# Patient Record
Sex: Female | Born: 1945
Health system: Southern US, Community
[De-identification: ages and names within clinical notes are randomized; demographics above are authoritative.]

## PROBLEM LIST (undated history)

## (undated) DIAGNOSIS — J189 Pneumonia, unspecified organism: Secondary | ICD-10-CM

## (undated) DIAGNOSIS — G709 Myoneural disorder, unspecified: Secondary | ICD-10-CM

## (undated) DIAGNOSIS — M1990 Unspecified osteoarthritis, unspecified site: Secondary | ICD-10-CM

## (undated) DIAGNOSIS — J984 Other disorders of lung: Secondary | ICD-10-CM

## (undated) DIAGNOSIS — R519 Headache, unspecified: Secondary | ICD-10-CM

## (undated) DIAGNOSIS — F419 Anxiety disorder, unspecified: Secondary | ICD-10-CM

## (undated) DIAGNOSIS — Z8489 Family history of other specified conditions: Secondary | ICD-10-CM

## (undated) DIAGNOSIS — F32A Depression, unspecified: Secondary | ICD-10-CM

## (undated) DIAGNOSIS — Z973 Presence of spectacles and contact lenses: Secondary | ICD-10-CM

## (undated) DIAGNOSIS — T8859XA Other complications of anesthesia, initial encounter: Secondary | ICD-10-CM

## (undated) DIAGNOSIS — R51 Headache: Secondary | ICD-10-CM

## (undated) DIAGNOSIS — T7840XA Allergy, unspecified, initial encounter: Secondary | ICD-10-CM

## (undated) DIAGNOSIS — J449 Chronic obstructive pulmonary disease, unspecified: Secondary | ICD-10-CM

## (undated) DIAGNOSIS — S22009A Unspecified fracture of unspecified thoracic vertebra, initial encounter for closed fracture: Secondary | ICD-10-CM

## (undated) DIAGNOSIS — T4145XA Adverse effect of unspecified anesthetic, initial encounter: Secondary | ICD-10-CM

## (undated) DIAGNOSIS — F329 Major depressive disorder, single episode, unspecified: Secondary | ICD-10-CM

## (undated) DIAGNOSIS — K08109 Complete loss of teeth, unspecified cause, unspecified class: Secondary | ICD-10-CM

## (undated) DIAGNOSIS — Z972 Presence of dental prosthetic device (complete) (partial): Secondary | ICD-10-CM

## (undated) DIAGNOSIS — C801 Malignant (primary) neoplasm, unspecified: Secondary | ICD-10-CM

## (undated) HISTORY — DX: Allergy, unspecified, initial encounter: T78.40XA

## (undated) HISTORY — PX: OTHER SURGICAL HISTORY: SHX169

## (undated) HISTORY — DX: Depression, unspecified: F32.A

## (undated) HISTORY — PX: ELBOW SURGERY: SHX618

## (undated) HISTORY — PX: TUBAL LIGATION: SHX77

## (undated) HISTORY — DX: Unspecified osteoarthritis, unspecified site: M19.90

## (undated) HISTORY — DX: Anxiety disorder, unspecified: F41.9

## (undated) HISTORY — DX: Myoneural disorder, unspecified: G70.9

## (undated) HISTORY — PX: MULTIPLE TOOTH EXTRACTIONS: SHX2053

## (undated) HISTORY — DX: Major depressive disorder, single episode, unspecified: F32.9

---

## 1997-08-20 ENCOUNTER — Emergency Department (HOSPITAL_COMMUNITY): Admission: EM | Admit: 1997-08-20 | Discharge: 1997-08-20 | Payer: Self-pay

## 1997-09-21 ENCOUNTER — Encounter: Admission: RE | Admit: 1997-09-21 | Discharge: 1997-12-20 | Payer: Self-pay | Admitting: Specialist

## 1997-10-20 ENCOUNTER — Ambulatory Visit (HOSPITAL_COMMUNITY): Admission: RE | Admit: 1997-10-20 | Discharge: 1997-10-20 | Payer: Self-pay

## 1998-02-15 ENCOUNTER — Ambulatory Visit (HOSPITAL_COMMUNITY): Admission: RE | Admit: 1998-02-15 | Discharge: 1998-02-15 | Payer: Self-pay | Admitting: Specialist

## 1999-06-19 ENCOUNTER — Emergency Department (HOSPITAL_COMMUNITY): Admission: EM | Admit: 1999-06-19 | Discharge: 1999-06-19 | Payer: Self-pay | Admitting: Emergency Medicine

## 2001-10-18 ENCOUNTER — Emergency Department (HOSPITAL_COMMUNITY): Admission: EM | Admit: 2001-10-18 | Discharge: 2001-10-18 | Payer: Self-pay | Admitting: Emergency Medicine

## 2011-07-10 ENCOUNTER — Other Ambulatory Visit: Payer: Self-pay | Admitting: Specialist

## 2011-07-10 ENCOUNTER — Ambulatory Visit
Admission: RE | Admit: 2011-07-10 | Discharge: 2011-07-10 | Disposition: A | Discharge status: Home / Self Care | Payer: Self-pay | Source: Ambulatory Visit | Attending: Specialist | Admitting: Specialist

## 2011-07-10 DIAGNOSIS — W19XXXA Unspecified fall, initial encounter: Secondary | ICD-10-CM

## 2012-04-23 ENCOUNTER — Encounter (HOSPITAL_COMMUNITY): Payer: Self-pay | Admitting: *Deleted

## 2012-04-23 ENCOUNTER — Ambulatory Visit (INDEPENDENT_AMBULATORY_CARE_PROVIDER_SITE_OTHER): Payer: Medicare Other | Admitting: Family Medicine

## 2012-04-23 ENCOUNTER — Ambulatory Visit: Payer: Medicare Other

## 2012-04-23 ENCOUNTER — Emergency Department (HOSPITAL_COMMUNITY)
Admission: EM | Admit: 2012-04-23 | Discharge: 2012-04-23 | Disposition: A | Discharge status: Home / Self Care | Payer: Medicare Other | Attending: Emergency Medicine | Admitting: Emergency Medicine

## 2012-04-23 VITALS — BP 116/71 | HR 125 | Temp 98.6°F | Resp 30 | Ht 61.0 in | Wt 84.0 lb

## 2012-04-23 DIAGNOSIS — J189 Pneumonia, unspecified organism: Secondary | ICD-10-CM | POA: Insufficient documentation

## 2012-04-23 DIAGNOSIS — R6889 Other general symptoms and signs: Secondary | ICD-10-CM | POA: Insufficient documentation

## 2012-04-23 DIAGNOSIS — R05 Cough: Secondary | ICD-10-CM

## 2012-04-23 DIAGNOSIS — J3489 Other specified disorders of nose and nasal sinuses: Secondary | ICD-10-CM | POA: Insufficient documentation

## 2012-04-23 DIAGNOSIS — E876 Hypokalemia: Secondary | ICD-10-CM | POA: Insufficient documentation

## 2012-04-23 DIAGNOSIS — Z8669 Personal history of other diseases of the nervous system and sense organs: Secondary | ICD-10-CM | POA: Insufficient documentation

## 2012-04-23 DIAGNOSIS — Z8659 Personal history of other mental and behavioral disorders: Secondary | ICD-10-CM | POA: Insufficient documentation

## 2012-04-23 DIAGNOSIS — F17200 Nicotine dependence, unspecified, uncomplicated: Secondary | ICD-10-CM | POA: Insufficient documentation

## 2012-04-23 DIAGNOSIS — Z8739 Personal history of other diseases of the musculoskeletal system and connective tissue: Secondary | ICD-10-CM | POA: Insufficient documentation

## 2012-04-23 LAB — POCT I-STAT, CHEM 8
Calcium, Ion: 1.1 mmol/L — ABNORMAL LOW (ref 1.13–1.30)
HCT: 41 % (ref 36.0–46.0)
Hemoglobin: 13.9 g/dL (ref 12.0–15.0)
Sodium: 138 mEq/L (ref 135–145)
TCO2: 30 mmol/L (ref 0–100)

## 2012-04-23 LAB — POCT CBC
Granulocyte percent: 80.4 %G — AB (ref 37–80)
Hemoglobin: 12.7 g/dL (ref 12.2–16.2)
MCH, POC: 28.3 pg (ref 27–31.2)
MCV: 91.9 fL (ref 80–97)
MID (cbc): 0.7 (ref 0–0.9)
MPV: 9.8 fL (ref 0–99.8)
POC MID %: 6.1 %M (ref 0–12)
Platelet Count, POC: 348 10*3/uL (ref 142–424)
RBC: 4.48 M/uL (ref 4.04–5.48)
WBC: 11.7 10*3/uL — AB (ref 4.6–10.2)

## 2012-04-23 MED ORDER — ALBUTEROL SULFATE (2.5 MG/3ML) 0.083% IN NEBU
2.5000 mg | INHALATION_SOLUTION | Freq: Once | RESPIRATORY_TRACT | Status: AC
  Administered 2012-04-23: 2.5 mg via RESPIRATORY_TRACT

## 2012-04-23 MED ORDER — POTASSIUM CHLORIDE ER 10 MEQ PO TBCR: 10.0000 meq | EXTENDED_RELEASE_TABLET | Freq: Two times a day (BID) | ORAL | Status: DC

## 2012-04-23 MED ORDER — AZITHROMYCIN 200 MG/5ML PO SUSR: 200.0000 mg | Freq: Every day | ORAL | Status: DC

## 2012-04-23 MED ORDER — POTASSIUM CHLORIDE CRYS ER 20 MEQ PO TBCR
30.0000 meq | EXTENDED_RELEASE_TABLET | Freq: Once | ORAL | Status: AC
  Administered 2012-04-23: 30 meq via ORAL
  Filled 2012-04-23: qty 2

## 2012-04-23 MED ORDER — AZITHROMYCIN 250 MG PO TABS
500.0000 mg | ORAL_TABLET | Freq: Once | ORAL | Status: AC
  Administered 2012-04-23: 500 mg via ORAL
  Filled 2012-04-23: qty 2

## 2012-04-23 MED ORDER — IPRATROPIUM BROMIDE 0.02 % IN SOLN
0.5000 mg | Freq: Once | RESPIRATORY_TRACT | Status: AC
  Administered 2012-04-23: 0.5 mg via RESPIRATORY_TRACT

## 2012-04-23 NOTE — Addendum Note (Signed)
Addended by: Clydia Llano on: 04/23/2012 08:23 PM   Modules accepted: Orders

## 2012-04-23 NOTE — ED Provider Notes (Signed)
History     CSN: [id number]  Arrival date & time 04/23/12  2029   First MD Initiated Contact with Patient 04/23/12 2135      Chief Complaint  Patient presents with  . Cough  . Pneumonia     HPI Pt states she was sent here by Urgent care d/t possible pneumonia, pt states since last Thursday has had cough, congestion, runny nose and eyes. Pt states coughing up foamy/sticky mucus.no fever chills nausea or vomiting.         Past Medical History  Diagnosis Date  . Allergy   . Arthritis   . Depression   . Neuromuscular disorder   . Anxiety     Past Surgical History  Procedure Laterality Date  . Tubal ligation      Family History  Problem Relation Age of Onset  . Diabetes Mother     History  Substance Use Topics  . Smoking status: Current Every Day Smoker -- 1.00 packs/day for 50 years    Types: Cigarettes  . Smokeless tobacco: Never Used  . Alcohol Use: No    OB History   Grav Para Term Preterm Abortions TAB SAB Ect Mult Living                  Review of Systems  All other systems reviewed and are negative.    Allergies  Penicillins  Home Medications   Current Outpatient Rx  Name  Route  Sig  Dispense  Refill  . carbamide peroxide (EAR DROPS EARWAX AID) 6.5 % otic solution   Both Ears   Place 5 drops into both ears as needed (Cerumen removal).         . diphenhydrAMINE (BENADRYL) 25 MG tablet   Oral   Take 25 mg by mouth every 6 (six) hours as needed for allergies.         Marland Kitchen tetrahydrozoline-zinc (VISINE-AC) 0.05-0.25 % ophthalmic solution   Both Eyes   Place 2 drops into both eyes 3 (three) times daily as needed.         Marland Kitchen azithromycin (ZITHROMAX) 200 MG/5ML suspension   Oral   Take 5 mLs (200 mg total) by mouth daily.   22.5 mL   0   . potassium chloride (K-DUR) 10 MEQ tablet   Oral   Take 1 tablet (10 mEq total) by mouth 2 (two) times daily.   30 tablet   0     BP 112/54  Pulse 113  Temp(Src) 98.1 F (36.7 C) (Oral)   Resp 20  Ht 5\' 1"  (1.549 m)  Wt 84 lb (38.102 kg)  BMI 15.88 kg/m2  SpO2 94%  Physical Exam  Nursing note and vitals reviewed. Constitutional: She is oriented to person, place, and time. She appears well-developed and well-nourished. No distress.  HENT:  Head: Normocephalic and atraumatic.  Eyes: Pupils are equal, round, and reactive to light.  Neck: Normal range of motion.  Cardiovascular: Normal rate and intact distal pulses.   Pulmonary/Chest: Effort normal. No respiratory distress. She has no wheezes (Scattered rhonchi).  Abdominal: Normal appearance. She exhibits no distension.  Musculoskeletal: Normal range of motion.  Neurological: She is alert and oriented to person, place, and time. No cranial nerve deficit.  Skin: Skin is warm and dry. No rash noted.  Psychiatric: She has a normal mood and affect. Her behavior is normal.    ED Course  Procedures (including critical care time)  Date: 04/23/2012  Rate: 103  Rhythm: normal  sinus rhythm  QRS Axis: normal  Intervals: normal  ST/T Wave abnormalities: normal  Conduction Disutrbances: none  Narrative Interpretation: No significant prolonged QT  Meds ordered this encounter  Medications  . potassium chloride (K-DUR) 10 MEQ tablet    Sig: Take 1 tablet (10 mEq total) by mouth 2 (two) times daily.    Dispense:  30 tablet    Refill:  0  . azithromycin (ZITHROMAX) tablet 500 mg    Sig:       Labs Reviewed  POCT I-STAT, CHEM 8 - Abnormal; Notable for the following:    Potassium 2.7 (*)    Glucose, Bld 112 (*)    Calcium, Ion 1.10 (*)    All other components within normal limits   Dg Chest 2 View  04/23/2012  *RADIOLOGY REPORT*  Clinical Data: Fever, cough, and 20 pounds unexplained weight loss.  CHEST - 2 VIEW  Comparison: None.  Findings: There is a vague area of faint density at the right lung base laterally which may represent a small area of infiltrate.  Heart size and vascularity are normal.  The lungs are  hyperinflated and there is diffuse accentuation of the interstitial markings consistent with chronic interstitial and obstructive lung disease. No acute osseous abnormality.  No visible mediastinal or hilar adenopathy.    IMPRESSION:  1.  Small area of possible infiltrate at the right lung base laterally. 2.  Extensive chronic interstitial and obstructive lung disease.  Clinically significant discrepancy from primary report, if provided: None   Original Report Authenticated By: Francene Boyers, M.D.      1. CAP (community acquired pneumonia)   2. Hypokalemia       MDM  CURB 65 score = 1 Discussed with the patient the need for followup and repeat chest x-ray and repeat potassium       Nelia Shi, MD 04/23/12 2311

## 2012-04-23 NOTE — Progress Notes (Signed)
Sherry [name] is a 67 y.o. female who presents to [facility] today for 5 days of cough chills fever. Additionally patient is a 10 pound unintentional weight loss over the past several months. She notes additionally tachypnea. She does not have any chronic medical problems to her knowledge however she is not a frequent doctor goer. She has an extensive over 50 pack year smoking history.  She denies any nausea vomiting or diarrhea and feels well otherwise. She notes that she frequently gets bronchitis and allergy symptoms in the fall and in the spring and she feels that this symptom is consistent with prior episodes of bronchitis.  PMH: Reviewed long-term smoker with unintentional weight loss History  Substance Use Topics  . Smoking status: Current Every Day Smoker -- 1.00 packs/day for 50 years    Types: Cigarettes  . Smokeless tobacco: Never Used  . Alcohol Use: No   ROS as above  Medications reviewed. No current outpatient prescriptions on file.   No current facility-administered medications for this visit.    Exam:  BP 116/71  Pulse 125  Temp(Src) 98.6 F (37 C) (Oral)  Resp 30  Ht 5\' 1"  (1.549 m)  Wt 84 lb (38.102 kg)  BMI 15.88 kg/m2  SpO2 94% Gen: Well NAD HEENT: EOMI,  MMM Lungs: Increased work of breathing and respiratory rate. Rales in the right lower lung rhonchi bilaterally.  Heart: Regular but tachycardic rate no MRG Abd: NABS, NT, ND Exts: Non edematous BL  LE, warm and well perfused.   Following albuterol and Atrovent nebulizer treatment: Lungs continue to have coarseness with Rales in the right lower lung. Oxygen saturation remained in the low 90s.   Results for orders placed in visit on 04/23/12 (from the past 72 hour(s))  POCT CBC     Status: Abnormal   Collection Time    04/23/12  7:38 PM      Result Value Range   WBC 11.7 (*) 4.6 - 10.2 K/uL   Lymph, poc 1.6  0.6 - 3.4   POC LYMPH PERCENT 13.5  10 - 50 %L   MID (cbc) 0.7  0 - 0.9   POC MID % 6.1  0 - 12 %M   POC Granulocyte 9.8 (*) 2 - 6.9   Granulocyte percent 80.4 (*) 37 - 80 %G   RBC 4.48  4.04 - 5.48 M/uL   Hemoglobin 12.7  12.2 - 16.2 g/dL   HCT, POC 16.1  09.6 - 47.9 %   MCV 91.9  80 - 97 fL   MCH, POC 28.3  27 - 31.2 pg   MCHC 30.8 (*) 31.8 - 35.4 g/dL   RDW, POC 04.5     Platelet Count, POC 348  142 - 424 K/uL   MPV 9.8  0 - 99.8 fL    X-ray ordered and preliminary results by me: Diffuse interstitial changes consistent with COPD.  Increased lung markings likely infiltrate in the right lower lung.  No masses noted  Assessment and Plan: 67 y.o. female with history of undiagnosed COPD with a 10 pound unintentional weight loss fever cough chills tachypnea and tachycardia. X-ray shows right lower lobe infiltrate and CBC shows mildly increased white count. Patient clinically has pneumonia in the setting of COPD. I'm concerned about occult malignancy. We'll transfer patient to the emergency room for further evaluation and management of her pneumonia and she is at higher risk due to her underlying health issues.  Discussed the plan with the patient and her  sister who expressed understanding and agreement. They will go directly to the emergency room.

## 2012-04-23 NOTE — ED Notes (Signed)
Pt states she was sent here by Urgent care d/t possible pneumonia, pt states since last Thursday has had cough, congestion, runny nose and eyes. Pt states coughing up foamy/sticky mucus.

## 2012-04-23 NOTE — Patient Instructions (Addendum)
Thank you for coming in today. Please go directly to the emergency room at [facility].  We are calling ahead about you.  I am worried that you have pneumonia.

## 2012-04-29 ENCOUNTER — Encounter (HOSPITAL_COMMUNITY): Payer: Self-pay | Admitting: *Deleted

## 2012-04-29 ENCOUNTER — Emergency Department (HOSPITAL_COMMUNITY): Payer: Medicare Other

## 2012-04-29 ENCOUNTER — Emergency Department (HOSPITAL_COMMUNITY)
Admission: EM | Admit: 2012-04-29 | Discharge: 2012-04-29 | Disposition: A | Discharge status: Home / Self Care | Payer: Medicare Other | Attending: Emergency Medicine | Admitting: Emergency Medicine

## 2012-04-29 DIAGNOSIS — J449 Chronic obstructive pulmonary disease, unspecified: Secondary | ICD-10-CM | POA: Insufficient documentation

## 2012-04-29 DIAGNOSIS — Z8739 Personal history of other diseases of the musculoskeletal system and connective tissue: Secondary | ICD-10-CM | POA: Insufficient documentation

## 2012-04-29 DIAGNOSIS — F17200 Nicotine dependence, unspecified, uncomplicated: Secondary | ICD-10-CM | POA: Insufficient documentation

## 2012-04-29 DIAGNOSIS — Z8659 Personal history of other mental and behavioral disorders: Secondary | ICD-10-CM | POA: Insufficient documentation

## 2012-04-29 DIAGNOSIS — J4489 Other specified chronic obstructive pulmonary disease: Secondary | ICD-10-CM | POA: Insufficient documentation

## 2012-04-29 DIAGNOSIS — Z8669 Personal history of other diseases of the nervous system and sense organs: Secondary | ICD-10-CM | POA: Insufficient documentation

## 2012-04-29 LAB — BASIC METABOLIC PANEL
Calcium: 9.4 mg/dL (ref 8.4–10.5)
Creatinine, Ser: 0.68 mg/dL (ref 0.50–1.10)
GFR calc Af Amer: >90 mL/min (ref >90)
GFR calc non Af Amer: 89 mL/min — ABNORMAL LOW (ref >90)

## 2012-04-29 NOTE — ED Provider Notes (Signed)
History    This chart was scribed for non-physician practitioner Wynetta Emery, PA-C working with Celene Kras, MD by Gerlean Ren, ED Scribe. This patient was seen in room Sherry [name]/Sherry [name] and the patient's care was started at 6:51 PM.    CSN: [id number]  Arrival date & time 04/29/12  1656   First MD Initiated Contact with Patient 04/29/12 1756      Chief Complaint  Patient presents with  . Follow-up    The history is provided by the patient. No language interpreter was used.  Sherry [name] is a 67 y.o. female who presents to the Emergency Department for a re-check of pneumonia diagnosed 04/15 when she was also found to have low potassium.  Pt reports she is feeling significantly improved compared to last visit with improved cough that is still productive of white phlegm.  Pt denies any current chest pain, dyspnea, wheezes, nausea, emesis.  Pt completed her antibiotic treatment yesterday.  Pt reports some non-bloody diarrhea over the past 2 days that she thinks may be due to antibiotics.  She finished her Z-Pak yesterday. Pt reports she has not smoked since being diagnosed and that she has quit.    Past Medical History  Diagnosis Date  . Allergy   . Arthritis   . Depression   . Neuromuscular disorder   . Anxiety     Past Surgical History  Procedure Laterality Date  . Tubal ligation      Family History  Problem Relation Age of Onset  . Diabetes Mother     History  Substance Use Topics  . Smoking status: Current Every Day Smoker -- 1.00 packs/day for 50 years    Types: Cigarettes  . Smokeless tobacco: Never Used  . Alcohol Use: No    No OB history provided.   Review of Systems  Constitutional: Negative for fever.  Respiratory: Positive for cough. Negative for shortness of breath.   Cardiovascular: Negative for chest pain.  Gastrointestinal: Positive for diarrhea (resolved). Negative for nausea, vomiting and abdominal pain.  All other systems reviewed and are  negative.    Allergies  Penicillins  Home Medications   Current Outpatient Rx  Name  Route  Sig  Dispense  Refill  . potassium chloride (K-DUR) 10 MEQ tablet   Oral   Take 10 mEq by mouth 2 (two) times daily.         Marland Kitchen tetrahydrozoline-zinc (VISINE-AC) 0.05-0.25 % ophthalmic solution   Both Eyes   Place 2 drops into both eyes 3 (three) times daily as needed (dry eyes/ red eyes).            BP 111/44  Pulse 65  Temp(Src) 98.3 F (36.8 C) (Oral)  Resp 18  Ht 5\' 1"  (1.549 m)  Wt 84 lb (38.102 kg)  BMI 15.88 kg/m2  SpO2 99%  Physical Exam  Nursing note and vitals reviewed. Constitutional: She is oriented to person, place, and time. She appears well-developed and well-nourished. No distress.  HENT:  Head: Normocephalic.  Mouth/Throat: Oropharynx is clear and moist.  Eyes: Conjunctivae and EOM are normal. Pupils are equal, round, and reactive to light.  Neck: Normal range of motion.  Cardiovascular: Normal rate, regular rhythm and intact distal pulses.   Pulmonary/Chest: Effort normal. No stridor. No respiratory distress. She has wheezes. She has no rales. She exhibits no tenderness.  Mild scattered expiratory wheezing, prolonged exhalation  Abdominal: Soft. Bowel sounds are normal. She exhibits no distension and no mass. There is no  tenderness. There is no rebound and no guarding.  Musculoskeletal: Normal range of motion.  Neurological: She is alert and oriented to person, place, and time.  Psychiatric: She has a normal mood and affect.    ED Course  Procedures (including critical care time) DIAGNOSTIC STUDIES: Oxygen Saturation is 99% on room air, normal by my interpretation.    COORDINATION OF CARE: 7:00 PM- Patient informed of clinical course, understands medical decision-making process, and agrees with plan.    Dg Chest 2 View  04/29/2012  *RADIOLOGY REPORT*  Clinical Data: Cough.  Shortness of breath.  Pneumonia.  CHEST - 2 VIEW  Comparison: 04/23/2012   Findings: There is mild persistent infiltrate in the lateral right lung base which shows no significant change since recent study.  The left lung is clear.  No evidence of pleural effusion. Pulmonary hyperinflation is seen, consistent with COPD.  Heart size is normal.  No mass or lymphadenopathy identified.  IMPRESSION:  1.  No significant change in mild infiltrate and lateral right lung base, suspicious for pneumonia.  Continued radiographic followup is recommended to confirm resolution. 2.  COPD.   Original Report Authenticated By: Myles Rosenthal, M.D.      1. COPD (chronic obstructive pulmonary disease)       MDM   [name] is a 67 y.o. female presenting for repeat chest x-ray and repeat potassium.   Interval be met shows normalization of potassium of 4.3. Chest x-ray does show no change in the infiltrate noted in the right lung. I discussed this with attending Dr. Lynelle Doctor who feels that she is appropriate for discharge and that the clinical scenario of significant subjective improvement, completion of antibiotic course is reassuring and that the x-ray will lack in resolution of the pneumonia.  Advise patient is critically important that she establish primary care for management of her chronic issue such as COPD. Advised to return to the emergency room for any change in cough or sputum production, fever, shortness of breath, abdominal pain, nausea vomiting.  Filed Vitals:   04/29/12 1725  BP: 111/44  Pulse: 65  Temp: 98.3 F (36.8 C)  TempSrc: Oral  Resp: 18  Height: 5\' 1"  (1.549 m)  Weight: 84 lb (38.102 kg)  SpO2: 99%     Pt verbalized understanding and agrees with care plan. Outpatient follow-up and return precautions given.    I personally performed the services described in this documentation, which was scribed in my presence. The recorded information has been reviewed and is accurate.     Wynetta Emery, PA-C 04/30/12 0147

## 2012-04-29 NOTE — ED Notes (Signed)
Pt states was here last Tuesday, told R sided pna and low potassium, given potassium pills and azithromycin, was told to come back for a follow up to check her potassium level. Pt states feeling better.

## 2012-04-30 NOTE — ED Provider Notes (Signed)
Medical screening examination/treatment/procedure(s) were performed by non-physician practitioner and as supervising physician I was immediately available for consultation/collaboration.    Celene Kras, MD 04/30/12 [phone]

## 2012-05-28 DIAGNOSIS — G43909 Migraine, unspecified, not intractable, without status migrainosus: Secondary | ICD-10-CM | POA: Insufficient documentation

## 2012-05-28 DIAGNOSIS — F329 Major depressive disorder, single episode, unspecified: Secondary | ICD-10-CM | POA: Insufficient documentation

## 2012-09-20 DIAGNOSIS — M810 Age-related osteoporosis without current pathological fracture: Secondary | ICD-10-CM | POA: Insufficient documentation

## 2013-02-28 ENCOUNTER — Ambulatory Visit
Admission: RE | Admit: 2013-02-28 | Discharge: 2013-02-28 | Disposition: A | Discharge status: Home / Self Care | Payer: Medicare Other | Source: Ambulatory Visit | Attending: Nurse Practitioner | Admitting: Nurse Practitioner

## 2013-02-28 ENCOUNTER — Other Ambulatory Visit: Payer: Self-pay | Admitting: Nurse Practitioner

## 2013-02-28 DIAGNOSIS — R05 Cough: Secondary | ICD-10-CM

## 2013-02-28 DIAGNOSIS — R059 Cough, unspecified: Secondary | ICD-10-CM

## 2013-03-10 DIAGNOSIS — J441 Chronic obstructive pulmonary disease with (acute) exacerbation: Secondary | ICD-10-CM | POA: Insufficient documentation

## 2013-03-10 DIAGNOSIS — J449 Chronic obstructive pulmonary disease, unspecified: Secondary | ICD-10-CM | POA: Insufficient documentation

## 2013-12-12 ENCOUNTER — Encounter: Payer: Self-pay | Admitting: Gastroenterology

## 2014-01-06 ENCOUNTER — Institutional Professional Consult (permissible substitution): Payer: Medicare Other | Admitting: Internal Medicine

## 2014-02-13 ENCOUNTER — Encounter: Payer: Medicare Other | Admitting: Gastroenterology

## 2014-03-26 ENCOUNTER — Encounter: Payer: Self-pay | Admitting: Nurse Practitioner

## 2015-02-25 DIAGNOSIS — H25011 Cortical age-related cataract, right eye: Secondary | ICD-10-CM | POA: Diagnosis not present

## 2015-02-25 DIAGNOSIS — H2512 Age-related nuclear cataract, left eye: Secondary | ICD-10-CM | POA: Diagnosis not present

## 2015-02-25 DIAGNOSIS — H2511 Age-related nuclear cataract, right eye: Secondary | ICD-10-CM | POA: Diagnosis not present

## 2015-02-25 DIAGNOSIS — H25012 Cortical age-related cataract, left eye: Secondary | ICD-10-CM | POA: Diagnosis not present

## 2015-10-22 ENCOUNTER — Ambulatory Visit
Admission: RE | Admit: 2015-10-22 | Discharge: 2015-10-22 | Disposition: A | Discharge status: Home / Self Care | Payer: Medicaid Other | Source: Ambulatory Visit | Attending: Radiation Oncology | Admitting: Radiation Oncology

## 2015-10-22 NOTE — Progress Notes (Addendum)
Error wrong chart

## 2015-12-17 DIAGNOSIS — Z0000 Encounter for general adult medical examination without abnormal findings: Secondary | ICD-10-CM | POA: Diagnosis not present

## 2015-12-17 DIAGNOSIS — R636 Underweight: Secondary | ICD-10-CM | POA: Diagnosis not present

## 2015-12-17 DIAGNOSIS — J42 Unspecified chronic bronchitis: Secondary | ICD-10-CM | POA: Diagnosis not present

## 2015-12-17 DIAGNOSIS — M810 Age-related osteoporosis without current pathological fracture: Secondary | ICD-10-CM | POA: Diagnosis not present

## 2015-12-17 DIAGNOSIS — Z1231 Encounter for screening mammogram for malignant neoplasm of breast: Secondary | ICD-10-CM | POA: Diagnosis not present

## 2015-12-17 DIAGNOSIS — Z1159 Encounter for screening for other viral diseases: Secondary | ICD-10-CM | POA: Diagnosis not present

## 2015-12-17 DIAGNOSIS — B351 Tinea unguium: Secondary | ICD-10-CM | POA: Diagnosis not present

## 2015-12-17 DIAGNOSIS — F17200 Nicotine dependence, unspecified, uncomplicated: Secondary | ICD-10-CM | POA: Diagnosis not present

## 2015-12-29 ENCOUNTER — Ambulatory Visit (INDEPENDENT_AMBULATORY_CARE_PROVIDER_SITE_OTHER): Payer: PPO | Admitting: Physician Assistant

## 2015-12-29 DIAGNOSIS — M62838 Other muscle spasm: Secondary | ICD-10-CM

## 2015-12-29 DIAGNOSIS — M542 Cervicalgia: Secondary | ICD-10-CM

## 2015-12-29 DIAGNOSIS — R0789 Other chest pain: Secondary | ICD-10-CM

## 2015-12-29 MED ORDER — MELOXICAM 7.5 MG PO TABS: 7.5000 mg | ORAL_TABLET | Freq: Every day | ORAL | 0 refills | Status: DC
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
## 2015-12-29 NOTE — Patient Instructions (Addendum)
  Heat to the area Medications as needed  Take good deep breaths to prevent penumonia  IF you received an x-ray today, you will receive an invoice from [facility]. Please contact [facility] at [phone] with questions or concerns regarding your invoice.   IF you received labwork today, you will receive an invoice from [facility]. Please contact [facility] at [phone] with questions or concerns regarding your invoice.   Our billing staff will not be able to assist you with questions regarding bills from these companies.  You will be contacted with the lab results as soon as they are available. The fastest way to get your results is to activate your My Chart account. Instructions are located on the last page of this paperwork. If you have not heard from [place] regarding the results in 2 weeks, please contact this office.

## 2015-12-30 ENCOUNTER — Other Ambulatory Visit: Payer: Self-pay | Admitting: Nurse Practitioner

## 2015-12-30 DIAGNOSIS — M810 Age-related osteoporosis without current pathological fracture: Secondary | ICD-10-CM

## 2015-12-30 DIAGNOSIS — Z1231 Encounter for screening mammogram for malignant neoplasm of breast: Secondary | ICD-10-CM

## 2016-01-14 ENCOUNTER — Other Ambulatory Visit (HOSPITAL_COMMUNITY): Payer: Self-pay | Admitting: Respiratory Therapy

## 2016-01-14 DIAGNOSIS — J441 Chronic obstructive pulmonary disease with (acute) exacerbation: Secondary | ICD-10-CM

## 2017-06-01 DIAGNOSIS — M810 Age-related osteoporosis without current pathological fracture: Secondary | ICD-10-CM | POA: Diagnosis not present

## 2017-06-01 DIAGNOSIS — J410 Simple chronic bronchitis: Secondary | ICD-10-CM | POA: Diagnosis not present

## 2017-06-01 DIAGNOSIS — F17210 Nicotine dependence, cigarettes, uncomplicated: Secondary | ICD-10-CM | POA: Diagnosis not present

## 2017-06-01 DIAGNOSIS — R636 Underweight: Secondary | ICD-10-CM | POA: Diagnosis not present

## 2017-06-01 DIAGNOSIS — F4323 Adjustment disorder with mixed anxiety and depressed mood: Secondary | ICD-10-CM | POA: Diagnosis not present

## 2017-06-01 DIAGNOSIS — E538 Deficiency of other specified B group vitamins: Secondary | ICD-10-CM | POA: Diagnosis not present

## 2017-06-01 DIAGNOSIS — Z0000 Encounter for general adult medical examination without abnormal findings: Secondary | ICD-10-CM | POA: Diagnosis not present

## 2017-06-08 DIAGNOSIS — R636 Underweight: Secondary | ICD-10-CM | POA: Diagnosis not present

## 2017-06-08 DIAGNOSIS — E538 Deficiency of other specified B group vitamins: Secondary | ICD-10-CM | POA: Diagnosis not present

## 2017-06-08 DIAGNOSIS — F17210 Nicotine dependence, cigarettes, uncomplicated: Secondary | ICD-10-CM | POA: Diagnosis not present

## 2017-06-08 DIAGNOSIS — F17200 Nicotine dependence, unspecified, uncomplicated: Secondary | ICD-10-CM | POA: Diagnosis not present

## 2017-06-08 DIAGNOSIS — R05 Cough: Secondary | ICD-10-CM | POA: Diagnosis not present

## 2017-06-11 ENCOUNTER — Other Ambulatory Visit: Payer: Self-pay | Admitting: Nurse Practitioner

## 2017-06-11 ENCOUNTER — Other Ambulatory Visit (HOSPITAL_COMMUNITY): Payer: Self-pay | Admitting: Nurse Practitioner

## 2017-06-11 DIAGNOSIS — R918 Other nonspecific abnormal finding of lung field: Secondary | ICD-10-CM

## 2017-06-11 DIAGNOSIS — R634 Abnormal weight loss: Secondary | ICD-10-CM

## 2017-06-13 ENCOUNTER — Ambulatory Visit (HOSPITAL_COMMUNITY): Admission: RE | Admit: 2017-06-13 | Payer: PPO | Source: Ambulatory Visit

## 2017-06-13 ENCOUNTER — Other Ambulatory Visit: Payer: Self-pay | Admitting: Nurse Practitioner

## 2017-06-13 DIAGNOSIS — R634 Abnormal weight loss: Secondary | ICD-10-CM

## 2017-06-13 DIAGNOSIS — R918 Other nonspecific abnormal finding of lung field: Secondary | ICD-10-CM

## 2017-06-21 ENCOUNTER — Other Ambulatory Visit: Payer: Self-pay | Admitting: Nurse Practitioner

## 2017-06-21 DIAGNOSIS — R634 Abnormal weight loss: Secondary | ICD-10-CM

## 2017-06-21 DIAGNOSIS — R918 Other nonspecific abnormal finding of lung field: Secondary | ICD-10-CM

## 2017-06-22 DIAGNOSIS — E538 Deficiency of other specified B group vitamins: Secondary | ICD-10-CM | POA: Diagnosis not present

## 2017-07-02 DIAGNOSIS — Z1211 Encounter for screening for malignant neoplasm of colon: Secondary | ICD-10-CM | POA: Diagnosis not present

## 2017-07-02 DIAGNOSIS — Z1212 Encounter for screening for malignant neoplasm of rectum: Secondary | ICD-10-CM | POA: Diagnosis not present

## 2017-07-06 DIAGNOSIS — E538 Deficiency of other specified B group vitamins: Secondary | ICD-10-CM | POA: Diagnosis not present

## 2017-07-06 DIAGNOSIS — R7989 Other specified abnormal findings of blood chemistry: Secondary | ICD-10-CM | POA: Diagnosis not present

## 2017-07-06 DIAGNOSIS — K146 Glossodynia: Secondary | ICD-10-CM | POA: Diagnosis not present

## 2017-07-06 DIAGNOSIS — R634 Abnormal weight loss: Secondary | ICD-10-CM | POA: Diagnosis not present

## 2017-07-13 ENCOUNTER — Ambulatory Visit
Admission: RE | Admit: 2017-07-13 | Discharge: 2017-07-13 | Disposition: A | Discharge status: Home / Self Care | Payer: PPO | Source: Ambulatory Visit | Attending: Nurse Practitioner | Admitting: Nurse Practitioner

## 2017-07-13 DIAGNOSIS — J439 Emphysema, unspecified: Secondary | ICD-10-CM | POA: Diagnosis not present

## 2017-07-13 DIAGNOSIS — R918 Other nonspecific abnormal finding of lung field: Secondary | ICD-10-CM

## 2017-07-13 DIAGNOSIS — R634 Abnormal weight loss: Secondary | ICD-10-CM

## 2017-07-13 MED ORDER — IOPAMIDOL (ISOVUE-300) INJECTION 61%
75.0000 mL | Freq: Once | INTRAVENOUS | Status: AC | PRN
  Administered 2017-07-13: 75 mL via INTRAVENOUS

## 2017-07-20 DIAGNOSIS — E538 Deficiency of other specified B group vitamins: Secondary | ICD-10-CM | POA: Diagnosis not present

## 2017-07-23 DIAGNOSIS — R918 Other nonspecific abnormal finding of lung field: Secondary | ICD-10-CM | POA: Diagnosis not present

## 2017-07-24 ENCOUNTER — Other Ambulatory Visit (HOSPITAL_COMMUNITY): Payer: Self-pay | Admitting: Nurse Practitioner

## 2017-07-24 DIAGNOSIS — R918 Other nonspecific abnormal finding of lung field: Secondary | ICD-10-CM

## 2017-08-03 ENCOUNTER — Ambulatory Visit (HOSPITAL_COMMUNITY)
Admission: RE | Admit: 2017-08-03 | Discharge: 2017-08-03 | Disposition: A | Discharge status: Home / Self Care | Payer: PPO | Source: Ambulatory Visit | Attending: Nurse Practitioner | Admitting: Nurse Practitioner

## 2017-08-03 ENCOUNTER — Other Ambulatory Visit (HOSPITAL_COMMUNITY): Payer: Self-pay | Admitting: Nurse Practitioner

## 2017-08-03 DIAGNOSIS — R918 Other nonspecific abnormal finding of lung field: Secondary | ICD-10-CM

## 2017-08-03 DIAGNOSIS — J439 Emphysema, unspecified: Secondary | ICD-10-CM | POA: Diagnosis not present

## 2017-08-03 LAB — GLUCOSE, CAPILLARY: Glucose-Capillary: 100 mg/dL — ABNORMAL HIGH (ref 70–99)

## 2017-08-03 MED ORDER — FLUDEOXYGLUCOSE F - 18 (FDG) INJECTION
5.8000 | Freq: Once | INTRAVENOUS | Status: AC | PRN
  Administered 2017-08-03: 5.8 via INTRAVENOUS

## 2017-08-13 ENCOUNTER — Encounter: Payer: Self-pay | Admitting: *Deleted

## 2017-08-13 ENCOUNTER — Telehealth: Payer: Self-pay | Admitting: *Deleted

## 2017-08-13 NOTE — Progress Notes (Signed)
Oncology Nurse Navigator Documentation  Oncology Nurse Navigator Flowsheets 08/13/2017  Navigator Location [facility]  Navigator Encounter Type Telephone/I received referral on Ms. Lyne.  I updated Dr. Julien Nordmann on referral.  He states patient needs to see t surgery at clinic. I called her and gave her the appt to be seen on 08/23/17.  She verbalized understanding of appt time and place.   Telephone Incoming Call  Treatment Phase Abnormal Scans  Barriers/Navigation Needs Education;Coordination of Care  Education Other  Interventions Coordination of Care;Education  Coordination of Care Other  Education Method Verbal  Acuity Level 2  Time Spent with Patient 30

## 2017-08-13 NOTE — Telephone Encounter (Signed)
Oncology Nurse Navigator Documentation  Oncology Nurse Navigator Flowsheets 08/13/2017  Navigator Location [facility]  Referral date to RadOnc/MedOnc 08/13/2017  Navigator Encounter Type Telephone/I received referral on Sherry [name]. I updated Dr. Julien Nordmann on referral and states patient needs to see T surgery. I called patient to update her on appt for clinic.  I was unable to reach but did leave vm message with my name and phone number.   Telephone Outgoing Call  Treatment Phase Abnormal Scans  Barriers/Navigation Needs Coordination of Care  Interventions Coordination of Care  Coordination of Care Other  Acuity Level 2  Time Spent with Patient 15

## 2017-08-22 ENCOUNTER — Telehealth: Payer: Self-pay | Admitting: Internal Medicine

## 2017-08-22 NOTE — Telephone Encounter (Signed)
LVM TO CONFIRM appointment for lung clinic on 8/15

## 2017-08-23 ENCOUNTER — Encounter: Payer: Self-pay | Admitting: *Deleted

## 2017-08-23 ENCOUNTER — Ambulatory Visit
Admission: RE | Admit: 2017-08-23 | Discharge: 2017-08-23 | Disposition: A | Discharge status: Home / Self Care | Payer: PPO | Source: Ambulatory Visit | Attending: Radiation Oncology | Admitting: Radiation Oncology

## 2017-08-23 ENCOUNTER — Ambulatory Visit (INDEPENDENT_AMBULATORY_CARE_PROVIDER_SITE_OTHER): Payer: PPO | Admitting: Thoracic Surgery (Cardiothoracic Vascular Surgery)

## 2017-08-23 ENCOUNTER — Encounter: Payer: Self-pay | Admitting: Thoracic Surgery (Cardiothoracic Vascular Surgery)

## 2017-08-23 VITALS — BP 117/72 | HR 108 | Temp 99.1°F | Resp 18 | Wt 87.7 lb

## 2017-08-23 DIAGNOSIS — G64 Other disorders of peripheral nervous system: Secondary | ICD-10-CM | POA: Diagnosis not present

## 2017-08-23 DIAGNOSIS — F17210 Nicotine dependence, cigarettes, uncomplicated: Secondary | ICD-10-CM | POA: Diagnosis not present

## 2017-08-23 DIAGNOSIS — J9859 Other diseases of mediastinum, not elsewhere classified: Secondary | ICD-10-CM | POA: Insufficient documentation

## 2017-08-23 DIAGNOSIS — R918 Other nonspecific abnormal finding of lung field: Secondary | ICD-10-CM

## 2017-08-23 DIAGNOSIS — C3432 Malignant neoplasm of lower lobe, left bronchus or lung: Secondary | ICD-10-CM

## 2017-08-23 NOTE — Progress Notes (Signed)
PCP is [name], NP Referring Provider is [name], NP   HPI: 72 yo woman sent for consultation re: left lower lobe lung mass  72 yo woman with a 44 py history of tobacco abuse, COPD, arthritis, depression, anxiety and a "neuromuscular disorder." She presented to her PCP with a persistent cough and weight loss in June. She says she lost about 15 pounds last year due to family troubles and poor appetite. Has lost an additional 2 pounds over past 3 months. A CXR showed a left lower lobe mass. A CT chest 07/13/2017 showed a 7 cm LLL mass and a 7 mm RLL nodule. PET CT was done 7/26. The LLL mass was hypermetabolic. No hilar or mediastinal adenopathy.  She sometimes is short of breath just walking to the bathroom. Other times she can carry her garbage out without any problem. She has had frequent episodes of bronchitis but isn't sure she has COPD. Zubrod Score: At the time of surgery this patient's most appropriate activity status/level should be described as: []     0    Normal activity, no symptoms [x]     1    Restricted in physical strenuous activity but ambulatory, able to do out light work []     2    Ambulatory and capable of self care, unable to do work activities, up and about >50 % of waking hours                              []     3    Only limited self care, in bed greater than 50% of waking hours []     4    Completely disabled, no self care, confined to bed or chair []     5    Moribund  Past Medical History:  Diagnosis Date  . Allergy   . Anxiety   . Arthritis   . Depression   . Neuromuscular disorder ([place])   Severe protein calorie malnutrition  Past Surgical History:  Procedure Laterality Date  . TUBAL LIGATION      Family History  Problem Relation Age of Onset  . Diabetes Mother     Social History Social History   Tobacco Use  . Smoking status: Current Every Day Smoker    Packs/day: 1.00    Years: 50.00    Pack years: 50.00    Types: Cigarettes  .  Smokeless tobacco: Never Used  Substance Use Topics  . Alcohol use: No  . Drug use: No    Current Outpatient Medications  Medication Sig Dispense Refill  . ASPIRIN PO Take by mouth.    . meloxicam (MOBIC) 7.5 MG tablet Take 1 tablet (7.5 mg total) by mouth daily. 15 tablet 0   No current facility-administered medications for this visit.     Allergies  Allergen Reactions  . Penicillins Hives    Review of Systems  Constitutional: Positive for activity change, appetite change, fatigue and unexpected weight change.  HENT: Positive for dental problem (dentures). Negative for trouble swallowing and voice change.   Eyes: Negative for visual disturbance.  Respiratory: Positive for cough and shortness of breath. Negative for wheezing.   Cardiovascular: Negative for chest pain.  Genitourinary: Negative for difficulty urinating and dysuria.  Musculoskeletal: Negative for arthralgias and myalgias.  Neurological: Positive for headaches. Negative for seizures.  Hematological: Negative for adenopathy. Does not bruise/bleed easily.  Psychiatric/Behavioral: The patient is nervous/anxious.  BP 117/72   Pulse (!) 108   Temp 99.1 F (37.3 C)   Resp 18   Wt 87 lb 11.2 oz (39.8 kg)   SpO2 93%   BMI 16.57 kg/m  Physical Exam  Constitutional: She is oriented to person, place, and time. No distress.  Cachectic 72 yo woman in NAD  HENT:  Head: Normocephalic and atraumatic.  Temporal wasting  Eyes: Conjunctivae and EOM are normal. No scleral icterus.  Neck: No thyromegaly present.  Cardiovascular: Regular rhythm and normal heart sounds. Exam reveals no gallop and no friction rub.  No murmur heard. tachycardic  Pulmonary/Chest: Effort normal. No respiratory distress. She has no wheezes. She has no rales.  Diminished BS bilaterally  Abdominal: Soft. She exhibits no distension. There is no tenderness.  Musculoskeletal: She exhibits deformity (thenar wasting). She exhibits no edema.   Lymphadenopathy:    She has no cervical adenopathy.  Neurological: She is alert and oriented to person, place, and time. No cranial nerve deficit. She exhibits normal muscle tone. Coordination normal.  Skin: Skin is warm and dry.  Vitals reviewed.    Diagnostic Tests: NUCLEAR MEDICINE PET SKULL BASE TO THIGH  TECHNIQUE: 5.8 mCi F-18 FDG was injected intravenously. Full-ring PET imaging was performed from the skull base to thigh after the radiotracer. CT data was obtained and used for attenuation correction and anatomic localization.  Fasting blood glucose: 100 mg/dl  COMPARISON:  CT 07/13/2017  FINDINGS: Mediastinal blood pool activity: SUV max 1.9  NECK: No hypermetabolic lymph nodes in the neck.  Incidental CT findings: none  CHEST: Hypermetabolic mass in the LEFT lower lobe extends from the hilum inferomedially along the pleural surface measuring 5.1 x 7.0 cm. Mass is intensely hypermetabolic with SUV max equal 21.2  No clear hypermetabolic mediastinal lymph nodes.  Rounded nodule in the RIGHT lower lobe measuring 6 mm and does not have associated metabolic activity.  Incidental CT findings: Extensive upper lobe emphysema  ABDOMEN/PELVIS: No abnormal hypermetabolic activity within the liver, pancreas, adrenal glands, or spleen. No hypermetabolic lymph nodes in the abdomen or pelvis.  Incidental CT findings: Atherosclerotic calcification of the aorta. Moderate large volume stool in the rectum.  SKELETON: No focal hypermetabolic activity to suggest skeletal metastasis.  Incidental CT findings: none  IMPRESSION: 1. Hypermetabolic LEFT lower lobe mass abutting the pleural surface is consistent with bronchogenic carcinoma. 2. No evidence of hypermetabolic mediastinal adenopathy. 3. Rounded nodule in the RIGHT lower lobe does not have associated metabolic activity therefore is favored benign. Recommend attention on follow-up. 4. No evidence  distant metastatic disease.   Electronically Signed   By: Suzy Bouchard M.D.   On: 08/03/2017 16:39 I personally reviewed the PET CT images and concur with the findings noted above.  Impression: 72 yo woman with a 50 py histroy of smoking and severe emphysema who presents with cough and weight loss. Weight loss has been going on for almost a year and she shows signs of severe protein calorie malnutrition. Workup revealed a 7 cm mass in the left lower lobe. This is almost certainly a new primary bronchogenic carcinoma, but we need a biopsy to definitively establish the diagnosis and guide therapy. This should be accessible with bronchoscopy. Will plan to do under general anesthesia to give [place] time and airway control to get good samples.  There is nothing to suggest this is TB  She is not a candidate for surgical resection.  I discussed the proposed procedure of bronchoscopy for biopsy with Ms.  Armandina Gemma. She understands we would do the procedure in the OR under GA. I informed her of the indications, risks, benefits and alternatives. She understands the risks include those associated with GA. She understands the risks include but are not limited to death, MI, DVT, PE, bleeding, pneumothorax and nondiagnostic biopsies, as well as the possibility of other unforeseeable complications.  Offered Monday 8/19 for biopsy but she declined and wanted to wait until 8/23  Plan: Bronchoscopy for biopsy on Friday 8/23  Melrose Nakayama, MD [facility] [phone]

## 2017-08-23 NOTE — Progress Notes (Signed)
Oncology Nurse Navigator Documentation  Oncology Nurse Navigator Flowsheets 08/23/2017  Navigator Location [facility]  Navigator Encounter Type Clinic/MDC/I spoke with patient and family today at thoracic clinic.  I help to explain next steps.  Patient wanted to cancel appt with pulmonary.  I called their office and they cancelled appt  Abnormal Finding Date 07/13/2017  Multidisiplinary Clinic Date 08/23/2017  Patient Visit Type MedOnc  Treatment Phase Abnormal Scans  Barriers/Navigation Needs Education;Coordination of Care  Education Other  Interventions Coordination of Care;Education  Coordination of Care Other  Education Method Verbal  Acuity Level 3  Time Spent with Patient 20

## 2017-08-23 NOTE — H&P (View-Only) (Signed)
PCP is [name], NP Referring Provider is [name], NP   HPI: 72 yo woman sent for consultation re: left lower lobe lung mass  72 yo woman with a 68 py history of tobacco abuse, COPD, arthritis, depression, anxiety and a "neuromuscular disorder." She presented to her PCP with a persistent cough and weight loss in June. She says she lost about 15 pounds last year due to family troubles and poor appetite. Has lost an additional 2 pounds over past 3 months. A CXR showed a left lower lobe mass. A CT chest 07/13/2017 showed a 7 cm LLL mass and a 7 mm RLL nodule. PET CT was done 7/26. The LLL mass was hypermetabolic. No hilar or mediastinal adenopathy.  She sometimes is short of breath just walking to the bathroom. Other times she can carry her garbage out without any problem. She has had frequent episodes of bronchitis but isn't sure she has COPD. Zubrod Score: At the time of surgery this patient's most appropriate activity status/level should be described as: []     0    Normal activity, no symptoms [x]     1    Restricted in physical strenuous activity but ambulatory, able to do out light work []     2    Ambulatory and capable of self care, unable to do work activities, up and about >50 % of waking hours                              []     3    Only limited self care, in bed greater than 50% of waking hours []     4    Completely disabled, no self care, confined to bed or chair []     5    Moribund  Past Medical History:  Diagnosis Date  . Allergy   . Anxiety   . Arthritis   . Depression   . Neuromuscular disorder ([place])   Severe protein calorie malnutrition  Past Surgical History:  Procedure Laterality Date  . TUBAL LIGATION      Family History  Problem Relation Age of Onset  . Diabetes Mother     Social History Social History   Tobacco Use  . Smoking status: Current Every Day Smoker    Packs/day: 1.00    Years: 50.00    Pack years: 50.00    Types: Cigarettes  .  Smokeless tobacco: Never Used  Substance Use Topics  . Alcohol use: No  . Drug use: No    Current Outpatient Medications  Medication Sig Dispense Refill  . ASPIRIN PO Take by mouth.    . meloxicam (MOBIC) 7.5 MG tablet Take 1 tablet (7.5 mg total) by mouth daily. 15 tablet 0   No current facility-administered medications for this visit.     Allergies  Allergen Reactions  . Penicillins Hives    Review of Systems  Constitutional: Positive for activity change, appetite change, fatigue and unexpected weight change.  HENT: Positive for dental problem (dentures). Negative for trouble swallowing and voice change.   Eyes: Negative for visual disturbance.  Respiratory: Positive for cough and shortness of breath. Negative for wheezing.   Cardiovascular: Negative for chest pain.  Genitourinary: Negative for difficulty urinating and dysuria.  Musculoskeletal: Negative for arthralgias and myalgias.  Neurological: Positive for headaches. Negative for seizures.  Hematological: Negative for adenopathy. Does not bruise/bleed easily.  Psychiatric/Behavioral: The patient is nervous/anxious.  BP 117/72   Pulse (!) 108   Temp 99.1 F (37.3 C)   Resp 18   Wt 87 lb 11.2 oz (39.8 kg)   SpO2 93%   BMI 16.57 kg/m  Physical Exam  Constitutional: She is oriented to person, place, and time. No distress.  Cachectic 72 yo woman in NAD  HENT:  Head: Normocephalic and atraumatic.  Temporal wasting  Eyes: Conjunctivae and EOM are normal. No scleral icterus.  Neck: No thyromegaly present.  Cardiovascular: Regular rhythm and normal heart sounds. Exam reveals no gallop and no friction rub.  No murmur heard. tachycardic  Pulmonary/Chest: Effort normal. No respiratory distress. She has no wheezes. She has no rales.  Diminished BS bilaterally  Abdominal: Soft. She exhibits no distension. There is no tenderness.  Musculoskeletal: She exhibits deformity (thenar wasting). She exhibits no edema.   Lymphadenopathy:    She has no cervical adenopathy.  Neurological: She is alert and oriented to person, place, and time. No cranial nerve deficit. She exhibits normal muscle tone. Coordination normal.  Skin: Skin is warm and dry.  Vitals reviewed.    Diagnostic Tests: NUCLEAR MEDICINE PET SKULL BASE TO THIGH  TECHNIQUE: 5.8 mCi F-18 FDG was injected intravenously. Full-ring PET imaging was performed from the skull base to thigh after the radiotracer. CT data was obtained and used for attenuation correction and anatomic localization.  Fasting blood glucose: 100 mg/dl  COMPARISON:  CT 07/13/2017  FINDINGS: Mediastinal blood pool activity: SUV max 1.9  NECK: No hypermetabolic lymph nodes in the neck.  Incidental CT findings: none  CHEST: Hypermetabolic mass in the LEFT lower lobe extends from the hilum inferomedially along the pleural surface measuring 5.1 x 7.0 cm. Mass is intensely hypermetabolic with SUV max equal 21.2  No clear hypermetabolic mediastinal lymph nodes.  Rounded nodule in the RIGHT lower lobe measuring 6 mm and does not have associated metabolic activity.  Incidental CT findings: Extensive upper lobe emphysema  ABDOMEN/PELVIS: No abnormal hypermetabolic activity within the liver, pancreas, adrenal glands, or spleen. No hypermetabolic lymph nodes in the abdomen or pelvis.  Incidental CT findings: Atherosclerotic calcification of the aorta. Moderate large volume stool in the rectum.  SKELETON: No focal hypermetabolic activity to suggest skeletal metastasis.  Incidental CT findings: none  IMPRESSION: 1. Hypermetabolic LEFT lower lobe mass abutting the pleural surface is consistent with bronchogenic carcinoma. 2. No evidence of hypermetabolic mediastinal adenopathy. 3. Rounded nodule in the RIGHT lower lobe does not have associated metabolic activity therefore is favored benign. Recommend attention on follow-up. 4. No evidence  distant metastatic disease.   Electronically Signed   By: Suzy Bouchard M.D.   On: 08/03/2017 16:39 I personally reviewed the PET CT images and concur with the findings noted above.  Impression: 72 yo woman with a 50 py histroy of smoking and severe emphysema who presents with cough and weight loss. Weight loss has been going on for almost a year and she shows signs of severe protein calorie malnutrition. Workup revealed a 7 cm mass in the left lower lobe. This is almost certainly a new primary bronchogenic carcinoma, but we need a biopsy to definitively establish the diagnosis and guide therapy. This should be accessible with bronchoscopy. Will plan to do under general anesthesia to give [place] time and airway control to get good samples.  There is nothing to suggest this is TB  She is not a candidate for surgical resection.  I discussed the proposed procedure of bronchoscopy for biopsy with Ms.  Sherry [name]. She understands we would do the procedure in the OR under GA. I informed her of the indications, risks, benefits and alternatives. She understands the risks include those associated with GA. She understands the risks include but are not limited to death, MI, DVT, PE, bleeding, pneumothorax and nondiagnostic biopsies, as well as the possibility of other unforeseeable complications.  Offered Monday 8/19 for biopsy but she declined and wanted to wait until 8/23  Plan: Bronchoscopy for biopsy on Friday 8/23  Melrose Nakayama, MD [facility] [phone]

## 2017-08-23 NOTE — Progress Notes (Signed)
Radiation Oncology         [phone] ________________________________ Multidisciplinary Thoracic Oncology Clinic [facility]) Initial Outpatient Consultation  Name: Sherry [name] MRN: [id number]  Date of Service: 08/23/2017 DOB: Oct 01, 1945  ME:QASTM, Sherrill Raring, NP  [name], NP   REFERRING PHYSICIAN: [name], NP  DIAGNOSIS: 72 yo woman with a presumed left lower lung non-small cell carcinoma    ICD-10-CM   1. Mediastinal mass J98.59     HISTORY OF PRESENT ILLNESS: Sherry [name] is a 72 y.o. female seen at the request of Sherry Abrahams, FNP . She initially presented to her PCP with c/o cough and weight los in 06/2017.  A CXR was performed on 06/08/17 and revealed an ill-defined masslike opacity extending posteriorly from the inferior left hilum into the superior segment of the left upper lobe measuring approximately 5 cm in size.  Additionally, there was a small nodule noted in the right middle lobe.  This was further evaluated with a CT chest on 07/13/2017 confirming a 7 cm left retro hilar mass in the lower lobe, suspicious for malignancy. There was also a 0.7 x 0.7 cm pulmonary nodule in the superior segment right lower lobe concerning for metastatic lesion.   A PET scan was performed on 08/03/17 which revealed a hypermetabolic mass in the left lower lobe extending from the hilum inferior medially along the pleural surface and measuring 7 cm.  There were no clear hypermetabolic mediastinal lymph nodes.  The rounded nodule in the right lower lobe measuring 6 mm was not particularly hypermetabolic and therefore favored benign.      She has not had tissue biopsy.  The patient was referred today for presentation in the multidisciplinary thoracic oncology conference. Radiology studies and pathology slides were presented there for review and discussion of treatment options. A consensus was discussed regarding potential next steps.  PREVIOUS RADIATION THERAPY: No  PAST MEDICAL HISTORY:    Past Medical History:  Diagnosis Date  . Allergy   . Anxiety   . Arthritis   . Depression   . Neuromuscular disorder ([place])       PAST SURGICAL HISTORY: Past Surgical History:  Procedure Laterality Date  . TUBAL LIGATION      FAMILY HISTORY:  Family History  Problem Relation Age of Onset  . Diabetes Mother     SOCIAL HISTORY:  Social History   Socioeconomic History  . Marital status: Divorced    Spouse name: Not on file  . Number of children: Not on file  . Years of education: Not on file  . Highest education level: Not on file  Occupational History  . Not on file  Social Needs  . Financial resource strain: Not on file  . Food insecurity:    Worry: Not on file    Inability: Not on file  . Transportation needs:    Medical: Not on file    Non-medical: Not on file  Tobacco Use  . Smoking status: Current Every Day Smoker    Packs/day: 1.00    Years: 50.00    Pack years: 50.00    Types: Cigarettes  . Smokeless tobacco: Never Used  Substance and Sexual Activity  . Alcohol use: No  . Drug use: No  . Sexual activity: Never  Lifestyle  . Physical activity:    Days per week: Not on file    Minutes per session: Not on file  . Stress: Not on file  Relationships  . Social connections:  Talks on phone: Not on file    Gets together: Not on file    Attends religious service: Not on file    Active member of club or organization: Not on file    Attends meetings of clubs or organizations: Not on file    Relationship status: Not on file  . Intimate partner violence:    Fear of current or ex partner: Not on file    Emotionally abused: Not on file    Physically abused: Not on file    Forced sexual activity: Not on file  Other Topics Concern  . Not on file  Social History Narrative  . Not on file    ALLERGIES: Penicillins  MEDICATIONS:  Current Outpatient Medications  Medication Sig Dispense Refill  . Cyanocobalamin (VITAMIN DEFICIENCY SYSTEM-B12) 1000  MCG/ML KIT Inject 1,000 mcg as directed every 30 (thirty) days.    . meloxicam (MOBIC) 7.5 MG tablet Take 1 tablet (7.5 mg total) by mouth daily. (Patient not taking: Reported on 08/24/2017) 15 tablet 0  . Multiple Vitamin (MULTIVITAMIN WITH MINERALS) TABS tablet Take 1 tablet by mouth daily.    . naproxen sodium (ALEVE) 220 MG tablet Take 220 mg by mouth daily as needed (headache).     No current facility-administered medications for this encounter.     REVIEW OF SYSTEMS:  On review of systems, the patient reports that she is doing well overall. She denies any chest pain, shortness of breath, cough, fevers, chills, night sweats, unintended weight changes. She denies any bowel or bladder disturbances, and denies abdominal pain, nausea or vomiting. She denies any new musculoskeletal or joint aches or pains. A complete review of systems is obtained and is otherwise negative.   PHYSICAL EXAM:  Wt Readings from Last 3 Encounters:  08/23/17 87 lb 11.2 oz (39.8 kg)  08/23/17 87 lb 11.2 oz (39.8 kg)  12/29/15 82 lb 9.6 oz (37.5 kg)   Temp Readings from Last 3 Encounters:  08/23/17 99.1 F (37.3 C)  08/23/17 99.1 F (37.3 C)  12/29/15 98.1 F (36.7 C) (Oral)   BP Readings from Last 3 Encounters:  08/23/17 117/72  08/23/17 117/72  12/29/15 124/72   Pulse Readings from Last 3 Encounters:  08/23/17 (!) 108  08/23/17 (!) 108  12/29/15 85    /10  In general this is a well appearing caucasian woman in no acute distress. She is alert and oriented x4 and appropriate throughout the examination. HEENT reveals that the patient is normocephalic, atraumatic. EOMs are intact. PERRLA. Skin is intact without any evidence of gross lesions. Cardiovascular exam reveals a regular rate and rhythm, no clicks rubs or murmurs are auscultated. Chest is clear to auscultation bilaterally. Lymphatic assessment is performed and does not reveal any adenopathy in the cervical, supraclavicular, axillary, or inguinal  chains. Abdomen has active bowel sounds in all quadrants and is intact. The abdomen is soft, non tender, non distended. Lower extremities are negative for pretibial pitting edema, deep calf tenderness, cyanosis or clubbing. Patient does report having on and off numbness in her feet.  KPS = 90  100 - Normal; no complaints; no evidence of disease. 90   - Able to carry on normal activity; minor signs or symptoms of disease. 80   - Normal activity with effort; some signs or symptoms of disease. 39   - Cares for self; unable to carry on normal activity or to do active work. 60   - Requires occasional assistance, but is able to care  for most of his personal needs. 50   - Requires considerable assistance and frequent medical care. 87   - Disabled; requires special care and assistance. 35   - Severely disabled; hospital admission is indicated although death not imminent. 85   - Very sick; hospital admission necessary; active supportive treatment necessary. 10   - Moribund; fatal processes progressing rapidly. 0     - Dead  Karnofsky DA, Abelmann [place], Craver LS and Burchenal [facility] [id number]) The use of the nitrogen mustards in the palliative treatment of carcinoma: with particular reference to bronchogenic carcinoma Cancer 1 634-56  LABORATORY DATA:  Lab Results  Component Value Date   WBC 11.7 (A) 04/23/2012   HGB 13.9 04/23/2012   HCT 41.0 04/23/2012   MCV 91.9 04/23/2012   Lab Results  Component Value Date   NA 139 04/29/2012   K 4.3 04/29/2012   CL 101 04/29/2012   CO2 31 04/29/2012   No results found for: ALT, AST, GGT, ALKPHOS, BILITOT   RADIOGRAPHY: Nm Pet Image Initial (pi) Skull Base To Thigh  Result Date: 08/03/2017 CLINICAL DATA:  Initial treatment strategy for lung mass. EXAM: NUCLEAR MEDICINE PET SKULL BASE TO THIGH TECHNIQUE: 5.8 mCi F-18 FDG was injected intravenously. Full-ring PET imaging was performed from the skull base to thigh after the radiotracer. CT data was obtained and  used for attenuation correction and anatomic localization. Fasting blood glucose: 100 mg/dl COMPARISON:  CT 07/13/2017 FINDINGS: Mediastinal blood pool activity: SUV max 1.9 NECK: No hypermetabolic lymph nodes in the neck. Incidental CT findings: none CHEST: Hypermetabolic mass in the LEFT lower lobe extends from the hilum inferomedially along the pleural surface measuring 5.1 x 7.0 cm. Mass is intensely hypermetabolic with SUV max equal 21.2 No clear hypermetabolic mediastinal lymph nodes. Rounded nodule in the RIGHT lower lobe measuring 6 mm and does not have associated metabolic activity. Incidental CT findings: Extensive upper lobe emphysema ABDOMEN/PELVIS: No abnormal hypermetabolic activity within the liver, pancreas, adrenal glands, or spleen. No hypermetabolic lymph nodes in the abdomen or pelvis. Incidental CT findings: Atherosclerotic calcification of the aorta. Moderate large volume stool in the rectum. SKELETON: No focal hypermetabolic activity to suggest skeletal metastasis. Incidental CT findings: none IMPRESSION: 1. Hypermetabolic LEFT lower lobe mass abutting the pleural surface is consistent with bronchogenic carcinoma. 2. No evidence of hypermetabolic mediastinal adenopathy. 3. Rounded nodule in the RIGHT lower lobe does not have associated metabolic activity therefore is favored benign. Recommend attention on follow-up. 4. No evidence distant metastatic disease. Electronically Signed   By: Suzy Bouchard M.D.   On: 08/03/2017 16:39      IMPRESSION/PLAN: 1. 72 y.o. woman with a presumed left lower lung non-small cell carcinoma Today, we talked to the patient and family about the findings and workup thus far. We discussed the natural history of lung carcinoma and general treatment, highlighting the role of radiotherapy in the management. We discussed the available radiation techniques, and focused on the details of logistics and delivery. We reviewed the anticipated acute and late sequelae  associated with radiation in this setting. The patient was encouraged to ask questions that were answered to her satisfaction.  She is planning to have a bronchoscopy for tissue confirmation wit Dr. Roxan Hockey on Friday, August 31, 2017. And pending those results, she will most likely undergo concurrent chemoradiation, unless otherwise indicated in final pathology.  We spent time face to face with the patient and more than 50% of that time was spent in counseling and/or coordination  of care.   Nicholos Johns, PA-C    Tyler Pita, MD  [facility] Direct Dial: [phone]  Fax: [phone] [email or web address]  Skype  [email or web address]  This document serves as a record of services personally performed by Tyler Pita, MD and Ashlyn Bruning PA-C. It was created on their behalf by Delton Coombes, a trained medical scribe. The creation of this record is based on the scribe's personal observations and the provider's statements to them.

## 2017-08-24 ENCOUNTER — Institutional Professional Consult (permissible substitution): Payer: PPO | Admitting: Pulmonary Disease

## 2017-08-24 ENCOUNTER — Other Ambulatory Visit: Payer: Self-pay | Admitting: *Deleted

## 2017-08-24 DIAGNOSIS — J984 Other disorders of lung: Secondary | ICD-10-CM

## 2017-08-26 DIAGNOSIS — C3432 Malignant neoplasm of lower lobe, left bronchus or lung: Secondary | ICD-10-CM | POA: Insufficient documentation

## 2017-08-27 DIAGNOSIS — K146 Glossodynia: Secondary | ICD-10-CM | POA: Diagnosis not present

## 2017-08-27 DIAGNOSIS — E538 Deficiency of other specified B group vitamins: Secondary | ICD-10-CM | POA: Diagnosis not present

## 2017-08-27 DIAGNOSIS — R634 Abnormal weight loss: Secondary | ICD-10-CM | POA: Diagnosis not present

## 2017-08-27 DIAGNOSIS — R918 Other nonspecific abnormal finding of lung field: Secondary | ICD-10-CM | POA: Diagnosis not present

## 2017-08-27 DIAGNOSIS — F411 Generalized anxiety disorder: Secondary | ICD-10-CM | POA: Diagnosis not present

## 2017-08-29 ENCOUNTER — Encounter (HOSPITAL_COMMUNITY): Payer: Self-pay | Admitting: *Deleted

## 2017-08-29 ENCOUNTER — Other Ambulatory Visit: Payer: Self-pay

## 2017-08-29 NOTE — Progress Notes (Signed)
Pt denies SOB, chest pain, and being under the care of a cardiologist. Pt denies having a stress test , echo and cardiac cath. Pt denies having an EKG within the last year. Pt denies recent labs. Pt made aware to stop taking vitamins, fish oil and herbal medications. Do not take any NSAIDs ie: Ibuprofen, Advil, Naproxen (Aleve), Motrin, BC and Goody Powder. Pt verbalized understanding of all pre-op instructions.

## 2017-08-31 ENCOUNTER — Encounter (HOSPITAL_COMMUNITY): Payer: Self-pay | Admitting: *Deleted

## 2017-08-31 ENCOUNTER — Observation Stay (HOSPITAL_COMMUNITY)
Admission: RE | Admit: 2017-08-31 | Discharge: 2017-09-01 | Disposition: A | Discharge status: Home / Self Care | Payer: PPO | Source: Ambulatory Visit | Attending: Thoracic Surgery (Cardiothoracic Vascular Surgery) | Admitting: Thoracic Surgery (Cardiothoracic Vascular Surgery)

## 2017-08-31 ENCOUNTER — Ambulatory Visit (HOSPITAL_COMMUNITY): Payer: PPO | Admitting: Certified Registered Nurse Anesthetist

## 2017-08-31 ENCOUNTER — Other Ambulatory Visit: Payer: Self-pay

## 2017-08-31 ENCOUNTER — Ambulatory Visit (HOSPITAL_COMMUNITY): Payer: PPO

## 2017-08-31 ENCOUNTER — Encounter (HOSPITAL_COMMUNITY)
Admission: RE | Disposition: A | Discharge status: Home / Self Care | Payer: Self-pay | Source: Ambulatory Visit | Attending: Thoracic Surgery (Cardiothoracic Vascular Surgery)

## 2017-08-31 DIAGNOSIS — F419 Anxiety disorder, unspecified: Secondary | ICD-10-CM | POA: Diagnosis not present

## 2017-08-31 DIAGNOSIS — Z79899 Other long term (current) drug therapy: Secondary | ICD-10-CM | POA: Insufficient documentation

## 2017-08-31 DIAGNOSIS — J90 Pleural effusion, not elsewhere classified: Secondary | ICD-10-CM | POA: Insufficient documentation

## 2017-08-31 DIAGNOSIS — Z880 Allergy status to penicillin: Secondary | ICD-10-CM | POA: Diagnosis not present

## 2017-08-31 DIAGNOSIS — R918 Other nonspecific abnormal finding of lung field: Secondary | ICD-10-CM

## 2017-08-31 DIAGNOSIS — F329 Major depressive disorder, single episode, unspecified: Secondary | ICD-10-CM | POA: Diagnosis not present

## 2017-08-31 DIAGNOSIS — C3432 Malignant neoplasm of lower lobe, left bronchus or lung: Principal | ICD-10-CM | POA: Insufficient documentation

## 2017-08-31 DIAGNOSIS — E876 Hypokalemia: Secondary | ICD-10-CM | POA: Diagnosis not present

## 2017-08-31 DIAGNOSIS — J449 Chronic obstructive pulmonary disease, unspecified: Secondary | ICD-10-CM | POA: Diagnosis not present

## 2017-08-31 DIAGNOSIS — J984 Other disorders of lung: Secondary | ICD-10-CM

## 2017-08-31 DIAGNOSIS — Z87891 Personal history of nicotine dependence: Secondary | ICD-10-CM | POA: Insufficient documentation

## 2017-08-31 DIAGNOSIS — J939 Pneumothorax, unspecified: Secondary | ICD-10-CM | POA: Diagnosis not present

## 2017-08-31 DIAGNOSIS — J9383 Other pneumothorax: Secondary | ICD-10-CM | POA: Diagnosis not present

## 2017-08-31 DIAGNOSIS — C3490 Malignant neoplasm of unspecified part of unspecified bronchus or lung: Secondary | ICD-10-CM | POA: Diagnosis not present

## 2017-08-31 DIAGNOSIS — Z7982 Long term (current) use of aspirin: Secondary | ICD-10-CM | POA: Diagnosis not present

## 2017-08-31 HISTORY — DX: Headache: R51

## 2017-08-31 HISTORY — DX: Family history of other specified conditions: Z84.89

## 2017-08-31 HISTORY — DX: Other complications of anesthesia, initial encounter: T88.59XA

## 2017-08-31 HISTORY — DX: Pneumonia, unspecified organism: J18.9

## 2017-08-31 HISTORY — DX: Adverse effect of unspecified anesthetic, initial encounter: T41.45XA

## 2017-08-31 HISTORY — DX: Chronic obstructive pulmonary disease, unspecified: J44.9

## 2017-08-31 HISTORY — DX: Presence of spectacles and contact lenses: Z97.3

## 2017-08-31 HISTORY — DX: Complete loss of teeth, unspecified cause, unspecified class: K08.109

## 2017-08-31 HISTORY — DX: Other disorders of lung: J98.4

## 2017-08-31 HISTORY — PX: VIDEO BRONCHOSCOPY: SHX5072

## 2017-08-31 HISTORY — DX: Presence of dental prosthetic device (complete) (partial): Z97.2

## 2017-08-31 HISTORY — DX: Headache, unspecified: R51.9

## 2017-08-31 LAB — COMPREHENSIVE METABOLIC PANEL
ALBUMIN: 2.6 g/dL — AB (ref 3.5–5.0)
ALT: 8 U/L (ref 0–44)
AST: 14 U/L — AB (ref 15–41)
Alkaline Phosphatase: 76 U/L (ref 38–126)
Anion gap: 9 (ref 5–15)
BILIRUBIN TOTAL: 0.7 mg/dL (ref 0.3–1.2)
BUN: 7 mg/dL — AB (ref 8–23)
CHLORIDE: 100 mmol/L (ref 98–111)
CO2: 32 mmol/L (ref 22–32)
CREATININE: 0.58 mg/dL (ref 0.44–1.00)
Calcium: 9.8 mg/dL (ref 8.9–10.3)
GFR calc Af Amer: >60 mL/min (ref >60)
Glucose, Bld: 91 mg/dL (ref 70–99)
Potassium: 3.1 mmol/L — ABNORMAL LOW (ref 3.5–5.1)
Sodium: 141 mmol/L (ref 135–145)
TOTAL PROTEIN: 6.5 g/dL (ref 6.5–8.1)

## 2017-08-31 LAB — CBC
HCT: 36.6 % (ref 36.0–46.0)
Hemoglobin: 10.8 g/dL — ABNORMAL LOW (ref 12.0–15.0)
MCH: 25.7 pg — ABNORMAL LOW (ref 26.0–34.0)
MCHC: 29.5 g/dL — ABNORMAL LOW (ref 30.0–36.0)
MCV: 87.1 fL (ref 78.0–100.0)
Platelets: 439 10*3/uL — ABNORMAL HIGH (ref 150–400)
RBC: 4.2 MIL/uL (ref 3.87–5.11)
RDW: 15.5 % (ref 11.5–15.5)
WBC: 7.9 10*3/uL (ref 4.0–10.5)

## 2017-08-31 LAB — APTT: aPTT: 32 seconds (ref 24–36)

## 2017-08-31 LAB — PROTIME-INR
INR: 1.04
PROTHROMBIN TIME: 13.5 s (ref 11.4–15.2)

## 2017-08-31 SURGERY — BRONCHOSCOPY, VIDEO-ASSISTED
Anesthesia: General

## 2017-08-31 MED ORDER — EPINEPHRINE PF 1 MG/ML IJ SOLN
INTRAMUSCULAR | Status: AC
  Filled 2017-08-31: qty 1

## 2017-08-31 MED ORDER — FENTANYL CITRATE (PF) 100 MCG/2ML IJ SOLN
INTRAMUSCULAR | Status: DC | PRN
  Administered 2017-08-31: 50 ug via INTRAVENOUS

## 2017-08-31 MED ORDER — EPINEPHRINE PF 1 MG/ML IJ SOLN
INTRAMUSCULAR | Status: DC | PRN
  Administered 2017-08-31: 1 mg

## 2017-08-31 MED ORDER — MIDAZOLAM HCL 2 MG/2ML IJ SOLN
INTRAMUSCULAR | Status: AC
  Filled 2017-08-31: qty 2

## 2017-08-31 MED ORDER — FENTANYL CITRATE (PF) 100 MCG/2ML IJ SOLN: 25.0000 ug | INTRAMUSCULAR | Status: DC | PRN

## 2017-08-31 MED ORDER — SODIUM CHLORIDE 0.9 % IV SOLN
INTRAVENOUS | Status: DC | PRN
  Administered 2017-08-31: 20 ug/min via INTRAVENOUS

## 2017-08-31 MED ORDER — ONDANSETRON HCL 4 MG/2ML IJ SOLN: 4.0000 mg | Freq: Once | INTRAMUSCULAR | Status: DC | PRN

## 2017-08-31 MED ORDER — ONDANSETRON HCL 4 MG/2ML IJ SOLN
INTRAMUSCULAR | Status: DC | PRN
  Administered 2017-08-31: 4 mg via INTRAVENOUS

## 2017-08-31 MED ORDER — PHENYLEPHRINE 40 MCG/ML (10ML) SYRINGE FOR IV PUSH (FOR BLOOD PRESSURE SUPPORT)
PREFILLED_SYRINGE | INTRAVENOUS | Status: DC | PRN
  Administered 2017-08-31 (×3): 40 ug via INTRAVENOUS

## 2017-08-31 MED ORDER — PROPOFOL 10 MG/ML IV BOLUS
INTRAVENOUS | Status: DC | PRN
  Administered 2017-08-31: 60 mg via INTRAVENOUS

## 2017-08-31 MED ORDER — 0.9 % SODIUM CHLORIDE (POUR BTL) OPTIME
TOPICAL | Status: DC | PRN
  Administered 2017-08-31: 1000 mL

## 2017-08-31 MED ORDER — PROPOFOL 10 MG/ML IV BOLUS
INTRAVENOUS | Status: AC
  Filled 2017-08-31: qty 20

## 2017-08-31 MED ORDER — OXYCODONE HCL 5 MG PO TABS: 5.0000 mg | ORAL_TABLET | Freq: Once | ORAL | Status: DC | PRN

## 2017-08-31 MED ORDER — ENSURE ENLIVE PO LIQD
237.0000 mL | Freq: Two times a day (BID) | ORAL | Status: DC
  Administered 2017-09-01: 237 mL via ORAL

## 2017-08-31 MED ORDER — SUGAMMADEX SODIUM 200 MG/2ML IV SOLN
INTRAVENOUS | Status: DC | PRN
  Administered 2017-08-31: 60 mg via INTRAVENOUS

## 2017-08-31 MED ORDER — LACTATED RINGERS IV SOLN
INTRAVENOUS | Status: DC
  Administered 2017-08-31: 12:00:00 via INTRAVENOUS

## 2017-08-31 MED ORDER — ROCURONIUM BROMIDE 50 MG/5ML IV SOSY
PREFILLED_SYRINGE | INTRAVENOUS | Status: DC | PRN
  Administered 2017-08-31: 30 mg via INTRAVENOUS

## 2017-08-31 MED ORDER — FENTANYL CITRATE (PF) 250 MCG/5ML IJ SOLN
INTRAMUSCULAR | Status: AC
  Filled 2017-08-31: qty 5

## 2017-08-31 MED ORDER — DEXAMETHASONE SODIUM PHOSPHATE 10 MG/ML IJ SOLN
INTRAMUSCULAR | Status: DC | PRN
  Administered 2017-08-31: 5 mg via INTRAVENOUS

## 2017-08-31 MED ORDER — OXYCODONE HCL 5 MG/5ML PO SOLN: 5.0000 mg | Freq: Once | ORAL | Status: DC | PRN

## 2017-08-31 MED ORDER — NAPROXEN SODIUM 220 MG PO TABS: 220.0000 mg | ORAL_TABLET | Freq: Every day | ORAL | Status: DC | PRN

## 2017-08-31 MED ORDER — LACTATED RINGERS IV SOLN
INTRAVENOUS | Status: DC
  Administered 2017-08-31 – 2017-09-01 (×2): via INTRAVENOUS

## 2017-08-31 MED ORDER — LIDOCAINE 2% (20 MG/ML) 5 ML SYRINGE
INTRAMUSCULAR | Status: DC | PRN
  Administered 2017-08-31: 40 mg via INTRAVENOUS

## 2017-08-31 MED ORDER — ACETAMINOPHEN 325 MG PO TABS: 650.0000 mg | ORAL_TABLET | Freq: Four times a day (QID) | ORAL | Status: DC | PRN

## 2017-08-31 MED ORDER — NAPROXEN 250 MG PO TABS
250.0000 mg | ORAL_TABLET | Freq: Every day | ORAL | Status: DC | PRN
  Filled 2017-08-31: qty 1

## 2017-08-31 MED ORDER — MIDAZOLAM HCL 5 MG/5ML IJ SOLN
INTRAMUSCULAR | Status: DC | PRN
  Administered 2017-08-31: 1 mg via INTRAVENOUS

## 2017-08-31 SURGICAL SUPPLY — 32 items
BRUSH CYTOL CELLEBRITY 1.5X140 (MISCELLANEOUS) IMPLANT
CANISTER SUCT 3000ML PPV (MISCELLANEOUS) ×3 IMPLANT
CONT SPEC 4OZ CLIKSEAL STRL BL (MISCELLANEOUS) ×4 IMPLANT
COVER BACK TABLE 60X90IN (DRAPES) ×3 IMPLANT
FILTER STRAW FLUID ASPIR (MISCELLANEOUS) ×2 IMPLANT
FORCEPS BIOP RJ4 1.8 (CUTTING FORCEPS) ×2 IMPLANT
FORCEPS RADIAL JAW LRG 4 PULM (INSTRUMENTS) IMPLANT
GAUZE SPONGE 4X4 12PLY STRL (GAUZE/BANDAGES/DRESSINGS) ×3 IMPLANT
GLOVE SURG SIGNA 7.5 PF LTX (GLOVE) ×3 IMPLANT
GOWN STRL REUS W/ TWL LRG LVL3 (GOWN DISPOSABLE) ×1 IMPLANT
GOWN STRL REUS W/ TWL XL LVL3 (GOWN DISPOSABLE) ×1 IMPLANT
GOWN STRL REUS W/TWL LRG LVL3 (GOWN DISPOSABLE) ×3
GOWN STRL REUS W/TWL XL LVL3 (GOWN DISPOSABLE) ×6
KIT CLEAN ENDO COMPLIANCE (KITS) ×3 IMPLANT
KIT TURNOVER KIT B (KITS) ×3 IMPLANT
MARKER SKIN DUAL TIP RULER LAB (MISCELLANEOUS) ×3 IMPLANT
NS IRRIG 1000ML POUR BTL (IV SOLUTION) ×3 IMPLANT
OIL SILICONE PENTAX (PARTS (SERVICE/REPAIRS)) ×3 IMPLANT
PAD ARMBOARD 7.5X6 YLW CONV (MISCELLANEOUS) ×6 IMPLANT
RADIAL JAW LRG 4 PULMONARY (INSTRUMENTS)
SYR 20ML ECCENTRIC (SYRINGE) ×6 IMPLANT
SYR 5ML LL (SYRINGE) ×1 IMPLANT
SYR 5ML LUER SLIP (SYRINGE) ×3 IMPLANT
TOWEL GREEN STERILE (TOWEL DISPOSABLE) ×1 IMPLANT
TOWEL GREEN STERILE FF (TOWEL DISPOSABLE) ×3 IMPLANT
TRAP SPECIMEN MUCOUS 40CC (MISCELLANEOUS) ×3 IMPLANT
TUBE CONNECTING 20'X1/4 (TUBING) ×1
TUBE CONNECTING 20X1/4 (TUBING) ×2 IMPLANT
VALVE BIOPSY  SINGLE USE (MISCELLANEOUS) ×4
VALVE BIOPSY SINGLE USE (MISCELLANEOUS) IMPLANT
VALVE DISPOSABLE (MISCELLANEOUS) ×3 IMPLANT
VALVE SUCTION BRONCHIO DISP (MISCELLANEOUS) ×2 IMPLANT

## 2017-08-31 NOTE — Anesthesia Preprocedure Evaluation (Addendum)
Anesthesia Evaluation  Patient identified by MRN, date of birth, ID band Patient awake    Reviewed: Allergy & Precautions, NPO status , Patient's Chart, lab work & pertinent test results  History of Anesthesia Complications (+) AWARENESS UNDER ANESTHESIA and history of anesthetic complications  Airway Mallampati: II  TM Distance: >3 FB Neck ROM: Full  Mouth opening: Limited Mouth Opening  Dental  (+) Edentulous Upper, Edentulous Lower   Pulmonary COPD, former smoker,   LLL mass  '19 CXR - 1. New right apical 10% pneumothorax. 2. Enlarging lung cancer in the left lower lobe. New pleural thickening or loculated effusion at the left lung base laterally. 3.  Emphysema  '19 CT Chest - 1. 7 cm left retro hilar mass in the lower lobe of high suspicion for malignancy. Adjacent left hilar adenopathy. The mass abuts the medial and posterior pleural surfaces and there is some mild posterior pleural thickening in its vicinity. Also scattered adjacent nodularity in the left lower lobe probably from postobstructive pneumonitis. 2. 0.7 by 0.7 cm sharply defined solid pulmonary nodule in the right upper lobe concerning for possible contralateral metastatic lesion.  3. Other imaging findings of potential clinical significance: Aortic Atherosclerosis and Emphysema. Biapical pleuroparenchymal scarring.   breath sounds clear to auscultation       Cardiovascular (-) anginanegative cardio ROS   Rhythm:Regular Rate:Normal     Neuro/Psych  Headaches, PSYCHIATRIC DISORDERS Anxiety Depression  Neuromuscular disease    GI/Hepatic negative GI ROS, Neg liver ROS,   Endo/Other  negative endocrine ROS  Renal/GU negative Renal ROS  negative genitourinary   Musculoskeletal  (+) Arthritis ,   Abdominal   Peds  Hematology negative hematology ROS (+)   Anesthesia Other Findings Hypokalemia   Reproductive/Obstetrics                             Anesthesia Physical Anesthesia Plan  ASA: III  Anesthesia Plan: General   Post-op Pain Management:    Induction: Intravenous  PONV Risk Score and Plan: 3 and Treatment may vary due to age or medical condition, Ondansetron and Propofol infusion  Airway Management Planned: Oral ETT  Additional Equipment: None  Intra-op Plan:   Post-operative Plan: Possible Post-op intubation/ventilation  Informed Consent: I have reviewed the patients History and Physical, chart, labs and discussed the procedure including the risks, benefits and alternatives for the proposed anesthesia with the patient or authorized representative who has indicated his/her understanding and acceptance.   Dental advisory given  Plan Discussed with: CRNA and Anesthesiologist  Anesthesia Plan Comments:        Anesthesia Quick Evaluation

## 2017-08-31 NOTE — Discharge Instructions (Addendum)
You may cough up small amounts of blood over the next few days  You may use an over the counter cough medication if needed  Follow up as scheduled with Drs. Mohamed and [place]  My office will contact you with a follow up appointment  Call [phone] if you develop chest pain, shortness of breath, fever > 101 F or cough up more than 2 tablespoons of blood    Pneumothorax A pneumothorax, commonly called a collapsed lung, is a condition in which air leaks from a lung and builds up in the space between the lung and the chest wall (pleural space). The air in a pneumothorax is trapped outside the lung and takes up space, preventing the lung from fully expanding. This is a condition that usually occurs suddenly. The buildup of air may be small or large. A small pneumothorax may go away on its own. When a pneumothorax is larger, it will often require medical treatment and hospitalization. What are the causes? A pneumothorax can sometimes happen quickly with no apparent cause. People with underlying lung problems, particularly COPD or emphysema, are at higher risk of pneumothorax. However, pneumothorax can happen quickly even in people with no prior known lung problems. Trauma, surgery, medical procedures, or injury to the chest wall can also cause a pneumothorax. What are the signs or symptoms? Sometimes a pneumothorax will have no symptoms. When symptoms are present, they can include:  Chest pain.  Shortness of breath.  Increased rate of breathing.  Bluish color to your lips or skin (cyanosis).  How is this diagnosed? Pneumothorax is usually diagnosed by a chest X-ray or chest CT scan. Your health care provider will also take a medical history and perform a physical exam to determine why you may have a pneumothorax. How is this treated? A small pneumothorax may go away on its own without treatment. Extra oxygen can sometimes help a small pneumothorax go away more quickly. For a larger  pneumothorax or a pneumothorax that is causing symptoms, a procedure is usually needed to drain the air.In some cases, the health care provider may drain the air using a needle. In other cases, a chest tube may be inserted into the pleural space. A chest tube is a small tube placed between the ribs and into the pleural space. This removes the extra air and allows the lung to expand back to its normal size. A large pneumothorax will usually require a hospital stay. If there is ongoing air leakage into the pleural space, then the chest tube may need to remain in place for several days until the air leak has healed. In some cases, surgery may be needed. Follow these instructions at home:  Only take over-the-counter or prescription medicines as directed by your health care provider.  If a cough or pain makes it difficult for you to sleep at night, try sleeping in a semi-upright position in a recliner or by using 2 or 3 pillows.  Rest and limit activity as directed by your health care provider.  If you had a chest tube and it was removed, ask your health care provider when it is okay to remove the dressing. Until your health care provider says you can remove the dressing, do not allow it to get wet.  Do not smoke. Smoking is a risk factor for pneumothorax.  Do not fly in an airplane or scuba dive until your health care provider says it is okay.  Follow up with your health care provider as directed.  Get help right away if:  You have increasing chest pain or shortness of breath.  You have a cough that is not controlled with suppressants.  You begin coughing up blood.  You have pain that is getting worse or is not controlled with medicines.  You cough up thick, discolored mucus (sputum) that is yellow to green in color.  You have redness, increasing pain, or discharge at the site where a chest tube had been in place (if your pneumothorax was treated with a chest tube).  The site where your  chest tube was located opens up.  You feel air coming out of the site where the chest tube was placed.  You have a fever or persistent symptoms for more than 2-3 days.  You have a fever and your symptoms suddenly get worse. This information is not intended to replace advice given to you by your health care provider. Make sure you discuss any questions you have with your health care provider. Document Released: 12/26/2004 Document Revised: 06/03/2015 Document Reviewed: 05/21/2013 Elsevier Interactive Patient Education  [employer].

## 2017-08-31 NOTE — Anesthesia Postprocedure Evaluation (Signed)
Anesthesia Post Note  Patient: Sherry [name]  Procedure(s) Performed: VIDEO BRONCHOSCOPY (N/A )     Patient location during evaluation: PACU Anesthesia Type: General Level of consciousness: awake and alert Pain management: pain level controlled Vital Signs Assessment: post-procedure vital signs reviewed and stable Respiratory status: spontaneous breathing, nonlabored ventilation, respiratory function stable and patient connected to nasal cannula oxygen Cardiovascular status: blood pressure returned to baseline and stable Postop Assessment: no apparent nausea or vomiting Anesthetic complications: no    Last Vitals:  Vitals:   08/31/17 1735 08/31/17 1803  BP: 100/60 (!) 97/57  Pulse: 91 87  Resp: (!) 23 18  Temp: (!) 36.4 C   SpO2: 100% 95%    Last Pain:  Vitals:   08/31/17 1803  TempSrc: Oral  PainSc:                  [name] COKER

## 2017-08-31 NOTE — Brief Op Note (Signed)
08/31/2017  4:43 PM  PATIENT:  Sherry [name]  72 y.o. female  PRE-OPERATIVE DIAGNOSIS:  LLL MASS  POST-OPERATIVE DIAGNOSIS:  LLL MASS- malignant cells present on brushings  PROCEDURE:  Procedure(s): VIDEO BRONCHOSCOPY (N/A) with brushings and transbronchial biopsies  SURGEON:  Surgeon(s) and Role:    * Melrose Nakayama, MD - Primary  PHYSICIAN ASSISTANT:   ASSISTANTS: none   ANESTHESIA:   general  EBL: minimal  BLOOD ADMINISTERED:none  DRAINS: none   LOCAL MEDICATIONS USED:  NONE  SPECIMEN:  Source of Specimen:  LLL mass  DISPOSITION OF SPECIMEN:  PATHOLOGY  COUNTS:  NO Endoscopic  TOURNIQUET:  * No tourniquets in log *  DICTATION: .Other Dictation: Dictation Number -  PLAN OF CARE: Admit for overnight observation  PATIENT DISPOSITION:  PACU - hemodynamically stable.   Delay start of Pharmacological VTE agent (>24hrs) due to surgical blood loss or risk of bleeding: not applicable

## 2017-08-31 NOTE — Op Note (Signed)
NAMEPIEPER, Sherry MEDICAL RECORD [id number] ACCOUNT [id number] DATE OF BIRTH:Jul 05, 1945 FACILITY: [facility] LOCATION: [facility] PHYSICIAN:Sumaiya Arruda Chaya Jan, MD  OPERATIVE REPORT  DATE OF PROCEDURE:  08/31/2017  PREOPERATIVE DIAGNOSES:  1. Left lower lobe mass 2. Right pneumothorax.  POSTOPERATIVE DIAGNOSES:  1. Malignancy left lower lobe. 2. Right pneumothorax.  PROCEDURE:  Bronchoscopy with brushings and transbronchial biopsies.  SURGEON:  Modesto Charon, MD  ASSISTANT:  None.  ANESTHESIA:  General.  FINDINGS:  Malignant cells noted on brushings.  Bleeding during biopsies, stopped with dilute epinephrine.  CLINICAL NOTE:  The patient is a 72 year old woman with a history of tobacco abuse who recently was found to have a large left lower lobe mass.  She was advised to undergo bronchoscopy for diagnostic purposes.  The indications, risks, benefits, and  alternatives were discussed in detail with the patient.  She understood and accepted the risks and agreed to proceed.  OPERATIVE NOTE:  The patient was noted on her preoperative chest x-ray to have a 10% right pneumothorax.  She was not aware of any symptoms that would correlate with development of a pneumothorax.  She was advised  that chest tube placement might be  necessary.  The patient was brought to the operating room on 08/31/2017.  She had induction of general anesthesia and was intubated.  There were no difficulties with ventilation and no elevated peak pressures.  After performing a timeout, flexible fiberoptic  bronchoscopy was performed via the endotracheal tube.  The trachea and right bronchial tree were within normal limits.  The left mainstem and left upper lobe bronchus were within normal limits.  At the origin of the left lower lobe bronchus, there was some edema in the  mucosa.  There was extrinsic compression of the superior segmental branches as well as the posterior basilar segmental airways.  No definite  endobronchial mass was seen.  Brushings were taken from both the basilar and the superior segmental airways and  sent for quick prep.  While awaiting those results multiple biopsies were obtained from the posterior basilar segmental bronchus.  There was some bleeding with the biopsies.  Dilute epinephrine was used to irrigate.  The brushings quick preps returned with  malignant cells seen.  Two additional biopsies were obtained.  On the last biopsy, there was dark red blood filling the airway after the biopsy was taken.  This filled the lower lobe basilar airway.  It did not extend into the left main stem.  Suctioning  was performed and dilute epinephrine was instilled into the area.  The bleeding ultimately cleared and there was no ongoing bleeding.  No additional biopsies were taken.    The patient was extubated in the operating room and taken to the [facility] in good condition.  AN/NUANCE  D:08/31/2017 T:08/31/2017 JOB:[id number]

## 2017-08-31 NOTE — Transfer of Care (Signed)
Immediate Anesthesia Transfer of Care Note  Patient: Sherry [name]  Procedure(s) Performed: VIDEO BRONCHOSCOPY (N/A )  Patient Location: PACU  Anesthesia Type:General  Level of Consciousness: awake, alert  and oriented  Airway & Oxygen Therapy: Patient Spontanous Breathing and Patient connected to nasal cannula oxygen  Post-op Assessment: Report given to RN and Post -op Vital signs reviewed and stable  Post vital signs: Reviewed and stable  Last Vitals:  Vitals Value Taken Time  BP 104/74 08/31/2017  4:36 PM  Temp    Pulse 109 08/31/2017  4:37 PM  Resp 39 08/31/2017  4:37 PM  SpO2 94 % 08/31/2017  4:37 PM  Vitals shown include unvalidated device data.  Last Pain:  Vitals:   08/31/17 1210  TempSrc:   PainSc: 0-No pain      Patients Stated Pain Goal: 2 (32/12/24 8250)  Complications: No apparent anesthesia complications

## 2017-08-31 NOTE — Transfer of Care (Deleted)
Immediate Anesthesia Transfer of Care Note  Patient: SAMYRA LIMB  Procedure(s) Performed: VIDEO BRONCHOSCOPY (N/A )  Patient Location: ICU  Anesthesia Type:General  Level of Consciousness: sedated and Patient remains intubated per anesthesia plan  Airway & Oxygen Therapy: Patient remains intubated per anesthesia plan and Patient placed on Ventilator (see vital sign flow sheet for setting)  Post-op Assessment: Report given to RN and Post -op Vital signs reviewed and stable  Post vital signs: Reviewed and stable  Last Vitals:  Vitals Value Taken Time  BP    Temp    Pulse    Resp    SpO2      Last Pain:  Vitals:   08/31/17 1210  TempSrc:   PainSc: 0-No pain      Patients Stated Pain Goal: 2 (63/14/97 0263)  Complications: No apparent anesthesia complications

## 2017-08-31 NOTE — Interval H&P Note (Signed)
History and Physical Interval Note:  CXR done earlier today shows a small RIGHT pneumothorax. She denies pain or shortness of breath. Will proceed with Bronchoscopy. If any issues in OR will place chest tube Will need to observe overnight   08/31/2017 2:55 PM  Sherry [name]  has presented today for surgery, with the diagnosis of LLL MASS  The various methods of treatment have been discussed with the patient and family. After consideration of risks, benefits and other options for treatment, the patient has consented to  Procedure(s): VIDEO BRONCHOSCOPY (N/A) as a surgical intervention .  The patient's history has been reviewed, patient examined, no change in status, stable for surgery.  I have reviewed the patient's chart and labs.  Questions were answered to the patient's satisfaction.     Melrose Nakayama

## 2017-08-31 NOTE — Anesthesia Procedure Notes (Signed)
Procedure Name: Intubation Date/Time: 08/31/2017 3:28 PM Performed by: Candis Shine, CRNA Pre-anesthesia Checklist: Patient identified, Emergency Drugs available, Suction available and Patient being monitored Patient Re-evaluated:Patient Re-evaluated prior to induction Oxygen Delivery Method: Circle System Utilized Preoxygenation: Pre-oxygenation with 100% oxygen Induction Type: IV induction Ventilation: Mask ventilation without difficulty Laryngoscope Size: Mac and 3 Grade View: Grade I Tube type: Oral Tube size: 8.0 mm Number of attempts: 1 Airway Equipment and Method: Stylet Placement Confirmation: ETT inserted through vocal cords under direct vision,  positive ETCO2 and breath sounds checked- equal and bilateral Secured at: 21 cm Tube secured with: Tape Dental Injury: Teeth and Oropharynx as per pre-operative assessment

## 2017-09-01 ENCOUNTER — Encounter (HOSPITAL_COMMUNITY): Payer: Self-pay | Admitting: Thoracic Surgery (Cardiothoracic Vascular Surgery)

## 2017-09-01 ENCOUNTER — Observation Stay (HOSPITAL_COMMUNITY): Payer: PPO

## 2017-09-01 DIAGNOSIS — C3432 Malignant neoplasm of lower lobe, left bronchus or lung: Secondary | ICD-10-CM | POA: Diagnosis not present

## 2017-09-01 DIAGNOSIS — J939 Pneumothorax, unspecified: Secondary | ICD-10-CM | POA: Diagnosis not present

## 2017-09-01 DIAGNOSIS — J90 Pleural effusion, not elsewhere classified: Secondary | ICD-10-CM | POA: Diagnosis not present

## 2017-09-01 MED ORDER — ALBUTEROL SULFATE (2.5 MG/3ML) 0.083% IN NEBU
2.5000 mg | INHALATION_SOLUTION | Freq: Four times a day (QID) | RESPIRATORY_TRACT | Status: DC | PRN
  Administered 2017-09-01: 2.5 mg via RESPIRATORY_TRACT
  Filled 2017-09-01: qty 3

## 2017-09-01 NOTE — Progress Notes (Signed)
      [address]       [place] [address]             [phone]       1 Day Post-Op Procedure(s) (LRB): VIDEO BRONCHOSCOPY (N/A)   Subjective:  No new complaints.  Wants to go home.  Objective: Vital signs in last 24 hours: Temp:  [97.4 F (36.3 C)-98.3 F (36.8 C)] 97.4 F (36.3 C) (08/24 0802) Pulse Rate:  [55-106] 61 (08/24 0802) Cardiac Rhythm: Atrial fibrillation (08/24 0701) Resp:  [17-38] 22 (08/24 0802) BP: (79-110)/(42-74) 89/54 (08/24 0802) SpO2:  [91 %-100 %] 95 % (08/24 0802) Weight:  [30.3 kg] 30.3 kg (08/23 1803)  Intake/Output from previous day: 08/23 0701 - 08/24 0700 In: 1578.1 [P.O.:240; I.V.:1263.1] Out: -  Intake/Output this shift: Total I/O In: 957.6 [P.O.:360; I.V.:597.6] Out: -   General appearance: alert, cooperative and cachectic Neurologic: intact Heart: regular rate and rhythm Lungs: wheezes bilaterally and mild Abdomen: soft, non-tender; bowel sounds normal; no masses,  no organomegaly  Lab Results: Recent Labs    08/31/17 1142  WBC 7.9  HGB 10.8*  HCT 36.6  PLT 439*   BMET:  Recent Labs    08/31/17 1142  NA 141  K 3.1*  CL 100  CO2 32  GLUCOSE 91  BUN 7*  CREATININE 0.58  CALCIUM 9.8    PT/INR:  Recent Labs    08/31/17 1142  LABPROT 13.5  INR 1.04   ABG    Component Value Date/Time   TCO2 30 04/23/2012 2216   CBG (last 3)  No results for input(s): GLUCAP in the last 72 hours.  Assessment/Plan: S/P Procedure(s) (LRB): VIDEO BRONCHOSCOPY (N/A)  1. Pneumothorax- appears stable from CXR yesterday 2. Dispo- patients pneumothorax is stable, will plan to d/c home today  LOS: 0 days    Ellwood Handler 09/01/2017

## 2017-09-01 NOTE — Discharge Summary (Addendum)
Physician Discharge Summary  Patient ID: Sherry [name] MRN: [id number] DOB/AGE: 1945-06-21 72 y.o.  Admit date: 08/31/2017 Discharge date: 09/01/2017  Admission Diagnoses:  Patient Active Problem List   Diagnosis Date Noted  . Pneumothorax 08/31/2017  . Primary malignant neoplasm of bronchus of left lower lobe ([place]) 08/26/2017  . Mediastinal mass 08/23/2017   Discharge Diagnoses: Squamous cell carcinoma left lower lobe  Patient Active Problem List   Diagnosis Date Noted  . Pneumothorax 08/31/2017  . Primary malignant neoplasm of bronchus of left lower lobe ([facility]) 08/26/2017  . Mediastinal mass 08/23/2017   Discharged Condition: good  History of Present Illness:  Sherry [name] is a 72 yo white female with known history of 57 py smoking history COPD, depression, anxiety, and a "neuromuscular disorder."  She presented to her PCP in June with complaints of persistent cough and weight loss.  CXR was obtained and showed a left lower lobe mass.  CT scan obtained 07/13/2017 which showed a 7 cm LLL mass and a 7 mm RLL nodule.  PET CT was also obtained and showed the LLL mass to be hypermetabolic.  There was no hilar or mediastinal adenopathy.  She was referred to [facility] and evaluated by Dr. Roxan Hockey at which time she admitted to complaints of shortness of breath at times with minimal activity.  It was felt the mass was most likely a new bronchogenic cancer.  It was felt she should undergo bronchoscopy for definitive diagnosis.  She was not felt to be a candidate for surgical resection.  The risks and benefits of the procedure were explained to the patient and she was agreeable to proceed.      Hospital Course:   Sherry [name] presented to [facility] on 09/01/2017.  Preoperative CXR showed  A right sided pneumothorax.  She was clinically asymptomatic and it was felt safe to proceed with bronchoscopy.  She tolerated the procedure without difficulty.  Due to preoperative pneumothorax she was  admitted for observation overnight.  She remained clinically stable overnight.  Repeat CXR showed stable appearance of pneumothorax.  It was felt she was medically stable for discharge home today.  She was instructed that should she develop sudden onset chest pain and worsening shortness of breath she should present to the ED for further evaluation.  She should follow up with her Oncologist as scheduled.  Pathology remains pending.  Treatments: surgery:   and right pneumothorax.  POSTOPERATIVE DIAGNOSES:  Malignancy left lower lobe, right pneumothorax.  PROCEDURE:  Bronchoscopy with brushings and transbronchial biopsies.  Discharge Exam: Blood pressure (!) 89/54, pulse 61, temperature (!) 97.4 F (36.3 C), temperature source Oral, resp. rate (!) 22, height 5' 0.5" (1.537 m), weight 30.3 kg, SpO2 95 %.  Gen: no apparent distress, appears malnourished Heart: RRR Lungs: mild wheezing bilaterally Abd: soft non-tender, non-distended Neuro: grossly intact  Disposition: Home  Discharge Medications:   Allergies as of 09/01/2017      Reactions   Penicillins Hives   Has patient had a PCN reaction causing immediate rash, facial/tongue/throat swelling, SOB or lightheadedness with hypotension: no Has patient had a PCN reaction causing severe rash involving mucus membranes or skin necrosis: No Has patient had a PCN reaction that required hospitalization: No Has patient had a PCN reaction occurring within the last 10 years: No If all of the above answers are "NO", then may proceed with Cephalosporin use.      Medication List    TAKE these medications   meloxicam 7.5 MG  tablet Commonly known as:  MOBIC Take 1 tablet (7.5 mg total) by mouth daily.   multivitamin with minerals Tabs tablet Take 1 tablet by mouth daily.   naproxen sodium 220 MG tablet Commonly known as:  ALEVE Take 220 mg by mouth daily as needed (headache).   VITAMIN DEFICIENCY SYSTEM-B12 1000 MCG/ML Kit Generic drug:   Cyanocobalamin Inject 1,000 mcg as directed every 30 (thirty) days.        Signed: Ellwood Handler 09/01/2017, 3:26 PM

## 2017-09-01 NOTE — Progress Notes (Signed)
D/c instructions given to pt. All questions answered. IV removed clean and intact. Friend to escort home.  Clyde Canterbury, RN

## 2017-09-01 NOTE — Progress Notes (Signed)
When patient is sleeping H.R. is S.B. Upper 40's to upper 50's .

## 2017-09-03 ENCOUNTER — Encounter: Payer: Self-pay | Admitting: *Deleted

## 2017-09-03 NOTE — Progress Notes (Signed)
Oncology Nurse Navigator Documentation  Oncology Nurse Navigator Flowsheets 09/03/2017  Navigator Location [facility]  Navigator Encounter Type Other/patient had biopsy on 8/23 and pathology is pending.  I updated rad onc she needs a follow up with them.  I updated Dr. Julien Nordmann on patient and he will update me on when she needs to be seen by him.   Treatment Phase Pre-Tx/Tx Discussion  Barriers/Navigation Needs Coordination of Care  Interventions Coordination of Care  Coordination of Care Other  Acuity Level 2  Time Spent with Patient 30

## 2017-09-04 ENCOUNTER — Telehealth: Payer: Self-pay | Admitting: *Deleted

## 2017-09-04 NOTE — Telephone Encounter (Signed)
Oncology Nurse Navigator Documentation  Oncology Nurse Navigator Flowsheets 09/04/2017  Navigator Location [facility]  Navigator Encounter Type Telephone/I called Ms. Sherry [name] to schedule her to see Dr. Julien Nordmann this Friday. I also updated Rad Onc of need for appt.   Telephone Outgoing Call  Treatment Phase Pre-Tx/Tx Discussion  Barriers/Navigation Needs Education;Coordination of Care  Education Other  Interventions Coordination of Care  Coordination of Care Appts  Education Method Verbal  Acuity Level 2  Time Spent with Patient 30

## 2017-09-07 ENCOUNTER — Inpatient Hospital Stay: Payer: PPO | Attending: Internal Medicine | Admitting: Internal Medicine

## 2017-09-07 ENCOUNTER — Telehealth: Payer: Self-pay

## 2017-09-07 ENCOUNTER — Other Ambulatory Visit: Payer: Self-pay | Admitting: Thoracic Surgery (Cardiothoracic Vascular Surgery)

## 2017-09-07 VITALS — BP 112/63 | HR 76 | Temp 98.5°F | Resp 18 | Ht 60.5 in | Wt 71.6 lb

## 2017-09-07 DIAGNOSIS — E440 Moderate protein-calorie malnutrition: Secondary | ICD-10-CM | POA: Diagnosis not present

## 2017-09-07 DIAGNOSIS — Z7189 Other specified counseling: Secondary | ICD-10-CM

## 2017-09-07 DIAGNOSIS — C3432 Malignant neoplasm of lower lobe, left bronchus or lung: Secondary | ICD-10-CM

## 2017-09-07 DIAGNOSIS — J449 Chronic obstructive pulmonary disease, unspecified: Secondary | ICD-10-CM

## 2017-09-07 DIAGNOSIS — F329 Major depressive disorder, single episode, unspecified: Secondary | ICD-10-CM

## 2017-09-07 DIAGNOSIS — Z87891 Personal history of nicotine dependence: Secondary | ICD-10-CM | POA: Diagnosis not present

## 2017-09-07 DIAGNOSIS — F419 Anxiety disorder, unspecified: Secondary | ICD-10-CM | POA: Diagnosis not present

## 2017-09-07 DIAGNOSIS — J9859 Other diseases of mediastinum, not elsewhere classified: Secondary | ICD-10-CM

## 2017-09-07 DIAGNOSIS — C3490 Malignant neoplasm of unspecified part of unspecified bronchus or lung: Secondary | ICD-10-CM

## 2017-09-07 DIAGNOSIS — Z5111 Encounter for antineoplastic chemotherapy: Secondary | ICD-10-CM

## 2017-09-07 DIAGNOSIS — E46 Unspecified protein-calorie malnutrition: Secondary | ICD-10-CM | POA: Diagnosis not present

## 2017-09-07 MED ORDER — METHYLPREDNISOLONE 4 MG PO TBPK: ORAL_TABLET | ORAL | 0 refills | Status: DC

## 2017-09-07 NOTE — Progress Notes (Signed)
[facility] Telephone:([phone]) [phone]   Fax:([phone]  CONSULT NOTE  REFERRING PHYSICIAN: Dr. Modesto Charon.  REASON FOR CONSULTATION:  72 years old white female recently diagnosed with lung cancer.  HPI Sherry [name] is a 72 y.o. female with past medical history significant for seasonal allergy, anxiety, COPD, depression as well as long history of smoking but quit 2 weeks ago.  The patient presented to her primary care provider complaining of cough and wheezing as well as weight loss.  He order CT scan of the chest that was performed on July 13, 2017 and that showed left lower lobe mass along with suspected adjacent consolidation measuring 6.4 x 4.8 x 7.0 cm in the retrohilar region primarily involving the superior segment of the left lower lobe.  There was also left hilar lymph node measuring 1.3 cm in short axis in addition to a small right hilar, subcarinal and AP window lymph nodes present.  The patient had a PET scan performed on 08/03/2017 and that showed hypermetabolic mass in the left lower lobe extends from the hilum inferior medially along the pleural surface measuring 5.1 x 7.0 cm.  The mass is intensely hypermetabolic with SUV max of 10.3.  There was no clear hypermetabolic mediastinal lymph nodes.  The rounded nodule in the right lower lobe measuring 0.6 cm does not have associated metabolic activity.  There was no evidence of distant metastatic disease. The patient was seen by Dr. Roxan Hockey and on 08/31/2017 she underwent bronchoscopy with brushing and transbronchial biopsies under the care of Dr. Roxan Hockey. The final pathology ([id number]) showed the biopsy of the left lower lobe was consistent with poorly differentiated squamous cell carcinoma.  The neoplastic cells were positive for cytokeratin 5-6 and p63 but negative for TTF-1, synaptophysin and CD 56. The patient was not a good surgical candidate for resection and she was referred to me today for  evaluation and recommendation regarding treatment of her condition. When seen today the patient is feeling fine except for fatigue and anxiety.  She also lost more than 20 pounds in the last few months.  She has no nausea, vomiting or diarrhea but has constipation.  She denied having any chest pain, shortness breath but continues to have mild cough with no hemoptysis.  There is no headache or visual changes. Family history significant for mother with diabetes and half sister with cancer. The patient is single and has 3 children. She is to work and Environmental consultant. She has a history for smoking.  She has no history of alcohol or drug abuse.  HPI  Past Medical History:  Diagnosis Date  . Allergy   . Anxiety   . Arthritis   . Cavitating mass in left lower lung lobe   . Complication of anesthesia    woke up during procedure  . COPD (chronic obstructive pulmonary disease) ([place])   . Depression   . Family history of adverse reaction to anesthesia    " my mother was always hard to wake up"  . Full dentures   . Headache   . Neuromuscular disorder ([place])   . Pneumonia   . Wears glasses     Past Surgical History:  Procedure Laterality Date  . ELBOW SURGERY     for  "Tennis Elbow"  . hand surgery    . MULTIPLE TOOTH EXTRACTIONS    . TUBAL LIGATION    . VIDEO BRONCHOSCOPY N/A 08/31/2017   Procedure: VIDEO BRONCHOSCOPY;  Surgeon: Modesto Charon  C, MD;  Location: [facility];  Service: Thoracic;  Laterality: N/A;    Family History  Problem Relation Age of Onset  . Diabetes Mother     Social History Social History   Tobacco Use  . Smoking status: Former Smoker    Packs/day: 1.00    Years: 50.00    Pack years: 50.00    Types: Cigarettes    Last attempt to quit: 08/24/2017    Years since quitting: 0.0  . Smokeless tobacco: Never Used  Substance Use Topics  . Alcohol use: No  . Drug use: No    Allergies  Allergen Reactions  . Penicillins Hives    Has patient had a PCN  reaction causing immediate rash, facial/tongue/throat swelling, SOB or lightheadedness with hypotension: no Has patient had a PCN reaction causing severe rash involving mucus membranes or skin necrosis: No Has patient had a PCN reaction that required hospitalization: No Has patient had a PCN reaction occurring within the last 10 years: No If all of the above answers are "NO", then may proceed with Cephalosporin use.     Current Outpatient Medications  Medication Sig Dispense Refill  . Cyanocobalamin (VITAMIN DEFICIENCY SYSTEM-B12) 1000 MCG/ML KIT Inject 1,000 mcg as directed every 30 (thirty) days.    . meloxicam (MOBIC) 7.5 MG tablet Take 1 tablet (7.5 mg total) by mouth daily. (Patient not taking: Reported on 08/24/2017) 15 tablet 0  . Multiple Vitamin (MULTIVITAMIN WITH MINERALS) TABS tablet Take 1 tablet by mouth daily.    . naproxen sodium (ALEVE) 220 MG tablet Take 220 mg by mouth daily as needed (headache).     No current facility-administered medications for this visit.     Review of Systems  Constitutional: positive for anorexia, fatigue and weight loss Eyes: negative Ears, nose, mouth, throat, and face: negative Respiratory: positive for cough Cardiovascular: negative Gastrointestinal: positive for constipation Genitourinary:negative Integument/breast: negative Hematologic/lymphatic: negative Musculoskeletal:negative Neurological: negative Behavioral/Psych: negative Endocrine: negative Allergic/Immunologic: negative  Physical Exam  QMG:NOIBB, healthy, no distress and malnourished SKIN: skin color, texture, turgor are normal, no rashes or significant lesions HEAD: Normocephalic, No masses, lesions, tenderness or abnormalities EYES: normal, PERRLA, Conjunctiva are pink and non-injected EARS: External ears normal, Canals clear OROPHARYNX:no exudate, no erythema and lips, buccal mucosa, and tongue normal  NECK: supple, no adenopathy, no JVD LYMPH:  no palpable  lymphadenopathy, no hepatosplenomegaly BREAST:not examined LUNGS: decreased breath sounds, prolonged expiratory phase HEART: regular rate & rhythm, no murmurs and no gallops ABDOMEN:abdomen soft, non-tender, normal bowel sounds and no masses or organomegaly BACK: No CVA tenderness, Range of motion is normal EXTREMITIES:no joint deformities, effusion, or inflammation, no edema  NEURO: alert & oriented x 3 with fluent speech, no focal motor/sensory deficits  PERFORMANCE STATUS: ECOG 1  LABORATORY DATA: Lab Results  Component Value Date   WBC 7.9 08/31/2017   HGB 10.8 (L) 08/31/2017   HCT 36.6 08/31/2017   MCV 87.1 08/31/2017   PLT 439 (H) 08/31/2017      Chemistry      Component Value Date/Time   NA 141 08/31/2017 1142   K 3.1 (L) 08/31/2017 1142   CL 100 08/31/2017 1142   CO2 32 08/31/2017 1142   BUN 7 (L) 08/31/2017 1142   CREATININE 0.58 08/31/2017 1142      Component Value Date/Time   CALCIUM 9.8 08/31/2017 1142   ALKPHOS 76 08/31/2017 1142   AST 14 (L) 08/31/2017 1142   ALT 8 08/31/2017 1142   BILITOT 0.7 08/31/2017 1142  RADIOGRAPHIC STUDIES: Dg Chest 2 View  Result Date: 09/01/2017 CLINICAL DATA:  Pneumothorax EXAM: CHEST - 2 VIEW COMPARISON:  Chest radiograph from one day prior. FINDINGS: Stable cardiomediastinal silhouette with normal heart size. Small right apical pneumothorax is not appreciably changed. No left pneumothorax. Small bilateral pleural effusions, left greater than right, slightly increased bilaterally. Hyperinflated lungs. No overt pulmonary edema. Hazy bibasilar lung opacities, stable. IMPRESSION: 1. Stable small right apical pneumothorax. 2. Small bilateral pleural effusions, slightly increased bilaterally. 3. Hyperinflated lungs, suggesting COPD. 4. Hazy bibasilar lung opacities, stable, favor atelectasis, cannot exclude a component of pneumonia on the left. Electronically Signed   By: Ilona Sorrel M.D.   On: 09/01/2017 09:01   Dg Chest 2  View  Result Date: 08/31/2017 CLINICAL DATA:  Lung cancer.  Pre operative exam. EXAM: CHEST - 2 VIEW COMPARISON:  Chest x-ray dated 06/08/2017 and PET-CT dated 08/03/2017 FINDINGS: There is a new 10% right pneumothorax. There is an 8 cm mass in the left lower lobe, increased in size since the prior study. The nodule in the right lower lobe seen on the prior exams is not well seen on this exam. There is a calcification in the right breast overlying the right lung base. Heart size and pulmonary vascularity are normal. Lungs are hyperinflated consistent with emphysema. New pleural thickening at the left lung base laterally. No acute bone abnormality. IMPRESSION: 1. New right apical 10% pneumothorax. 2. Enlarging lung cancer in the left lower lobe. New pleural thickening or loculated effusion at the left lung base laterally. 3.  Emphysema (ICD10-J43.9). 4. Critical Value/emergent results were called by telephone at the time of interpretation on 08/31/2017 at 12:56 pm to Alvy Bimler, RN , who verbally acknowledged these results. Electronically Signed   By: Lorriane Shire M.D.   On: 08/31/2017 12:51   Dg Chest Port 1 View  Result Date: 08/31/2017 CLINICAL DATA:  Right pneumothorax. EXAM: PORTABLE CHEST 1 VIEW COMPARISON:  None. FINDINGS: Stable apical pneumothorax extending to the posterior right fourth rib level similar to prior. No mediastinal shift. Heart and mediastinal contours are stable. Masslike opacity about the infrahilar portion of the left lung is redemonstrated though there is now developing airspace opacities at the left lung base that could represent atelectasis or pneumonia. Stable pleural thickening at the left lung base laterally versus small loculated left effusion. Heart size is normal. There is mild aortic atherosclerosis. IMPRESSION: 1. Stable right apical pneumothorax extending to the fourth posterior rib level as before. No mediastinal shift or tension. 2. Evolving left basilar airspace  disease. Cannot exclude pneumonia. Electronically Signed   By: Ashley Royalty M.D.   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  The patient presented with large superior segment left lower lobe mass with questionable left hilar adenopathy.   PLAN: I had a lengthy discussion with the patient and her family today about her current disease stage, prognosis and treatment options.  The patient was seen by Dr. Roxan Hockey and she is not a good surgical candidate for resection. I discussed with the patient other treatment options and I recommended for her a course of concurrent chemoradiation with weekly carboplatin for AUC of 2 and paclitaxel 45 mg/M2 this could be followed by immunotherapy depending on her response to the induction treatment. I discussed with the patient the  adverse effect of this treatment including but not limited to alopecia, myelosuppression, nausea and vomiting, peripheral neuropathy, liver or renal dysfunction. I will arrange for the patient to have MRI of the brain to complete the staging work-up before starting her treatment. I will refer the patient to radiation oncology for evaluation and discussion of the radiotherapy option. I will arrange for the patient to have a chemotherapy education class before her treatment. She is expected to start the first dose of chemotherapy on 09/17/2017 concurrent with radiation. For the malnutrition and weight loss, I will refer the patient to the dietitian at the cancer center for evaluation and management of her nutrition. The patient will come back for follow-up visit 1 week after her treatment for evaluation and management of any adverse effect of her treatment. I will call her pharmacy with prescription for Compazine 10 mg p.o. every 6 hours as needed  for nausea as well as Medrol Dosepak to stimulate her appetite. She was advised to call immediately if she has any concerning symptoms in the interval.  The patient voices understanding of current disease status and treatment options and is in agreement with the current care plan.  All questions were answered. The patient knows to call the clinic with any problems, questions or concerns. We can certainly see the patient much sooner if necessary.  Thank you so much for allowing me to participate in the care of Sherry [name]. I will continue to follow up the patient with you and assist in her care.  I spent 55 minutes counseling the patient face to face. The total time spent in the appointment was 80 minutes.  Disclaimer: This note was dictated with voice recognition software. Similar sounding words can inadvertently be transcribed and may not be corrected upon review.   Fanny Bien Maryjo Ragon September 07, 2017, 10:00 AM

## 2017-09-07 NOTE — Telephone Encounter (Signed)
Printed avs and calender of upcoming appointment. Per 8/30 los

## 2017-09-07 NOTE — Progress Notes (Signed)
START ON PATHWAY REGIMEN - Non-Small Cell Lung     Administer weekly:     Paclitaxel      Carboplatin   **Always confirm dose/schedule in your pharmacy ordering system**  Patient Characteristics: Stage III - Unresectable, PS = 0, 1 AJCC T Category: T3 Current Disease Status: No Distant Mets or Local Recurrence AJCC N Category: N1 AJCC M Category: M0 AJCC 8 Stage Grouping: IIIA Performance Status: PS = 0, 1 Intent of Therapy: Non-Curative / Palliative Intent, Discussed with Patient

## 2017-09-09 ENCOUNTER — Encounter: Payer: Self-pay | Admitting: Internal Medicine

## 2017-09-09 DIAGNOSIS — Z5111 Encounter for antineoplastic chemotherapy: Secondary | ICD-10-CM | POA: Insufficient documentation

## 2017-09-09 DIAGNOSIS — Z7189 Other specified counseling: Secondary | ICD-10-CM | POA: Insufficient documentation

## 2017-09-09 DIAGNOSIS — E46 Unspecified protein-calorie malnutrition: Secondary | ICD-10-CM | POA: Insufficient documentation

## 2017-09-11 ENCOUNTER — Ambulatory Visit
Admission: RE | Admit: 2017-09-11 | Discharge: 2017-09-11 | Disposition: A | Discharge status: Home / Self Care | Payer: PPO | Source: Ambulatory Visit | Attending: Thoracic Surgery (Cardiothoracic Vascular Surgery) | Admitting: Thoracic Surgery (Cardiothoracic Vascular Surgery)

## 2017-09-11 ENCOUNTER — Other Ambulatory Visit: Payer: Self-pay

## 2017-09-11 ENCOUNTER — Ambulatory Visit (INDEPENDENT_AMBULATORY_CARE_PROVIDER_SITE_OTHER): Payer: PPO | Admitting: Thoracic Surgery (Cardiothoracic Vascular Surgery)

## 2017-09-11 ENCOUNTER — Other Ambulatory Visit: Payer: Self-pay | Admitting: *Deleted

## 2017-09-11 ENCOUNTER — Ambulatory Visit
Admission: RE | Admit: 2017-09-11 | Discharge: 2017-09-11 | Disposition: A | Discharge status: Home / Self Care | Payer: PPO | Source: Ambulatory Visit | Attending: Radiation Oncology | Admitting: Radiation Oncology

## 2017-09-11 ENCOUNTER — Encounter: Payer: Self-pay | Admitting: Thoracic Surgery (Cardiothoracic Vascular Surgery)

## 2017-09-11 VITALS — BP 118/60 | HR 72 | Resp 16 | Ht 60.5 in | Wt 71.0 lb

## 2017-09-11 DIAGNOSIS — C3432 Malignant neoplasm of lower lobe, left bronchus or lung: Secondary | ICD-10-CM | POA: Diagnosis not present

## 2017-09-11 DIAGNOSIS — R918 Other nonspecific abnormal finding of lung field: Secondary | ICD-10-CM

## 2017-09-11 DIAGNOSIS — Z510 Encounter for antineoplastic radiation therapy: Secondary | ICD-10-CM | POA: Diagnosis not present

## 2017-09-11 DIAGNOSIS — J9859 Other diseases of mediastinum, not elsewhere classified: Secondary | ICD-10-CM

## 2017-09-11 DIAGNOSIS — C3490 Malignant neoplasm of unspecified part of unspecified bronchus or lung: Secondary | ICD-10-CM | POA: Diagnosis not present

## 2017-09-11 DIAGNOSIS — R05 Cough: Secondary | ICD-10-CM | POA: Diagnosis not present

## 2017-09-11 MED ORDER — GADOBENATE DIMEGLUMINE 529 MG/ML IV SOLN
6.0000 mL | Freq: Once | INTRAVENOUS | Status: AC | PRN
  Administered 2017-09-11: 6 mL via INTRAVENOUS

## 2017-09-11 NOTE — Progress Notes (Signed)
  Radiation Oncology         [phone] ________________________________  Name: Sherry [name] MRN: [id number]  Date: 09/11/2017  DOB: 1945-10-11  SIMULATION AND TREATMENT PLANNING NOTE    ICD-10-CM   1. 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  Identity was confirmed.  All relevant records and images related to the planned course of therapy were reviewed.  The patient freely provided informed written consent to proceed with treatment after reviewing the details related to the planned course of therapy. The consent form was witnessed and verified by the simulation staff.  Then, the patient was set-up in a stable reproducible  supine position for radiation therapy.  CT images were obtained.  Surface markings were placed.  The CT images were loaded into the planning software.  Then the target and avoidance structures were contoured.  Treatment planning then occurred.  The radiation prescription was entered and confirmed.  Then, I designed and supervised the construction of a total of 6 medically necessary complex treatment devices, including a BodyFix immobilization mold custom fitted to the patient along with 5 multileaf collimators conformally shaped radiation around the treatment target while shielding critical structures such as the heart and spinal cord maximally.  I have requested : 3D Simulation  I have requested a DVH of the following structures: Left lung, right lung, spinal cord, heart, esophagus, and target.  I have ordered:Nutrition Consult  SPECIAL TREATMENT PROCEDURE:  The planned course of therapy using radiation constitutes a special treatment procedure. Special care is required in the management of this patient for the following reasons.  The patient will be receiving concurrent chemotherapy  requiring careful monitoring for increased toxicities of treatment including periodic laboratory values.  The special nature of the planned course of radiotherapy will require increased physician supervision and oversight to ensure patient's safety with optimal treatment outcomes.  PLAN:  The patient will receive 66 Gy in 33 fractions.  ________________________________  Sheral Apley Tammi Klippel, M.D.

## 2017-09-11 NOTE — Progress Notes (Signed)
[address]       ,[place] [id number]             [phone]     HPI: Sherry Sherry [name] returns for follow up after recent bronchoscopy  Sherry Sherry [name] is a 72 year old Sherry [name] with a history of tobacco abuse and COPD.  She recently has had about a 40 pound weight loss and persistent cough.  Work-up revealed a 7 cm left lower lobe mass.  She underwent bronchoscopy and endobronchial ultrasound on 08/31/2017.  On her preoperative chest x-ray she had a small right spontaneous pneumothorax.  We kept her overnight for observation and that was stable.  She went home the following day.  Biopsy showed squamous cell carcinoma.  She had some mild hemoptysis for a couple days afterwards but that is resolved.  She is continued to have a cough and "congestion."  Past Medical History:  Diagnosis Date  . Allergy   . Anxiety   . Arthritis   . Cavitating mass in left lower lung lobe   . Complication of anesthesia    woke up during procedure  . COPD (chronic obstructive pulmonary disease) ([place])   . Depression   . Family history of adverse reaction to anesthesia    " my mother was always hard to wake up"  . Full dentures   . Headache   . Neuromuscular disorder ([place])   . Pneumonia   . Wears glasses     Current Outpatient Medications  Medication Sig Dispense Refill  . Cyanocobalamin (VITAMIN DEFICIENCY SYSTEM-B12) 1000 MCG/ML KIT Inject 1,000 mcg as directed every 30 (thirty) days.    . methylPREDNISolone (MEDROL DOSEPAK) 4 MG TBPK tablet Use as instructed 21 tablet 0  . Multiple Vitamin (MULTIVITAMIN WITH MINERALS) TABS tablet Take 1 tablet by mouth daily.    . naproxen sodium (ALEVE) 220 MG tablet Take 220 mg by mouth daily as needed (headache).    . meloxicam (MOBIC) 7.5 MG tablet Take 1 tablet (7.5 mg total) by mouth daily. (Patient not taking: Reported on 08/24/2017) 15 tablet 0   No current facility-administered medications for this visit.     Physical Exam BP 118/60 (BP  Location: Right Arm, Patient Position: Sitting, Cuff Size: Small)   Pulse 72   Resp 16   Ht 5' 0.5" (1.537 m)   Wt 71 lb (32.2 kg)   SpO2 95% Comment: ON RA  BMI 13.43 kg/m  Sherry Sherry [name] in no acute distress Alert and oriented x3 Lungs faint wheeze bilaterally  Diagnostic Tests: CHEST - 2 VIEW  COMPARISON:  09/01/2017; 08/31/2017; 06/08/2017; chest CT - 07/13/2017; PET-CT - 08/03/2017  FINDINGS: Grossly unchanged cardiac silhouette and mediastinal contours with partial obscuration of left heart border secondary to known consolidative opacity with the superior segment of the left lower lobe. Grossly unchanged small left and trace right-sided pleural effusions. Interval reduction in persistent tiny right apical pneumothorax. No definite left-sided pneumothorax. No evidence of edema.  Improved aeration of left lung base with persistent left basilar opacities, likely atelectasis. The lungs remain hyperexpanded with mild diffuse slightly nodular thickening of the pulmonary interstitium. No new focal airspace opacities. No acute osseus abnormalities.  IMPRESSION: 1. Interval reduction in persistent tiny right apical pneumothorax. 2. Improved aeration of lung bases with persistent small left and trace right-sided effusions with associated bibasilar opacities, likely atelectasis. 3. Similar appearance of known left lower lobe pulmonary mass.   Electronically Signed   By: Eldridge Abrahams.D.  On: 09/11/2017 08:59  Pathology showed squamous cell carcinoma  Impression: Sherry Sherry [name] is a 71 year old Sherry [name] with history of tobacco abuse and COPD who has a a newly diagnosed T3, N0 squamous cell carcinoma.  Biopsies confirmed squamous cell carcinoma.  She is not a surgical candidate and will be treated with chemo and radiation.  She has a great deal of confusion about her schedule.  She is listed as having appointment for radiation simulation this afternoon at 2 PM.  She  was unaware of that.  I will have my office call to confirm that.  She was told to have an MRI of the brain before the ninth.  Apparently that has not been scheduled.  I will have my office attempt to do so.  She will follow-up with Dr. Julien Nordmann and Dr. Sondra Come  I will be happy to see her if I can be of assistance with her care  Melrose Nakayama, MD [facility] [phone]

## 2017-09-12 ENCOUNTER — Telehealth: Payer: Self-pay | Admitting: Emergency Medicine

## 2017-09-12 ENCOUNTER — Encounter: Payer: Self-pay | Admitting: *Deleted

## 2017-09-12 ENCOUNTER — Inpatient Hospital Stay: Payer: PPO | Admitting: Nutrition

## 2017-09-12 ENCOUNTER — Inpatient Hospital Stay: Payer: PPO | Attending: Nurse Practitioner

## 2017-09-12 ENCOUNTER — Inpatient Hospital Stay (HOSPITAL_BASED_OUTPATIENT_CLINIC_OR_DEPARTMENT_OTHER): Payer: PPO | Admitting: Medical

## 2017-09-12 ENCOUNTER — Institutional Professional Consult (permissible substitution): Payer: Self-pay | Admitting: Urology

## 2017-09-12 VITALS — BP 111/68 | HR 75 | Temp 99.1°F | Resp 18 | Ht 60.5 in | Wt 73.9 lb

## 2017-09-12 DIAGNOSIS — F329 Major depressive disorder, single episode, unspecified: Secondary | ICD-10-CM

## 2017-09-12 DIAGNOSIS — M25472 Effusion, left ankle: Secondary | ICD-10-CM | POA: Insufficient documentation

## 2017-09-12 DIAGNOSIS — Z5111 Encounter for antineoplastic chemotherapy: Secondary | ICD-10-CM | POA: Diagnosis not present

## 2017-09-12 DIAGNOSIS — G629 Polyneuropathy, unspecified: Secondary | ICD-10-CM | POA: Insufficient documentation

## 2017-09-12 DIAGNOSIS — M25471 Effusion, right ankle: Secondary | ICD-10-CM

## 2017-09-12 DIAGNOSIS — J449 Chronic obstructive pulmonary disease, unspecified: Secondary | ICD-10-CM | POA: Diagnosis not present

## 2017-09-12 DIAGNOSIS — G5793 Unspecified mononeuropathy of bilateral lower limbs: Secondary | ICD-10-CM

## 2017-09-12 DIAGNOSIS — C3432 Malignant neoplasm of lower lobe, left bronchus or lung: Secondary | ICD-10-CM | POA: Diagnosis not present

## 2017-09-12 DIAGNOSIS — F419 Anxiety disorder, unspecified: Secondary | ICD-10-CM | POA: Diagnosis not present

## 2017-09-12 DIAGNOSIS — Z87891 Personal history of nicotine dependence: Secondary | ICD-10-CM | POA: Insufficient documentation

## 2017-09-12 DIAGNOSIS — K5900 Constipation, unspecified: Secondary | ICD-10-CM | POA: Diagnosis not present

## 2017-09-12 DIAGNOSIS — M79602 Pain in left arm: Secondary | ICD-10-CM | POA: Diagnosis not present

## 2017-09-12 DIAGNOSIS — Z79899 Other long term (current) drug therapy: Secondary | ICD-10-CM | POA: Diagnosis not present

## 2017-09-12 DIAGNOSIS — J440 Chronic obstructive pulmonary disease with acute lower respiratory infection: Secondary | ICD-10-CM | POA: Diagnosis not present

## 2017-09-12 MED ORDER — GABAPENTIN 250 MG/5ML PO SOLN: 150.0000 mg | Freq: Every day | ORAL | 5 refills | Status: DC

## 2017-09-12 NOTE — Progress Notes (Signed)
Pt presents today with reported swelling and numbness in bilat lower legs.  Denies trouble ambulating or dizziness/weakness.  Reports that since her scans a few days ago her legs up to her knees have been numb and that her feet have been swollen.  No swelling palpated or seen.  No injuries or redness present on feet bilat.

## 2017-09-12 NOTE — Telephone Encounter (Signed)
Pt called asking for assistance with neuropathy in legs.  Pt to be seen in  [facility] today, nutrition visit to occur during this.  Pt aware of appt time.

## 2017-09-12 NOTE — Patient Instructions (Signed)
Peripheral Neuropathy Peripheral neuropathy is a type of nerve damage. It affects nerves that carry signals between the spinal cord and other parts of the body. These are called peripheral nerves. With peripheral neuropathy, one nerve or a group of nerves may be damaged. What are the causes? Many things can damage peripheral nerves. For some people with peripheral neuropathy, the cause is unknown. Some causes include:  Diabetes. This is the most common cause of peripheral neuropathy.  Injury to a nerve.  Pressure or stress on a nerve that lasts a long time.  Too little vitamin B. Alcoholism can lead to this.  Infections.  Autoimmune diseases, such as multiple sclerosis and systemic lupus erythematosus.  Inherited nerve diseases.  Some medicines, such as cancer drugs.  Toxic substances, such as lead and mercury.  Too little blood flowing to the legs.  Kidney disease.  Thyroid disease.  What are the signs or symptoms? Different people have different symptoms. The symptoms you have will depend on which of your nerves is damaged. Common symptoms include:  Loss of feeling (numbness) in the feet and hands.  Tingling in the feet and hands.  Pain that burns.  Very sensitive skin.  Weakness.  Not being able to move a part of the body (paralysis).  Muscle twitching.  Clumsiness or poor coordination.  Loss of balance.  Not being able to control your bladder.  Feeling dizzy.  Sexual problems.  How is this diagnosed? Peripheral neuropathy is a symptom, not a disease. Finding the cause of peripheral neuropathy can be hard. To figure that out, your health care provider will take a medical history and do a physical exam. A neurological exam will also be done. This involves checking things affected by your brain, spinal cord, and nerves (nervous system). For example, your health care provider will check your reflexes, how you move, and what you can feel. Other types of tests  may also be ordered, such as:  Blood tests.  A test of the fluid in your spinal cord.  Imaging tests, such as CT scans or an MRI.  Electromyography (EMG). This test checks the nerves that control muscles.  Nerve conduction velocity tests. These tests check how fast messages pass through your nerves.  Nerve biopsy. A small piece of nerve is removed. It is then checked under a microscope.  How is this treated?  Medicine is often used to treat peripheral neuropathy. Medicines may include: ? Pain-relieving medicines. Prescription or over-the-counter medicine may be suggested. ? Antiseizure medicine. This may be used for pain. ? Antidepressants. These also may help ease pain from neuropathy. ? Lidocaine. This is a numbing medicine. You might wear a patch or be given a shot. ? Mexiletine. This medicine is typically used to help control irregular heart rhythms.  Surgery. Surgery may be needed to relieve pressure on a nerve or to destroy a nerve that is causing pain.  Physical therapy to help movement.  Assistive devices to help movement. Follow these instructions at home:  Only take over-the-counter or prescription medicines as directed by your health care provider. Follow the instructions carefully for any given medicines. Do not take any other medicines without first getting approval from your health care provider.  If you have diabetes, work closely with your health care provider to keep your blood sugar under control.  If you have numbness in your feet: ? Check every day for signs of injury or infection. Watch for redness, warmth, and swelling. ? Wear padded socks and comfortable   shoes. These help protect your feet.  Do not do things that put pressure on your damaged nerve.  Do not smoke. Smoking keeps blood from getting to damaged nerves.  Avoid or limit alcohol. Too much alcohol can cause a lack of B vitamins. These vitamins are needed for healthy nerves.  Develop a good  support system. Coping with peripheral neuropathy can be stressful. Talk to a mental health specialist or join a support group if you are struggling.  Follow up with your health care provider as directed. Contact a health care provider if:  You have new signs or symptoms of peripheral neuropathy.  You are struggling emotionally from dealing with peripheral neuropathy.  You have a fever. Get help right away if:  You have an injury or infection that is not healing.  You feel very dizzy or begin vomiting.  You have chest pain.  You have trouble breathing. This information is not intended to replace advice given to you by your health care provider. Make sure you discuss any questions you have with your health care provider. Document Released: 12/16/2001 Document Revised: 06/03/2015 Document Reviewed: 09/02/2012 Elsevier Interactive Patient Education  2017 [employer]  

## 2017-09-12 NOTE — Progress Notes (Signed)
72 year old female diagnosed with lung cancer.  She is a patient of Dr. Julien Nordmann and Dr. Tammi Klippel.  Past medical history includes tobacco, COPD, depression, anxiety, and dentures.  Medications include vitamin B12, multivitamin, and prednisone.  Labs potassium 3.1 and albumin 2.6 on August 23.  Height: 5 feet 1/2 inch. Weight: 73.9 pounds September 4. Usual body weight: Approximately 100 pounds per patient. BMI: 14.2.  Underweight.  Patient reports she is eating well at this time. Reports she can taste food and smell food a lot better since she is quit smoking. Patient is asking what she should not eat.  Nutrition diagnosis:  Severe malnutrition in the context of acute illness as evidenced by 15% weight loss in 1 month.  Patient also has severe muscle loss and fat loss.  Intervention: Educated patient to consume small frequent meals and snacks consisting of high-calorie high-protein foods. Reviewed high-protein foods and provided suggestions for snacks. Encourage patient to increase fluid intake and monitor for constipation. Fact sheets were provided. Recommended patient try oral nutrition supplements.  Provided samples and coupons.  Monitoring, evaluation, goals: Patient will tolerate increased calories and protein to promote weight gain and lean body mass.  Next visit: Monday, September 16 during infusion.  **Disclaimer: This note was dictated with voice recognition software. Similar sounding words can inadvertently be transcribed and this note may contain transcription errors which may not have been corrected upon publication of note.**

## 2017-09-13 ENCOUNTER — Other Ambulatory Visit: Payer: PPO

## 2017-09-13 ENCOUNTER — Other Ambulatory Visit: Payer: Self-pay | Admitting: *Deleted

## 2017-09-13 DIAGNOSIS — Z510 Encounter for antineoplastic radiation therapy: Secondary | ICD-10-CM | POA: Diagnosis not present

## 2017-09-13 DIAGNOSIS — C3432 Malignant neoplasm of lower lobe, left bronchus or lung: Secondary | ICD-10-CM | POA: Diagnosis not present

## 2017-09-14 ENCOUNTER — Encounter: Payer: Self-pay | Admitting: *Deleted

## 2017-09-14 DIAGNOSIS — Z510 Encounter for antineoplastic radiation therapy: Secondary | ICD-10-CM | POA: Diagnosis not present

## 2017-09-14 DIAGNOSIS — C3432 Malignant neoplasm of lower lobe, left bronchus or lung: Secondary | ICD-10-CM | POA: Diagnosis not present

## 2017-09-14 NOTE — Progress Notes (Signed)
[facility] Progress Note   TANIYA DASHER [id number] 26-Nov-1945 72 y.o.  SHAELEIGH GRAW is managed by Dr. Fanny Bien. Mohamed  Actively treated with chemotherapy/immunotherapy: no   Assessment: Plan:    Ankle edema, bilateral  Neuropathy involving both lower extremities - Plan: gabapentin (NEURONTIN) 250 MG/5ML solution  Primary malignant neoplasm of bronchus of left lower lobe (HCC)   Bilateral ankle edema: I attempted to reassure the patient that her ankles do not look markedly swollen.  Her calves are bilaterally equal in size.  She we will follow-up as needed.  Bilateral lower extremity neuropathy: Patient was given a prescription for gabapentin 250 mg per 5 ML with instruction to take 3 ML p.o. Nightly.  Primary malignant neoplasm of the bronchus of the left lower lobe: Ms. Haxton was seen for a nurse education visit and for a dietitian consult.  She is scheduled for her first chemotherapy on 09/17/2017 and will be seen in follow-up on 09/24/2017.  Please see After Visit Summary for patient specific instructions.  Future Appointments  Date Time Provider [place]  09/17/2017  8:00 AM [facility] LAB 2 [facility] None  09/17/2017  8:30 AM [facility] INFUSION [facility] None  09/17/2017  4:50 PM [facility] [id number] [facility] None  09/18/2017  4:50 PM [facility] [id number] [facility] None  09/19/2017  3:00 PM [facility] [id number] [facility] None  09/20/2017  3:00 PM [facility] [id number] [facility] None  09/21/2017  3:00 PM [facility] [id number] [facility] None  09/24/2017 10:00 AM [facility] [id number] [facility] None  09/24/2017 10:30 AM [facility] LAB ONLY [facility] None  09/24/2017 11:00 AM Cira Rue K, NP [facility] None  09/24/2017 12:00 PM [facility] INFUSION [facility] None  09/24/2017  2:30 PM Neff, Barbara L, RD [facility] None  09/25/2017  3:00 PM [facility] [id number] [facility] None  09/26/2017  3:00 PM [facility] [id number]  [facility] None  09/27/2017  3:00 PM [facility] [id number] [facility] None  09/28/2017  3:00 PM [facility] [id number] [facility] None  10/01/2017  7:45 AM [facility] LAB 5 [facility] None  10/01/2017  8:15 AM [facility] INFUSION [facility] None  10/01/2017  3:00 PM [facility] [id number] [facility] None  10/02/2017  3:00 PM [facility] [id number] [facility] None  10/03/2017  3:00 PM [facility] [id number] [facility] None  10/04/2017  3:00 PM [facility] [id number] [facility] None  10/05/2017  3:00 PM [facility] [id number] [facility] None  10/08/2017  3:00 PM [facility] [id number] [facility] None  10/09/2017  3:00 PM [facility] [id number] [facility] None  10/10/2017  3:00 PM [facility] [id number] [facility] None  10/11/2017  3:00 PM [facility] [id number] [facility] None  10/12/2017  3:00 PM [facility] [id number] [facility] None  10/15/2017  3:00 PM [facility] [id number] [facility] None  10/16/2017  3:00 PM [facility] [id number] [facility] None  10/17/2017  3:00 PM [facility] [id number] [facility] None  10/18/2017  3:00 PM [facility] [id number] [facility] None  10/19/2017  3:00 PM [facility] [id number] [facility] None  10/22/2017  3:00 PM [facility] [id number] [facility] None  10/23/2017  3:00 PM [facility] [id number] [facility] None  10/24/2017  3:00 PM [facility] [id number] [facility] None  10/25/2017  3:00 PM [facility] [id number] [facility] None  10/26/2017  3:00 PM [facility] [id number] [facility] None  10/29/2017  3:00 PM [facility] [id number] [facility] None  10/30/2017  3:00 PM [facility] [id number] [facility] None  10/31/2017  3:00 PM [facility] [id number] [facility] None    No orders of the defined types were placed in this encounter.      Subjective:   Patient ID:  DIM MEISINGER is a 72 y.o. (DOB [birth year]) female.  Chief Complaint:  Chief Complaint  Patient presents with  . Numbness    HPI SCOUT GUYETT is a 72 year old female with a newly diagnosed  stage IIb/IIIa (T3, N0/N1, M0) non-small cell lung cancer poorly differentiated squamous cell carcinoma who is seen by Dr. Fanny Bien. Mohamed.  She is pending start of chemotherapy on 09/17/2017.  She presents to the office today for her nurse education visit and for a dietitian consult.  She is concerned that her feet and ankles were swollen bilaterally.  She also reports having ongoing numbness and tingling in her lower extremities to her knees bilaterally.  She continues on steroids.  She denies any other issues of concern.  Medications: I have reviewed the patient's current medications.  Allergies:  Allergies  Allergen Reactions  . Penicillins Hives    Has patient had a PCN reaction causing immediate rash, facial/tongue/throat swelling, SOB or lightheadedness with hypotension: no Has patient had a PCN reaction causing severe rash involving mucus membranes or skin necrosis: No Has patient had a PCN reaction that required hospitalization: No Has patient had a PCN reaction occurring within the last 10 years: No If all of the above answers are "NO", then may proceed with Cephalosporin use.     Past Medical History:  Diagnosis Date  . Allergy   . Anxiety   . Arthritis   . Cavitating mass in left lower lung lobe   . Complication of anesthesia    woke up during procedure  . COPD (chronic obstructive pulmonary disease) ([place])   . Depression   . Family history of adverse reaction to anesthesia    " my mother was always hard to wake up"  . Full dentures   . Headache   . Neuromuscular disorder ([place])   . Pneumonia   . Wears glasses     Past Surgical History:  Procedure Laterality Date  . ELBOW SURGERY     for  "Tennis Elbow"  . hand surgery    . MULTIPLE TOOTH EXTRACTIONS    . TUBAL LIGATION    . VIDEO BRONCHOSCOPY N/A 08/31/2017   Procedure: VIDEO BRONCHOSCOPY;  Surgeon: Melrose Nakayama, MD;  Location: [facility] OR;  Service: Thoracic;  Laterality: N/A;    Family History  Problem  Relation Age of Onset  . Diabetes Mother     Social History   Socioeconomic History  . Marital status: Divorced    Spouse name: Not on file  . Number of children: Not on file  . Years of education: Not on file  . Highest education level: Not on file  Occupational History  . Not on file  Social Needs  . Financial resource strain: Not on file  . Food insecurity:    Worry: Not on file    Inability: Not on file  . Transportation needs:    Medical: Not on file    Non-medical: Not on file  Tobacco Use  . Smoking status: Former Smoker    Packs/day: 1.00    Years: 50.00    Pack years: 50.00    Types: Cigarettes    Last attempt to quit: 08/24/2017    Years since quitting: 0.0  . Smokeless tobacco: Never Used  Substance and Sexual Activity  . Alcohol use: No  . Drug use: No  . Sexual activity: Never  Lifestyle  . Physical activity:    Days per week: Not on file    Minutes per session: Not on file  . Stress: Not on file  Relationships  .  Social connections:    Talks on phone: Not on file    Gets together: Not on file    Attends religious service: Not on file    Active member of club or organization: Not on file    Attends meetings of clubs or organizations: Not on file    Relationship status: Not on file  . Intimate partner violence:    Fear of current or ex partner: Not on file    Emotionally abused: Not on file    Physically abused: Not on file    Forced sexual activity: Not on file  Other Topics Concern  . Not on file  Social History Narrative  . Not on file    Past Medical History, Surgical history, Social history, and Family history were reviewed and updated as appropriate.   Please see review of systems for further details on the patient's review from today.   Review of Systems:  Review of Systems  Constitutional: Negative for chills, diaphoresis and fever.  HENT: Negative for trouble swallowing and voice change.   Respiratory: Negative for cough, chest  tightness, shortness of breath and wheezing.   Cardiovascular: Positive for leg swelling. Negative for chest pain and palpitations.  Gastrointestinal: Negative for abdominal pain, constipation, diarrhea, nausea and vomiting.  Musculoskeletal: Negative for back pain and myalgias.  Neurological: Positive for numbness. Negative for dizziness, light-headedness and headaches.    Objective:   Physical Exam:  BP 111/68 (BP Location: Left Arm, Patient Position: Sitting)   Pulse 75   Temp 99.1 F (37.3 C) (Oral)   Resp 18   Ht 5' 0.5" (1.537 m)   Wt 73 lb 14.4 oz (33.5 kg)   SpO2 96%   BMI 14.20 kg/m  ECOG: 0  Physical Exam  Constitutional: No distress.  The patient is an elderly chronically ill-appearing female who appears to be in no apparent distress.  HENT:  Head: Normocephalic and atraumatic.  Cardiovascular: Normal rate, regular rhythm and normal heart sounds. Exam reveals no gallop and no friction rub.  No murmur heard. Pulses:      Dorsalis pedis pulses are 2+ on the right side, and 2+ on the left side.       Posterior tibial pulses are 1+ on the right side, and 1+ on the left side.  Pulmonary/Chest: Effort normal and breath sounds normal. No stridor. No respiratory distress. She has no wheezes. She has no rales.  Musculoskeletal: She exhibits edema (1+ pitting edema over the medial ankles bilaterally.  Calves are equal bilaterally.).  Neurological: She is alert. Coordination normal.  Skin: Skin is warm and dry. No rash noted. She is not diaphoretic. No erythema. No pallor.  Psychiatric: She has a normal mood and affect. Her behavior is normal. Judgment and thought content normal.    Lab Review:     Component Value Date/Time   NA 141 08/31/2017 1142   K 3.1 (L) 08/31/2017 1142   CL 100 08/31/2017 1142   CO2 32 08/31/2017 1142   GLUCOSE 91 08/31/2017 1142   BUN 7 (L) 08/31/2017 1142   CREATININE 0.58 08/31/2017 1142   CALCIUM 9.8 08/31/2017 1142   PROT 6.5 08/31/2017  1142   ALBUMIN 2.6 (L) 08/31/2017 1142   AST 14 (L) 08/31/2017 1142   ALT 8 08/31/2017 1142   ALKPHOS 76 08/31/2017 1142   BILITOT 0.7 08/31/2017 1142   GFRNONAA >60 08/31/2017 1142   GFRAA >60 08/31/2017 1142       Component Value Date/Time  WBC 7.9 08/31/2017 1142   RBC 4.20 08/31/2017 1142   HGB 10.8 (L) 08/31/2017 1142   HCT 36.6 08/31/2017 1142   PLT 439 (H) 08/31/2017 1142   MCV 87.1 08/31/2017 1142   MCV 91.9 04/23/2012 1938   MCH 25.7 (L) 08/31/2017 1142   MCHC 29.5 (L) 08/31/2017 1142   RDW 15.5 08/31/2017 1142   -------------------------------  Imaging from last 24 hours (if applicable):  Radiology interpretation: Dg Chest 2 View  Result Date: 09/11/2017 CLINICAL DATA:  Mediastinal mass. Productive cough. History of COPD. Former smoker. EXAM: CHEST - 2 VIEW COMPARISON:  09/01/2017; 08/31/2017; 06/08/2017; chest CT - 07/13/2017; PET-CT - 08/03/2017 FINDINGS: Grossly unchanged cardiac silhouette and mediastinal contours with partial obscuration of left heart border secondary to known consolidative opacity with the superior segment of the left lower lobe. Grossly unchanged small left and trace right-sided pleural effusions. Interval reduction in persistent tiny right apical pneumothorax. No definite left-sided pneumothorax. No evidence of edema. Improved aeration of left lung base with persistent left basilar opacities, likely atelectasis. The lungs remain hyperexpanded with mild diffuse slightly nodular thickening of the pulmonary interstitium. No new focal airspace opacities. No acute osseus abnormalities. IMPRESSION: 1. Interval reduction in persistent tiny right apical pneumothorax. 2. Improved aeration of lung bases with persistent small left and trace right-sided effusions with associated bibasilar opacities, likely atelectasis. 3. Similar appearance of known left lower lobe pulmonary mass. Electronically Signed   By: Sandi Mariscal M.D.   On: 09/11/2017 08:59   Dg Chest 2  View  Result Date: 09/01/2017 CLINICAL DATA:  Pneumothorax EXAM: CHEST - 2 VIEW COMPARISON:  Chest radiograph from one day prior. FINDINGS: Stable cardiomediastinal silhouette with normal heart size. Small right apical pneumothorax is not appreciably changed. No left pneumothorax. Small bilateral pleural effusions, left greater than right, slightly increased bilaterally. Hyperinflated lungs. No overt pulmonary edema. Hazy bibasilar lung opacities, stable. IMPRESSION: 1. Stable small right apical pneumothorax. 2. Small bilateral pleural effusions, slightly increased bilaterally. 3. Hyperinflated lungs, suggesting COPD. 4. Hazy bibasilar lung opacities, stable, favor atelectasis, cannot exclude a component of pneumonia on the left. Electronically Signed   By: Ilona Sorrel M.D.   On: 09/01/2017 09:01   Dg Chest 2 View  Result Date: 08/31/2017 CLINICAL DATA:  Lung cancer.  Pre operative exam. EXAM: CHEST - 2 VIEW COMPARISON:  Chest x-ray dated 06/08/2017 and PET-CT dated 08/03/2017 FINDINGS: There is a new 10% right pneumothorax. There is an 8 cm mass in the left lower lobe, increased in size since the prior study. The nodule in the right lower lobe seen on the prior exams is not well seen on this exam. There is a calcification in the right breast overlying the right lung base. Heart size and pulmonary vascularity are normal. Lungs are hyperinflated consistent with emphysema. New pleural thickening at the left lung base laterally. No acute bone abnormality. IMPRESSION: 1. New right apical 10% pneumothorax. 2. Enlarging lung cancer in the left lower lobe. New pleural thickening or loculated effusion at the left lung base laterally. 3.  Emphysema (ICD10-J43.9). 4. Critical Value/emergent results were called by telephone at the time of interpretation on 08/31/2017 at 12:56 pm to Alvy Bimler, RN , who verbally acknowledged these results. Electronically Signed   By: Lorriane Shire M.D.   On: 08/31/2017 12:51   Mr Jeri Cos ZO Contrast  Result Date: 09/11/2017 CLINICAL DATA:  72 year old female recently diagnosed with lung cancer. Staging. EXAM: MRI HEAD WITHOUT AND WITH CONTRAST TECHNIQUE:  Multiplanar, multiecho pulse sequences of the brain and surrounding structures were obtained without and with intravenous contrast. CONTRAST:  68mL MULTIHANCE GADOBENATE DIMEGLUMINE 529 MG/ML IV SOLN COMPARISON:  PET-CT 08/03/2017. FINDINGS: Brain: No abnormal enhancement identified. No midline shift, mass effect, or evidence of intracranial mass lesion. No dural thickening. No restricted diffusion to suggest acute infarction. No ventriculomegaly, extra-axial collection or acute intracranial hemorrhage. Cervicomedullary junction and pituitary are within normal limits. Pearline Cables and white matter signal is within normal limits for age throughout the brain. Vascular: Major intracranial vascular flow voids are preserved. The major dural venous sinuses are enhancing and appear patent. Skull and upper cervical spine: Visualized bone marrow signal is within normal limits. There are scattered small benign T1 intrinsic hemangiomas of the calvarium (series 11, image 127). Negative visible cervical spine and spinal cord. Sinuses/Orbits: Normal orbits soft tissues. Paranasal sinuses are clear. Other: Mild mastoid effusions. Negative nasopharynx. Visible internal auditory structures appear normal. Scalp and face soft tissues appear negative. IMPRESSION: 1. No metastatic disease or acute intracranial abnormality. Normal for age MRI appearance of the brain. 2. Mild bilateral mastoid effusions are probably postinflammatory and appear inconsequential. Electronically Signed   By: Genevie Ann M.D.   On: 09/11/2017 13:14   Dg Chest Port 1 View  Result Date: 08/31/2017 CLINICAL DATA:  Right pneumothorax. EXAM: PORTABLE CHEST 1 VIEW COMPARISON:  None. FINDINGS: Stable apical pneumothorax extending to the posterior right fourth rib level similar to prior. No mediastinal  shift. Heart and mediastinal contours are stable. Masslike opacity about the infrahilar portion of the left lung is redemonstrated though there is now developing airspace opacities at the left lung base that could represent atelectasis or pneumonia. Stable pleural thickening at the left lung base laterally versus small loculated left effusion. Heart size is normal. There is mild aortic atherosclerosis. IMPRESSION: 1. Stable right apical pneumothorax extending to the fourth posterior rib level as before. No mediastinal shift or tension. 2. Evolving left basilar airspace disease. Cannot exclude pneumonia. Electronically Signed   By: Ashley Royalty M.D.   On: 08/31/2017 16:56

## 2017-09-16 ENCOUNTER — Other Ambulatory Visit: Payer: Self-pay

## 2017-09-16 ENCOUNTER — Emergency Department (HOSPITAL_COMMUNITY): Payer: PPO

## 2017-09-16 ENCOUNTER — Encounter (HOSPITAL_COMMUNITY): Payer: Self-pay

## 2017-09-16 ENCOUNTER — Emergency Department (HOSPITAL_COMMUNITY)
Admission: EM | Admit: 2017-09-16 | Discharge: 2017-09-17 | Disposition: A | Discharge status: Home / Self Care | Payer: PPO | Attending: Emergency Medicine | Admitting: Emergency Medicine

## 2017-09-16 DIAGNOSIS — R918 Other nonspecific abnormal finding of lung field: Secondary | ICD-10-CM | POA: Diagnosis not present

## 2017-09-16 DIAGNOSIS — R002 Palpitations: Secondary | ICD-10-CM

## 2017-09-16 DIAGNOSIS — Z85118 Personal history of other malignant neoplasm of bronchus and lung: Secondary | ICD-10-CM | POA: Insufficient documentation

## 2017-09-16 DIAGNOSIS — I959 Hypotension, unspecified: Secondary | ICD-10-CM | POA: Diagnosis not present

## 2017-09-16 DIAGNOSIS — R000 Tachycardia, unspecified: Secondary | ICD-10-CM | POA: Diagnosis not present

## 2017-09-16 DIAGNOSIS — J90 Pleural effusion, not elsewhere classified: Secondary | ICD-10-CM | POA: Diagnosis not present

## 2017-09-16 DIAGNOSIS — Z87891 Personal history of nicotine dependence: Secondary | ICD-10-CM | POA: Diagnosis not present

## 2017-09-16 DIAGNOSIS — Z79899 Other long term (current) drug therapy: Secondary | ICD-10-CM | POA: Insufficient documentation

## 2017-09-16 DIAGNOSIS — R52 Pain, unspecified: Secondary | ICD-10-CM

## 2017-09-16 DIAGNOSIS — J449 Chronic obstructive pulmonary disease, unspecified: Secondary | ICD-10-CM | POA: Diagnosis not present

## 2017-09-16 DIAGNOSIS — C3432 Malignant neoplasm of lower lobe, left bronchus or lung: Secondary | ICD-10-CM | POA: Diagnosis not present

## 2017-09-16 DIAGNOSIS — R0602 Shortness of breath: Secondary | ICD-10-CM | POA: Diagnosis not present

## 2017-09-16 DIAGNOSIS — R42 Dizziness and giddiness: Secondary | ICD-10-CM | POA: Diagnosis not present

## 2017-09-16 LAB — CBC
HCT: 35.7 % — ABNORMAL LOW (ref 36.0–46.0)
Hemoglobin: 10.3 g/dL — ABNORMAL LOW (ref 12.0–15.0)
MCH: 25.4 pg — ABNORMAL LOW (ref 26.0–34.0)
MCHC: 28.9 g/dL — ABNORMAL LOW (ref 30.0–36.0)
MCV: 88.1 fL (ref 78.0–100.0)
Platelets: 507 10*3/uL — ABNORMAL HIGH (ref 150–400)
RBC: 4.05 MIL/uL (ref 3.87–5.11)
RDW: 17.1 % — AB (ref 11.5–15.5)
WBC: 15.5 10*3/uL — AB (ref 4.0–10.5)

## 2017-09-16 LAB — TROPONIN I: Troponin I: <0.03 ng/mL (ref <0.03)

## 2017-09-16 LAB — BASIC METABOLIC PANEL
Anion gap: 11 (ref 5–15)
BUN: 9 mg/dL (ref 8–23)
CO2: 29 mmol/L (ref 22–32)
Calcium: 9.1 mg/dL (ref 8.9–10.3)
Chloride: 103 mmol/L (ref 98–111)
Creatinine, Ser: 0.58 mg/dL (ref 0.44–1.00)
GFR calc Af Amer: >60 mL/min (ref >60)
GFR calc non Af Amer: >60 mL/min (ref >60)
Glucose, Bld: 101 mg/dL — ABNORMAL HIGH (ref 70–99)
Potassium: 3.3 mmol/L — ABNORMAL LOW (ref 3.5–5.1)
SODIUM: 143 mmol/L (ref 135–145)

## 2017-09-16 LAB — D-DIMER, QUANTITATIVE (NOT AT ARMC): D DIMER QUANT: 1.93 ug{FEU}/mL — AB (ref 0.00–0.50)

## 2017-09-16 MED ORDER — SODIUM CHLORIDE 0.9 % IV BOLUS (SEPSIS)
500.0000 mL | Freq: Once | INTRAVENOUS | Status: AC
  Administered 2017-09-16: 500 mL via INTRAVENOUS

## 2017-09-16 MED ORDER — SODIUM CHLORIDE 0.9 % IV SOLN
1000.0000 mL | INTRAVENOUS | Status: DC
  Administered 2017-09-16: 1000 mL via INTRAVENOUS

## 2017-09-16 MED ORDER — IOPAMIDOL (ISOVUE-370) INJECTION 76%
INTRAVENOUS | Status: AC
  Administered 2017-09-16: 80 mL
  Filled 2017-09-16: qty 100

## 2017-09-16 NOTE — ED Notes (Signed)
Pt returned from CT °

## 2017-09-16 NOTE — ED Triage Notes (Signed)
Pt brought in by [facility] from home for palpiations. Pt HR noted to be around 200. Per EMS pt given fluids which improved HR to 150. Pt BP 90/60, pt states this is her normal. Pt A+Ox4 and ambulatory. Pt has no complaints on arrival.

## 2017-09-16 NOTE — ED Notes (Signed)
Delay in lab pt in xray...nurse will collect labs when pt returns.

## 2017-09-16 NOTE — ED Provider Notes (Signed)
[facility] EMERGENCY DEPARTMENT Provider Note   CSN: [id number] Arrival date & time: 09/16/17  1906     History   Chief Complaint Chief Complaint  Patient presents with  . Palpitations    HPI Sherry [name] is a 72 y.o. female.  HPI Patient reports having intermittent episodes of palpitations and lightheadedness today.  Patient states when she woke up today she had an episode where she felt very lightheaded and felt her heart pounding.  This lasted a few minutes and then resolved.  She had another episode later in the day when she was shopping.  Then this evening she had 4 episodes most back to back.  Patient then decided to come to the emergency room for evaluation.  When EMS arrived they noted a heart rate was in the 200s.  Patient was given IV fluids with improvement.  She denies any trouble with chest pain or shortness of breath.  She is not feeling lightheaded at this moment.  She denies any vomiting or diarrhea.  No fevers or chills.  Does have a history of COPD and a recent diagnosis of lung cancer.  She quit smoking 1 month ago.  She is scheduled for some type of treatment tomorrow.  Patient denies any history of DVT, PE or cardiac abnormalities. Past Medical History:  Diagnosis Date  . Allergy   . Anxiety   . Arthritis   . Cavitating mass in left lower lung lobe   . Complication of anesthesia    woke up during procedure  . COPD (chronic obstructive pulmonary disease) ([place])   . Depression   . Family history of adverse reaction to anesthesia    " my mother was always hard to wake up"  . Full dentures   . Headache   . Neuromuscular disorder ([place])   . Pneumonia   . Wears glasses     Patient Active Problem List   Diagnosis Date Noted  . Encounter for antineoplastic chemotherapy 09/09/2017  . Goals of care, counseling/discussion 09/09/2017  . Malnutrition ([place]) 09/09/2017  . Pneumothorax 08/31/2017  . Primary malignant neoplasm of bronchus of left  lower lobe ([place]) 08/26/2017  . Mediastinal mass 08/23/2017    Past Surgical History:  Procedure Laterality Date  . ELBOW SURGERY     for  "Tennis Elbow"  . hand surgery    . MULTIPLE TOOTH EXTRACTIONS    . TUBAL LIGATION    . VIDEO BRONCHOSCOPY N/A 08/31/2017   Procedure: VIDEO BRONCHOSCOPY;  Surgeon: Melrose Nakayama, MD;  [facility] OR;  Service: Thoracic;  Laterality: N/A;     OB History   None      Home Medications    Prior to Admission medications   Medication Sig Start Date End Date Taking? Authorizing Provider  cyanocobalamin (,VITAMIN B-12,) 1000 MCG/ML injection Inject 1,000 mcg into the muscle every 30 (thirty) days. Administered by Dr. Eldridge Abrahams   Yes [provider]  guaifenesin (ROBITUSSIN) 100 MG/5ML syrup Take by mouth daily.   Yes [provider]  Menthol-Ascorbic Acid (LUDENS COUGH DROPS MT) Use as directed 1 lozenge in the mouth or throat 3 (three) times daily.    Yes [provider]  methylPREDNISolone (MEDROL DOSEPAK) 4 MG TBPK tablet Use as instructed Patient taking differently: Take 4 mg by mouth See admin instructions. 6 day dose pak - take as directed by package directions 09/07/17  Yes Curt Bears, MD  Multiple Vitamin (MULTIVITAMIN WITH MINERALS) TABS tablet Take 1  tablet by mouth daily.   Yes [provider]  naproxen sodium (ALEVE) 220 MG tablet Take 220 mg by mouth daily as needed (headache).   Yes [provider]  nystatin (MYCOSTATIN) 100000 UNIT/ML suspension Take 5 mLs by mouth See admin instructions. Swish and spit 5 mls daily 08/27/17  Yes [provider]  ENSURE (ENSURE) Take 237 mLs by mouth daily.    [provider]  gabapentin (NEURONTIN) 250 MG/5ML solution Take 3 mLs (150 mg total) by mouth at bedtime. Patient not taking: Reported on 09/16/2017 09/12/17   Harle Stanford., PA-C  meloxicam (MOBIC) 7.5 MG tablet Take 1 tablet (7.5 mg total) by mouth daily. Patient not  taking: Reported on 08/24/2017 12/29/15   Mancel Bale, PA-C    Family History Family History  Problem Relation Age of Onset  . Diabetes Mother     Social History Social History   Tobacco Use  . Smoking status: Former Smoker    Packs/day: 1.00    Years: 50.00    Pack years: 50.00    Types: Cigarettes    Last attempt to quit: 08/24/2017    Years since quitting: 0.0  . Smokeless tobacco: Never Used  Substance Use Topics  . Alcohol use: No  . Drug use: No     Allergies   Penicillins   Review of Systems Review of Systems  All other systems reviewed and are negative.    Physical Exam Updated Vital Signs BP 113/79   Pulse 88   Resp (!) 28   Ht 1.537 m (5' 0.5")   Wt 33.5 kg   SpO2 98%   BMI 14.19 kg/m   Physical Exam  Constitutional: No distress.  Elderly, frail  HENT:  Head: Normocephalic and atraumatic.  Right Ear: External ear normal.  Left Ear: External ear normal.  Eyes: Conjunctivae are normal. Right eye exhibits no discharge. Left eye exhibits no discharge. No scleral icterus.  Neck: Neck supple. No tracheal deviation present.  Cardiovascular: Regular rhythm and intact distal pulses. Tachycardia present.  Pulmonary/Chest: Effort normal and breath sounds normal. No stridor. No respiratory distress. She has no wheezes. She has no rales.  Abdominal: Soft. Bowel sounds are normal. She exhibits no distension. There is no tenderness. There is no rebound and no guarding.  Musculoskeletal: She exhibits no edema or tenderness.  Neurological: She is alert. She has normal strength. No cranial nerve deficit (no facial droop, extraocular movements intact, no slurred speech) or sensory deficit. She exhibits normal muscle tone. She displays no seizure activity. Coordination normal.  Skin: Skin is warm and dry. No rash noted.  Psychiatric: She has a normal mood and affect.  Nursing note and vitals reviewed.    ED Treatments / Results  Labs (all labs ordered are  listed, but only abnormal results are displayed) Labs Reviewed  BASIC METABOLIC PANEL - Abnormal; Notable for the following components:      Result Value   Potassium 3.3 (*)    Glucose, Bld 101 (*)    All other components within normal limits  CBC - Abnormal; Notable for the following components:   WBC 15.5 (*)    Hemoglobin 10.3 (*)    HCT 35.7 (*)    MCH 25.4 (*)    MCHC 28.9 (*)    RDW 17.1 (*)    Platelets 507 (*)    All other components within normal limits  D-DIMER, QUANTITATIVE (NOT AT [facility]) - Abnormal; Notable for the following components:  D-Dimer, Quant 1.93 (*)    All other components within normal limits  TROPONIN I    EKG EKG Interpretation  Date/Time:  Sunday September 16 2017 19:18:35 EDT Ventricular Rate:  96 PR Interval:    QRS Duration: 71 QT Interval:  333 QTC Calculation: 421 R Axis:   83 Text Interpretation:  Sinus tachycardia Atrial premature complexes Borderline right axis deviation Since last tracing rate faster Confirmed by Dorie Rank [phone]) on 09/16/2017 7:25:23 PM   Radiology Dg Chest 2 View  Result Date: 09/16/2017 CLINICAL DATA:  Palpitations, left lower lobe squamous cell lung carcinoma recently diagnosed EXAM: CHEST - 2 VIEW COMPARISON:  09/11/2017 chest radiograph. FINDINGS: Stable cardiomediastinal silhouette with normal heart size. No pneumothorax. Small left pleural effusion is stable. No right pleural effusion. Hyperinflated lungs and emphysema. Known large medial left lower lobe lung mass is stable. No pulmonary edema. Mild patchy left lung base opacity, similar. IMPRESSION: 1. Known medial left lower lobe lung mass appears stable. 2. Mild patchy left lung base opacity appears stable, compatible with postobstructive pneumonia/atelectasis. 3. Stable small left pleural effusion. 4. Hyperinflated lungs and emphysema, suggesting COPD. Electronically Signed   By: Ilona Sorrel M.D.   On: 09/16/2017 20:01    Procedures Procedures (including  critical care time)  Medications Ordered in ED Medications  sodium chloride 0.9 % bolus 500 mL (0 mLs Intravenous Stopped 09/16/17 2047)    Followed by  0.9 %  sodium chloride infusion (1,000 mLs Intravenous New Bag/Given 09/16/17 2000)  iopamidol (ISOVUE-370) 76 % injection (has no administration in time range)     Initial Impression / Assessment and Plan / ED Course  I have reviewed the triage vital signs and the nursing notes.  Pertinent labs & imaging results that were available during my care of the patient were reviewed by me and considered in my medical decision making (see chart for details).  Clinical Course as of Sep 17 2210  Nancy Fetter Sep 16, 2017  1928 Patient's heart rate at the bedside varied.  At times she had a narrow complex tachycardia up to the 140s but then it quickly returned back into the low 100s   [JK]  2057 Labs notable for an elevated d-dimer.  Will check troponin and basic metabolic panel but plan on doing CT Angio of the chest   [JK]  2152 HR is stable in the 70s and 80s   [JK]  2159 CXR reviewed.  ?pna atelectasis.  CT pending   [JK]    Clinical Course User Index [JK] Dorie Rank, MD    She presented to the emergency room for evaluation of palpitations.  Patient was noted to have sinus tachycardia by EMS.  Initial laboratory test showed elevated white blood cell count and an elevated d-dimer.  Patient does have a known history of lung cancer.  She is certainly at risk for pulmonary embolism.  CT scan of her chest has been ordered.  With hydration the patient's symptoms have improved significantly.  Her heart rates in the 70s and 80s now.  PA Shelly Coss will follow up on the CT scan.  Final Clinical Impressions(s) / ED Diagnoses   Final diagnoses:  Heart palpitations    ED Discharge Orders    None       Dorie Rank, MD 09/16/17 2213

## 2017-09-16 NOTE — ED Provider Notes (Signed)
  Physical Exam  BP (!) 97/52 (BP Location: Left Arm)   Pulse 72   Resp (!) 22   Ht 5' 0.5" (1.537 m)   Wt 33.5 kg   SpO2 99%   BMI 14.19 kg/m   Physical Exam  Constitutional: She appears well-developed and well-nourished. No distress.  Normal WOB noted. Speaking in complete sentences without difficulty.  HENT:  Head: Normocephalic and atraumatic.  Eyes: Conjunctivae and EOM are normal. No scleral icterus.  Neck: Normal range of motion.  Pulmonary/Chest: Effort normal. No respiratory distress.  Neurological: She is alert.  Skin: No rash noted. She is not diaphoretic.  Psychiatric: She has a normal mood and affect.  Nursing note and vitals reviewed.   ED Course/Procedures   Clinical Course as of Sep 17 33  Sun Sep 16, 2017  1928 Patient's heart rate at the bedside varied.  At times she had a narrow complex tachycardia up to the 140s but then it quickly returned back into the low 100s   [JK]  2057 Labs notable for an elevated d-dimer.  Will check troponin and basic metabolic panel but plan on doing CT Angio of the chest   [JK]  2152 HR is stable in the 70s and 80s   [JK]  2159 CXR reviewed.  ?pna atelectasis.  CT pending   [JK]  Mon Sep 17, 2017  0000 CT angios of the chest shows pulmonary mass with small pleural effusion.  On recheck, patient reports improvement in her symptoms.  She does not endorse any infectious symptoms such as cough, fever.  She denies any shortness of breath.  Plan is to ambulate patient with pulse oximetry and reevaluate.   [HK]    Clinical Course User Index [HK] Delia Heady, PA-C [JK] Dorie Rank, MD    Procedures  MDM  Care handed off from previous provider, J.Knapp, MD.  Please see their note for further detail.  Briefly resents to ED for evaluation of palpitations and lightheadedness that began today.  Per EMS, patient's initial heart rate was in the 200s.  Improvement with IV fluids.  She does have a history of COPD and recent diagnosis of  lung cancer.  Scheduled for some type of treatment this week.  Denies any prior PE or cardiac abnormalities.  Patient's heart rate improved to 70s to 80s here in the ED.  Lab work significant for elevated d-dimer.  Chest x-ray shows pneumonia versus atelectasis.  Leukocytosis at 16.  Plan is to obtain CT angios of the chest and reevaluate.  CT Angie of the chest shows pulmonary mass with pleural effusion.  Patient denies any infectious symptoms.  Patient ambulated with pulse oximetry at 93%.  She denies any shortness of breath.  Will advise her to follow-up with her oncologist and made her aware of CT angios findings.  Patient is comfortable with discharge home.  Will advise her to return to ED for any severe worsening symptoms.   Portions of this note were generated with [employer]. Dictation errors may occur despite best attempts at proofreading.     Delia Heady, PA-C 09/17/17 0037    Ward, Delice Bison, DO 09/17/17 0110

## 2017-09-16 NOTE — ED Notes (Signed)
Patient transported to X-ray 

## 2017-09-17 ENCOUNTER — Inpatient Hospital Stay: Payer: PPO

## 2017-09-17 ENCOUNTER — Ambulatory Visit
Admission: RE | Admit: 2017-09-17 | Discharge: 2017-09-17 | Disposition: A | Discharge status: Home / Self Care | Payer: PPO | Source: Ambulatory Visit | Attending: Radiation Oncology | Admitting: Radiation Oncology

## 2017-09-17 VITALS — BP 132/66 | HR 71 | Temp 99.0°F | Resp 20

## 2017-09-17 DIAGNOSIS — Z5111 Encounter for antineoplastic chemotherapy: Secondary | ICD-10-CM | POA: Diagnosis not present

## 2017-09-17 DIAGNOSIS — Z510 Encounter for antineoplastic radiation therapy: Secondary | ICD-10-CM | POA: Diagnosis not present

## 2017-09-17 DIAGNOSIS — C3490 Malignant neoplasm of unspecified part of unspecified bronchus or lung: Secondary | ICD-10-CM

## 2017-09-17 DIAGNOSIS — C3432 Malignant neoplasm of lower lobe, left bronchus or lung: Secondary | ICD-10-CM | POA: Diagnosis not present

## 2017-09-17 LAB — CBC WITH DIFFERENTIAL (CANCER CENTER ONLY)
Basophils Absolute: 0.1 10*3/uL (ref 0.0–0.1)
Basophils Relative: 1 %
Eosinophils Absolute: 0.1 10*3/uL (ref 0.0–0.5)
Eosinophils Relative: 1 %
HEMATOCRIT: 31.3 % — AB (ref 34.8–46.6)
HEMOGLOBIN: 9.9 g/dL — AB (ref 11.6–15.9)
LYMPHS ABS: 1.8 10*3/uL (ref 0.9–3.3)
Lymphocytes Relative: 19 %
MCH: 25.9 pg (ref 25.1–34.0)
MCHC: 31.5 g/dL (ref 31.5–36.0)
MCV: 82 fL (ref 79.5–101.0)
MONOS PCT: 10 %
Monocytes Absolute: 0.9 10*3/uL (ref 0.1–0.9)
NEUTROS ABS: 6.7 10*3/uL — AB (ref 1.5–6.5)
NEUTROS PCT: 69 %
Platelet Count: 431 10*3/uL — ABNORMAL HIGH (ref 145–400)
RBC: 3.81 MIL/uL (ref 3.70–5.45)
RDW: 17.5 % — ABNORMAL HIGH (ref 11.2–14.5)
WBC Count: 9.5 10*3/uL (ref 3.9–10.3)

## 2017-09-17 LAB — CMP (CANCER CENTER ONLY)
ALBUMIN: 2.4 g/dL — AB (ref 3.5–5.0)
ALK PHOS: 85 U/L (ref 38–126)
ALT: 6 U/L (ref 0–44)
AST: 12 U/L — AB (ref 15–41)
Anion gap: 8 (ref 5–15)
BILIRUBIN TOTAL: 0.2 mg/dL — AB (ref 0.3–1.2)
BUN: 10 mg/dL (ref 8–23)
CO2: 27 mmol/L (ref 22–32)
Calcium: 9.1 mg/dL (ref 8.9–10.3)
Chloride: 107 mmol/L (ref 98–111)
Creatinine: 0.55 mg/dL (ref 0.44–1.00)
GFR, Est AFR Am: >60 mL/min (ref >60)
GFR, Estimated: >60 mL/min (ref >60)
GLUCOSE: 75 mg/dL (ref 70–99)
Potassium: 3.7 mmol/L (ref 3.5–5.1)
SODIUM: 142 mmol/L (ref 135–145)
Total Protein: 5.9 g/dL — ABNORMAL LOW (ref 6.5–8.1)

## 2017-09-17 MED ORDER — SODIUM CHLORIDE 0.9 % IV SOLN
20.0000 mg | Freq: Once | INTRAVENOUS | Status: AC
  Administered 2017-09-17: 20 mg via INTRAVENOUS
  Filled 2017-09-17: qty 2

## 2017-09-17 MED ORDER — PALONOSETRON HCL INJECTION 0.25 MG/5ML
INTRAVENOUS | Status: AC
  Filled 2017-09-17: qty 5

## 2017-09-17 MED ORDER — PALONOSETRON HCL INJECTION 0.25 MG/5ML
0.2500 mg | Freq: Once | INTRAVENOUS | Status: AC
  Administered 2017-09-17: 0.25 mg via INTRAVENOUS

## 2017-09-17 MED ORDER — SODIUM CHLORIDE 0.9 % IV SOLN
102.2000 mg | Freq: Once | INTRAVENOUS | Status: AC
  Administered 2017-09-17: 100 mg via INTRAVENOUS
  Filled 2017-09-17: qty 10

## 2017-09-17 MED ORDER — SODIUM CHLORIDE 0.9 % IV SOLN
45.0000 mg/m2 | Freq: Once | INTRAVENOUS | Status: AC
  Administered 2017-09-17: 54 mg via INTRAVENOUS
  Filled 2017-09-17: qty 9

## 2017-09-17 MED ORDER — SODIUM CHLORIDE 0.9 % IV SOLN
Freq: Once | INTRAVENOUS | Status: AC
  Administered 2017-09-17: 09:00:00 via INTRAVENOUS
  Filled 2017-09-17: qty 250

## 2017-09-17 MED ORDER — DIPHENHYDRAMINE HCL 50 MG/ML IJ SOLN
50.0000 mg | Freq: Once | INTRAMUSCULAR | Status: AC
  Administered 2017-09-17: 50 mg via INTRAVENOUS

## 2017-09-17 MED ORDER — FAMOTIDINE IN NACL 20-0.9 MG/50ML-% IV SOLN
20.0000 mg | Freq: Once | INTRAVENOUS | Status: AC
  Administered 2017-09-17: 20 mg via INTRAVENOUS

## 2017-09-17 MED ORDER — DIPHENHYDRAMINE HCL 50 MG/ML IJ SOLN
INTRAMUSCULAR | Status: AC
  Filled 2017-09-17: qty 1

## 2017-09-17 MED ORDER — FAMOTIDINE IN NACL 20-0.9 MG/50ML-% IV SOLN
INTRAVENOUS | Status: AC
  Filled 2017-09-17: qty 50

## 2017-09-17 NOTE — ED Notes (Signed)
Ambulated patient around nurses station with no complaints from patient. O2% 93, heart rate between 85-96

## 2017-09-17 NOTE — Patient Instructions (Signed)
[facility] Discharge Instructions for Patients Receiving Chemotherapy  Today you received the following chemotherapy agents Taxol, Carboplatin.  To help prevent nausea and vomiting after your treatment, we encourage you to take your nausea medication as prescribed.   If you develop nausea and vomiting that is not controlled by your nausea medication, call the clinic.   BELOW ARE SYMPTOMS THAT SHOULD BE REPORTED IMMEDIATELY:  *FEVER GREATER THAN 100.5 F  *CHILLS WITH OR WITHOUT FEVER  NAUSEA AND VOMITING THAT IS NOT CONTROLLED WITH YOUR NAUSEA MEDICATION  *UNUSUAL SHORTNESS OF BREATH  *UNUSUAL BRUISING OR BLEEDING  TENDERNESS IN MOUTH AND THROAT WITH OR WITHOUT PRESENCE OF ULCERS  *URINARY PROBLEMS  *BOWEL PROBLEMS  UNUSUAL RASH Items with * indicate a potential emergency and should be followed up as soon as possible.  Feel free to call the clinic should you have any questions or concerns. The clinic phone number is [phone].  Please show the [facility] at check-in to the Emergency Department and triage nurse.    Paclitaxel injection (Taxol) What is this medicine? PACLITAXEL (PAK li TAX el) is a chemotherapy drug. It targets fast dividing cells, like cancer cells, and causes these cells to die. This medicine is used to treat ovarian cancer, breast cancer, and other cancers. This medicine may be used for other purposes; ask your health care provider or pharmacist if you have questions. COMMON BRAND NAME(S): Onxol, Taxol What should I tell my health care provider before I take this medicine? They need to know if you have any of these conditions: -blood disorders -irregular heartbeat -infection (especially a virus infection such as chickenpox, cold sores, or herpes) -liver disease -previous or ongoing radiation therapy -an unusual or allergic reaction to paclitaxel, alcohol, polyoxyethylated castor oil, other chemotherapy agents, other medicines,  foods, dyes, or preservatives -pregnant or trying to get pregnant -breast-feeding How should I use this medicine? This drug is given as an infusion into a vein. It is administered in a hospital or clinic by a specially trained health care professional. Talk to your pediatrician regarding the use of this medicine in children. Special care may be needed. Overdosage: If you think you have taken too much of this medicine contact a poison control center or emergency room at once. NOTE: This medicine is only for you. Do not share this medicine with others. What if I miss a dose? It is important not to miss your dose. Call your doctor or health care professional if you are unable to keep an appointment. What may interact with this medicine? Do not take this medicine with any of the following medications: -disulfiram -metronidazole This medicine may also interact with the following medications: -cyclosporine -diazepam -ketoconazole -medicines to increase blood counts like filgrastim, pegfilgrastim, sargramostim -other chemotherapy drugs like cisplatin, doxorubicin, epirubicin, etoposide, teniposide, vincristine -quinidine -testosterone -vaccines -verapamil Talk to your doctor or health care professional before taking any of these medicines: -acetaminophen -aspirin -ibuprofen -ketoprofen -naproxen This list may not describe all possible interactions. Give your health care provider a list of all the medicines, herbs, non-prescription drugs, or dietary supplements you use. Also tell them if you smoke, drink alcohol, or use illegal drugs. Some items may interact with your medicine. What should I watch for while using this medicine? Your condition will be monitored carefully while you are receiving this medicine. You will need important blood work done while you are taking this medicine. This medicine can cause serious allergic reactions. To reduce your risk you  will need to take other  medicine(s) before treatment with this medicine. If you experience allergic reactions like skin rash, itching or hives, swelling of the face, lips, or tongue, tell your doctor or health care professional right away. In some cases, you may be given additional medicines to help with side effects. Follow all directions for their use. This drug may make you feel generally unwell. This is not uncommon, as chemotherapy can affect healthy cells as well as cancer cells. Report any side effects. Continue your course of treatment even though you feel ill unless your doctor tells you to stop. Call your doctor or health care professional for advice if you get a fever, chills or sore throat, or other symptoms of a cold or flu. Do not treat yourself. This drug decreases your body's ability to fight infections. Try to avoid being around people who are sick. This medicine may increase your risk to bruise or bleed. Call your doctor or health care professional if you notice any unusual bleeding. Be careful brushing and flossing your teeth or using a toothpick because you may get an infection or bleed more easily. If you have any dental work done, tell your dentist you are receiving this medicine. Avoid taking products that contain aspirin, acetaminophen, ibuprofen, naproxen, or ketoprofen unless instructed by your doctor. These medicines may hide a fever. Do not become pregnant while taking this medicine. Women should inform their doctor if they wish to become pregnant or think they might be pregnant. There is a potential for serious side effects to an unborn child. Talk to your health care professional or pharmacist for more information. Do not breast-feed an infant while taking this medicine. Men are advised not to father a child while receiving this medicine. This product may contain alcohol. Ask your pharmacist or healthcare provider if this medicine contains alcohol. Be sure to tell all healthcare providers you are  taking this medicine. Certain medicines, like metronidazole and disulfiram, can cause an unpleasant reaction when taken with alcohol. The reaction includes flushing, headache, nausea, vomiting, sweating, and increased thirst. The reaction can last from 30 minutes to several hours. What side effects may I notice from receiving this medicine? Side effects that you should report to your doctor or health care professional as soon as possible: -allergic reactions like skin rash, itching or hives, swelling of the face, lips, or tongue -low blood counts - This drug may decrease the number of white blood cells, red blood cells and platelets. You may be at increased risk for infections and bleeding. -signs of infection - fever or chills, cough, sore throat, pain or difficulty passing urine -signs of decreased platelets or bleeding - bruising, pinpoint red spots on the skin, black, tarry stools, nosebleeds -signs of decreased red blood cells - unusually weak or tired, fainting spells, lightheadedness -breathing problems -chest pain -high or low blood pressure -mouth sores -nausea and vomiting -pain, swelling, redness or irritation at the injection site -pain, tingling, numbness in the hands or feet -slow or irregular heartbeat -swelling of the ankle, feet, hands Side effects that usually do not require medical attention (report to your doctor or health care professional if they continue or are bothersome): -bone pain -complete hair loss including hair on your head, underarms, pubic hair, eyebrows, and eyelashes -changes in the color of fingernails -diarrhea -loosening of the fingernails -loss of appetite -muscle or joint pain -red flush to skin -sweating This list may not describe all possible side effects. Call your doctor for  medical advice about side effects. You may report side effects to FDA at 1-800-FDA-1088. Where should I keep my medicine? This drug is given in a hospital or clinic and  will not be stored at home. NOTE: This sheet is a summary. It may not cover all possible information. If you have questions about this medicine, talk to your doctor, pharmacist, or health care provider.  2018 [employer]/Gold Standard (2014-10-27 19:58:00)   Carboplatin injection What is this medicine? CARBOPLATIN (KAR boe pla tin) is a chemotherapy drug. It targets fast dividing cells, like cancer cells, and causes these cells to die. This medicine is used to treat ovarian cancer and many other cancers. This medicine may be used for other purposes; ask your health care provider or pharmacist if you have questions. COMMON BRAND NAME(S): Paraplatin What should I tell my health care provider before I take this medicine? They need to know if you have any of these conditions: -blood disorders -hearing problems -kidney disease -recent or ongoing radiation therapy -an unusual or allergic reaction to carboplatin, cisplatin, other chemotherapy, other medicines, foods, dyes, or preservatives -pregnant or trying to get pregnant -breast-feeding How should I use this medicine? This drug is usually given as an infusion into a vein. It is administered in a hospital or clinic by a specially trained health care professional. Talk to your pediatrician regarding the use of this medicine in children. Special care may be needed. Overdosage: If you think you have taken too much of this medicine contact a poison control center or emergency room at once. NOTE: This medicine is only for you. Do not share this medicine with others. What if I miss a dose? It is important not to miss a dose. Call your doctor or health care professional if you are unable to keep an appointment. What may interact with this medicine? -medicines for seizures -medicines to increase blood counts like filgrastim, pegfilgrastim, sargramostim -some antibiotics like amikacin, gentamicin, neomycin, streptomycin, tobramycin -vaccines Talk to  your doctor or health care professional before taking any of these medicines: -acetaminophen -aspirin -ibuprofen -ketoprofen -naproxen This list may not describe all possible interactions. Give your health care provider a list of all the medicines, herbs, non-prescription drugs, or dietary supplements you use. Also tell them if you smoke, drink alcohol, or use illegal drugs. Some items may interact with your medicine. What should I watch for while using this medicine? Your condition will be monitored carefully while you are receiving this medicine. You will need important blood work done while you are taking this medicine. This drug may make you feel generally unwell. This is not uncommon, as chemotherapy can affect healthy cells as well as cancer cells. Report any side effects. Continue your course of treatment even though you feel ill unless your doctor tells you to stop. In some cases, you may be given additional medicines to help with side effects. Follow all directions for their use. Call your doctor or health care professional for advice if you get a fever, chills or sore throat, or other symptoms of a cold or flu. Do not treat yourself. This drug decreases your body's ability to fight infections. Try to avoid being around people who are sick. This medicine may increase your risk to bruise or bleed. Call your doctor or health care professional if you notice any unusual bleeding. Be careful brushing and flossing your teeth or using a toothpick because you may get an infection or bleed more easily. If you have any dental work done,  tell your dentist you are receiving this medicine. Avoid taking products that contain aspirin, acetaminophen, ibuprofen, naproxen, or ketoprofen unless instructed by your doctor. These medicines may hide a fever. Do not become pregnant while taking this medicine. Women should inform their doctor if they wish to become pregnant or think they might be pregnant. There is a  potential for serious side effects to an unborn child. Talk to your health care professional or pharmacist for more information. Do not breast-feed an infant while taking this medicine. What side effects may I notice from receiving this medicine? Side effects that you should report to your doctor or health care professional as soon as possible: -allergic reactions like skin rash, itching or hives, swelling of the face, lips, or tongue -signs of infection - fever or chills, cough, sore throat, pain or difficulty passing urine -signs of decreased platelets or bleeding - bruising, pinpoint red spots on the skin, black, tarry stools, nosebleeds -signs of decreased red blood cells - unusually weak or tired, fainting spells, lightheadedness -breathing problems -changes in hearing -changes in vision -chest pain -high blood pressure -low blood counts - This drug may decrease the number of white blood cells, red blood cells and platelets. You may be at increased risk for infections and bleeding. -nausea and vomiting -pain, swelling, redness or irritation at the injection site -pain, tingling, numbness in the hands or feet -problems with balance, talking, walking -trouble passing urine or change in the amount of urine Side effects that usually do not require medical attention (report to your doctor or health care professional if they continue or are bothersome): -hair loss -loss of appetite -metallic taste in the mouth or changes in taste This list may not describe all possible side effects. Call your doctor for medical advice about side effects. You may report side effects to FDA at 1-800-FDA-1088. Where should I keep my medicine? This drug is given in a hospital or clinic and will not be stored at home. NOTE: This sheet is a summary. It may not cover all possible information. If you have questions about this medicine, talk to your doctor, pharmacist, or health care provider.  2018 [employer]/Gold  Standard (2007-04-02 14:38:05)

## 2017-09-17 NOTE — Discharge Instructions (Addendum)
Return to ED for worsening symptoms, trouble breathing, chest pain, coughing or vomiting up blood, injuries or falls.

## 2017-09-18 ENCOUNTER — Encounter: Payer: Self-pay | Admitting: *Deleted

## 2017-09-18 ENCOUNTER — Telehealth: Payer: Self-pay | Admitting: Urology

## 2017-09-18 ENCOUNTER — Ambulatory Visit
Admission: RE | Admit: 2017-09-18 | Discharge: 2017-09-18 | Disposition: A | Discharge status: Home / Self Care | Payer: PPO | Source: Ambulatory Visit | Attending: Radiation Oncology | Admitting: Radiation Oncology

## 2017-09-18 DIAGNOSIS — Z510 Encounter for antineoplastic radiation therapy: Secondary | ICD-10-CM | POA: Diagnosis not present

## 2017-09-18 DIAGNOSIS — C3432 Malignant neoplasm of lower lobe, left bronchus or lung: Secondary | ICD-10-CM | POA: Diagnosis not present

## 2017-09-18 MED ORDER — HYDROCODONE-HOMATROPINE 5-1.5 MG/5ML PO SYRP: 5.0000 mL | ORAL_SOLUTION | Freq: Every evening | ORAL | 0 refills | Status: DC | PRN

## 2017-09-18 NOTE — Telephone Encounter (Signed)
Patient informed that a Rx for Hycodan cough syrup to use at night will be sent to her pharmacy and we will continue to follow this issue at subsequent vistis throughout her remainder of treatment. Rx sent.   Nicholos Johns, PA-C

## 2017-09-18 NOTE — Telephone Encounter (Signed)
-----   Message from Malena Edman, RN sent at 09/18/2017 11:29 AM EDT ----- Regarding: Cough medication Hycodan Contact: [phone] Returned call to Mrs. Goodell about medication to take for headache and discussion about being hoarse.  She does not have a sore throat or any swallowing issues while eating or drinking liquids.  She is coughing clear secretions at times she has been taking Tussin DM and would like to have a cough medication called into her pharmacy.

## 2017-09-19 ENCOUNTER — Encounter: Payer: Self-pay | Admitting: *Deleted

## 2017-09-19 ENCOUNTER — Ambulatory Visit
Admission: RE | Admit: 2017-09-19 | Discharge: 2017-09-19 | Disposition: A | Discharge status: Home / Self Care | Payer: PPO | Source: Ambulatory Visit | Attending: Radiation Oncology | Admitting: Radiation Oncology

## 2017-09-19 ENCOUNTER — Ambulatory Visit: Payer: PPO

## 2017-09-19 DIAGNOSIS — C3432 Malignant neoplasm of lower lobe, left bronchus or lung: Secondary | ICD-10-CM | POA: Diagnosis not present

## 2017-09-19 DIAGNOSIS — Z510 Encounter for antineoplastic radiation therapy: Secondary | ICD-10-CM | POA: Diagnosis not present

## 2017-09-20 ENCOUNTER — Telehealth: Payer: Self-pay | Admitting: *Deleted

## 2017-09-20 ENCOUNTER — Inpatient Hospital Stay (HOSPITAL_BASED_OUTPATIENT_CLINIC_OR_DEPARTMENT_OTHER): Payer: PPO | Admitting: Nurse Practitioner

## 2017-09-20 ENCOUNTER — Inpatient Hospital Stay: Payer: PPO

## 2017-09-20 ENCOUNTER — Telehealth: Payer: Self-pay | Admitting: Hematology

## 2017-09-20 ENCOUNTER — Ambulatory Visit
Admission: RE | Admit: 2017-09-20 | Discharge: 2017-09-20 | Disposition: A | Discharge status: Home / Self Care | Payer: PPO | Source: Ambulatory Visit | Attending: Radiation Oncology | Admitting: Radiation Oncology

## 2017-09-20 DIAGNOSIS — Z604 Social exclusion and rejection: Secondary | ICD-10-CM

## 2017-09-20 DIAGNOSIS — R600 Localized edema: Secondary | ICD-10-CM

## 2017-09-20 DIAGNOSIS — Z5111 Encounter for antineoplastic chemotherapy: Secondary | ICD-10-CM | POA: Diagnosis not present

## 2017-09-20 DIAGNOSIS — M79602 Pain in left arm: Secondary | ICD-10-CM

## 2017-09-20 DIAGNOSIS — C3432 Malignant neoplasm of lower lobe, left bronchus or lung: Secondary | ICD-10-CM

## 2017-09-20 DIAGNOSIS — Z510 Encounter for antineoplastic radiation therapy: Secondary | ICD-10-CM | POA: Diagnosis not present

## 2017-09-20 DIAGNOSIS — E440 Moderate protein-calorie malnutrition: Secondary | ICD-10-CM

## 2017-09-20 LAB — CBC WITH DIFFERENTIAL (CANCER CENTER ONLY)
BASOS ABS: 0 10*3/uL (ref 0.0–0.1)
BASOS PCT: 0 %
EOS ABS: 0.1 10*3/uL (ref 0.0–0.5)
EOS PCT: 1 %
HCT: 33.8 % — ABNORMAL LOW (ref 34.8–46.6)
HEMOGLOBIN: 10.1 g/dL — AB (ref 11.6–15.9)
LYMPHS ABS: 0.9 10*3/uL (ref 0.9–3.3)
Lymphocytes Relative: 9 %
MCH: 25.6 pg (ref 25.1–34.0)
MCHC: 29.9 g/dL — ABNORMAL LOW (ref 31.5–36.0)
MCV: 85.8 fL (ref 79.5–101.0)
Monocytes Absolute: 0.5 10*3/uL (ref 0.1–0.9)
Monocytes Relative: 5 %
NEUTROS PCT: 85 %
Neutro Abs: 8.4 10*3/uL — ABNORMAL HIGH (ref 1.5–6.5)
Platelet Count: 419 10*3/uL — ABNORMAL HIGH (ref 145–400)
RBC: 3.94 MIL/uL (ref 3.70–5.45)
RDW: 17.4 % — ABNORMAL HIGH (ref 11.2–14.5)
WBC: 10 10*3/uL (ref 3.9–10.3)

## 2017-09-20 LAB — CMP (CANCER CENTER ONLY)
ALK PHOS: 96 U/L (ref 38–126)
ALT: 8 U/L (ref 0–44)
AST: 12 U/L — ABNORMAL LOW (ref 15–41)
Albumin: 2.5 g/dL — ABNORMAL LOW (ref 3.5–5.0)
Anion gap: 5 (ref 5–15)
BUN: 9 mg/dL (ref 8–23)
CALCIUM: 9.5 mg/dL (ref 8.9–10.3)
CO2: 33 mmol/L — AB (ref 22–32)
Chloride: 103 mmol/L (ref 98–111)
Creatinine: 0.6 mg/dL (ref 0.44–1.00)
GFR, Estimated: >60 mL/min (ref >60)
GLUCOSE: 100 mg/dL — AB (ref 70–99)
Potassium: 3.9 mmol/L (ref 3.5–5.1)
SODIUM: 141 mmol/L (ref 135–145)
Total Bilirubin: 0.4 mg/dL (ref 0.3–1.2)
Total Protein: 6.3 g/dL — ABNORMAL LOW (ref 6.5–8.1)

## 2017-09-20 NOTE — Progress Notes (Signed)
[facility] Progress Note   TENEA SENS [id number] 1945-10-28 72 y.o.  Sherry [name] is managed by Dr. Eilleen Kempf  Actively treated with chemotherapy/immunotherapy: yes  Current Therapy: Carbo/Taxol   Last Treated: 09/17/2017  Assessment: Plan:    Social isolation  Primary malignant neoplasm of bronchus of left lower lobe ([place])  Moderate protein-calorie malnutrition ([place])  Edema of both feet She was strongly encouraged to increase her caloric intake and particularly her protein intake. Reviewed with her specific ways to improve protein. She had called in with concern over arm swelling.  Her concern about her arm is completely unwarranted. There is no swelling, no bruising, no discoloration, and no palpable abnormality. She does report some mild tenderness.  She has mild swelling in her feet, and is newly off of steroids. Currently she is noncompliant with neurontin. We will see her back Monday prior to chemo. Encouraged to elevate feet, and increase protein in her diet. I am most hesitant to give her a diuretic because her systolic blood pressure is 115 and she lives alone.    Her albumin is low and she has not had sufficient dietary protein intake.  Please see After Visit Summary for patient specific instructions.  Future Appointments  Date Time Provider Candor  09/21/2017  3:05 PM [facility] [id number] [facility] None  09/24/2017 10:00 AM [facility] [id number] [facility] None  09/24/2017 10:30 AM [facility] ONLY [facility] None  09/24/2017 11:00 AM Alla Feeling, NP [facility] None  09/24/2017 12:00 PM [facility] INFUSION [facility] None  09/24/2017  2:30 PM Neff, Barbara L, RD [facility] None  09/25/2017  3:00 PM [facility] [id number] [facility] None  09/26/2017  3:00 PM [facility] [id number] [facility] None  09/27/2017  3:00 PM [facility] [id number] [facility] None  09/28/2017  3:00 PM [facility] [id number] [facility] None    10/01/2017  7:45 AM [facility] LAB 5 [facility] None  10/01/2017  8:15 AM [facility] INFUSION [facility] None  10/01/2017  3:00 PM [facility] [id number] [facility] None  10/02/2017  3:00 PM [facility] [id number] [facility] None  10/03/2017  3:00 PM [facility] [id number] [facility] None  10/04/2017  3:00 PM [facility] [id number] [facility] None  10/05/2017  3:00 PM [facility] [id number] [facility] None  10/08/2017  3:00 PM [facility] [id number] [facility] None  10/09/2017  3:00 PM [facility] [id number] [facility] None  10/10/2017  3:00 PM [facility] [id number] [facility] None  10/11/2017  3:00 PM [facility] [id number] [facility] None  10/12/2017  3:00 PM [facility] [id number] [facility] None  10/15/2017  3:00 PM [facility] [id number] [facility] None  10/16/2017  3:00 PM [facility] [id number] [facility] None  10/17/2017  3:00 PM [facility] [id number] [facility] None  10/18/2017  3:00 PM [facility] [id number] [facility] None  10/19/2017  3:00 PM [facility] [id number] [facility] None  10/22/2017  3:00 PM [facility] [id number] [facility] None  10/23/2017  3:00 PM [facility] [id number] [facility] None  10/24/2017  3:00 PM [facility] [id number] [facility] None  10/25/2017  3:00 PM [facility] [id number] [facility] None  10/26/2017  3:00 PM [facility] [id number] [facility] None  10/29/2017  3:00 PM [facility] [id number] [facility] None  10/30/2017  3:00 PM [facility] [id number] [facility] None  10/31/2017  3:00 PM [facility] [id number] [facility] None    No orders of the defined types were placed in this encounter.      Subjective:   Patient ID:  Sherry [name] is a 72 y.o. (DOB 07-28-1945) female.  Chief Complaint:  Chief Complaint  Patient presents with  . Bruise/Possible Clot    HPI Sherry [name] presents to the  office today with a compliant of pain in left arm and concern about blood clot. She also is worried about swelling in her feet.   Medications: I have  reviewed the patient's current medications.  Allergies:  Allergies  Allergen Reactions  . Penicillins Hives    Has patient had a PCN reaction causing immediate rash, facial/tongue/throat swelling, SOB or lightheadedness with hypotension: no Has patient had a PCN reaction causing severe rash involving mucus membranes or skin necrosis: No Has patient had a PCN reaction that required hospitalization: No Has patient had a PCN reaction occurring within the last 10 years: No If all of the above answers are "NO", then may proceed with Cephalosporin use.     Past Medical History:  Diagnosis Date  . Allergy   . Anxiety   . Arthritis   . Cavitating mass in left lower lung lobe   . Complication of anesthesia    woke up during procedure  . COPD (chronic obstructive pulmonary disease) ([place])   . Depression   . Family history of adverse reaction to anesthesia    " my mother was always hard to wake up"  . Full dentures   . Headache   . Neuromuscular disorder ([place])   . Pneumonia   . Wears glasses     Past Surgical History:  Procedure Laterality Date  . ELBOW SURGERY     for  "Tennis Elbow"  . hand surgery    . MULTIPLE TOOTH EXTRACTIONS    . TUBAL LIGATION    . VIDEO BRONCHOSCOPY N/A 08/31/2017   Procedure: VIDEO BRONCHOSCOPY;  Surgeon: Melrose Nakayama, MD;  Location: [facility];  Service: Thoracic;  Laterality: N/A;    Family History  Problem Relation Age of Onset  . Diabetes Mother     Social History   Socioeconomic History  . Marital status: Divorced    Spouse name: Not on file  . Number of children: Not on file  . Years of education: Not on file  . Highest education level: Not on file  Occupational History  . Not on file  Social Needs  . Financial resource strain: Not on file  . Food insecurity:    Worry: Not on file    Inability: Not on file  . Transportation needs:    Medical: Not on file    Non-medical: Not on file  Tobacco Use  . Smoking status: Former  Smoker    Packs/day: 1.00    Years: 50.00    Pack years: 50.00    Types: Cigarettes    Last attempt to quit: 08/24/2017    Years since quitting: 0.0  . Smokeless tobacco: Never Used  Substance and Sexual Activity  . Alcohol use: No  . Drug use: No  . Sexual activity: Never  Lifestyle  . Physical activity:    Days per week: Not on file    Minutes per session: Not on file  . Stress: Not on file  Relationships  . Social connections:    Talks on phone: Not on file    Gets together: Not on file    Attends religious service: Not on file    Active member of club or organization: Not on file    Attends meetings of clubs or organizations: Not on file    Relationship status: Not on file  . Intimate partner violence:    Fear of current or ex partner: Not on file    Emotionally abused: Not on file  Physically abused: Not on file    Forced sexual activity: Not on file  Other Topics Concern  . Not on file  Social History Narrative  . Not on file    Past Medical History, Surgical history, Social history, and Family history were reviewed and updated as appropriate.   Please see review of systems for further details on the patient's review from today.   Review of Systems:  Review of Systems  Cardiovascular:       Bilateral feet swelling  Neurological:       Numbness in feet  All other systems reviewed and are negative.   Objective:   Physical Exam:  There were no vitals taken for this visit. ECOG: 2  Physical Exam  Constitutional:  cachetic  Eyes: EOM are normal.  Neck: Normal range of motion.  Cardiovascular: Regular rhythm and normal heart sounds.  Pulmonary/Chest: Effort normal and breath sounds normal.  Abdominal: Soft.  Skin: Skin is warm and dry.  Poor skin turgor with cracked skin to ankles and calves noted  +2 pitting edema noted bilateral to feet.   Lab Review:     Component Value Date/Time   NA 142 09/17/2017 0740   K 3.7 09/17/2017 0740   CL 107  09/17/2017 0740   CO2 27 09/17/2017 0740   GLUCOSE 75 09/17/2017 0740   BUN 10 09/17/2017 0740   CREATININE 0.55 09/17/2017 0740   CALCIUM 9.1 09/17/2017 0740   PROT 5.9 (L) 09/17/2017 0740   ALBUMIN 2.4 (L) 09/17/2017 0740   AST 12 (L) 09/17/2017 0740   ALT 6 09/17/2017 0740   ALKPHOS 85 09/17/2017 0740   BILITOT 0.2 (L) 09/17/2017 0740   GFRNONAA >60 09/17/2017 0740   GFRAA >60 09/17/2017 0740       Component Value Date/Time   WBC 10.0 09/20/2017 1612   WBC 15.5 (H) 09/16/2017 2003   RBC 3.94 09/20/2017 1612   HGB 10.1 (L) 09/20/2017 1612   HCT 33.8 (L) 09/20/2017 1612   PLT 419 (H) 09/20/2017 1612   MCV 85.8 09/20/2017 1612   MCV 91.9 04/23/2012 1938   MCH 25.6 09/20/2017 1612   MCHC 29.9 (L) 09/20/2017 1612   RDW 17.4 (H) 09/20/2017 1612   LYMPHSABS 0.9 09/20/2017 1612   MONOABS 0.5 09/20/2017 1612   EOSABS 0.1 09/20/2017 1612   BASOSABS 0.0 09/20/2017 1612   -------------------------------  Imaging from last 24 hours (if applicable):  Radiology interpretation: Dg Chest 2 View  Result Date: 09/16/2017 CLINICAL DATA:  Palpitations, left lower lobe squamous cell lung carcinoma recently diagnosed EXAM: CHEST - 2 VIEW COMPARISON:  09/11/2017 chest radiograph. FINDINGS: Stable cardiomediastinal silhouette with normal heart size. No pneumothorax. Small left pleural effusion is stable. No right pleural effusion. Hyperinflated lungs and emphysema. Known large medial left lower lobe lung mass is stable. No pulmonary edema. Mild patchy left lung base opacity, similar. IMPRESSION: 1. Known medial left lower lobe lung mass appears stable. 2. Mild patchy left lung base opacity appears stable, compatible with postobstructive pneumonia/atelectasis. 3. Stable small left pleural effusion. 4. Hyperinflated lungs and emphysema, suggesting COPD. Electronically Signed   By: Ilona Sorrel M.D.   On: 09/16/2017 20:01   Dg Chest 2 View  Result Date: 09/11/2017 CLINICAL DATA:  Mediastinal mass.  Productive cough. History of COPD. Former smoker. EXAM: CHEST - 2 VIEW COMPARISON:  09/01/2017; 08/31/2017; 06/08/2017; chest CT - 07/13/2017; PET-CT - 08/03/2017 FINDINGS: Grossly unchanged cardiac silhouette and mediastinal contours with partial obscuration of left heart border  secondary to known consolidative opacity with the superior segment of the left lower lobe. Grossly unchanged small left and trace right-sided pleural effusions. Interval reduction in persistent tiny right apical pneumothorax. No definite left-sided pneumothorax. No evidence of edema. Improved aeration of left lung base with persistent left basilar opacities, likely atelectasis. The lungs remain hyperexpanded with mild diffuse slightly nodular thickening of the pulmonary interstitium. No new focal airspace opacities. No acute osseus abnormalities. IMPRESSION: 1. Interval reduction in persistent tiny right apical pneumothorax. 2. Improved aeration of lung bases with persistent small left and trace right-sided effusions with associated bibasilar opacities, likely atelectasis. 3. Similar appearance of known left lower lobe pulmonary mass. Electronically Signed   By: Sandi Mariscal M.D.   On: 09/11/2017 08:59   Dg Chest 2 View  Result Date: 09/01/2017 CLINICAL DATA:  Pneumothorax EXAM: CHEST - 2 VIEW COMPARISON:  Chest radiograph from one day prior. FINDINGS: Stable cardiomediastinal silhouette with normal heart size. Small right apical pneumothorax is not appreciably changed. No left pneumothorax. Small bilateral pleural effusions, left greater than right, slightly increased bilaterally. Hyperinflated lungs. No overt pulmonary edema. Hazy bibasilar lung opacities, stable. IMPRESSION: 1. Stable small right apical pneumothorax. 2. Small bilateral pleural effusions, slightly increased bilaterally. 3. Hyperinflated lungs, suggesting COPD. 4. Hazy bibasilar lung opacities, stable, favor atelectasis, cannot exclude a component of pneumonia on the  left. Electronically Signed   By: Ilona Sorrel M.D.   On: 09/01/2017 09:01   Dg Chest 2 View  Result Date: 08/31/2017 CLINICAL DATA:  Lung cancer.  Pre operative exam. EXAM: CHEST - 2 VIEW COMPARISON:  Chest x-ray dated 06/08/2017 and PET-CT dated 08/03/2017 FINDINGS: There is a new 10% right pneumothorax. There is an 8 cm mass in the left lower lobe, increased in size since the prior study. The nodule in the right lower lobe seen on the prior exams is not well seen on this exam. There is a calcification in the right breast overlying the right lung base. Heart size and pulmonary vascularity are normal. Lungs are hyperinflated consistent with emphysema. New pleural thickening at the left lung base laterally. No acute bone abnormality. IMPRESSION: 1. New right apical 10% pneumothorax. 2. Enlarging lung cancer in the left lower lobe. New pleural thickening or loculated effusion at the left lung base laterally. 3.  Emphysema (ICD10-J43.9). 4. Critical Value/emergent results were called by telephone at the time of interpretation on 08/31/2017 at 12:56 pm to Alvy Bimler, RN , who verbally acknowledged these results. Electronically Signed   By: Lorriane Shire M.D.   On: 08/31/2017 12:51   Ct Angio Chest Pe W And/or Wo Contrast  Result Date: 09/16/2017 CLINICAL DATA:  Chest pain and shortness of breath. Recent diagnosis of lung cancer. EXAM: CT ANGIOGRAPHY CHEST WITH CONTRAST TECHNIQUE: Multidetector CT imaging of the chest was performed using the standard protocol during bolus administration of intravenous contrast. Multiplanar CT image reconstructions and MIPs were obtained to evaluate the vascular anatomy. CONTRAST:  82mL ISOVUE-370 IOPAMIDOL (ISOVUE-370) INJECTION 76% COMPARISON:  Chest CT 07/13/2017, PET-CT 08/03/2017 FINDINGS: Cardiovascular: There are no filling defects within the pulmonary arteries to suggest pulmonary embolus. None central left lower lobe lung mass partially encases segmental left lower lobe  pulmonary artery with associated narrowing, no evidence of invasion or filling defect. Aortic tortuosity without dissection. Mild aortic atherosclerosis. No significant pericardial effusion. Mediastinum/Nodes: Again seen 12 mm left hilar lymph nodes. Small upper paratracheal and superior mediastinal nodes not enlarged by size criteria. A right hilar node measures  9 mm, unchanged from prior CT and not hypermetabolic on PET. The esophagus is decompressed. Visualized thyroid gland is normal. Lungs/Pleura: Heterogeneous left lower lobe pulmonary mass appears grossly unchanged in size allowing for differences in caliper placement measuring approximately 5.9 x 4.9 x 7 cm. Progressive patchy opacities and nodular in the left lower lobe. Left pleural effusion which is minimally loculated has progressed from prior PET, moderate in size. There is a small right pleural effusion that is new. 7 mm right lower lobe pulmonary nodule image 63 series 6 is unchanged from prior exam. Again seen advanced emphysema. Biapical pleuroparenchymal scarring. Upper Abdomen: No acute findings, paucity of intra-abdominal fat limits assessment. Musculoskeletal: Generalized cachexia. No discrete lytic or blastic osseous lesions. No acute osseous abnormalities. Review of the MIP images confirms the above findings. IMPRESSION: 1. No pulmonary embolus. 2. Grossly unchanged left infrahilar pulmonary mass since PET-CT 5 weeks ago. Increased patchy and nodular opacities in the more distal left lower lobe likely postobstructive pneumonitis. Unchanged size of bilateral hilar lymph nodes, previously not hypermetabolic on PET. 3. Development of partially loculated moderate left pleural effusion and small right pleural effusion, new. 4. Advanced emphysema. Aortic Atherosclerosis (ICD10-I70.0) and Emphysema (ICD10-J43.9). Electronically Signed   By: Keith Rake M.D.   On: 09/16/2017 23:20   Mr Jeri Cos BZ Contrast  Result Date: 09/11/2017 CLINICAL  DATA:  72 year old female recently diagnosed with lung cancer. Staging. EXAM: MRI HEAD WITHOUT AND WITH CONTRAST TECHNIQUE: Multiplanar, multiecho pulse sequences of the brain and surrounding structures were obtained without and with intravenous contrast. CONTRAST:  72mL MULTIHANCE GADOBENATE DIMEGLUMINE 529 MG/ML IV SOLN COMPARISON:  PET-CT 08/03/2017. FINDINGS: Brain: No abnormal enhancement identified. No midline shift, mass effect, or evidence of intracranial mass lesion. No dural thickening. No restricted diffusion to suggest acute infarction. No ventriculomegaly, extra-axial collection or acute intracranial hemorrhage. Cervicomedullary junction and pituitary are within normal limits. Pearline Cables and white matter signal is within normal limits for age throughout the brain. Vascular: Major intracranial vascular flow voids are preserved. The major dural venous sinuses are enhancing and appear patent. Skull and upper cervical spine: Visualized bone marrow signal is within normal limits. There are scattered small benign T1 intrinsic hemangiomas of the calvarium (series 11, image 127). Negative visible cervical spine and spinal cord. Sinuses/Orbits: Normal orbits soft tissues. Paranasal sinuses are clear. Other: Mild mastoid effusions. Negative nasopharynx. Visible internal auditory structures appear normal. Scalp and face soft tissues appear negative. IMPRESSION: 1. No metastatic disease or acute intracranial abnormality. Normal for age MRI appearance of the brain. 2. Mild bilateral mastoid effusions are probably postinflammatory and appear inconsequential. Electronically Signed   By: Genevie Ann M.D.   On: 09/11/2017 13:14   Dg Chest Port 1 View  Result Date: 08/31/2017 CLINICAL DATA:  Right pneumothorax. EXAM: PORTABLE CHEST 1 VIEW COMPARISON:  None. FINDINGS: Stable apical pneumothorax extending to the posterior right fourth rib level similar to prior. No mediastinal shift. Heart and mediastinal contours are stable.  Masslike opacity about the infrahilar portion of the left lung is redemonstrated though there is now developing airspace opacities at the left lung base that could represent atelectasis or pneumonia. Stable pleural thickening at the left lung base laterally versus small loculated left effusion. Heart size is normal. There is mild aortic atherosclerosis. IMPRESSION: 1. Stable right apical pneumothorax extending to the fourth posterior rib level as before. No mediastinal shift or tension. 2. Evolving left basilar airspace disease. Cannot exclude pneumonia. Electronically Signed   By: Shanon Brow  Randel Pigg M.D.   On: 08/31/2017 16:56       Bill Salinas

## 2017-09-20 NOTE — Patient Instructions (Signed)
Eating Healthy on a Budget There are many ways to save money at the grocery store and continue to eat healthy. You can be successful if you plan your meals according to your budget, purchase according to your budget and grocery list, and prepare food yourself. How can I buy more food on a limited budget? Plan  Plan meals and snacks according to a grocery list and budget you create.  Look for recipes where you can cook once and make enough food for two meals.  Include meals that will "stretch" more expensive foods such as stews, casseroles, and stir-fry dishes.  Make a grocery list and make sure to bring it with you to the store. If you have a smart phone, you could use your phone to create your shopping list. Purchase  When grocery shopping, buy only the items on your grocery list and go only to the areas of the store that have the items on your list. Prepare  Some meal items can be prepared in advance. Pre-cook on days when you have extra time.  Make extra food (such as by doubling recipes) and freeze the extras in meal-sized containers or in individual portions for fast meals and snacks.  Use leftovers in your meal plan for the week.  Try some meatless meals or try "no cook" meals like salads.  When you come home from the grocery store, wash and prepare your fruits and vegetables so they are ready to use and eat. This will help reduce food waste. How can I buy more food on a limited budget? Try these tips the next time you go shopping:  [employer] store brands or generic brands.  Use coupons only for foods and brands you normally buy. Avoid buying items you wouldn't normally buy simply because they are on sale.  Check online and in newspapers for weekly deals.  Buy healthy items from the bulk bins when available, such as herbs, spices, flours, pastas, nuts, and dried fruit.  Buy fruits and vegetables that are in season. Prices are usually lower on in-season produce.  Compare and  contrast different items. You can do this by looking at the unit price on the price tag. Use it to compare different brands and sizes to find out which item is the best deal.  Choose naturally low-cost healthy items, such as carrots, potatoes, apples, bananas, and oranges. Dried or canned beans are a low-cost protein source.  Buy in bulk and freeze extra food. Items you can buy in bulk include meats, fish, poultry, frozen fruits, and frozen vegetables.  Limit the purchase of prepared or "ready-to-eat" foods, such as pre-cut fruits and vegetables and pre-made salads.  If possible, shop around to discover which grocery store offers the best prices. Some stores charge much more than other stores for the same items.  Do not shop when you are hungry. If you shop while hungry, It may be hard to stick to your list and budget.  Stick to your list and resist impulse buys. Treat your list as your official plan for the week.  Buy a variety of vegetables and fruit by purchasing fresh, frozen, and canned items.  Look beyond eye level. Foods at eye level (adult or child eye level) are more expensive. Look at the top and bottom shelves for deals.  Be efficient with your time when shopping. The more time you spend at the store, the more money you are likely to spend.  Consider other retailers such as dollar stores, larger wholesale  stores, local fruit and vegetable stands, and farmers markets.  What are some tips for less expensive food substitutions? When choosing more expensive foods like meats and dairy, try these tips to save money:  Choose cheaper cuts of meat, such as bone-in chicken thighs and drumsticks instead skinless and boneless chicken. When you are ready to prepare the chicken, you can remove the skin yourself to make it healthier.  Choose lean meats like chicken or [place]. When choosing ground beef, make sure it is lean ground beef (92% lean, 8% fat). If you do buy a fattier ground beef,  drain the fat before eating.  Buy dried beans and peas, such as lentils, split peas, or kidney beans.  For seafood, choose canned tuna, salmon, or sardines.  Eggs are a low-cost source of protein.  Buy the larger tubs of yogurt instead of individual-sized containers.  Choose water instead of sodas and other sweetened beverages.  Skip buying chips, cookies, and other "junk food". These items are usually expensive, high in calories, and low in nutritional value.  How can I prepare the foods I buy in the healthiest way? Practice these tips for cooking foods in the healthiest way to reduce excess fat and calorie intake:  Steam, saute, grill, or bake foods instead of frying them.  Make sure half your plate is filled with fruits or vegetables. Choose from fresh, frozen, or canned fruits and vegetables. If eating canned, remember to rinse them before eating. This will remove any excess salt added for packaging.  Trim all fat from meat before cooking. Remove the skin from chicken or [place].  Spoon off fat from meat dishes once they have been chilled in the refrigerator and the fat has hardened on the top.  Use skim milk, low-fat milk, or evaporated skim milk when making cream sauces, soups, or puddings.  Substitute low-fat yogurt, sour cream, or cottage cheese for sour cream and mayonnaise in dips and dressings.  Try lemon juice, herbs, or spices to season food instead of salt, butter, or margarine.  This information is not intended to replace advice given to you by your health care provider. Make sure you discuss any questions you have with your health care provider. Document Released: 08/29/2013 Document Revised: 07/16/2015 Document Reviewed: 07/29/2013 Elsevier Interactive Patient Education  [employer].

## 2017-09-20 NOTE — Telephone Encounter (Signed)
Pt scheduled per 9/12 sch message

## 2017-09-20 NOTE — Telephone Encounter (Signed)
Pt received 1st time Taxol/Carbo on  9/9- PIV to R wrist. Pt called today with c/o pain and redness to left arm. Pt states she thought this was same arm she had PIV in. Pain goes to shoulder. Denies fever or chills.Pt coming info. radiation today and asked to be seen in [facility]. Message to scheduling, labs entered.

## 2017-09-21 ENCOUNTER — Ambulatory Visit
Admission: RE | Admit: 2017-09-21 | Discharge: 2017-09-21 | Disposition: A | Discharge status: Home / Self Care | Payer: PPO | Source: Ambulatory Visit | Attending: Radiation Oncology | Admitting: Radiation Oncology

## 2017-09-21 DIAGNOSIS — Z510 Encounter for antineoplastic radiation therapy: Secondary | ICD-10-CM | POA: Diagnosis not present

## 2017-09-21 DIAGNOSIS — C3432 Malignant neoplasm of lower lobe, left bronchus or lung: Secondary | ICD-10-CM | POA: Diagnosis not present

## 2017-09-23 ENCOUNTER — Ambulatory Visit: Payer: PPO

## 2017-09-23 NOTE — Progress Notes (Signed)
[place]  [phone] [phone]  Clinic Follow up Note   Patient Care Team: Sherry Harvey, NP as PCP - General (Nurse Practitioner) 09/24/2017  SUMMARY OF ONCOLOGIC HISTORY: Oncology History   Patient presented with cough. Work up showed LLL lung mass.      Mediastinal mass   07/13/2017 Imaging    CT Chest    08/03/2017 Imaging    PET IMPRESSION: Hypermetabolic LEFT lower lobe mass abutting the pleural surface is consistent with bronchogenic carcinoma.    08/23/2017 Initial Diagnosis    Mediastinal mass    08/31/2017 Surgery    Bronchoscopy    09/05/2017 Pathology Results    Lung, biopsy, Left Lower Lobe - SQUAMOUS CELL CARCINOMA, POORLY DIFFERENTIATED - SEE COMMENT Microscopic Comment The neoplastic cells are positive for cytokeratin 5/6 and p63 but negative for TTF-1, synaptophysin and CD56. The overall features are consistent with a poorly differentiated squamous cell carcinoma.    09/11/2017 Imaging    MRI Brain No metastatic disease or acute intracranial abnormality. Normal for age MRI appearance of the brain.    09/11/2017 -  Radiation Therapy    SIM     Primary malignant neoplasm of bronchus of left lower lobe ([place])   08/26/2017 Initial Diagnosis    Primary malignant neoplasm of bronchus of left lower lobe ([place])    09/17/2017 -  Chemotherapy    The patient had palonosetron (ALOXI) injection 0.25 mg, 0.25 mg, Intravenous,  Once, 1 of 7 cycles Administration: 0.25 mg (09/17/2017) CARBOplatin (PARAPLATIN) 100 mg in sodium chloride 0.9 % 100 mL chemo infusion, 100 mg (100 % of original dose 102.2 mg), Intravenous,  Once, 1 of 7 cycles Dose modification: 102.2 mg (original dose 102.2 mg, Cycle 1) Administration: 100 mg (09/17/2017) PACLitaxel (TAXOL) 54 mg in sodium chloride 0.9 % 150 mL chemo infusion (</= 32m/m2), 45 mg/m2 = 54 mg, Intravenous,  Once, 1 of 7 cycles Administration: 54 mg (09/17/2017)  for chemotherapy treatment.    CURRENT THERAPY: Concurrent chemo RT taxol 45 mg/m2 and carboplatin AUC 2, began 09/17/17   INTERVAL HISTORY: [name] for follow up and second weekly carbo/taxol as scheduled. She was last seen by Dr. MJulien Nordmannon 8/30. [facility] that was not felt to be significant. She went to ER on 09/16/17 for chest pain and palpitations. EKG with sinus tachy. CTA was negative for PE. She denies recurrent episodes. She began chemoRT on 9/9. Denies reaction to chemo itself or difficulty with the infusion. [facility] in [facility] feet and arm. Diuresis was not recommended. She elevated her legs over the weekend and swelling resolved. She has been mildly fatigued since treatment began, but remains able to function independently at home. She rested a lot this weekend and slept through meals. She lost 3 lbs, but reports she has an appetite. She has met with dietician. No BM in 3 days, with mild abd pain. She plans to use laxative today once she gets home. Denies n/v/d. Chronic dry cough is at baseline. She could not afford hydrocodone cough syrup and believes robitussin works just fine. Denies chest pain, dyspnea, wheezing, hemoptysis. Denies neuropathy.    MEDICAL HISTORY:  Past Medical History:  Diagnosis Date  . Allergy   . Anxiety   . Arthritis   . Cavitating mass in left lower lung lobe   . Complication of anesthesia    woke up during  procedure  . COPD (chronic obstructive pulmonary disease) ([place])   . Depression   . Family history of adverse reaction to anesthesia    " my mother was always hard to wake up"  . Full dentures   . Headache   . Neuromuscular disorder ([place])   . Pneumonia   . Wears glasses     SURGICAL HISTORY: Past Surgical History:  Procedure Laterality Date  . ELBOW SURGERY     for  "Tennis Elbow"  . hand surgery    . MULTIPLE TOOTH EXTRACTIONS    . TUBAL LIGATION    . VIDEO BRONCHOSCOPY N/A 08/31/2017   Procedure:  VIDEO BRONCHOSCOPY;  Surgeon: Sherry Nakayama, MD;  Location: [name];  Service: Thoracic;  Laterality: N/A;    I have reviewed the social history and family history with the patient and they are unchanged from previous note.  ALLERGIES:  is allergic to penicillins.  MEDICATIONS:  Current Outpatient Medications  Medication Sig Dispense Refill  . cyanocobalamin (,VITAMIN B-12,) 1000 MCG/ML injection Inject 1,000 mcg into the muscle every 30 (thirty) days. Administered by Sherry [name]    . ENSURE (ENSURE) Take 237 mLs by mouth daily.    Marland Kitchen guaifenesin (ROBITUSSIN) 100 MG/5ML syrup Take by mouth daily.    . meloxicam (MOBIC) 7.5 MG tablet Take 1 tablet (7.5 mg total) by mouth daily. 15 tablet 0  . Menthol-Ascorbic Acid (LUDENS COUGH DROPS MT) Use as directed 1 lozenge in the mouth or throat 3 (three) times daily.     . Multiple Vitamin (MULTIVITAMIN WITH MINERALS) TABS tablet Take 1 tablet by mouth daily.    . naproxen sodium (ALEVE) 220 MG tablet Take 220 mg by mouth daily as needed (headache).    . nystatin (MYCOSTATIN) 100000 UNIT/ML suspension Take 5 mLs by mouth See admin instructions. Swish and spit 5 mls daily  0  . gabapentin (NEURONTIN) 250 MG/5ML solution Take 3 mLs (150 mg total) by mouth at bedtime. 100 mL 5  . HYDROcodone-homatropine (HYCODAN) 5-1.5 MG/5ML syrup Take 5 mLs by mouth at bedtime as needed for cough. (Patient not taking: Reported on 09/24/2017) 240 mL 0  . methylPREDNISolone (MEDROL DOSEPAK) 4 MG TBPK tablet Use as instructed 21 tablet 0   No current facility-administered medications for this visit.     PHYSICAL EXAMINATION: ECOG PERFORMANCE STATUS: 1 - Symptomatic but completely ambulatory  Vitals:   09/24/17 1110  BP: 122/66  Pulse: 88  Resp: 18  Temp: 98.3 F (36.8 C)  SpO2: 97%   Filed Weights   09/24/17 1110  Weight: 71 lb 12.8 oz (32.6 kg)    GENERAL:alert, no distress and comfortable SKIN: skin color, texture, turgor are normal, no rashes  or significant lesions EYES: sclera clear OROPHARYNX:no thrush or ulcers LYMPH:  no palpable cervical or supraclavicular lymphadenopathy  LUNGS: clear to auscultation with normal breathing effort HEART: regular rate & rhythm, no lower extremity edema ABDOMEN:abdomen soft, non-tender and normal bowel sounds Musculoskeletal:no cyanosis of digits and no clubbing  NEURO: alert & oriented x 3 with fluent speech, no focal motor/sensory deficits  LABORATORY DATA:  I have reviewed the data as listed CBC Latest Ref Rng & Units 09/24/2017 09/20/2017 09/17/2017  WBC 3.9 - 10.3 K/uL 8.1 10.0 9.5  Hemoglobin 11.6 - 15.9 g/dL 10.5(L) 10.1(L) 9.9(L)  Hematocrit 34.8 - 46.6 % 35.0 33.8(L) 31.3(L)  Platelets 145 - 400 K/uL 441(H) 419(H) 431(H)     CMP Latest Ref Rng & Units 09/24/2017 09/20/2017 09/17/2017  Glucose 70 - 99 mg/dL 80 100(H) 75  BUN 8 - 23 mg/dL _0 Creatinine 0.44 - 1.00 mg/dL 0.66 0.60 0.55  Sodium 135 - 145 mmol/L 142 141 142  Potassium 3.5 - 5.1 mmol/L 3.7 3.9 3.7  Chloride 98 - 111 mmol/L 103 103 107  CO2 22 - 32 mmol/L 31 33(H) 27  Calcium 8.9 - 10.3 mg/dL 10.7(H) 9.5 9.1  Total Protein 6.5 - 8.1 g/dL 7.2 6.3(L) 5.9(L)  Total Bilirubin 0.3 - 1.2 mg/dL 0.5 0.4 0.2(L)  Alkaline Phos 38 - 126 U/L 93 96 85  AST 15 - 41 U/L 11(L) 12(L) 12(L)  ALT 0 - 44 U/L <_1 RADIOGRAPHIC STUDIES: I have personally reviewed the radiological images as listed and agreed with the findings in the report. No results found.   ASSESSMENT & PLAN:  Primary malignant neoplasm of bronchus of left lower lobe Ms. Brougher appears stable. She began concurrent chemoRT with carboplatin AUC 2 and taxol 45 mg/m2 on 9/9. She tolerated her first cycle well, with mild fatigue. I encouraged her to remain active and perform ADLs to help fight fatigue. Her respiratory function is at baseline, she uses robitussin for cough.   She was [facility] in [facility] for concern for swelling, which resolved with leg elevation.  Diuresis was not recommended due to her baseline BP and she lives alone. I recommend she elevated legs if swelling returns and to call us if swelling is asymmetric of calf pain, she agrees.   For weight loss and malnutrition, she has seen our dietician. I recommend she begin nutrition supplement with boost or ensure, and take at least 1 per day. She agrees. For constipation, I recommend she eat routine meals and increase hydration. She will use laxative today and continue PRN due to no BM in 3 days.   Labs reviewed, CBC and CMP adequate to proceed with cycle 2 carbo/taxol at current doses. She will continue daily RT, return in 1 week for cycle 3 chemo, and f/u with MD in 2 weeks with cycle 4. I reviewed the plan with Dr. Julien [name].   All questions were answered. The patient knows to call the clinic with any problems, questions or concerns. No barriers to learning was detected.     Alla Feeling, NP 09/24/17

## 2017-09-24 ENCOUNTER — Telehealth: Payer: Self-pay | Admitting: Hematology

## 2017-09-24 ENCOUNTER — Other Ambulatory Visit: Payer: PPO

## 2017-09-24 ENCOUNTER — Inpatient Hospital Stay: Payer: PPO

## 2017-09-24 ENCOUNTER — Inpatient Hospital Stay: Payer: PPO | Admitting: Nutrition

## 2017-09-24 ENCOUNTER — Ambulatory Visit
Admission: RE | Admit: 2017-09-24 | Discharge: 2017-09-24 | Disposition: A | Discharge status: Home / Self Care | Payer: PPO | Source: Ambulatory Visit | Attending: Radiation Oncology | Admitting: Radiation Oncology

## 2017-09-24 ENCOUNTER — Encounter: Payer: Self-pay | Admitting: Nurse Practitioner

## 2017-09-24 ENCOUNTER — Inpatient Hospital Stay (HOSPITAL_BASED_OUTPATIENT_CLINIC_OR_DEPARTMENT_OTHER): Payer: PPO | Admitting: Nurse Practitioner

## 2017-09-24 VITALS — BP 122/66 | HR 88 | Temp 98.3°F | Resp 18 | Ht 60.0 in | Wt 71.8 lb

## 2017-09-24 DIAGNOSIS — C3432 Malignant neoplasm of lower lobe, left bronchus or lung: Secondary | ICD-10-CM | POA: Diagnosis not present

## 2017-09-24 DIAGNOSIS — M25471 Effusion, right ankle: Secondary | ICD-10-CM | POA: Diagnosis not present

## 2017-09-24 DIAGNOSIS — Z5111 Encounter for antineoplastic chemotherapy: Secondary | ICD-10-CM | POA: Diagnosis not present

## 2017-09-24 DIAGNOSIS — E440 Moderate protein-calorie malnutrition: Secondary | ICD-10-CM | POA: Diagnosis not present

## 2017-09-24 DIAGNOSIS — Z510 Encounter for antineoplastic radiation therapy: Secondary | ICD-10-CM | POA: Diagnosis not present

## 2017-09-24 DIAGNOSIS — J449 Chronic obstructive pulmonary disease, unspecified: Secondary | ICD-10-CM | POA: Diagnosis not present

## 2017-09-24 DIAGNOSIS — M25472 Effusion, left ankle: Secondary | ICD-10-CM | POA: Diagnosis not present

## 2017-09-24 DIAGNOSIS — C3490 Malignant neoplasm of unspecified part of unspecified bronchus or lung: Secondary | ICD-10-CM

## 2017-09-24 LAB — CBC WITH DIFFERENTIAL (CANCER CENTER ONLY)
BASOS ABS: 0 10*3/uL (ref 0.0–0.1)
BASOS PCT: 1 %
EOS PCT: 1 %
Eosinophils Absolute: 0.1 10*3/uL (ref 0.0–0.5)
HCT: 35 % (ref 34.8–46.6)
Hemoglobin: 10.5 g/dL — ABNORMAL LOW (ref 11.6–15.9)
Lymphocytes Relative: 9 %
Lymphs Abs: 0.7 10*3/uL — ABNORMAL LOW (ref 0.9–3.3)
MCH: 25.7 pg (ref 25.1–34.0)
MCHC: 30 g/dL — AB (ref 31.5–36.0)
MCV: 85.8 fL (ref 79.5–101.0)
MONOS PCT: 11 %
Monocytes Absolute: 0.9 10*3/uL (ref 0.1–0.9)
NEUTROS ABS: 6.4 10*3/uL (ref 1.5–6.5)
Neutrophils Relative %: 78 %
PLATELETS: 441 10*3/uL — AB (ref 145–400)
RBC: 4.08 MIL/uL (ref 3.70–5.45)
RDW: 17.2 % — AB (ref 11.2–14.5)
WBC Count: 8.1 10*3/uL (ref 3.9–10.3)

## 2017-09-24 LAB — CMP (CANCER CENTER ONLY)
ALBUMIN: 2.8 g/dL — AB (ref 3.5–5.0)
ALK PHOS: 93 U/L (ref 38–126)
AST: 11 U/L — AB (ref 15–41)
Anion gap: 8 (ref 5–15)
BILIRUBIN TOTAL: 0.5 mg/dL (ref 0.3–1.2)
BUN: 9 mg/dL (ref 8–23)
CALCIUM: 10.7 mg/dL — AB (ref 8.9–10.3)
CO2: 31 mmol/L (ref 22–32)
Chloride: 103 mmol/L (ref 98–111)
Creatinine: 0.66 mg/dL (ref 0.44–1.00)
GFR, Est AFR Am: >60 mL/min (ref >60)
GFR, Estimated: >60 mL/min (ref >60)
Glucose, Bld: 80 mg/dL (ref 70–99)
Potassium: 3.7 mmol/L (ref 3.5–5.1)
SODIUM: 142 mmol/L (ref 135–145)
TOTAL PROTEIN: 7.2 g/dL (ref 6.5–8.1)

## 2017-09-24 MED ORDER — SODIUM CHLORIDE 0.9 % IV SOLN
45.0000 mg/m2 | Freq: Once | INTRAVENOUS | Status: AC
  Administered 2017-09-24: 54 mg via INTRAVENOUS
  Filled 2017-09-24: qty 9

## 2017-09-24 MED ORDER — SODIUM CHLORIDE 0.9 % IV SOLN
102.2000 mg | Freq: Once | INTRAVENOUS | Status: AC
  Administered 2017-09-24: 100 mg via INTRAVENOUS
  Filled 2017-09-24: qty 10

## 2017-09-24 MED ORDER — FAMOTIDINE IN NACL 20-0.9 MG/50ML-% IV SOLN
INTRAVENOUS | Status: AC
  Filled 2017-09-24: qty 50

## 2017-09-24 MED ORDER — SODIUM CHLORIDE 0.9 % IV SOLN
Freq: Once | INTRAVENOUS | Status: AC
  Administered 2017-09-24: 13:00:00 via INTRAVENOUS
  Filled 2017-09-24: qty 250

## 2017-09-24 MED ORDER — PALONOSETRON HCL INJECTION 0.25 MG/5ML
0.2500 mg | Freq: Once | INTRAVENOUS | Status: AC
  Administered 2017-09-24: 0.25 mg via INTRAVENOUS

## 2017-09-24 MED ORDER — SODIUM CHLORIDE 0.9 % IV SOLN
20.0000 mg | Freq: Once | INTRAVENOUS | Status: AC
  Administered 2017-09-24: 20 mg via INTRAVENOUS
  Filled 2017-09-24: qty 2

## 2017-09-24 MED ORDER — DIPHENHYDRAMINE HCL 50 MG/ML IJ SOLN
50.0000 mg | Freq: Once | INTRAMUSCULAR | Status: AC
  Administered 2017-09-24: 50 mg via INTRAVENOUS

## 2017-09-24 MED ORDER — FAMOTIDINE IN NACL 20-0.9 MG/50ML-% IV SOLN
20.0000 mg | Freq: Once | INTRAVENOUS | Status: AC
  Administered 2017-09-24: 20 mg via INTRAVENOUS

## 2017-09-24 MED ORDER — PALONOSETRON HCL INJECTION 0.25 MG/5ML
INTRAVENOUS | Status: AC
  Filled 2017-09-24: qty 5

## 2017-09-24 MED ORDER — DIPHENHYDRAMINE HCL 50 MG/ML IJ SOLN
INTRAMUSCULAR | Status: AC
  Filled 2017-09-24: qty 1

## 2017-09-24 MED ORDER — RADIAPLEXRX EX GEL
Freq: Once | CUTANEOUS | Status: AC
  Administered 2017-09-24: 10:00:00 via TOPICAL

## 2017-09-24 NOTE — Progress Notes (Signed)
Nutrition follow-up completed with patient during infusion for lung cancer. Weight documented is 71.8 pounds July 16 decreased from 73.9 pounds September 4. Patient states she will gain a couple pounds and then she loses a couple pounds. She did report her swelling in her feet have decreased. No nutrition impact symptoms.  Nutrition diagnosis: Severe malnutrition continues.  Intervention: Educated patient to continue oral nutrition supplements 3 times daily between meals. Encouraged her to continue to meals and snacks as tolerated.  Monitoring, evaluation, goals: Patient will tolerate increased calories and protein to minimize further weight loss.  Next visit: To be scheduled as needed.  **Disclaimer: This note was dictated with voice recognition software. Similar sounding words can inadvertently be transcribed and this note may contain transcription errors which may not have been corrected upon publication of note.**

## 2017-09-24 NOTE — Patient Instructions (Signed)
[facility] Discharge Instructions for Patients Receiving Chemotherapy  Today you received the following chemotherapy agents:  Taxol, Carboplatin  To help prevent nausea and vomiting after your treatment, we encourage you to take your nausea medication as prescribed.   If you develop nausea and vomiting that is not controlled by your nausea medication, call the clinic.   BELOW ARE SYMPTOMS THAT SHOULD BE REPORTED IMMEDIATELY:  *FEVER GREATER THAN 100.5 F  *CHILLS WITH OR WITHOUT FEVER  NAUSEA AND VOMITING THAT IS NOT CONTROLLED WITH YOUR NAUSEA MEDICATION  *UNUSUAL SHORTNESS OF BREATH  *UNUSUAL BRUISING OR BLEEDING  TENDERNESS IN MOUTH AND THROAT WITH OR WITHOUT PRESENCE OF ULCERS  *URINARY PROBLEMS  *BOWEL PROBLEMS  UNUSUAL RASH Items with * indicate a potential emergency and should be followed up as soon as possible.  Feel free to call the clinic should you have any questions or concerns. The clinic phone number is [phone].  Please show the CHEMO ALERT CARD at check-in to the Emergency Department and triage nurse.   

## 2017-09-25 ENCOUNTER — Ambulatory Visit
Admission: RE | Admit: 2017-09-25 | Discharge: 2017-09-25 | Disposition: A | Discharge status: Home / Self Care | Payer: PPO | Source: Ambulatory Visit | Attending: Radiation Oncology | Admitting: Radiation Oncology

## 2017-09-25 ENCOUNTER — Telehealth: Payer: Self-pay | Admitting: Medical Oncology

## 2017-09-25 DIAGNOSIS — Z510 Encounter for antineoplastic radiation therapy: Secondary | ICD-10-CM | POA: Diagnosis not present

## 2017-09-25 DIAGNOSIS — C3432 Malignant neoplasm of lower lobe, left bronchus or lung: Secondary | ICD-10-CM | POA: Diagnosis not present

## 2017-09-25 NOTE — Telephone Encounter (Signed)
Can she get vit b 12 injection tomorrow- I told her it was okay.

## 2017-09-26 ENCOUNTER — Ambulatory Visit
Admission: RE | Admit: 2017-09-26 | Discharge: 2017-09-26 | Disposition: A | Discharge status: Home / Self Care | Payer: PPO | Source: Ambulatory Visit | Attending: Radiation Oncology | Admitting: Radiation Oncology

## 2017-09-26 ENCOUNTER — Telehealth: Payer: Self-pay | Admitting: Internal Medicine

## 2017-09-26 DIAGNOSIS — C3432 Malignant neoplasm of lower lobe, left bronchus or lung: Secondary | ICD-10-CM | POA: Diagnosis not present

## 2017-09-26 DIAGNOSIS — Z510 Encounter for antineoplastic radiation therapy: Secondary | ICD-10-CM | POA: Diagnosis not present

## 2017-09-26 NOTE — Telephone Encounter (Signed)
Appts scheduled/ in basket message to Dr Julien Nordmann regarding appt for 9/30/ per 9/16 los

## 2017-09-27 ENCOUNTER — Ambulatory Visit
Admission: RE | Admit: 2017-09-27 | Discharge: 2017-09-27 | Disposition: A | Discharge status: Home / Self Care | Payer: PPO | Source: Ambulatory Visit | Attending: Radiation Oncology | Admitting: Radiation Oncology

## 2017-09-27 DIAGNOSIS — Z510 Encounter for antineoplastic radiation therapy: Secondary | ICD-10-CM | POA: Diagnosis not present

## 2017-09-27 DIAGNOSIS — C3432 Malignant neoplasm of lower lobe, left bronchus or lung: Secondary | ICD-10-CM | POA: Diagnosis not present

## 2017-09-28 ENCOUNTER — Ambulatory Visit
Admission: RE | Admit: 2017-09-28 | Discharge: 2017-09-28 | Disposition: A | Discharge status: Home / Self Care | Payer: PPO | Source: Ambulatory Visit | Attending: Radiation Oncology | Admitting: Radiation Oncology

## 2017-09-28 DIAGNOSIS — E538 Deficiency of other specified B group vitamins: Secondary | ICD-10-CM | POA: Diagnosis not present

## 2017-09-28 DIAGNOSIS — C3432 Malignant neoplasm of lower lobe, left bronchus or lung: Secondary | ICD-10-CM | POA: Diagnosis not present

## 2017-09-28 DIAGNOSIS — Z510 Encounter for antineoplastic radiation therapy: Secondary | ICD-10-CM | POA: Diagnosis not present

## 2017-09-30 ENCOUNTER — Ambulatory Visit: Payer: PPO

## 2017-10-01 ENCOUNTER — Ambulatory Visit
Admission: RE | Admit: 2017-10-01 | Discharge: 2017-10-01 | Disposition: A | Discharge status: Home / Self Care | Payer: PPO | Source: Ambulatory Visit | Attending: Radiation Oncology | Admitting: Radiation Oncology

## 2017-10-01 ENCOUNTER — Inpatient Hospital Stay: Payer: PPO

## 2017-10-01 VITALS — BP 96/53 | HR 79 | Temp 98.8°F | Resp 19 | Wt <= 1120 oz

## 2017-10-01 DIAGNOSIS — Z510 Encounter for antineoplastic radiation therapy: Secondary | ICD-10-CM | POA: Diagnosis not present

## 2017-10-01 DIAGNOSIS — C3432 Malignant neoplasm of lower lobe, left bronchus or lung: Secondary | ICD-10-CM

## 2017-10-01 DIAGNOSIS — Z5111 Encounter for antineoplastic chemotherapy: Secondary | ICD-10-CM | POA: Diagnosis not present

## 2017-10-01 DIAGNOSIS — C3490 Malignant neoplasm of unspecified part of unspecified bronchus or lung: Secondary | ICD-10-CM

## 2017-10-01 LAB — CBC WITH DIFFERENTIAL (CANCER CENTER ONLY)
Basophils Absolute: 0 10*3/uL (ref 0.0–0.1)
Basophils Relative: 1 %
Eosinophils Absolute: 0.1 10*3/uL (ref 0.0–0.5)
Eosinophils Relative: 2 %
HCT: 29.1 % — ABNORMAL LOW (ref 34.8–46.6)
HEMOGLOBIN: 9.4 g/dL — AB (ref 11.6–15.9)
LYMPHS ABS: 0.5 10*3/uL — AB (ref 0.9–3.3)
LYMPHS PCT: 12 %
MCH: 26.6 pg (ref 25.1–34.0)
MCHC: 32.4 g/dL (ref 31.5–36.0)
MCV: 82.1 fL (ref 79.5–101.0)
MONOS PCT: 15 %
Monocytes Absolute: 0.7 10*3/uL (ref 0.1–0.9)
NEUTROS ABS: 3.2 10*3/uL (ref 1.5–6.5)
NEUTROS PCT: 70 %
Platelet Count: 390 10*3/uL (ref 145–400)
RBC: 3.55 MIL/uL — AB (ref 3.70–5.45)
RDW: 17.5 % — ABNORMAL HIGH (ref 11.2–14.5)
WBC Count: 4.6 10*3/uL (ref 3.9–10.3)

## 2017-10-01 LAB — CMP (CANCER CENTER ONLY)
ALK PHOS: 84 U/L (ref 38–126)
ALT: 7 U/L (ref 0–44)
AST: 12 U/L — AB (ref 15–41)
Albumin: 2.7 g/dL — ABNORMAL LOW (ref 3.5–5.0)
Anion gap: 8 (ref 5–15)
BILIRUBIN TOTAL: 0.3 mg/dL (ref 0.3–1.2)
BUN: 11 mg/dL (ref 8–23)
CALCIUM: 9.8 mg/dL (ref 8.9–10.3)
CHLORIDE: 101 mmol/L (ref 98–111)
CO2: 31 mmol/L (ref 22–32)
CREATININE: 0.64 mg/dL (ref 0.44–1.00)
GFR, Est AFR Am: >60 mL/min (ref >60)
Glucose, Bld: 90 mg/dL (ref 70–99)
Potassium: 3.2 mmol/L — ABNORMAL LOW (ref 3.5–5.1)
Sodium: 140 mmol/L (ref 135–145)
TOTAL PROTEIN: 6.4 g/dL — AB (ref 6.5–8.1)

## 2017-10-01 MED ORDER — DIPHENHYDRAMINE HCL 50 MG/ML IJ SOLN
INTRAMUSCULAR | Status: AC
  Filled 2017-10-01: qty 1

## 2017-10-01 MED ORDER — SODIUM CHLORIDE 0.9 % IV SOLN
45.0000 mg/m2 | Freq: Once | INTRAVENOUS | Status: AC
  Administered 2017-10-01: 54 mg via INTRAVENOUS
  Filled 2017-10-01: qty 9

## 2017-10-01 MED ORDER — SODIUM CHLORIDE 0.9 % IV SOLN
102.2000 mg | Freq: Once | INTRAVENOUS | Status: AC
  Administered 2017-10-01: 100 mg via INTRAVENOUS
  Filled 2017-10-01: qty 10

## 2017-10-01 MED ORDER — SODIUM CHLORIDE 0.9 % IV SOLN
Freq: Once | INTRAVENOUS | Status: AC
  Administered 2017-10-01: 09:00:00 via INTRAVENOUS
  Filled 2017-10-01: qty 250

## 2017-10-01 MED ORDER — DIPHENHYDRAMINE HCL 50 MG/ML IJ SOLN
50.0000 mg | Freq: Once | INTRAMUSCULAR | Status: AC
  Administered 2017-10-01: 50 mg via INTRAVENOUS

## 2017-10-01 MED ORDER — FAMOTIDINE IN NACL 20-0.9 MG/50ML-% IV SOLN
20.0000 mg | Freq: Once | INTRAVENOUS | Status: AC
  Administered 2017-10-01: 20 mg via INTRAVENOUS

## 2017-10-01 MED ORDER — PALONOSETRON HCL INJECTION 0.25 MG/5ML
0.2500 mg | Freq: Once | INTRAVENOUS | Status: AC
  Administered 2017-10-01: 0.25 mg via INTRAVENOUS

## 2017-10-01 MED ORDER — FAMOTIDINE IN NACL 20-0.9 MG/50ML-% IV SOLN
INTRAVENOUS | Status: AC
  Filled 2017-10-01: qty 50

## 2017-10-01 MED ORDER — SODIUM CHLORIDE 0.9 % IV SOLN
20.0000 mg | Freq: Once | INTRAVENOUS | Status: AC
  Administered 2017-10-01: 20 mg via INTRAVENOUS
  Filled 2017-10-01: qty 2

## 2017-10-01 MED ORDER — PALONOSETRON HCL INJECTION 0.25 MG/5ML
INTRAVENOUS | Status: AC
  Filled 2017-10-01: qty 5

## 2017-10-01 MED ORDER — PROCHLORPERAZINE MALEATE 10 MG PO TABS: 10.0000 mg | ORAL_TABLET | Freq: Four times a day (QID) | ORAL | 1 refills | Status: DC | PRN

## 2017-10-01 NOTE — Patient Instructions (Signed)
[facility] Discharge Instructions for Patients Receiving Chemotherapy  Today you received the following chemotherapy agents: Taxol, Carbo  To help prevent nausea and vomiting after your treatment, we encourage you to take your nausea medication as directed.   If you develop nausea and vomiting that is not controlled by your nausea medication, call the clinic.   BELOW ARE SYMPTOMS THAT SHOULD BE REPORTED IMMEDIATELY:  *FEVER GREATER THAN 100.5 F  *CHILLS WITH OR WITHOUT FEVER  NAUSEA AND VOMITING THAT IS NOT CONTROLLED WITH YOUR NAUSEA MEDICATION  *UNUSUAL SHORTNESS OF BREATH  *UNUSUAL BRUISING OR BLEEDING  TENDERNESS IN MOUTH AND THROAT WITH OR WITHOUT PRESENCE OF ULCERS  *URINARY PROBLEMS  *BOWEL PROBLEMS  UNUSUAL RASH Items with * indicate a potential emergency and should be followed up as soon as possible.  Feel free to call the clinic should you have any questions or concerns. The clinic phone number is [phone].  Please show the [place] at check-in to the Emergency Department and triage nurse.

## 2017-10-02 ENCOUNTER — Ambulatory Visit
Admission: RE | Admit: 2017-10-02 | Discharge: 2017-10-02 | Disposition: A | Discharge status: Home / Self Care | Payer: PPO | Source: Ambulatory Visit | Attending: Radiation Oncology | Admitting: Radiation Oncology

## 2017-10-02 DIAGNOSIS — C3432 Malignant neoplasm of lower lobe, left bronchus or lung: Secondary | ICD-10-CM | POA: Diagnosis not present

## 2017-10-02 DIAGNOSIS — Z510 Encounter for antineoplastic radiation therapy: Secondary | ICD-10-CM | POA: Diagnosis not present

## 2017-10-03 ENCOUNTER — Telehealth: Payer: Self-pay | Admitting: *Deleted

## 2017-10-03 ENCOUNTER — Ambulatory Visit
Admission: RE | Admit: 2017-10-03 | Discharge: 2017-10-03 | Disposition: A | Discharge status: Home / Self Care | Payer: PPO | Source: Ambulatory Visit | Attending: Radiation Oncology | Admitting: Radiation Oncology

## 2017-10-03 DIAGNOSIS — C3432 Malignant neoplasm of lower lobe, left bronchus or lung: Secondary | ICD-10-CM | POA: Diagnosis not present

## 2017-10-03 DIAGNOSIS — Z510 Encounter for antineoplastic radiation therapy: Secondary | ICD-10-CM | POA: Diagnosis not present

## 2017-10-03 NOTE — Telephone Encounter (Signed)
Oncology Nurse Navigator Documentation  Oncology Nurse Navigator Flowsheets 10/03/2017  Navigator Location [facility]  Navigator Encounter Type Telephone/I received a call from patient.  She has complaints of congestion. I called her back but was unable to reach her.    Telephone Outgoing Call  Treatment Phase Treatment  Barriers/Navigation Needs Education  Education Other  Interventions Education  Acuity Level 1  Time Spent with Patient 15

## 2017-10-04 ENCOUNTER — Ambulatory Visit
Admission: RE | Admit: 2017-10-04 | Discharge: 2017-10-04 | Disposition: A | Discharge status: Home / Self Care | Payer: PPO | Source: Ambulatory Visit | Attending: Radiation Oncology | Admitting: Radiation Oncology

## 2017-10-04 DIAGNOSIS — C3432 Malignant neoplasm of lower lobe, left bronchus or lung: Secondary | ICD-10-CM | POA: Diagnosis not present

## 2017-10-04 DIAGNOSIS — Z510 Encounter for antineoplastic radiation therapy: Secondary | ICD-10-CM | POA: Diagnosis not present

## 2017-10-05 ENCOUNTER — Ambulatory Visit
Admission: RE | Admit: 2017-10-05 | Discharge: 2017-10-05 | Disposition: A | Discharge status: Home / Self Care | Payer: PPO | Source: Ambulatory Visit | Attending: Radiation Oncology | Admitting: Radiation Oncology

## 2017-10-05 DIAGNOSIS — C3432 Malignant neoplasm of lower lobe, left bronchus or lung: Secondary | ICD-10-CM | POA: Diagnosis not present

## 2017-10-05 DIAGNOSIS — Z510 Encounter for antineoplastic radiation therapy: Secondary | ICD-10-CM | POA: Diagnosis not present

## 2017-10-08 ENCOUNTER — Inpatient Hospital Stay: Payer: PPO

## 2017-10-08 ENCOUNTER — Ambulatory Visit
Admission: RE | Admit: 2017-10-08 | Discharge: 2017-10-08 | Disposition: A | Discharge status: Home / Self Care | Payer: PPO | Source: Ambulatory Visit | Attending: Radiation Oncology | Admitting: Radiation Oncology

## 2017-10-08 ENCOUNTER — Telehealth: Payer: Self-pay | Admitting: Internal Medicine

## 2017-10-08 ENCOUNTER — Encounter: Payer: Self-pay | Admitting: Internal Medicine

## 2017-10-08 ENCOUNTER — Inpatient Hospital Stay (HOSPITAL_BASED_OUTPATIENT_CLINIC_OR_DEPARTMENT_OTHER): Payer: PPO | Admitting: Internal Medicine

## 2017-10-08 VITALS — BP 101/54 | HR 72 | Temp 98.8°F | Resp 17 | Ht 60.0 in | Wt 70.2 lb

## 2017-10-08 DIAGNOSIS — K5900 Constipation, unspecified: Secondary | ICD-10-CM

## 2017-10-08 DIAGNOSIS — F329 Major depressive disorder, single episode, unspecified: Secondary | ICD-10-CM

## 2017-10-08 DIAGNOSIS — C3432 Malignant neoplasm of lower lobe, left bronchus or lung: Secondary | ICD-10-CM

## 2017-10-08 DIAGNOSIS — Z510 Encounter for antineoplastic radiation therapy: Secondary | ICD-10-CM | POA: Diagnosis not present

## 2017-10-08 DIAGNOSIS — Z5111 Encounter for antineoplastic chemotherapy: Secondary | ICD-10-CM

## 2017-10-08 DIAGNOSIS — Z23 Encounter for immunization: Secondary | ICD-10-CM

## 2017-10-08 DIAGNOSIS — J449 Chronic obstructive pulmonary disease, unspecified: Secondary | ICD-10-CM

## 2017-10-08 DIAGNOSIS — C3490 Malignant neoplasm of unspecified part of unspecified bronchus or lung: Secondary | ICD-10-CM

## 2017-10-08 LAB — CMP (CANCER CENTER ONLY)
ALBUMIN: 2.6 g/dL — AB (ref 3.5–5.0)
ALT: 8 U/L (ref 0–44)
ANION GAP: 8 (ref 5–15)
AST: 14 U/L — ABNORMAL LOW (ref 15–41)
Alkaline Phosphatase: 89 U/L (ref 38–126)
BILIRUBIN TOTAL: 0.2 mg/dL — AB (ref 0.3–1.2)
BUN: 9 mg/dL (ref 8–23)
CALCIUM: 9.1 mg/dL (ref 8.9–10.3)
CO2: 31 mmol/L (ref 22–32)
CREATININE: 0.6 mg/dL (ref 0.44–1.00)
Chloride: 103 mmol/L (ref 98–111)
GFR, Estimated: >60 mL/min (ref >60)
GLUCOSE: 84 mg/dL (ref 70–99)
Potassium: 3.4 mmol/L — ABNORMAL LOW (ref 3.5–5.1)
Sodium: 142 mmol/L (ref 135–145)
TOTAL PROTEIN: 6 g/dL — AB (ref 6.5–8.1)

## 2017-10-08 LAB — CBC WITH DIFFERENTIAL (CANCER CENTER ONLY)
BASOS ABS: 0 10*3/uL (ref 0.0–0.1)
BASOS PCT: 1 %
EOS ABS: 0.1 10*3/uL (ref 0.0–0.5)
Eosinophils Relative: 1 %
HCT: 29.9 % — ABNORMAL LOW (ref 34.8–46.6)
Hemoglobin: 9.5 g/dL — ABNORMAL LOW (ref 11.6–15.9)
Lymphocytes Relative: 13 %
Lymphs Abs: 0.6 10*3/uL — ABNORMAL LOW (ref 0.9–3.3)
MCH: 26.2 pg (ref 25.1–34.0)
MCHC: 31.7 g/dL (ref 31.5–36.0)
MCV: 82.5 fL (ref 79.5–101.0)
MONO ABS: 0.8 10*3/uL (ref 0.1–0.9)
MONOS PCT: 19 %
NEUTROS ABS: 2.7 10*3/uL (ref 1.5–6.5)
Neutrophils Relative %: 66 %
PLATELETS: 420 10*3/uL — AB (ref 145–400)
RBC: 3.63 MIL/uL — ABNORMAL LOW (ref 3.70–5.45)
RDW: 17.5 % — AB (ref 11.2–14.5)
WBC Count: 4.1 10*3/uL (ref 3.9–10.3)

## 2017-10-08 MED ORDER — SODIUM CHLORIDE 0.9 % IV SOLN
102.2000 mg | Freq: Once | INTRAVENOUS | Status: AC
  Administered 2017-10-08: 100 mg via INTRAVENOUS
  Filled 2017-10-08: qty 10

## 2017-10-08 MED ORDER — PALONOSETRON HCL INJECTION 0.25 MG/5ML
0.2500 mg | Freq: Once | INTRAVENOUS | Status: AC
  Administered 2017-10-08: 0.25 mg via INTRAVENOUS

## 2017-10-08 MED ORDER — INFLUENZA VAC SPLIT QUAD 0.5 ML IM SUSY
PREFILLED_SYRINGE | INTRAMUSCULAR | Status: AC
  Filled 2017-10-08: qty 0.5

## 2017-10-08 MED ORDER — SODIUM CHLORIDE 0.9 % IV SOLN
45.0000 mg/m2 | Freq: Once | INTRAVENOUS | Status: AC
  Administered 2017-10-08: 54 mg via INTRAVENOUS
  Filled 2017-10-08: qty 9

## 2017-10-08 MED ORDER — DIPHENHYDRAMINE HCL 50 MG/ML IJ SOLN
INTRAMUSCULAR | Status: AC
  Filled 2017-10-08: qty 1

## 2017-10-08 MED ORDER — PALONOSETRON HCL INJECTION 0.25 MG/5ML
INTRAVENOUS | Status: AC
  Filled 2017-10-08: qty 5

## 2017-10-08 MED ORDER — FAMOTIDINE IN NACL 20-0.9 MG/50ML-% IV SOLN
INTRAVENOUS | Status: AC
  Filled 2017-10-08: qty 50

## 2017-10-08 MED ORDER — SODIUM CHLORIDE 0.9 % IV SOLN
Freq: Once | INTRAVENOUS | Status: AC
  Administered 2017-10-08: 10:00:00 via INTRAVENOUS
  Filled 2017-10-08: qty 250

## 2017-10-08 MED ORDER — DIPHENHYDRAMINE HCL 50 MG/ML IJ SOLN
50.0000 mg | Freq: Once | INTRAMUSCULAR | Status: AC
  Administered 2017-10-08: 50 mg via INTRAVENOUS

## 2017-10-08 MED ORDER — INFLUENZA VAC SPLIT QUAD 0.5 ML IM SUSY: 0.5000 mL | PREFILLED_SYRINGE | Freq: Once | INTRAMUSCULAR | Status: DC

## 2017-10-08 MED ORDER — FAMOTIDINE IN NACL 20-0.9 MG/50ML-% IV SOLN
20.0000 mg | Freq: Once | INTRAVENOUS | Status: AC
  Administered 2017-10-08: 20 mg via INTRAVENOUS

## 2017-10-08 MED ORDER — SODIUM CHLORIDE 0.9 % IV SOLN
20.0000 mg | Freq: Once | INTRAVENOUS | Status: AC
  Administered 2017-10-08: 20 mg via INTRAVENOUS
  Filled 2017-10-08: qty 2

## 2017-10-08 NOTE — Telephone Encounter (Signed)
Gave patient avs report and appointments for October  °

## 2017-10-08 NOTE — Patient Instructions (Signed)
[facility] Discharge Instructions for Patients Receiving Chemotherapy  Today you received the following chemotherapy agents: paclitaxel (Taxol) and carboplatin  To help prevent nausea and vomiting after your treatment, we encourage you to take your nausea medication as directed.   If you develop nausea and vomiting that is not controlled by your nausea medication, call the clinic.   BELOW ARE SYMPTOMS THAT SHOULD BE REPORTED IMMEDIATELY:  *FEVER GREATER THAN 100.5 F  *CHILLS WITH OR WITHOUT FEVER  NAUSEA AND VOMITING THAT IS NOT CONTROLLED WITH YOUR NAUSEA MEDICATION  *UNUSUAL SHORTNESS OF BREATH  *UNUSUAL BRUISING OR BLEEDING  TENDERNESS IN MOUTH AND THROAT WITH OR WITHOUT PRESENCE OF ULCERS  *URINARY PROBLEMS  *BOWEL PROBLEMS  UNUSUAL RASH Items with * indicate a potential emergency and should be followed up as soon as possible.  Feel free to call the clinic should you have any questions or concerns. The clinic phone number is [phone].  Please show the [place] at check-in to the Emergency Department and triage nurse.

## 2017-10-08 NOTE — Progress Notes (Signed)
[place] Telephone:([phone]) [phone]   Fax:([phone]) [phone]  OFFICE PROGRESS NOTE  [name], NP [place] [address]  DIAGNOSIS: Stage IIb/IIIa (T3, N0/N1, M0) non-small cell lung cancer poorly differentiated squamous cell carcinoma diagnosed in August 2019.  The patient presented with large superior segment left lower lobe mass with questionable left hilar adenopathy.  PRIOR THERAPY: None.  CURRENT THERAPY:Concurrent chemoradiation with weekly carboplatin for AUC of 2 and paclitaxel 45 mg/M2.  Status post 3 cycles.  INTERVAL HISTORY: Sherry [name] 72 y.o. female returns to the clinic today for follow-up visit.  The patient is feeling fine today with no concerning complaints.  She is tolerating her treatment with concurrent chemoradiation fairly well.  She denied having any chest pain, shortness of breath but continues to have dry cough with no hemoptysis.  She denied having any dysphagia or odynophagia.  She denied having any significant weight loss.  She had few days of constipation and she was trying prune juice with no improvement.  The patient is here today for evaluation before starting cycle #4 of her treatment.  MEDICAL HISTORY: Past Medical History:  Diagnosis Date  . Allergy   . Anxiety   . Arthritis   . Cavitating mass in left lower lung lobe   . Complication of anesthesia    woke up during procedure  . COPD (chronic obstructive pulmonary disease) ([place])   . Depression   . Family history of adverse reaction to anesthesia    " my mother was always hard to wake up"  . Full dentures   . Headache   . Neuromuscular disorder ([place])   . Pneumonia   . Wears glasses     ALLERGIES:  is allergic to penicillins.  MEDICATIONS:  Current Outpatient Medications  Medication Sig Dispense Refill  . cyanocobalamin (,VITAMIN B-12,) 1000 MCG/ML injection Inject 1,000 mcg into the muscle every 30 (thirty) days. Administered by Dr. Eldridge Abrahams    . ENSURE (ENSURE) Take 237 mLs by mouth daily.    Marland Kitchen gabapentin (NEURONTIN) 250 MG/5ML solution Take 3 mLs (150 mg total) by mouth at bedtime. 100 mL 5  . guaifenesin (ROBITUSSIN) 100 MG/5ML syrup Take by mouth daily.    Marland Kitchen HYDROcodone-homatropine (HYCODAN) 5-1.5 MG/5ML syrup Take 5 mLs by mouth at bedtime as needed for cough. (Patient not taking: Reported on 09/24/2017) 240 mL 0  . meloxicam (MOBIC) 7.5 MG tablet Take 1 tablet (7.5 mg total) by mouth daily. 15 tablet 0  . Menthol-Ascorbic Acid (LUDENS COUGH DROPS MT) Use as directed 1 lozenge in the mouth or throat 3 (three) times daily.     . methylPREDNISolone (MEDROL DOSEPAK) 4 MG TBPK tablet Use as instructed 21 tablet 0  . Multiple Vitamin (MULTIVITAMIN WITH MINERALS) TABS tablet Take 1 tablet by mouth daily.    . naproxen sodium (ALEVE) 220 MG tablet Take 220 mg by mouth daily as needed (headache).    . nystatin (MYCOSTATIN) 100000 UNIT/ML suspension Take 5 mLs by mouth See admin instructions. Swish and spit 5 mls daily  0  . prochlorperazine (COMPAZINE) 10 MG tablet Take 1 tablet (10 mg total) by mouth every 6 (six) hours as needed for nausea or vomiting. 30 tablet 1   No current facility-administered medications for this visit.     SURGICAL HISTORY:  Past Surgical History:  Procedure Laterality Date  . ELBOW SURGERY     for  "Tennis Elbow"  .  hand surgery    . MULTIPLE TOOTH EXTRACTIONS    . TUBAL LIGATION    . VIDEO BRONCHOSCOPY N/A 08/31/2017   Procedure: VIDEO BRONCHOSCOPY;  Surgeon: Melrose Nakayama, MD;  Location: [facility] OR;  Service: Thoracic;  Laterality: N/A;    REVIEW OF SYSTEMS:  A comprehensive review of systems was negative except for: Respiratory: positive for cough Gastrointestinal: positive for constipation   PHYSICAL EXAMINATION: General appearance: alert, cooperative and no distress Head: Normocephalic, without obvious abnormality, atraumatic Neck: no adenopathy, no JVD, supple, symmetrical, trachea  midline and thyroid not enlarged, symmetric, no tenderness/mass/nodules Lymph nodes: Cervical, supraclavicular, and axillary nodes normal. Resp: clear to auscultation bilaterally Back: symmetric, no curvature. ROM normal. No CVA tenderness. Cardio: regular rate and rhythm, S1, S2 normal, no murmur, click, rub or gallop GI: soft, non-tender; bowel sounds normal; no masses,  no organomegaly Extremities: extremities normal, atraumatic, no cyanosis or edema  ECOG PERFORMANCE STATUS: 1 - Symptomatic but completely ambulatory  Blood pressure (!) 101/54, pulse 72, temperature 98.8 F (37.1 C), temperature source Oral, resp. rate 17, height 5' (1.524 m), weight 70 lb 3.2 oz (31.8 kg), SpO2 99 %.  LABORATORY DATA: Lab Results  Component Value Date   WBC 4.1 10/08/2017   HGB 9.5 (L) 10/08/2017   HCT 29.9 (L) 10/08/2017   MCV 82.5 10/08/2017   PLT 420 (H) 10/08/2017      Chemistry      Component Value Date/Time   NA 140 10/01/2017 0743   K 3.2 (L) 10/01/2017 0743   CL 101 10/01/2017 0743   CO2 31 10/01/2017 0743   BUN 11 10/01/2017 0743   CREATININE 0.64 10/01/2017 0743      Component Value Date/Time   CALCIUM 9.8 10/01/2017 0743   ALKPHOS 84 10/01/2017 0743   AST 12 (L) 10/01/2017 0743   ALT 7 10/01/2017 0743   BILITOT 0.3 10/01/2017 0743       RADIOGRAPHIC STUDIES: Dg Chest 2 View  Result Date: 09/16/2017 CLINICAL DATA:  Palpitations, left lower lobe squamous cell lung carcinoma recently diagnosed EXAM: CHEST - 2 VIEW COMPARISON:  09/11/2017 chest radiograph. FINDINGS: Stable cardiomediastinal silhouette with normal heart size. No pneumothorax. Small left pleural effusion is stable. No right pleural effusion. Hyperinflated lungs and emphysema. Known large medial left lower lobe lung mass is stable. No pulmonary edema. Mild patchy left lung base opacity, similar. IMPRESSION: 1. Known medial left lower lobe lung mass appears stable. 2. Mild patchy left lung base opacity appears  stable, compatible with postobstructive pneumonia/atelectasis. 3. Stable small left pleural effusion. 4. Hyperinflated lungs and emphysema, suggesting COPD. Electronically Signed   By: Ilona Sorrel M.D.   On: 09/16/2017 20:01   Dg Chest 2 View  Result Date: 09/11/2017 CLINICAL DATA:  Mediastinal mass. Productive cough. History of COPD. Former smoker. EXAM: CHEST - 2 VIEW COMPARISON:  09/01/2017; 08/31/2017; 06/08/2017; chest CT - 07/13/2017; PET-CT - 08/03/2017 FINDINGS: Grossly unchanged cardiac silhouette and mediastinal contours with partial obscuration of left heart border secondary to known consolidative opacity with the superior segment of the left lower lobe. Grossly unchanged small left and trace right-sided pleural effusions. Interval reduction in persistent tiny right apical pneumothorax. No definite left-sided pneumothorax. No evidence of edema. Improved aeration of left lung base with persistent left basilar opacities, likely atelectasis. The lungs remain hyperexpanded with mild diffuse slightly nodular thickening of the pulmonary interstitium. No new focal airspace opacities. No acute osseus abnormalities. IMPRESSION: 1. Interval reduction in persistent tiny right apical pneumothorax.  2. Improved aeration of lung bases with persistent small left and trace right-sided effusions with associated bibasilar opacities, likely atelectasis. 3. Similar appearance of known left lower lobe pulmonary mass. Electronically Signed   By: Sandi Mariscal M.D.   On: 09/11/2017 08:59   Ct Angio Chest Pe W And/or Wo Contrast  Result Date: 09/16/2017 CLINICAL DATA:  Chest pain and shortness of breath. Recent diagnosis of lung cancer. EXAM: CT ANGIOGRAPHY CHEST WITH CONTRAST TECHNIQUE: Multidetector CT imaging of the chest was performed using the standard protocol during bolus administration of intravenous contrast. Multiplanar CT image reconstructions and MIPs were obtained to evaluate the vascular anatomy. CONTRAST:   20mL ISOVUE-370 IOPAMIDOL (ISOVUE-370) INJECTION 76% COMPARISON:  Chest CT 07/13/2017, PET-CT 08/03/2017 FINDINGS: Cardiovascular: There are no filling defects within the pulmonary arteries to suggest pulmonary embolus. None central left lower lobe lung mass partially encases segmental left lower lobe pulmonary artery with associated narrowing, no evidence of invasion or filling defect. Aortic tortuosity without dissection. Mild aortic atherosclerosis. No significant pericardial effusion. Mediastinum/Nodes: Again seen 12 mm left hilar lymph nodes. Small upper paratracheal and superior mediastinal nodes not enlarged by size criteria. A right hilar node measures 9 mm, unchanged from prior CT and not hypermetabolic on PET. The esophagus is decompressed. Visualized thyroid gland is normal. Lungs/Pleura: Heterogeneous left lower lobe pulmonary mass appears grossly unchanged in size allowing for differences in caliper placement measuring approximately 5.9 x 4.9 x 7 cm. Progressive patchy opacities and nodular in the left lower lobe. Left pleural effusion which is minimally loculated has progressed from prior PET, moderate in size. There is a small right pleural effusion that is new. 7 mm right lower lobe pulmonary nodule image 63 series 6 is unchanged from prior exam. Again seen advanced emphysema. Biapical pleuroparenchymal scarring. Upper Abdomen: No acute findings, paucity of intra-abdominal fat limits assessment. Musculoskeletal: Generalized cachexia. No discrete lytic or blastic osseous lesions. No acute osseous abnormalities. Review of the MIP images confirms the above findings. IMPRESSION: 1. No pulmonary embolus. 2. Grossly unchanged left infrahilar pulmonary mass since PET-CT 5 weeks ago. Increased patchy and nodular opacities in the more distal left lower lobe likely postobstructive pneumonitis. Unchanged size of bilateral hilar lymph nodes, previously not hypermetabolic on PET. 3. Development of partially  loculated moderate left pleural effusion and small right pleural effusion, new. 4. Advanced emphysema. Aortic Atherosclerosis (ICD10-I70.0) and Emphysema (ICD10-J43.9). Electronically Signed   By: Keith Rake M.D.   On: 09/16/2017 23:20   Mr Jeri Cos AT Contrast  Result Date: 09/11/2017 CLINICAL DATA:  72 year old female recently diagnosed with lung cancer. Staging. EXAM: MRI HEAD WITHOUT AND WITH CONTRAST TECHNIQUE: Multiplanar, multiecho pulse sequences of the brain and surrounding structures were obtained without and with intravenous contrast. CONTRAST:  63mL MULTIHANCE GADOBENATE DIMEGLUMINE 529 MG/ML IV SOLN COMPARISON:  PET-CT 08/03/2017. FINDINGS: Brain: No abnormal enhancement identified. No midline shift, mass effect, or evidence of intracranial mass lesion. No dural thickening. No restricted diffusion to suggest acute infarction. No ventriculomegaly, extra-axial collection or acute intracranial hemorrhage. Cervicomedullary junction and pituitary are within normal limits. Pearline Cables and white matter signal is within normal limits for age throughout the brain. Vascular: Major intracranial vascular flow voids are preserved. The major dural venous sinuses are enhancing and appear patent. Skull and upper cervical spine: Visualized bone marrow signal is within normal limits. There are scattered small benign T1 intrinsic hemangiomas of the calvarium (series 11, image 127). Negative visible cervical spine and spinal cord. Sinuses/Orbits: Normal orbits soft tissues.  Paranasal sinuses are clear. Other: Mild mastoid effusions. Negative nasopharynx. Visible internal auditory structures appear normal. Scalp and face soft tissues appear negative. IMPRESSION: 1. No metastatic disease or acute intracranial abnormality. Normal for age MRI appearance of the brain. 2. Mild bilateral mastoid effusions are probably postinflammatory and appear inconsequential. Electronically Signed   By: Genevie Ann M.D.   On: 09/11/2017 13:14     ASSESSMENT AND PLAN: This is a very pleasant 72 years old white female with a stage IIIa non-small cell lung cancer, poorly differentiated squamous cell carcinoma.  She is currently undergoing a course of concurrent chemoradiation with weekly carboplatin and paclitaxel status post 3 cycles.  She has been tolerating this treatment well with no concerning complaints. I recommended for her to proceed with cycle #4 today as scheduled. For constipation I recommended for the patient to use MiraLAX over-the-counter. I will see her back for follow-up visit in 2 weeks for evaluation before starting cycle #6. She was advised to call immediately if she has any concerning symptoms in the interval. The patient voices understanding of current disease status and treatment options and is in agreement with the current care plan.  All questions were answered. The patient knows to call the clinic with any problems, questions or concerns. We can certainly see the patient much sooner if necessary.  I spent 10 minutes counseling the patient face to face. The total time spent in the appointment was 15 minutes.  Disclaimer: This note was dictated with voice recognition software. Similar sounding words can inadvertently be transcribed and may not be corrected upon review.

## 2017-10-08 NOTE — Telephone Encounter (Signed)
Appts already scheduled per 9/30 los - no additional appts added.

## 2017-10-09 ENCOUNTER — Ambulatory Visit
Admission: RE | Admit: 2017-10-09 | Discharge: 2017-10-09 | Disposition: A | Discharge status: Home / Self Care | Payer: PPO | Source: Ambulatory Visit | Attending: Radiation Oncology | Admitting: Radiation Oncology

## 2017-10-09 DIAGNOSIS — Z510 Encounter for antineoplastic radiation therapy: Secondary | ICD-10-CM | POA: Diagnosis not present

## 2017-10-09 DIAGNOSIS — C3432 Malignant neoplasm of lower lobe, left bronchus or lung: Secondary | ICD-10-CM | POA: Diagnosis not present

## 2017-10-09 DIAGNOSIS — M549 Dorsalgia, unspecified: Secondary | ICD-10-CM | POA: Diagnosis not present

## 2017-10-09 DIAGNOSIS — R109 Unspecified abdominal pain: Secondary | ICD-10-CM | POA: Diagnosis not present

## 2017-10-10 ENCOUNTER — Ambulatory Visit
Admission: RE | Admit: 2017-10-10 | Discharge: 2017-10-10 | Disposition: A | Discharge status: Home / Self Care | Payer: PPO | Source: Ambulatory Visit | Attending: Radiation Oncology | Admitting: Radiation Oncology

## 2017-10-10 DIAGNOSIS — C3432 Malignant neoplasm of lower lobe, left bronchus or lung: Secondary | ICD-10-CM | POA: Diagnosis not present

## 2017-10-11 ENCOUNTER — Ambulatory Visit
Admission: RE | Admit: 2017-10-11 | Discharge: 2017-10-11 | Disposition: A | Discharge status: Home / Self Care | Payer: PPO | Source: Ambulatory Visit | Attending: Radiation Oncology | Admitting: Radiation Oncology

## 2017-10-11 DIAGNOSIS — C3432 Malignant neoplasm of lower lobe, left bronchus or lung: Secondary | ICD-10-CM | POA: Diagnosis not present

## 2017-10-12 ENCOUNTER — Ambulatory Visit
Admission: RE | Admit: 2017-10-12 | Discharge: 2017-10-12 | Disposition: A | Discharge status: Home / Self Care | Payer: PPO | Source: Ambulatory Visit | Attending: Radiation Oncology | Admitting: Radiation Oncology

## 2017-10-12 DIAGNOSIS — C3432 Malignant neoplasm of lower lobe, left bronchus or lung: Secondary | ICD-10-CM | POA: Diagnosis not present

## 2017-10-15 ENCOUNTER — Ambulatory Visit
Admission: RE | Admit: 2017-10-15 | Discharge: 2017-10-15 | Disposition: A | Discharge status: Home / Self Care | Payer: PPO | Source: Ambulatory Visit | Attending: Radiation Oncology | Admitting: Radiation Oncology

## 2017-10-15 ENCOUNTER — Inpatient Hospital Stay: Payer: PPO

## 2017-10-15 ENCOUNTER — Inpatient Hospital Stay: Payer: PPO | Attending: Nurse Practitioner

## 2017-10-15 VITALS — BP 113/60 | HR 71 | Temp 97.9°F | Resp 18 | Wt 70.5 lb

## 2017-10-15 DIAGNOSIS — F419 Anxiety disorder, unspecified: Secondary | ICD-10-CM | POA: Diagnosis not present

## 2017-10-15 DIAGNOSIS — J449 Chronic obstructive pulmonary disease, unspecified: Secondary | ICD-10-CM | POA: Insufficient documentation

## 2017-10-15 DIAGNOSIS — C3490 Malignant neoplasm of unspecified part of unspecified bronchus or lung: Secondary | ICD-10-CM

## 2017-10-15 DIAGNOSIS — C3432 Malignant neoplasm of lower lobe, left bronchus or lung: Secondary | ICD-10-CM

## 2017-10-15 DIAGNOSIS — Z5111 Encounter for antineoplastic chemotherapy: Secondary | ICD-10-CM | POA: Diagnosis not present

## 2017-10-15 DIAGNOSIS — F329 Major depressive disorder, single episode, unspecified: Secondary | ICD-10-CM | POA: Diagnosis not present

## 2017-10-15 DIAGNOSIS — Z79899 Other long term (current) drug therapy: Secondary | ICD-10-CM | POA: Insufficient documentation

## 2017-10-15 LAB — CBC WITH DIFFERENTIAL (CANCER CENTER ONLY)
BASOS ABS: 0 10*3/uL (ref 0.0–0.1)
BASOS PCT: 1 %
EOS PCT: 2 %
Eosinophils Absolute: 0.1 10*3/uL (ref 0.0–0.5)
HCT: 31.8 % — ABNORMAL LOW (ref 34.8–46.6)
Hemoglobin: 10 g/dL — ABNORMAL LOW (ref 11.6–15.9)
Lymphocytes Relative: 12 %
Lymphs Abs: 0.4 10*3/uL — ABNORMAL LOW (ref 0.9–3.3)
MCH: 26 pg (ref 25.1–34.0)
MCHC: 31.6 g/dL (ref 31.5–36.0)
MCV: 82.2 fL (ref 79.5–101.0)
MONO ABS: 0.5 10*3/uL (ref 0.1–0.9)
Monocytes Relative: 15 %
Neutro Abs: 2.5 10*3/uL (ref 1.5–6.5)
Neutrophils Relative %: 70 %
PLATELETS: 330 10*3/uL (ref 145–400)
RBC: 3.87 MIL/uL (ref 3.70–5.45)
RDW: 18.2 % — ABNORMAL HIGH (ref 11.2–14.5)
WBC: 3.5 10*3/uL — AB (ref 3.9–10.3)

## 2017-10-15 LAB — CMP (CANCER CENTER ONLY)
ALT: 9 U/L (ref 0–44)
ANION GAP: 9 (ref 5–15)
AST: 14 U/L — AB (ref 15–41)
Albumin: 2.8 g/dL — ABNORMAL LOW (ref 3.5–5.0)
Alkaline Phosphatase: 96 U/L (ref 38–126)
BUN: 10 mg/dL (ref 8–23)
CALCIUM: 8.8 mg/dL — AB (ref 8.9–10.3)
CHLORIDE: 106 mmol/L (ref 98–111)
CO2: 28 mmol/L (ref 22–32)
CREATININE: 0.62 mg/dL (ref 0.44–1.00)
Glucose, Bld: 81 mg/dL (ref 70–99)
Potassium: 3.6 mmol/L (ref 3.5–5.1)
Sodium: 143 mmol/L (ref 135–145)
Total Bilirubin: 0.3 mg/dL (ref 0.3–1.2)
Total Protein: 6.4 g/dL — ABNORMAL LOW (ref 6.5–8.1)

## 2017-10-15 MED ORDER — SODIUM CHLORIDE 0.9 % IV SOLN
102.2000 mg | Freq: Once | INTRAVENOUS | Status: AC
  Administered 2017-10-15: 100 mg via INTRAVENOUS
  Filled 2017-10-15: qty 10

## 2017-10-15 MED ORDER — SODIUM CHLORIDE 0.9 % IV SOLN
20.0000 mg | Freq: Once | INTRAVENOUS | Status: AC
  Administered 2017-10-15: 20 mg via INTRAVENOUS
  Filled 2017-10-15: qty 2

## 2017-10-15 MED ORDER — FAMOTIDINE IN NACL 20-0.9 MG/50ML-% IV SOLN
20.0000 mg | Freq: Once | INTRAVENOUS | Status: AC
  Administered 2017-10-15: 20 mg via INTRAVENOUS

## 2017-10-15 MED ORDER — PALONOSETRON HCL INJECTION 0.25 MG/5ML
0.2500 mg | Freq: Once | INTRAVENOUS | Status: AC
  Administered 2017-10-15: 0.25 mg via INTRAVENOUS

## 2017-10-15 MED ORDER — SODIUM CHLORIDE 0.9 % IV SOLN
Freq: Once | INTRAVENOUS | Status: AC
  Administered 2017-10-15: 10:00:00 via INTRAVENOUS
  Filled 2017-10-15: qty 250

## 2017-10-15 MED ORDER — FAMOTIDINE IN NACL 20-0.9 MG/50ML-% IV SOLN
INTRAVENOUS | Status: AC
  Filled 2017-10-15: qty 50

## 2017-10-15 MED ORDER — DIPHENHYDRAMINE HCL 50 MG/ML IJ SOLN
INTRAMUSCULAR | Status: AC
  Filled 2017-10-15: qty 1

## 2017-10-15 MED ORDER — PALONOSETRON HCL INJECTION 0.25 MG/5ML
INTRAVENOUS | Status: AC
  Filled 2017-10-15: qty 5

## 2017-10-15 MED ORDER — DIPHENHYDRAMINE HCL 50 MG/ML IJ SOLN
50.0000 mg | Freq: Once | INTRAMUSCULAR | Status: AC
  Administered 2017-10-15: 50 mg via INTRAVENOUS

## 2017-10-15 MED ORDER — SODIUM CHLORIDE 0.9 % IV SOLN
45.0000 mg/m2 | Freq: Once | INTRAVENOUS | Status: AC
  Administered 2017-10-15: 54 mg via INTRAVENOUS
  Filled 2017-10-15: qty 9

## 2017-10-15 NOTE — Patient Instructions (Signed)
[facility] Discharge Instructions for Patients Receiving Chemotherapy  Today you received the following chemotherapy agents Taxol, Carboplatin  To help prevent nausea and vomiting after your treatment, we encourage you to take your nausea medication as directed  If you develop nausea and vomiting that is not controlled by your nausea medication, call the clinic.   BELOW ARE SYMPTOMS THAT SHOULD BE REPORTED IMMEDIATELY:  *FEVER GREATER THAN 100.5 F  *CHILLS WITH OR WITHOUT FEVER  NAUSEA AND VOMITING THAT IS NOT CONTROLLED WITH YOUR NAUSEA MEDICATION  *UNUSUAL SHORTNESS OF BREATH  *UNUSUAL BRUISING OR BLEEDING  TENDERNESS IN MOUTH AND THROAT WITH OR WITHOUT PRESENCE OF ULCERS  *URINARY PROBLEMS  *BOWEL PROBLEMS  UNUSUAL RASH Items with * indicate a potential emergency and should be followed up as soon as possible.  Feel free to call the clinic should you have any questions or concerns. The clinic phone number is [phone].  Please show the CHEMO ALERT CARD at check-in to the Emergency Department and triage nurse.   

## 2017-10-16 ENCOUNTER — Ambulatory Visit
Admission: RE | Admit: 2017-10-16 | Discharge: 2017-10-16 | Disposition: A | Discharge status: Home / Self Care | Payer: PPO | Source: Ambulatory Visit | Attending: Radiation Oncology | Admitting: Radiation Oncology

## 2017-10-16 DIAGNOSIS — C3432 Malignant neoplasm of lower lobe, left bronchus or lung: Secondary | ICD-10-CM | POA: Diagnosis not present

## 2017-10-17 ENCOUNTER — Ambulatory Visit
Admission: RE | Admit: 2017-10-17 | Discharge: 2017-10-17 | Disposition: A | Discharge status: Home / Self Care | Payer: PPO | Source: Ambulatory Visit | Attending: Radiation Oncology | Admitting: Radiation Oncology

## 2017-10-17 DIAGNOSIS — C3432 Malignant neoplasm of lower lobe, left bronchus or lung: Secondary | ICD-10-CM | POA: Diagnosis not present

## 2017-10-17 MED ORDER — SONAFINE EX EMUL: 1.0000 "application " | Freq: Two times a day (BID) | CUTANEOUS | Status: DC

## 2017-10-17 MED ORDER — SONAFINE EX EMUL
1.0000 "application " | Freq: Two times a day (BID) | CUTANEOUS | Status: DC
  Administered 2017-10-17: 1 via TOPICAL

## 2017-10-18 ENCOUNTER — Ambulatory Visit
Admission: RE | Admit: 2017-10-18 | Discharge: 2017-10-18 | Disposition: A | Discharge status: Home / Self Care | Payer: PPO | Source: Ambulatory Visit | Attending: Radiation Oncology | Admitting: Radiation Oncology

## 2017-10-18 DIAGNOSIS — C3432 Malignant neoplasm of lower lobe, left bronchus or lung: Secondary | ICD-10-CM | POA: Diagnosis not present

## 2017-10-19 ENCOUNTER — Ambulatory Visit
Admission: RE | Admit: 2017-10-19 | Discharge: 2017-10-19 | Disposition: A | Discharge status: Home / Self Care | Payer: PPO | Source: Ambulatory Visit | Attending: Radiation Oncology | Admitting: Radiation Oncology

## 2017-10-19 DIAGNOSIS — C3432 Malignant neoplasm of lower lobe, left bronchus or lung: Secondary | ICD-10-CM | POA: Diagnosis not present

## 2017-10-22 ENCOUNTER — Inpatient Hospital Stay: Payer: PPO

## 2017-10-22 ENCOUNTER — Encounter: Payer: Self-pay | Admitting: Internal Medicine

## 2017-10-22 ENCOUNTER — Other Ambulatory Visit: Payer: PPO

## 2017-10-22 ENCOUNTER — Encounter: Payer: Self-pay | Admitting: Oncology

## 2017-10-22 ENCOUNTER — Ambulatory Visit
Admission: RE | Admit: 2017-10-22 | Discharge: 2017-10-22 | Disposition: A | Discharge status: Home / Self Care | Payer: PPO | Source: Ambulatory Visit | Attending: Radiation Oncology | Admitting: Radiation Oncology

## 2017-10-22 ENCOUNTER — Inpatient Hospital Stay (HOSPITAL_BASED_OUTPATIENT_CLINIC_OR_DEPARTMENT_OTHER): Payer: PPO | Admitting: Oncology

## 2017-10-22 ENCOUNTER — Ambulatory Visit: Payer: PPO

## 2017-10-22 VITALS — BP 126/62 | HR 71 | Temp 97.9°F | Resp 17 | Ht 60.0 in | Wt 72.1 lb

## 2017-10-22 DIAGNOSIS — C3432 Malignant neoplasm of lower lobe, left bronchus or lung: Secondary | ICD-10-CM | POA: Diagnosis not present

## 2017-10-22 DIAGNOSIS — Z5111 Encounter for antineoplastic chemotherapy: Secondary | ICD-10-CM

## 2017-10-22 DIAGNOSIS — L03115 Cellulitis of right lower limb: Secondary | ICD-10-CM

## 2017-10-22 DIAGNOSIS — K130 Diseases of lips: Secondary | ICD-10-CM | POA: Insufficient documentation

## 2017-10-22 DIAGNOSIS — C3490 Malignant neoplasm of unspecified part of unspecified bronchus or lung: Secondary | ICD-10-CM

## 2017-10-22 LAB — CBC WITH DIFFERENTIAL (CANCER CENTER ONLY)
Abs Immature Granulocytes: 0.02 10*3/uL (ref 0.00–0.07)
BASOS ABS: 0 10*3/uL (ref 0.0–0.1)
Basophils Relative: 1 %
Eosinophils Absolute: 0.1 10*3/uL (ref 0.0–0.5)
Eosinophils Relative: 1 %
HCT: 32 % — ABNORMAL LOW (ref 36.0–46.0)
HEMOGLOBIN: 9.8 g/dL — AB (ref 12.0–15.0)
Immature Granulocytes: 1 %
Lymphocytes Relative: 16 %
Lymphs Abs: 0.6 10*3/uL — ABNORMAL LOW (ref 0.7–4.0)
MCH: 26.3 pg (ref 26.0–34.0)
MCHC: 30.6 g/dL (ref 30.0–36.0)
MCV: 86 fL (ref 80.0–100.0)
Monocytes Absolute: 0.5 10*3/uL (ref 0.1–1.0)
Monocytes Relative: 13 %
NEUTROS ABS: 2.4 10*3/uL (ref 1.7–7.7)
NEUTROS PCT: 68 %
Platelet Count: 296 10*3/uL (ref 150–400)
RBC: 3.72 MIL/uL — AB (ref 3.87–5.11)
RDW: 20 % — ABNORMAL HIGH (ref 11.5–15.5)
WBC: 3.6 10*3/uL — AB (ref 4.0–10.5)
nRBC: 0 % (ref 0.0–0.2)

## 2017-10-22 LAB — COMPREHENSIVE METABOLIC PANEL
ALBUMIN: 2.9 g/dL — AB (ref 3.5–5.0)
ALK PHOS: 101 U/L (ref 38–126)
ALT: 12 U/L (ref 0–44)
AST: 19 U/L (ref 15–41)
Anion gap: 9 (ref 5–15)
BUN: 12 mg/dL (ref 8–23)
CALCIUM: 9 mg/dL (ref 8.9–10.3)
CO2: 29 mmol/L (ref 22–32)
Chloride: 104 mmol/L (ref 98–111)
Creatinine, Ser: 0.61 mg/dL (ref 0.44–1.00)
GFR calc Af Amer: >60 mL/min (ref >60)
GFR calc non Af Amer: >60 mL/min (ref >60)
GLUCOSE: 88 mg/dL (ref 70–99)
POTASSIUM: 3.8 mmol/L (ref 3.5–5.1)
SODIUM: 142 mmol/L (ref 135–145)
Total Bilirubin: 0.3 mg/dL (ref 0.3–1.2)
Total Protein: 6.3 g/dL — ABNORMAL LOW (ref 6.5–8.1)

## 2017-10-22 MED ORDER — DIPHENHYDRAMINE HCL 50 MG/ML IJ SOLN
50.0000 mg | Freq: Once | INTRAMUSCULAR | Status: AC
  Administered 2017-10-22: 50 mg via INTRAVENOUS

## 2017-10-22 MED ORDER — NYSTATIN 100000 UNIT/GM EX CREA: 1.0000 "application " | TOPICAL_CREAM | Freq: Two times a day (BID) | CUTANEOUS | 0 refills | Status: DC | PRN

## 2017-10-22 MED ORDER — FAMOTIDINE IN NACL 20-0.9 MG/50ML-% IV SOLN
20.0000 mg | Freq: Once | INTRAVENOUS | Status: AC
  Administered 2017-10-22: 20 mg via INTRAVENOUS

## 2017-10-22 MED ORDER — PALONOSETRON HCL INJECTION 0.25 MG/5ML
0.2500 mg | Freq: Once | INTRAVENOUS | Status: AC
  Administered 2017-10-22: 0.25 mg via INTRAVENOUS

## 2017-10-22 MED ORDER — DOXYCYCLINE HYCLATE 100 MG PO TABS: 100.0000 mg | ORAL_TABLET | Freq: Two times a day (BID) | ORAL | 0 refills | Status: DC

## 2017-10-22 MED ORDER — PALONOSETRON HCL INJECTION 0.25 MG/5ML
INTRAVENOUS | Status: AC
  Filled 2017-10-22: qty 5

## 2017-10-22 MED ORDER — SODIUM CHLORIDE 0.9 % IV SOLN
102.2000 mg | Freq: Once | INTRAVENOUS | Status: AC
  Administered 2017-10-22: 100 mg via INTRAVENOUS
  Filled 2017-10-22: qty 10

## 2017-10-22 MED ORDER — SODIUM CHLORIDE 0.9 % IV SOLN
45.0000 mg/m2 | Freq: Once | INTRAVENOUS | Status: AC
  Administered 2017-10-22: 54 mg via INTRAVENOUS
  Filled 2017-10-22: qty 9

## 2017-10-22 MED ORDER — DEXAMETHASONE SODIUM PHOSPHATE 10 MG/ML IJ SOLN
INTRAMUSCULAR | Status: AC
  Filled 2017-10-22: qty 1

## 2017-10-22 MED ORDER — SODIUM CHLORIDE 0.9 % IV SOLN
Freq: Once | INTRAVENOUS | Status: AC
  Administered 2017-10-22: 14:00:00 via INTRAVENOUS
  Filled 2017-10-22: qty 250

## 2017-10-22 MED ORDER — DIPHENHYDRAMINE HCL 50 MG/ML IJ SOLN
INTRAMUSCULAR | Status: AC
  Filled 2017-10-22: qty 1

## 2017-10-22 MED ORDER — FAMOTIDINE IN NACL 20-0.9 MG/50ML-% IV SOLN
INTRAVENOUS | Status: AC
  Filled 2017-10-22: qty 50

## 2017-10-22 MED ORDER — SODIUM CHLORIDE 0.9 % IV SOLN
20.0000 mg | Freq: Once | INTRAVENOUS | Status: AC
  Administered 2017-10-22: 20 mg via INTRAVENOUS
  Filled 2017-10-22: qty 2

## 2017-10-22 NOTE — Assessment & Plan Note (Signed)
This is a very pleasant 72 year old white female with a stage IIIa non-small cell lung cancer, poorly differentiated squamous cell carcinoma.  She is currently undergoing a course of concurrent chemoradiation with weekly carboplatin and paclitaxel status post 5 cycles.  She has been tolerating this treatment well with no concerning complaints. I recommended for her to proceed with cycle #6 today as scheduled.  She will follow-up next week for labs and cycle 7 of her chemotherapy.  I have scheduled her for a restaging CT scan of the chest to be done approximately 3 weeks following the end of her radiation.  She will follow-up in our office 1 to 2 days after the CT scan to discuss the results.  For her angular cheilitis I have given her prescription for nystatin cream to use twice a day as needed.  For her right knee, I have given her prescription for doxycycline 100 mg twice a day for 7 days for possible early cellulitis.  She was advised to call immediately if she has any concerning symptoms in the interval. The patient voices understanding of current disease status and treatment options and is in agreement with the current care plan.  All questions were answered. The patient knows to call the clinic with any problems, questions or concerns. We can certainly see the patient much sooner if necessary.

## 2017-10-22 NOTE — Progress Notes (Signed)
Went to infusion to introduce myself to patient as Arboriculturist and to ask about financial questions or concerns regarding her treatment.There are no funds available for her diagnosis.  Patient states she needs help with paying her bills. Gave patient my card to call to discuss available resources due to her not hearing well and not wanting to disturb patients around. Advised her I will contact her as well to discuss(one-time $500 [place]). She verbalized understanding.

## 2017-10-22 NOTE — Patient Instructions (Signed)
   [facility] Discharge Instructions for Patients Receiving Chemotherapy  Today you received the following chemotherapy agents Taxol and Carboplatin   To help prevent nausea and vomiting after your treatment, we encourage you to take your nausea medication as directed.    If you develop nausea and vomiting that is not controlled by your nausea medication, call the clinic.   BELOW ARE SYMPTOMS THAT SHOULD BE REPORTED IMMEDIATELY:  *FEVER GREATER THAN 100.5 F  *CHILLS WITH OR WITHOUT FEVER  NAUSEA AND VOMITING THAT IS NOT CONTROLLED WITH YOUR NAUSEA MEDICATION  *UNUSUAL SHORTNESS OF BREATH  *UNUSUAL BRUISING OR BLEEDING  TENDERNESS IN MOUTH AND THROAT WITH OR WITHOUT PRESENCE OF ULCERS  *URINARY PROBLEMS  *BOWEL PROBLEMS  UNUSUAL RASH Items with * indicate a potential emergency and should be followed up as soon as possible.  Feel free to call the clinic should you have any questions or concerns. The clinic phone number is [phone].  Please show the CHEMO ALERT CARD at check-in to the Emergency Department and triage nurse.   

## 2017-10-22 NOTE — Progress Notes (Signed)
[facility] OFFICE PROGRESS NOTE  [name], NP [place] [address]  DIAGNOSIS: Stage IIb/IIIa (T3, N0/N1, M0)non-small cell lung cancer poorly differentiated squamous cell carcinoma diagnosed in August 2019. The patient presented with large superior segment leftlower lobe mass with questionable left hilar adenopathy.  PRIOR THERAPY: None.  CURRENT THERAPY:Concurrent chemoradiation with weekly carboplatin for AUC of 2 and paclitaxel 45 mg/M2.  Status post 5 cycles.  INTERVAL HISTORY: Sherry [name] 72 y.o. female returns for routine follow-up visit by herself.  The patient is feeling fine today with no specific complaints except for right knee discomfort.  She reports that she bumped this area recently and is having some swelling and mild redness to this area.  She denies fevers and chills.  Denies chest pain, shortness of breath, cough, hemoptysis.  Denies nausea, vomiting, constipation, diarrhea.  Denies odynophagia.  Denies recent weight loss or night sweats.  The patient is here for evaluation prior to cycle #6 of her treatment.  MEDICAL HISTORY: Past Medical History:  Diagnosis Date  . Allergy   . Anxiety   . Arthritis   . Cavitating mass in left lower lung lobe   . Complication of anesthesia    woke up during procedure  . COPD (chronic obstructive pulmonary disease) ([place])   . Depression   . Family history of adverse reaction to anesthesia    " my mother was always hard to wake up"  . Full dentures   . Headache   . Neuromuscular disorder ([place])   . Pneumonia   . Wears glasses     ALLERGIES:  is allergic to penicillins.  MEDICATIONS:  Current Outpatient Medications  Medication Sig Dispense Refill  . cyanocobalamin (,VITAMIN B-12,) 1000 MCG/ML injection Inject 1,000 mcg into the muscle every 30 (thirty) days. Administered by Dr. Eldridge Abrahams    . doxycycline (VIBRA-TABS) 100 MG tablet Take 1 tablet (100 mg total) by mouth  2 (two) times daily. 14 tablet 0  . ENSURE (ENSURE) Take 237 mLs by mouth daily.    Marland Kitchen gabapentin (NEURONTIN) 250 MG/5ML solution Take 3 mLs (150 mg total) by mouth at bedtime. 100 mL 5  . guaifenesin (ROBITUSSIN) 100 MG/5ML syrup Take by mouth daily.    . meloxicam (MOBIC) 7.5 MG tablet Take 1 tablet (7.5 mg total) by mouth daily. 15 tablet 0  . Menthol-Ascorbic Acid (LUDENS COUGH DROPS MT) Use as directed 1 lozenge in the mouth or throat 3 (three) times daily.     . methylPREDNISolone (MEDROL DOSEPAK) 4 MG TBPK tablet Use as instructed 21 tablet 0  . Multiple Vitamin (MULTIVITAMIN WITH MINERALS) TABS tablet Take 1 tablet by mouth daily.    . naproxen sodium (ALEVE) 220 MG tablet Take 220 mg by mouth daily as needed (headache).    . nystatin (MYCOSTATIN) 100000 UNIT/ML suspension Take 5 mLs by mouth See admin instructions. Swish and spit 5 mls daily  0  . nystatin cream (MYCOSTATIN) Apply 1 application topically 2 (two) times daily as needed for dry skin. 15 g 0  . prochlorperazine (COMPAZINE) 10 MG tablet Take 1 tablet (10 mg total) by mouth every 6 (six) hours as needed for nausea or vomiting. 30 tablet 1   No current facility-administered medications for this visit.    Facility-Administered Medications Ordered in Other Visits  Medication Dose Route Frequency Provider Last Rate Last Dose  . CARBOplatin (PARAPLATIN) 100 mg in sodium chloride 0.9 % 100 mL  chemo infusion  100 mg Intravenous Once Curt Bears, MD      . dexamethasone (DECADRON) 20 mg in sodium chloride 0.9 % 50 mL IVPB  20 mg Intravenous Once Curt Bears, MD      . diphenhydrAMINE (BENADRYL) injection 50 mg  50 mg Intravenous Once Curt Bears, MD      . famotidine (PEPCID) IVPB 20 mg premix  20 mg Intravenous Once Curt Bears, MD      . PACLitaxel (TAXOL) 54 mg in sodium chloride 0.9 % 150 mL chemo infusion (</= 80mg /m2)  45 mg/m2 (Treatment Plan Recorded) Intravenous Once Curt Bears, MD      .  palonosetron (ALOXI) injection 0.25 mg  0.25 mg Intravenous Once Curt Bears, MD        SURGICAL HISTORY:  Past Surgical History:  Procedure Laterality Date  . ELBOW SURGERY     for  "Tennis Elbow"  . hand surgery    . MULTIPLE TOOTH EXTRACTIONS    . TUBAL LIGATION    . VIDEO BRONCHOSCOPY N/A 08/31/2017   Procedure: VIDEO BRONCHOSCOPY;  Surgeon: Melrose Nakayama, MD;  Location: [facility] OR;  Service: Thoracic;  Laterality: N/A;    REVIEW OF SYSTEMS:   Review of Systems  Constitutional: Negative for appetite change, chills, fatigue, fever and unexpected weight change.  HENT:   Negative for mouth sores, nosebleeds, sore throat and trouble swallowing.   Eyes: Negative for eye problems and icterus.  Respiratory: Negative for cough, hemoptysis, shortness of breath and wheezing.   Cardiovascular: Negative for chest pain and leg swelling.  Gastrointestinal: Negative for abdominal pain, constipation, diarrhea, nausea and vomiting.  Genitourinary: Negative for bladder incontinence, difficulty urinating, dysuria, frequency and hematuria.   Musculoskeletal: Negative for back pain, gait problem, neck pain and neck stiffness.  Positive for right knee pain. Skin: Negative for itching and rash.  Neurological: Negative for dizziness, extremity weakness, gait problem, headaches, light-headedness and seizures.  Hematological: Negative for adenopathy. Does not bruise/bleed easily.  Psychiatric/Behavioral: Negative for confusion, depression and sleep disturbance. The patient is not nervous/anxious.     PHYSICAL EXAMINATION:  Blood pressure 126/62, pulse 71, temperature 97.9 F (36.6 C), temperature source Oral, resp. rate 17, height 5' (1.524 m), weight 72 lb 1.6 oz (32.7 kg), SpO2 99 %.  ECOG PERFORMANCE STATUS: 1 - Symptomatic but completely ambulatory  Physical Exam  Constitutional: Oriented to person, place, and time. No distress.  HENT:  Head: Normocephalic and atraumatic.  Mouth/Throat:  Oropharynx is clear and moist. No oropharyngeal exudate.  Angular cheilitis noted. Eyes: Conjunctivae are normal. Right eye exhibits no discharge. Left eye exhibits no discharge. No scleral icterus.  Neck: Normal range of motion. Neck supple.  Cardiovascular: Normal rate, regular rhythm, normal heart sounds and intact distal pulses.   Pulmonary/Chest: Effort normal and breath sounds normal. No respiratory distress. No wheezes. No rales.  Abdominal: Soft. Bowel sounds are normal. Exhibits no distension and no mass. There is no tenderness.  Musculoskeletal: Normal range of motion.  Right knee with mild erythema and small effusion. Lymphadenopathy:    No cervical adenopathy.  Neurological: Alert and oriented to person, place, and time. Exhibits normal muscle tone. Gait normal. Coordination normal.  Skin: Skin is warm and dry. No rash noted. Not diaphoretic. No erythema. No pallor.  Psychiatric: Mood, memory and judgment normal.  Vitals reviewed.  LABORATORY DATA: Lab Results  Component Value Date   WBC 3.6 (L) 10/22/2017   HGB 9.8 (L) 10/22/2017   HCT 32.0 (L)  10/22/2017   MCV 86.0 10/22/2017   PLT 296 10/22/2017      Chemistry      Component Value Date/Time   NA 142 10/22/2017 1203   K 3.8 10/22/2017 1203   CL 104 10/22/2017 1203   CO2 29 10/22/2017 1203   BUN 12 10/22/2017 1203   CREATININE 0.61 10/22/2017 1203   CREATININE 0.62 10/15/2017 0748      Component Value Date/Time   CALCIUM 9.0 10/22/2017 1203   ALKPHOS 101 10/22/2017 1203   AST 19 10/22/2017 1203   AST 14 (L) 10/15/2017 0748   ALT 12 10/22/2017 1203   ALT 9 10/15/2017 0748   BILITOT 0.3 10/22/2017 1203   BILITOT 0.3 10/15/2017 0748       RADIOGRAPHIC STUDIES:  No results found.   ASSESSMENT/PLAN:  Primary malignant neoplasm of bronchus of left lower lobe [facility]) This is a very pleasant 72 year old white female with a stage IIIa non-small cell lung cancer, poorly differentiated squamous cell carcinoma.   She is currently undergoing a course of concurrent chemoradiation with weekly carboplatin and paclitaxel status post 5 cycles.  She has been tolerating this treatment well with no concerning complaints. I recommended for her to proceed with cycle #6 today as scheduled.  She will follow-up next week for labs and cycle 7 of her chemotherapy.  I have scheduled her for a restaging CT scan of the chest to be done approximately 3 weeks following the end of her radiation.  She will follow-up in our office 1 to 2 days after the CT scan to discuss the results.  For her angular cheilitis I have given her prescription for nystatin cream to use twice a day as needed.  For her right knee, I have given her prescription for doxycycline 100 mg twice a day for 7 days for possible early cellulitis.  She was advised to call immediately if she has any concerning symptoms in the interval. The patient voices understanding of current disease status and treatment options and is in agreement with the current care plan.  All questions were answered. The patient knows to call the clinic with any problems, questions or concerns. We can certainly see the patient much sooner if necessary.   Orders Placed This Encounter  Procedures  . CT CHEST W CONTRAST    Standing Status:   Future    Standing Expiration Date:   10/23/2018    Order Specific Question:   If indicated for the ordered procedure, I authorize the administration of contrast media per Radiology protocol    Answer:   Yes    Order Specific Question:   Preferred imaging location?    Answer:   [facility]    Order Specific Question:   Radiology Contrast Protocol - do NOT remove file path    Answer:   [email or web address]    Order Specific Question:   ** REASON FOR EXAM (FREE TEXT)    Answer:   Lung cancer. Restaging.     Mikey Bussing, DNP, AGPCNP-BC, AOCNP 10/22/17

## 2017-10-23 ENCOUNTER — Telehealth: Payer: Self-pay | Admitting: *Deleted

## 2017-10-23 ENCOUNTER — Telehealth: Payer: Self-pay | Admitting: Internal Medicine

## 2017-10-23 ENCOUNTER — Ambulatory Visit
Admission: RE | Admit: 2017-10-23 | Discharge: 2017-10-23 | Disposition: A | Discharge status: Home / Self Care | Payer: PPO | Source: Ambulatory Visit | Attending: Radiation Oncology | Admitting: Radiation Oncology

## 2017-10-23 DIAGNOSIS — C3432 Malignant neoplasm of lower lobe, left bronchus or lung: Secondary | ICD-10-CM | POA: Diagnosis not present

## 2017-10-23 NOTE — Telephone Encounter (Signed)
F/U appt added and LMVM for patient with date/time per 10/14 staff message

## 2017-10-23 NOTE — Telephone Encounter (Signed)
Oncology Nurse Navigator Documentation  Oncology Nurse Navigator Flowsheets 10/23/2017  Navigator Location [facility]  Navigator Encounter Type Telephone;Other/I received information from infusion nurse that patient would like information on wigs.  I called Ms. Clutter today and left vm message with information and will mail her brochures.  I also left my name and phone number to call if needed.   Telephone Outgoing Call  Treatment Phase Treatment  Barriers/Navigation Needs Education  Education Other  Interventions Education;Other  Education Method Verbal;Written  Acuity Level 2  Time Spent with Patient 30

## 2017-10-24 ENCOUNTER — Ambulatory Visit
Admission: RE | Admit: 2017-10-24 | Discharge: 2017-10-24 | Disposition: A | Discharge status: Home / Self Care | Payer: PPO | Source: Ambulatory Visit | Attending: Radiation Oncology | Admitting: Radiation Oncology

## 2017-10-24 DIAGNOSIS — C3432 Malignant neoplasm of lower lobe, left bronchus or lung: Secondary | ICD-10-CM | POA: Diagnosis not present

## 2017-10-25 ENCOUNTER — Ambulatory Visit
Admission: RE | Admit: 2017-10-25 | Discharge: 2017-10-25 | Disposition: A | Discharge status: Home / Self Care | Payer: PPO | Source: Ambulatory Visit | Attending: Radiation Oncology | Admitting: Radiation Oncology

## 2017-10-25 DIAGNOSIS — C3432 Malignant neoplasm of lower lobe, left bronchus or lung: Secondary | ICD-10-CM | POA: Diagnosis not present

## 2017-10-26 ENCOUNTER — Ambulatory Visit
Admission: RE | Admit: 2017-10-26 | Discharge: 2017-10-26 | Disposition: A | Discharge status: Home / Self Care | Payer: PPO | Source: Ambulatory Visit | Attending: Radiation Oncology | Admitting: Radiation Oncology

## 2017-10-26 DIAGNOSIS — C3432 Malignant neoplasm of lower lobe, left bronchus or lung: Secondary | ICD-10-CM | POA: Diagnosis not present

## 2017-10-29 ENCOUNTER — Ambulatory Visit
Admission: RE | Admit: 2017-10-29 | Discharge: 2017-10-29 | Disposition: A | Discharge status: Home / Self Care | Payer: PPO | Source: Ambulatory Visit | Attending: Radiation Oncology | Admitting: Radiation Oncology

## 2017-10-29 ENCOUNTER — Inpatient Hospital Stay: Payer: PPO

## 2017-10-29 ENCOUNTER — Telehealth: Payer: Self-pay | Admitting: Medical

## 2017-10-29 ENCOUNTER — Inpatient Hospital Stay: Payer: PPO | Admitting: Nutrition

## 2017-10-29 ENCOUNTER — Inpatient Hospital Stay (HOSPITAL_BASED_OUTPATIENT_CLINIC_OR_DEPARTMENT_OTHER): Payer: PPO | Admitting: Medical

## 2017-10-29 ENCOUNTER — Encounter: Payer: Self-pay | Admitting: *Deleted

## 2017-10-29 ENCOUNTER — Other Ambulatory Visit: Payer: Self-pay | Admitting: Medical

## 2017-10-29 ENCOUNTER — Other Ambulatory Visit: Payer: Self-pay | Admitting: *Deleted

## 2017-10-29 VITALS — BP 136/58 | HR 71 | Temp 97.9°F | Resp 19

## 2017-10-29 DIAGNOSIS — M62830 Muscle spasm of back: Secondary | ICD-10-CM

## 2017-10-29 DIAGNOSIS — R1031 Right lower quadrant pain: Secondary | ICD-10-CM | POA: Insufficient documentation

## 2017-10-29 DIAGNOSIS — Z5111 Encounter for antineoplastic chemotherapy: Secondary | ICD-10-CM | POA: Diagnosis not present

## 2017-10-29 DIAGNOSIS — M549 Dorsalgia, unspecified: Secondary | ICD-10-CM | POA: Diagnosis not present

## 2017-10-29 DIAGNOSIS — J449 Chronic obstructive pulmonary disease, unspecified: Secondary | ICD-10-CM | POA: Insufficient documentation

## 2017-10-29 DIAGNOSIS — Z79899 Other long term (current) drug therapy: Secondary | ICD-10-CM

## 2017-10-29 DIAGNOSIS — C3432 Malignant neoplasm of lower lobe, left bronchus or lung: Secondary | ICD-10-CM

## 2017-10-29 DIAGNOSIS — C3490 Malignant neoplasm of unspecified part of unspecified bronchus or lung: Secondary | ICD-10-CM

## 2017-10-29 DIAGNOSIS — F329 Major depressive disorder, single episode, unspecified: Secondary | ICD-10-CM | POA: Insufficient documentation

## 2017-10-29 DIAGNOSIS — Z87891 Personal history of nicotine dependence: Secondary | ICD-10-CM | POA: Insufficient documentation

## 2017-10-29 DIAGNOSIS — M7918 Myalgia, other site: Secondary | ICD-10-CM | POA: Insufficient documentation

## 2017-10-29 DIAGNOSIS — F419 Anxiety disorder, unspecified: Secondary | ICD-10-CM

## 2017-10-29 DIAGNOSIS — R109 Unspecified abdominal pain: Secondary | ICD-10-CM | POA: Diagnosis not present

## 2017-10-29 LAB — CBC WITH DIFFERENTIAL (CANCER CENTER ONLY)
ABS IMMATURE GRANULOCYTES: 0.02 10*3/uL (ref 0.00–0.07)
BASOS ABS: 0 10*3/uL (ref 0.0–0.1)
BASOS PCT: 1 %
Eosinophils Absolute: 0 10*3/uL (ref 0.0–0.5)
Eosinophils Relative: 2 %
HCT: 33.2 % — ABNORMAL LOW (ref 36.0–46.0)
HEMOGLOBIN: 10.1 g/dL — AB (ref 12.0–15.0)
IMMATURE GRANULOCYTES: 1 %
LYMPHS PCT: 24 %
Lymphs Abs: 0.6 10*3/uL — ABNORMAL LOW (ref 0.7–4.0)
MCH: 26.7 pg (ref 26.0–34.0)
MCHC: 30.4 g/dL (ref 30.0–36.0)
MCV: 87.8 fL (ref 80.0–100.0)
Monocytes Absolute: 0.3 10*3/uL (ref 0.1–1.0)
Monocytes Relative: 13 %
NEUTROS ABS: 1.5 10*3/uL — AB (ref 1.7–7.7)
NEUTROS PCT: 59 %
PLATELETS: 286 10*3/uL (ref 150–400)
RBC: 3.78 MIL/uL — ABNORMAL LOW (ref 3.87–5.11)
RDW: 21.2 % — ABNORMAL HIGH (ref 11.5–15.5)
WBC: 2.5 10*3/uL — AB (ref 4.0–10.5)
nRBC: 0 % (ref 0.0–0.2)

## 2017-10-29 LAB — CMP (CANCER CENTER ONLY)
ALBUMIN: 2.9 g/dL — AB (ref 3.5–5.0)
ALT: 12 U/L (ref 0–44)
AST: 22 U/L (ref 15–41)
Alkaline Phosphatase: 106 U/L (ref 38–126)
Anion gap: 8 (ref 5–15)
BUN: 12 mg/dL (ref 8–23)
CHLORIDE: 105 mmol/L (ref 98–111)
CO2: 28 mmol/L (ref 22–32)
CREATININE: 0.62 mg/dL (ref 0.44–1.00)
Calcium: 8.9 mg/dL (ref 8.9–10.3)
GFR, Estimated: >60 mL/min (ref >60)
Glucose, Bld: 80 mg/dL (ref 70–99)
Potassium: 3.7 mmol/L (ref 3.5–5.1)
Sodium: 141 mmol/L (ref 135–145)
Total Bilirubin: 0.3 mg/dL (ref 0.3–1.2)
Total Protein: 6.4 g/dL — ABNORMAL LOW (ref 6.5–8.1)

## 2017-10-29 MED ORDER — METHOCARBAMOL 500 MG PO TABS: ORAL_TABLET | ORAL | 0 refills | Status: DC

## 2017-10-29 MED ORDER — SODIUM CHLORIDE 0.9 % IV SOLN
102.2000 mg | Freq: Once | INTRAVENOUS | Status: AC
  Administered 2017-10-29: 100 mg via INTRAVENOUS
  Filled 2017-10-29: qty 10

## 2017-10-29 MED ORDER — SODIUM CHLORIDE 0.9 % IV SOLN
Freq: Once | INTRAVENOUS | Status: AC
  Administered 2017-10-29: 12:00:00 via INTRAVENOUS
  Filled 2017-10-29: qty 250

## 2017-10-29 MED ORDER — SODIUM CHLORIDE 0.9 % IV SOLN
45.0000 mg/m2 | Freq: Once | INTRAVENOUS | Status: AC
  Administered 2017-10-29: 54 mg via INTRAVENOUS
  Filled 2017-10-29: qty 9

## 2017-10-29 MED ORDER — FAMOTIDINE IN NACL 20-0.9 MG/50ML-% IV SOLN
20.0000 mg | Freq: Once | INTRAVENOUS | Status: AC
  Administered 2017-10-29: 20 mg via INTRAVENOUS

## 2017-10-29 MED ORDER — SODIUM CHLORIDE 0.9 % IV SOLN
20.0000 mg | Freq: Once | INTRAVENOUS | Status: AC
  Administered 2017-10-29: 20 mg via INTRAVENOUS
  Filled 2017-10-29: qty 2

## 2017-10-29 MED ORDER — FAMOTIDINE IN NACL 20-0.9 MG/50ML-% IV SOLN
INTRAVENOUS | Status: AC
  Filled 2017-10-29: qty 50

## 2017-10-29 MED ORDER — DIPHENHYDRAMINE HCL 50 MG/ML IJ SOLN
50.0000 mg | Freq: Once | INTRAMUSCULAR | Status: AC
  Administered 2017-10-29: 50 mg via INTRAVENOUS

## 2017-10-29 MED ORDER — PALONOSETRON HCL INJECTION 0.25 MG/5ML
0.2500 mg | Freq: Once | INTRAVENOUS | Status: AC
  Administered 2017-10-29: 0.25 mg via INTRAVENOUS

## 2017-10-29 MED ORDER — PALONOSETRON HCL INJECTION 0.25 MG/5ML
INTRAVENOUS | Status: AC
  Filled 2017-10-29: qty 5

## 2017-10-29 MED ORDER — DIPHENHYDRAMINE HCL 50 MG/ML IJ SOLN
INTRAMUSCULAR | Status: AC
  Filled 2017-10-29: qty 1

## 2017-10-29 NOTE — Patient Instructions (Signed)
   [facility] Discharge Instructions for Patients Receiving Chemotherapy  Today you received the following chemotherapy agents Taxol and Carboplatin   To help prevent nausea and vomiting after your treatment, we encourage you to take your nausea medication as directed.    If you develop nausea and vomiting that is not controlled by your nausea medication, call the clinic.   BELOW ARE SYMPTOMS THAT SHOULD BE REPORTED IMMEDIATELY:  *FEVER GREATER THAN 100.5 F  *CHILLS WITH OR WITHOUT FEVER  NAUSEA AND VOMITING THAT IS NOT CONTROLLED WITH YOUR NAUSEA MEDICATION  *UNUSUAL SHORTNESS OF BREATH  *UNUSUAL BRUISING OR BLEEDING  TENDERNESS IN MOUTH AND THROAT WITH OR WITHOUT PRESENCE OF ULCERS  *URINARY PROBLEMS  *BOWEL PROBLEMS  UNUSUAL RASH Items with * indicate a potential emergency and should be followed up as soon as possible.  Feel free to call the clinic should you have any questions or concerns. The clinic phone number is [phone].  Please show the CHEMO ALERT CARD at check-in to the Emergency Department and triage nurse.   

## 2017-10-29 NOTE — Telephone Encounter (Signed)
Pt sched per 10/21 sch message. Pt was seen in the treatment area.

## 2017-10-29 NOTE — Progress Notes (Signed)
The patient was seen in the infusion room today.  She reports having muscle spasms over her bilateral lower back since beginning radiation.  She reports that the pain is fleeting and feels like a spasm.  She had a member of her family rub her back this past weekend which helped with her pain.  Her lower back was examined.  There was an area of muscle tightness and tenderness in the right posterior lower back.  She was given a prescription for Robaxin 500 mg with instructions to take 1/2 to 1 tablet twice daily as needed.  Sandi Mealy, MHS, PA-C Physician Assistant

## 2017-10-29 NOTE — Progress Notes (Signed)
Nutrition follow-up completed with patient during infusion for lung cancer. Weight increased slightly at 72.1 pounds October 14 up from 71.8 pounds July 16.  Weight is overall stable. Patient reports her appetite and her oral intake have improved. She states she has nausea but did not take her medication because she wanted to see if it would go away. Reports she had constipation however it is resolved.  Nutrition diagnosis: Severe malnutrition continues.  Intervention: Patient educated to increase oral nutrition supplements 3 times daily between meals. Recommended higher calorie, higher protein meals and snacks. Encourage patient to increase fluid.  Monitoring, evaluation, goals: Patient will increase calories and protein to minimize weight loss.  Next visit: To be scheduled as needed.  **Disclaimer: This note was dictated with voice recognition software. Similar sounding words can inadvertently be transcribed and this note may contain transcription errors which may not have been corrected upon publication of note.**

## 2017-10-30 ENCOUNTER — Encounter (HOSPITAL_COMMUNITY): Payer: Self-pay | Admitting: Emergency Medicine

## 2017-10-30 ENCOUNTER — Emergency Department (HOSPITAL_COMMUNITY)
Admission: EM | Admit: 2017-10-30 | Discharge: 2017-10-30 | Disposition: A | Discharge status: Home / Self Care | Payer: PPO | Source: Home / Self Care | Attending: Emergency Medicine | Admitting: Emergency Medicine

## 2017-10-30 ENCOUNTER — Other Ambulatory Visit: Payer: Self-pay

## 2017-10-30 ENCOUNTER — Ambulatory Visit
Admission: RE | Admit: 2017-10-30 | Discharge: 2017-10-30 | Disposition: A | Discharge status: Home / Self Care | Payer: PPO | Source: Ambulatory Visit | Attending: Radiation Oncology | Admitting: Radiation Oncology

## 2017-10-30 ENCOUNTER — Other Ambulatory Visit: Payer: Self-pay | Admitting: Urology

## 2017-10-30 DIAGNOSIS — M546 Pain in thoracic spine: Secondary | ICD-10-CM

## 2017-10-30 DIAGNOSIS — R109 Unspecified abdominal pain: Secondary | ICD-10-CM

## 2017-10-30 DIAGNOSIS — C3432 Malignant neoplasm of lower lobe, left bronchus or lung: Secondary | ICD-10-CM

## 2017-10-30 DIAGNOSIS — M7918 Myalgia, other site: Secondary | ICD-10-CM

## 2017-10-30 MED ORDER — LIDOCAINE 4 % EX PTCH: 1.0000 | MEDICATED_PATCH | Freq: Two times a day (BID) | CUTANEOUS | 0 refills | Status: DC

## 2017-10-30 MED ORDER — ACETAMINOPHEN ER 650 MG PO TBCR: 650.0000 mg | EXTENDED_RELEASE_TABLET | Freq: Three times a day (TID) | ORAL | 0 refills | Status: DC | PRN

## 2017-10-30 MED ORDER — HYDROCODONE-ACETAMINOPHEN 5-325 MG PO TABS: 1.0000 | ORAL_TABLET | Freq: Three times a day (TID) | ORAL | 0 refills | Status: AC | PRN

## 2017-10-30 MED ORDER — LIDOCAINE 5 % EX PTCH
1.0000 | MEDICATED_PATCH | CUTANEOUS | Status: DC
  Administered 2017-10-30: 1 via TRANSDERMAL
  Filled 2017-10-30: qty 1

## 2017-10-30 MED ORDER — OXYCODONE-ACETAMINOPHEN 5-325 MG PO TABS
1.0000 | ORAL_TABLET | Freq: Once | ORAL | Status: AC
  Administered 2017-10-30: 1 via ORAL
  Filled 2017-10-30: qty 1

## 2017-10-30 NOTE — Progress Notes (Signed)
Patient reports acute onset of mid-low back pain which radiates into the right flank over the past 3 weeks and progressively worsening.  She has completed 32 of 33 planned radiation treatments to her left lung.  Her pain became so severe yesterday that she was seen in the symptom management clinic and prescribed muscle relaxers.  The Robaxin did not provide relief so she presented to the ED last night but refused labs or imaging.  She was given a Rx for Vicodin 5/325 to take one po q 8 hrs #9 prescribed. She does report temporary pain relief with the combination of Robaxin and Vicodin but came around to the clinic following her treatment today complaining of persistent pain. On exam, she is point tender to palpate over the levels of T10-T12 and into the paravertebral muscles on the right flank.  She denies pain into the buttocks or LEs.  She has longstanding numbness/tingling bilateral feet for the better part of 6 months and unchanged recently.  She denies loss of bowel or bladder control.  No prior history of nephrolithiasis and no recent hematuria or pain with urination.  Her pain is increased with any activity, especially bending and twisting and occasionally improves with standing. She is unable to lay flat due to pain and sitting any period of time exacerbates her pain as well.  I have recommended we proceed with imaging of the thoracic spine to further evaluate the source of her pain.  Unfortunately, she reports that she is severely claustrophobic and unable to tolerate MRI scans even with use of anti-anxiolytics such as Ativan.  We will proceed with CT Thoracic spine and patient prefers to have this tomorrow, prior to her scheduled radiation treatment so that she can take a pain pill and will have someone to drive her home.  Advised that she can use the Vicodin 5/325mg  po q 4hrs prn pain in combination with Robaxin as prescribed. I will follow up with her once the results are available and further  treatment/evaluation will be discussed at that time.  She appears to have a good understanding of these recommendations and is in agreement with the stated plan.   Nicholos Johns, MMS, PA-C [facility]: [phone]  Fax: [phone]

## 2017-10-30 NOTE — Discharge Instructions (Addendum)
We saw in the ER for the flank pain.  It appears that the pain is musculoskeletal in nature, therefore we are giving you medications for pain control.  You have agreed to see your primary care doctor or your cancer doctor for the pain if it continues, so that they can order further studies if needed or give you medications for better control of the pain.

## 2017-10-30 NOTE — ED Provider Notes (Signed)
[facility] DEPT Provider Note   CSN: [id number] Arrival date & time: 10/29/17  2313     History   Chief Complaint Chief Complaint  Patient presents with  . Back Pain    HPI Sherry [name] is a 72 y.o. female.  HPI  72 year old female comes in with chief complaint of back pain.  Patient has a lung lesion in the left lower lung and history of COPD.  She reports that she started having right-sided flank pain earlier in the day.  Patient saw her chemoinfusion team and was given Robaxin, which has not helped.  In addition to chemo, patient also is undergoing radiation on the contralateral side compared to the pain site.  Patient describes the pain as a " grabbing" pain and sharp pain.  There is no burning and she denies any rash or history of similar pain in the past. Patient denies any history of kidney stones, burning with urination, blood in the urine, vaginal discharge or bleeding, cough, shortness of breath.  Patient denies any history of PE.  Patient states that she just had lab work-up done earlier today and does not want to give [place] a urine sample because she has not seen any blood in the urine.  She would prefer simply getting better pain control and to follow-up with her primary team if the pain continues.  Past Medical History:  Diagnosis Date  . Allergy   . Anxiety   . Arthritis   . Cavitating mass in left lower lung lobe   . Complication of anesthesia    woke up during procedure  . COPD (chronic obstructive pulmonary disease) ([place])   . Depression   . Family history of adverse reaction to anesthesia    " my mother was always hard to wake up"  . Full dentures   . Headache   . Neuromuscular disorder ([place])   . Pneumonia   . Wears glasses     Patient Active Problem List   Diagnosis Date Noted  . Angular cheilitis 10/22/2017  . Cellulitis of right knee 10/22/2017  . Encounter for antineoplastic chemotherapy 09/09/2017  . Goals of  care, counseling/discussion 09/09/2017  . Malnutrition ([place]) 09/09/2017  . Pneumothorax 08/31/2017  . Primary malignant neoplasm of bronchus of left lower lobe ([place]) 08/26/2017  . Mediastinal mass 08/23/2017    Past Surgical History:  Procedure Laterality Date  . ELBOW SURGERY     for  "Tennis Elbow"  . hand surgery    . MULTIPLE TOOTH EXTRACTIONS    . TUBAL LIGATION    . VIDEO BRONCHOSCOPY N/A 08/31/2017   Procedure: VIDEO BRONCHOSCOPY;  Surgeon: Melrose Nakayama, MD;  Location: [facility] OR;  Service: Thoracic;  Laterality: N/A;     OB History   None      Home Medications    Prior to Admission medications   Medication Sig Start Date End Date Taking? Authorizing Provider  acetaminophen (TYLENOL 8 HOUR) 650 MG CR tablet Take 1 tablet (650 mg total) by mouth every 8 (eight) hours as needed for pain or fever. 10/30/17   Varney Biles, MD  cyanocobalamin (,VITAMIN B-12,) 1000 MCG/ML injection Inject 1,000 mcg into the muscle every 30 (thirty) days. Administered by Dr. Eldridge Abrahams    [provider]  doxycycline (VIBRA-TABS) 100 MG tablet Take 1 tablet (100 mg total) by mouth 2 (two) times daily. 10/22/17   Maryanna Shape, NP  ENSURE (ENSURE) Take 237 mLs by mouth daily.  [provider]  gabapentin (NEURONTIN) 250 MG/5ML solution Take 3 mLs (150 mg total) by mouth at bedtime. 09/12/17   Tanner, Lyndon Code., PA-C  guaifenesin (ROBITUSSIN) 100 MG/5ML syrup Take by mouth daily.    [provider]  HYDROcodone-acetaminophen (NORCO/VICODIN) 5-325 MG tablet Take 1 tablet by mouth every 8 (eight) hours as needed for up to 3 days for severe pain. 10/30/17 11/02/17  Varney Biles, MD  Lidocaine 4 % PTCH Apply 1 patch topically 2 (two) times daily. 10/30/17   Varney Biles, MD  meloxicam (MOBIC) 7.5 MG tablet Take 1 tablet (7.5 mg total) by mouth daily. 12/29/15   Weber, Damaris Hippo, PA-C  Menthol-Ascorbic Acid (LUDENS COUGH DROPS MT) Use as directed 1 lozenge in the  mouth or throat 3 (three) times daily.     [provider]  methocarbamol (ROBAXIN) 500 MG tablet 1/2 to 1 tablet twice daily as needed for muscle spasms 10/29/17   Harle Stanford., PA-C  methylPREDNISolone (MEDROL DOSEPAK) 4 MG TBPK tablet Use as instructed 09/07/17   Curt Bears, MD  Multiple Vitamin (MULTIVITAMIN WITH MINERALS) TABS tablet Take 1 tablet by mouth daily.    [provider]  naproxen sodium (ALEVE) 220 MG tablet Take 220 mg by mouth daily as needed (headache).    [provider]  nystatin (MYCOSTATIN) 100000 UNIT/ML suspension Take 5 mLs by mouth See admin instructions. Swish and spit 5 mls daily 08/27/17   [provider]  nystatin cream (MYCOSTATIN) Apply 1 application topically 2 (two) times daily as needed for dry skin. 10/22/17   Maryanna Shape, NP  prochlorperazine (COMPAZINE) 10 MG tablet Take 1 tablet (10 mg total) by mouth every 6 (six) hours as needed for nausea or vomiting. 10/01/17   Curt Bears, MD    Family History Family History  Problem Relation Age of Onset  . Diabetes Mother     Social History Social History   Tobacco Use  . Smoking status: Former Smoker    Packs/day: 1.00    Years: 50.00    Pack years: 50.00    Types: Cigarettes    Last attempt to quit: 08/24/2017    Years since quitting: 0.1  . Smokeless tobacco: Never Used  Substance Use Topics  . Alcohol use: No  . Drug use: No     Allergies   Penicillins   Review of Systems Review of Systems  Constitutional: Positive for activity change.  Respiratory: Negative for cough and shortness of breath.   Cardiovascular: Negative for chest pain.  Genitourinary: Negative for dysuria.  Skin: Negative for rash.  Allergic/Immunologic: Positive for immunocompromised state.  Hematological: Does not bruise/bleed easily.     Physical Exam Updated Vital Signs BP 125/65   Pulse 67   Temp 97.9 F (36.6 C) (Oral)   Resp 16   Ht 5\' 1"  (1.549 m)    Wt 33.6 kg   SpO2 100%   BMI 13.98 kg/m   Physical Exam  Constitutional: She is oriented to person, place, and time. She appears well-developed.  HENT:  Head: Normocephalic and atraumatic.  Eyes: EOM are normal.  Neck: Normal range of motion. Neck supple.  Cardiovascular: Normal rate.  Pulmonary/Chest: Effort normal.  Patient has an erythematous patch on the left posterior thoracic region.  Over the right flank region she has reproducible tenderness with palpation.  The tenderness appears to be below the rib cage and there is no associated rash.  Abdominal: Bowel sounds are normal. There is no  tenderness.  Neurological: She is alert and oriented to person, place, and time.  Skin: Skin is warm and dry.  Nursing note and vitals reviewed.    ED Treatments / Results  Labs (all labs ordered are listed, but only abnormal results are displayed) Labs Reviewed - No data to display  EKG None  Radiology No results found.  Procedures Procedures (including critical care time)  Medications Ordered in ED Medications  lidocaine (LIDODERM) 5 % 1 patch (1 patch Transdermal Patch Applied 10/30/17 0145)  oxyCODONE-acetaminophen (PERCOCET/ROXICET) 5-325 MG per tablet 1 tablet (1 tablet Oral Given 10/30/17 0145)     Initial Impression / Assessment and Plan / ED Course  I have reviewed the triage vital signs and the nursing notes.  Pertinent labs & imaging results that were available during my care of the patient were reviewed by me and considered in my medical decision making (see chart for details).     72 year old female comes in with chief complaint of back pain.  Patient has history of left-sided lung cancer and she is complaining of right-sided flank pain that started earlier in the day.  Pain is intermittent and lasting only for 2 or 3 seconds, but it has been constantly returning since this evening.  Patient took Robaxin without any relief.  She does not have any history of kidney  stones and does not have any UTI-like symptoms.  There is no cough or shortness of breath, and the pain is not pleuritic.  She had blood work done earlier in the day and her creatinine was normal.  At this time patient has declined any imaging.  She had a CT chest done last month which was negative for any acute findings on the right side.  We will treat with some pain medication and lidocaine patch.  She will follow-up with her own doctors to see if advanced imaging is needed.  Final Clinical Impressions(s) / ED Diagnoses   Final diagnoses:  Acute right flank pain  Musculoskeletal pain    ED Discharge Orders         Ordered    Lidocaine 4 % PTCH  2 times daily     10/30/17 0204    HYDROcodone-acetaminophen (NORCO/VICODIN) 5-325 MG tablet  Every 8 hours PRN     10/30/17 0204    acetaminophen (TYLENOL 8 HOUR) 650 MG CR tablet  Every 8 hours PRN     10/30/17 0204           Varney Biles, MD 10/30/17 0211

## 2017-10-30 NOTE — ED Triage Notes (Signed)
Pt presents with back pain that started yesterday around 11am while at the infusion center. Pt denies any injury to back and denies any urinary symptoms. Pt was prescribed medications at infusion center and reports no improvement in pain.

## 2017-10-31 ENCOUNTER — Ambulatory Visit: Payer: PPO

## 2017-10-31 ENCOUNTER — Ambulatory Visit
Admission: RE | Admit: 2017-10-31 | Discharge: 2017-10-31 | Disposition: A | Discharge status: Home / Self Care | Payer: PPO | Source: Ambulatory Visit | Attending: Radiation Oncology | Admitting: Radiation Oncology

## 2017-10-31 ENCOUNTER — Telehealth: Payer: Self-pay | Admitting: *Deleted

## 2017-10-31 DIAGNOSIS — C3432 Malignant neoplasm of lower lobe, left bronchus or lung: Secondary | ICD-10-CM | POA: Diagnosis not present

## 2017-10-31 NOTE — Telephone Encounter (Signed)
CALLED PATIENT TO INFORM OF CT FOR 11-01-17 - ARRIVAL TIME - 1:45 PM @ [facility], NO RESTRICTIONS TO TEST, LVM FOR A RETURN CALL

## 2017-11-01 ENCOUNTER — Ambulatory Visit (HOSPITAL_COMMUNITY)
Admission: RE | Admit: 2017-11-01 | Discharge: 2017-11-01 | Disposition: A | Discharge status: Home / Self Care | Payer: PPO | Source: Ambulatory Visit | Attending: Urology | Admitting: Urology

## 2017-11-01 ENCOUNTER — Ambulatory Visit: Payer: PPO

## 2017-11-01 DIAGNOSIS — M546 Pain in thoracic spine: Secondary | ICD-10-CM

## 2017-11-01 DIAGNOSIS — J90 Pleural effusion, not elsewhere classified: Secondary | ICD-10-CM | POA: Insufficient documentation

## 2017-11-01 DIAGNOSIS — M4854XA Collapsed vertebra, not elsewhere classified, thoracic region, initial encounter for fracture: Secondary | ICD-10-CM | POA: Diagnosis not present

## 2017-11-01 DIAGNOSIS — C3432 Malignant neoplasm of lower lobe, left bronchus or lung: Secondary | ICD-10-CM | POA: Diagnosis not present

## 2017-11-01 DIAGNOSIS — S22070A Wedge compression fracture of T9-T10 vertebra, initial encounter for closed fracture: Secondary | ICD-10-CM | POA: Diagnosis not present

## 2017-11-01 DIAGNOSIS — S22009A Unspecified fracture of unspecified thoracic vertebra, initial encounter for closed fracture: Secondary | ICD-10-CM

## 2017-11-01 HISTORY — DX: Unspecified fracture of unspecified thoracic vertebra, initial encounter for closed fracture: S22.009A

## 2017-11-01 MED ORDER — IOHEXOL 300 MG/ML  SOLN
75.0000 mL | Freq: Once | INTRAMUSCULAR | Status: AC | PRN
  Administered 2017-11-01: 75 mL via INTRAVENOUS

## 2017-11-01 MED ORDER — SODIUM CHLORIDE 0.9 % IJ SOLN
INTRAMUSCULAR | Status: AC
  Filled 2017-11-01: qty 50

## 2017-11-02 ENCOUNTER — Other Ambulatory Visit: Payer: Self-pay | Admitting: Urology

## 2017-11-02 ENCOUNTER — Telehealth: Payer: Self-pay | Admitting: Urology

## 2017-11-02 ENCOUNTER — Ambulatory Visit: Payer: PPO

## 2017-11-02 DIAGNOSIS — S22000A Wedge compression fracture of unspecified thoracic vertebra, initial encounter for closed fracture: Secondary | ICD-10-CM | POA: Insufficient documentation

## 2017-11-02 DIAGNOSIS — S22070A Wedge compression fracture of T9-T10 vertebra, initial encounter for closed fracture: Secondary | ICD-10-CM

## 2017-11-02 MED ORDER — OXYCODONE-ACETAMINOPHEN 5-325 MG PO TABS: 1.0000 | ORAL_TABLET | ORAL | 0 refills | Status: DC | PRN

## 2017-11-02 NOTE — Telephone Encounter (Signed)
I spoke with Sherry [name] over the phone and she reports that her pain remains poorly controlled with Vicodin. She also reports associated nausea but is unsure whether this is related to the pain or the "awful mettalic taste in my mouth".  She is taking Compazine prn nausea with relief.   I informed her of the results of her CT scan which shows a new compression fracture at the level of T9.  She reports that she has been using Alleve as this seems to provide more relief than the vicodin did but still rates her pain 8/10 in severity, especially with activity or sitting any period of time.  We did discuss the potential role of kyphoplasty if her pain is not well controlled with conservative measures such as limited activity, rest and pain medications.  I have offered a Rx for percocet to see if this provides better management of her pain.  We reviewed percautions such as no lifting over 5lbs, avoid bending, twisting or stooping. I encouraged her to stay active with walking and gentle stretching- avoid sitting or lying down for extending periods of time as this can exacerbate her symptoms.  She appears to have a good understanding of these recommendations and is in agreement.  She will call next week to report her progress and if no significant improvement in pain control despite the above mentioned measures, she is agreeable to pursue a consult with Interventional Radiology for consideration of kyphoplasty for pain control and management of her T9 compression fx.    Nicholos Johns, MMS, PA-C [facility] at [place]: [phone]  Fax: [phone]

## 2017-11-02 NOTE — Telephone Encounter (Signed)
I left a message on the patient's home answering machine requesting that she return my call.  I just want to inform her of the recent CT Thoracic spine results and to check in and see how she is doing in regards to her back pain.  CT does show an acute compression fracture deformity at T9 which correlates with her symptoms.  If her pain remains severe and is not well managed on narcotic pain medications, we would consider getting her in for consult with one of the IR providers to discuss kyphoplasty. I will discuss this with her further when she returns my call.    Nicholos Johns, MMS, PA-C [place] at [place]: [phone]  Fax: [phone]

## 2017-11-05 ENCOUNTER — Encounter: Payer: Self-pay | Admitting: Radiation Oncology

## 2017-11-05 NOTE — Progress Notes (Signed)
  Radiation Oncology         [phone] ________________________________  Name: Sherry [name] MRN: [id number]  Date: 11/05/2017  DOB: [birth year]   End of Treatment Note  Diagnosis:   72 yo woman with cT3 N0 M0 squamous cell carcinoma of the left lower lung     Indication for treatment:  Curative, Chemo-Radiotherapy       Radiation treatment dates:   09/17/17 - 10/31/17  Site/dose:   The primary tumor and involved mediastinal adenopathy were treated to 66 Gy in 33 fractions of 2 Gy.  Beams/energy:   A five field 3D conformal treatment arrangement was used delivering 6 and 10 MV photons.  Daily image-guidance CT was used to align the treatment with the targeted volume  Narrative: The patient tolerated radiation treatment relatively well with mild-moderate fatigue.  She denied chest pain, shortness of breath, dysphagia, weight loss, or hemoptysis throughout treatment. She reported a cough with clear-white sputum towards the middle of treatments and experienced increased hyperpigmentation to her back and chest with an episode of dry itchy desquamation that was relieved with Radiaplex gel.   Early into her treatments she began complaining of severe mid-back pain that was making lying flat for treatments very difficult.  She is unable to tolerate MRI scans due to severe claustrophobia despite pre-medication so she had a CT T-spine which showed an acute/subacute mild T9 compression fracture deformity, without complicating features. There were no paraspinal masses, osseous destruction, or other suggestion of osseous metastatic disease. Her pain was not well controlled with narcotic pain medications and reduced activity so she was eventually referred to IR for consideration of kyphoplasty as she had failed conservative management.  She was evaluated with Dr. Vernard Gambles on 11/20/17 and felt to be a good candidate for kyphoplasty which has ben scheduled for 11/27/17.  Plan: The patient has completed  radiation treatment. The patient will return to radiation oncology clinic for routine followup in one month. I advised her to call or return sooner if she has any questions or concerns related to her recovery or treatment.  ________________________________  Sheral Apley. Tammi Klippel, M.D.  This document serves as a record of services personally performed by Tyler Pita, MD. It was created on his behalf by Wilburn Mylar, a trained medical scribe. The creation of this record is based on the scribe's personal observations and the provider's statements to them. This document has been checked and approved by the attending provider.

## 2017-11-06 ENCOUNTER — Telehealth: Payer: Self-pay | Admitting: Urology

## 2017-11-06 DIAGNOSIS — E538 Deficiency of other specified B group vitamins: Secondary | ICD-10-CM | POA: Diagnosis not present

## 2017-11-06 NOTE — Telephone Encounter (Signed)
I left a message on the patient's home machine requesting that she return my call to follow-up on her progress since switching pain medications to Percocet to see if this is providing better pain relief.   Nicholos Johns, MMS, PA-C [facility] at [place]: [phone]  Fax: [phone]

## 2017-11-07 ENCOUNTER — Telehealth: Payer: Self-pay | Admitting: Urology

## 2017-11-07 ENCOUNTER — Other Ambulatory Visit: Payer: Self-pay | Admitting: Urology

## 2017-11-07 DIAGNOSIS — S22070A Wedge compression fracture of T9-T10 vertebra, initial encounter for closed fracture: Secondary | ICD-10-CM

## 2017-11-07 DIAGNOSIS — M546 Pain in thoracic spine: Secondary | ICD-10-CM

## 2017-11-07 NOTE — Telephone Encounter (Signed)
I spoke with patient by phone this afternoon and she reports that her pain is still not well controlled despite switching to Percocet and limiting her activities.  Pain is significantly impacting her ability to remain active and she is interested in considering kyphoplasty.  She denies any new or progressive symptoms with focal weakness, paraesthesias or loss of bowel or bladder control.  I will make the referral for consult with interventional radiology.  She is comfortable and in agreement with this plan.   Nicholos Johns, MMS, PA-C [facility] at [place]: [phone]  Fax: [phone]

## 2017-11-13 ENCOUNTER — Other Ambulatory Visit: Payer: Self-pay | Admitting: Urology

## 2017-11-13 DIAGNOSIS — S22070A Wedge compression fracture of T9-T10 vertebra, initial encounter for closed fracture: Secondary | ICD-10-CM

## 2017-11-15 ENCOUNTER — Telehealth: Payer: Self-pay | Admitting: *Deleted

## 2017-11-15 NOTE — Telephone Encounter (Signed)
[facility] has called this patient three times to schedule consultation for kyphoplasty and this patient hasn't returned their calls

## 2017-11-20 ENCOUNTER — Other Ambulatory Visit: Payer: Self-pay | Admitting: Interventional Radiology

## 2017-11-20 ENCOUNTER — Ambulatory Visit
Admission: RE | Admit: 2017-11-20 | Discharge: 2017-11-20 | Disposition: A | Discharge status: Home / Self Care | Payer: PPO | Source: Ambulatory Visit | Attending: Urology | Admitting: Urology

## 2017-11-20 DIAGNOSIS — S22070A Wedge compression fracture of T9-T10 vertebra, initial encounter for closed fracture: Secondary | ICD-10-CM

## 2017-11-20 DIAGNOSIS — S22000A Wedge compression fracture of unspecified thoracic vertebra, initial encounter for closed fracture: Secondary | ICD-10-CM

## 2017-11-20 DIAGNOSIS — M4854XA Collapsed vertebra, not elsewhere classified, thoracic region, initial encounter for fracture: Secondary | ICD-10-CM | POA: Diagnosis not present

## 2017-11-20 HISTORY — PX: IR RADIOLOGIST EVAL & MGMT: IMG5224

## 2017-11-20 HISTORY — DX: Unspecified fracture of unspecified thoracic vertebra, initial encounter for closed fracture: S22.009A

## 2017-11-20 NOTE — Consult Note (Signed)
Chief Complaint: Patient was seen in consultation today for midthoracic pain at the request of Bruning,Ashlyn  Referring Physician(s): Bruning,Ashlyn  History of Present Illness: Sherry [name] is a 72 y.o. female With recent diagnosis of lung carcinoma, status post radiation therapy.  After several sessions, she developed mid low back pain radiating to right flank, progressively worsening.  This did not respond to Robaxin.  She is given a trial of Vicodin 5/325 but she reports this did little to help her pain.  Currently she is using Tylenol twice daily for pain control.  She rates her pain 10 out of 10 on the visual analog pain scale at its worst, decreased to 5 out of 10 with Tylenol and laying flat.  Pain is exacerbated by standing and walking.  No new bowel or bladder control issues.  She has bilateral foot numbness attributed to neuropathy.  No new lower extremity    weakness. She states that she had a bone mineral density test some years ago that was normal.  No history of vertebral compression fractures.  No previous spinal surgery.  Past Medical History:  Diagnosis Date  . Allergy   . Anxiety   . Arthritis   . Cavitating mass in left lower lung lobe   . Complication of anesthesia    woke up during procedure  . COPD (chronic obstructive pulmonary disease) ([place])   . Depression   . Family history of adverse reaction to anesthesia    " my mother was always hard to wake up"  . Full dentures   . Headache   . Neuromuscular disorder ([place])   . Pneumonia   . Thoracic spine fracture ([place]) 11/01/2017   T9  . Wears glasses     Past Surgical History:  Procedure Laterality Date  . ELBOW SURGERY     for  "Tennis Elbow"  . hand surgery    . IR RADIOLOGIST EVAL & MGMT  11/20/2017  . MULTIPLE TOOTH EXTRACTIONS    . TUBAL LIGATION    . VIDEO BRONCHOSCOPY N/A 08/31/2017   Procedure: VIDEO BRONCHOSCOPY;  Surgeon: Melrose Nakayama, MD;  Location: [facility];  Service: Thoracic;   Laterality: N/A;    Allergies: Penicillins  Medications: Prior to Admission medications   Medication Sig Start Date End Date Taking? Authorizing Provider  acetaminophen (TYLENOL 8 HOUR) 650 MG CR tablet Take 1 tablet (650 mg total) by mouth every 8 (eight) hours as needed for pain or fever. 10/30/17  Yes Nanavati, Ankit, MD  cyanocobalamin (,VITAMIN B-12,) 1000 MCG/ML injection Inject 1,000 mcg into the muscle every 30 (thirty) days. Administered by Dr. Eldridge Abrahams   Yes [provider]  guaifenesin (ROBITUSSIN) 100 MG/5ML syrup Take by mouth daily.   Yes [provider]  Menthol-Ascorbic Acid (LUDENS COUGH DROPS MT) Use as directed 1 lozenge in the mouth or throat 3 (three) times daily.    Yes [provider]  Multiple Vitamin (MULTIVITAMIN WITH MINERALS) TABS tablet Take 1 tablet by mouth daily.   Yes [provider]  nystatin cream (MYCOSTATIN) Apply 1 application topically 2 (two) times daily as needed for dry skin. 10/22/17  Yes CurcioRoselie Awkward, NP     Family History  Problem Relation Age of Onset  . Diabetes Mother     Social History   Socioeconomic History  . Marital status: Divorced    Spouse name: Not on file  . Number of children: Not on file  . Years of education: Not  on file  . Highest education level: Not on file  Occupational History  . Not on file  Social Needs  . Financial resource strain: Not on file  . Food insecurity:    Worry: Not on file    Inability: Not on file  . Transportation needs:    Medical: Not on file    Non-medical: Not on file  Tobacco Use  . Smoking status: Former Smoker    Packs/day: 1.00    Years: 50.00    Pack years: 50.00    Types: Cigarettes    Last attempt to quit: 08/24/2017    Years since quitting: 0.2  . Smokeless tobacco: Never Used  Substance and Sexual Activity  . Alcohol use: No  . Drug use: No  . Sexual activity: Never  Lifestyle  . Physical activity:    Days per week: Not on  file    Minutes per session: Not on file  . Stress: Not on file  Relationships  . Social connections:    Talks on phone: Not on file    Gets together: Not on file    Attends religious service: Not on file    Active member of club or organization: Not on file    Attends meetings of clubs or organizations: Not on file    Relationship status: Not on file  Other Topics Concern  . Not on file  Social History Narrative  . Not on file    ECOG Status: 1 - Symptomatic but completely ambulatory  Review of Systems: A 12 point ROS discussed and pertinent positives are indicated in the HPI above.  All other systems are negative.  Review of Systems  Vital Signs: BP 116/62 (BP Location: Right Arm, Patient Position: Sitting, Cuff Size: Normal)   Pulse 86   Temp 98.5 F (36.9 C)   Resp 16   Ht 5\' 1"  (1.549 m)   Wt 33.6 kg   BMI 13.98 kg/m    Physical Exam Constitutional: Oriented to person, place, and time.  Thin. No distress.  Smells of tobacco smoke. Last Weight  Most recent update: 11/20/2017  1:17 PM   Weight  33.6 kg (74 lb)           HENT:  Head: Normocephalic and atraumatic.  Eyes: Conjunctivae and EOM are normal. Right eye exhibits no discharge. Left eye exhibits no discharge. No scleral icterus.  Neck: No JVD present.  Pulmonary/Chest: Effort normal. No stridor. No respiratory distress.  Focal midline tenderness in the midthoracic region. Abdomen: soft, non distended Neurological:  alert and oriented to person, place, and time.  Ambulates with an antalgic gait unsupported. Skin: Skin is warm and dry.  not diaphoretic.  Psychiatric:   normal mood and affect.   behavior is normal. Judgment and thought content normal.        Imaging: Ct Thoracic Spine W Contrast  Result Date: 11/01/2017 CLINICAL DATA:  Mid back pains. Pt has hx of lung cancer. eval for possible mets vx compression fx. Mid-low back pain which radiates into the right flank over the past 3 weeks and  progressively worsening. She has completed 32 of 33 planned radiation treatments to her left lung. Her pain became so severe yesterday that she was seen in the symptom management clinic and prescribed muscle relaxers. The Robaxin did not provide relief so she presented to the ED last night but refused labs or imaging. temporary pain relief with the combination of Robaxin and Vicodin but came around to the  clinic following her treatment today complaining of persistent pain. On exam, she is point tender to palpate over the levels of T10-T12 and into the paravertebral muscles on the right flank. She denies pain into the buttocks or LEs. She has longstanding numbness/tingling bilateral feet for the better part of 6 months and unchanged recently. " EXAM: CT THORACIC SPINE WITH CONTRAST TECHNIQUE: Multidetector CT images of thoracic was performed according to the standard protocol following intravenous contrast administration. CONTRAST:  107mL OMNIPAQUE IOHEXOL 300 MG/ML  SOLN COMPARISON:  09/16/2017 and previous FINDINGS: Alignment: Normal Vertebrae: Mild superior endplate compression deformity of T9, new since 09/16/2017. There is some mild sclerosis in the vertebral body inferior to the superior endplate. No discrete lytic or sclerotic lesion. No paraspinal mass or hematoma. Posterior elements intact. No bony retropulsion into the spinal canal. Minimal anterior endplate spurring at multiple levels in the mid and lower thoracic spine as before. Paraspinal and other soft tissues: Persistent left pleural effusion. Trace right pleural effusion seen previously has resolved. Consolidation/atelectasis in the left lower lobe. Some decrease in left infrahilar mass with central cavitation. Emphysematous change in the visualized right lung and left upper lung. Unremarkable vascular enhancement. No paraspinal mass. Disc levels: Preserved throughout. IMPRESSION: 1. Acute/subacute mild T9 compression fracture deformity, without  complicating features. 2. No paraspinal mass, osseous destruction, or other suggestion of osseous metastatic disease. 3. Interval decrease in size of left infrahilar mass, with new central cavitation. 4. Persistent left pleural effusion. Electronically Signed   By: Lucrezia Europe M.D.   On: 11/01/2017 21:24   Ir Radiologist Eval & Mgmt  Result Date: 11/20/2017 Please refer to notes tab for details about interventional procedure. (Op Note)   Labs:  CBC: Recent Labs    10/08/17 0740 10/15/17 0748 10/22/17 1202 10/29/17 0847  WBC 4.1 3.5* 3.6* 2.5*  HGB 9.5* 10.0* 9.8* 10.1*  HCT 29.9* 31.8* 32.0* 33.2*  PLT 420* 330 296 286    COAGS: Recent Labs    08/31/17 1142  INR 1.04  APTT 32    BMP: Recent Labs    10/08/17 0740 10/15/17 0748 10/22/17 1203 10/29/17 1009  NA 142 143 142 141  K 3.4* 3.6 3.8 3.7  CL 103 106 104 105  CO2 31 28 29 28   GLUCOSE 84 81 88 80  BUN 9 10 12 12   CALCIUM 9.1 8.8* 9.0 8.9  CREATININE 0.60 0.62 0.61 0.62  GFRNONAA >60 >60 >60 >60  GFRAA >60 >60 >60 >60    LIVER FUNCTION TESTS: Recent Labs    10/08/17 0740 10/15/17 0748 10/22/17 1203 10/29/17 1009  BILITOT 0.2* 0.3 0.3 0.3  AST 14* 14* 19 22  ALT 8 9 12 12   ALKPHOS 89 96 101 106  PROT 6.0* 6.4* 6.3* 6.4*  ALBUMIN 2.6* 2.8* 2.9* 2.9*    TUMOR MARKERS: No results for input(s): AFPTM, CEA, CA199, CHROMGRNA in the last 8760 hours.  Assessment and Plan:  My impression is that this patient has a mild acute/subacute thoracic T9 compression fracture deformity.  There is no significant retropulsion.  There is no posterior element involvement.  There is no suggestion on CT of metastatic disease at this level.  Her symptoms remain quite severe.  She  was intolerant of narcotic strength pain medication regimen, and Tylenol is inadequate.  Therefore, I believe she is an appropriate candidate for consideration of percutaneous kyphoplasty for pain control.  I would obtain bone biopsy sample  concurrently given her history of lung carcinoma. I  spent the majority of the consultation discussing with the patient and her sister the pathophysiology of vertebral compression fractures.  We discussed the natural history of healing of these stable fractures.  We discussed other treatment options including surgical fixation and kyphoplasty/vertebroplasty.  We discussed the technique of kyphoplasty, anticipated benefits, possible risks and complications including nontarget cement deposition, and time course of symptom resolution.  She seemed to understand and did ask insightful questions which were all answered.  She is motivated to proceed.  Accordingly, we can set up percutaneous thoracic T9 kyphoplasty and bone biopsy with moderate sedation as an outpatient at the hospital at her convenience.  Thank you for this interesting consult.  I greatly enjoyed meeting Sherry [name] and look forward to participating in their care.  A copy of this report was sent to the requesting provider on this date.  Electronically Signed: Rickard Rhymes 11/20/2017, 1:38 PM   I spent a total of  40 Minutes   in face to face in clinical consultation, greater than 50% of which was counseling/coordinating care for painful subacute T9 compression fracture.

## 2017-11-22 ENCOUNTER — Encounter (HOSPITAL_COMMUNITY): Payer: Self-pay

## 2017-11-22 ENCOUNTER — Ambulatory Visit (HOSPITAL_COMMUNITY)
Admission: RE | Admit: 2017-11-22 | Discharge: 2017-11-22 | Disposition: A | Discharge status: Home / Self Care | Payer: PPO | Source: Ambulatory Visit | Attending: Oncology | Admitting: Oncology

## 2017-11-22 ENCOUNTER — Ambulatory Visit: Payer: Self-pay | Admitting: Urology

## 2017-11-22 ENCOUNTER — Inpatient Hospital Stay: Payer: PPO | Attending: Nurse Practitioner

## 2017-11-22 ENCOUNTER — Other Ambulatory Visit: Payer: Self-pay | Admitting: Medical Oncology

## 2017-11-22 DIAGNOSIS — Z923 Personal history of irradiation: Secondary | ICD-10-CM | POA: Diagnosis not present

## 2017-11-22 DIAGNOSIS — C3432 Malignant neoplasm of lower lobe, left bronchus or lung: Secondary | ICD-10-CM | POA: Diagnosis not present

## 2017-11-22 DIAGNOSIS — Z9221 Personal history of antineoplastic chemotherapy: Secondary | ICD-10-CM | POA: Diagnosis not present

## 2017-11-22 DIAGNOSIS — C3490 Malignant neoplasm of unspecified part of unspecified bronchus or lung: Secondary | ICD-10-CM | POA: Diagnosis not present

## 2017-11-22 DIAGNOSIS — J439 Emphysema, unspecified: Secondary | ICD-10-CM | POA: Insufficient documentation

## 2017-11-22 DIAGNOSIS — Z79899 Other long term (current) drug therapy: Secondary | ICD-10-CM | POA: Diagnosis not present

## 2017-11-22 DIAGNOSIS — J90 Pleural effusion, not elsewhere classified: Secondary | ICD-10-CM | POA: Insufficient documentation

## 2017-11-22 DIAGNOSIS — K869 Disease of pancreas, unspecified: Secondary | ICD-10-CM | POA: Insufficient documentation

## 2017-11-22 LAB — CBC WITH DIFFERENTIAL (CANCER CENTER ONLY)
ABS IMMATURE GRANULOCYTES: 0.04 10*3/uL (ref 0.00–0.07)
BASOS ABS: 0 10*3/uL (ref 0.0–0.1)
Basophils Relative: 1 %
Eosinophils Absolute: 0.1 10*3/uL (ref 0.0–0.5)
Eosinophils Relative: 2 %
HEMATOCRIT: 35.6 % — AB (ref 36.0–46.0)
HEMOGLOBIN: 10.7 g/dL — AB (ref 12.0–15.0)
IMMATURE GRANULOCYTES: 1 %
LYMPHS ABS: 1.1 10*3/uL (ref 0.7–4.0)
LYMPHS PCT: 23 %
MCH: 27.8 pg (ref 26.0–34.0)
MCHC: 30.1 g/dL (ref 30.0–36.0)
MCV: 92.5 fL (ref 80.0–100.0)
Monocytes Absolute: 0.8 10*3/uL (ref 0.1–1.0)
Monocytes Relative: 17 %
NEUTROS ABS: 2.6 10*3/uL (ref 1.7–7.7)
NEUTROS PCT: 56 %
NRBC: 0 % (ref 0.0–0.2)
Platelet Count: 274 10*3/uL (ref 150–400)
RBC: 3.85 MIL/uL — AB (ref 3.87–5.11)
RDW: 23.8 % — ABNORMAL HIGH (ref 11.5–15.5)
WBC: 4.6 10*3/uL (ref 4.0–10.5)

## 2017-11-22 LAB — CMP (CANCER CENTER ONLY)
ALT: 14 U/L (ref 0–44)
ANION GAP: 7 (ref 5–15)
AST: 23 U/L (ref 15–41)
Albumin: 3.1 g/dL — ABNORMAL LOW (ref 3.5–5.0)
Alkaline Phosphatase: 131 U/L — ABNORMAL HIGH (ref 38–126)
BUN: 13 mg/dL (ref 8–23)
CHLORIDE: 106 mmol/L (ref 98–111)
CO2: 30 mmol/L (ref 22–32)
Calcium: 8.9 mg/dL (ref 8.9–10.3)
Creatinine: 0.68 mg/dL (ref 0.44–1.00)
Glucose, Bld: 92 mg/dL (ref 70–99)
Potassium: 3.6 mmol/L (ref 3.5–5.1)
SODIUM: 143 mmol/L (ref 135–145)
Total Bilirubin: 0.3 mg/dL (ref 0.3–1.2)
Total Protein: 6.9 g/dL (ref 6.5–8.1)

## 2017-11-22 MED ORDER — IOHEXOL 300 MG/ML  SOLN
75.0000 mL | Freq: Once | INTRAMUSCULAR | Status: AC | PRN
  Administered 2017-11-22: 75 mL via INTRAVENOUS

## 2017-11-22 MED ORDER — SODIUM CHLORIDE (PF) 0.9 % IJ SOLN
INTRAMUSCULAR | Status: AC
  Filled 2017-11-22: qty 50

## 2017-11-26 ENCOUNTER — Other Ambulatory Visit: Payer: Self-pay | Admitting: Radiology

## 2017-11-26 DIAGNOSIS — H2511 Age-related nuclear cataract, right eye: Secondary | ICD-10-CM | POA: Diagnosis not present

## 2017-11-26 DIAGNOSIS — H40053 Ocular hypertension, bilateral: Secondary | ICD-10-CM | POA: Diagnosis not present

## 2017-11-26 DIAGNOSIS — H25011 Cortical age-related cataract, right eye: Secondary | ICD-10-CM | POA: Diagnosis not present

## 2017-11-26 DIAGNOSIS — H25043 Posterior subcapsular polar age-related cataract, bilateral: Secondary | ICD-10-CM | POA: Diagnosis not present

## 2017-11-26 DIAGNOSIS — H25013 Cortical age-related cataract, bilateral: Secondary | ICD-10-CM | POA: Diagnosis not present

## 2017-11-26 DIAGNOSIS — H2513 Age-related nuclear cataract, bilateral: Secondary | ICD-10-CM | POA: Diagnosis not present

## 2017-11-26 DIAGNOSIS — H268 Other specified cataract: Secondary | ICD-10-CM | POA: Diagnosis not present

## 2017-11-27 ENCOUNTER — Encounter: Payer: Self-pay | Admitting: Internal Medicine

## 2017-11-27 ENCOUNTER — Ambulatory Visit (HOSPITAL_COMMUNITY)
Admission: RE | Admit: 2017-11-27 | Discharge: 2017-11-27 | Disposition: A | Discharge status: Home / Self Care | Payer: PPO | Source: Ambulatory Visit | Attending: Interventional Radiology | Admitting: Interventional Radiology

## 2017-11-27 ENCOUNTER — Encounter (HOSPITAL_COMMUNITY): Payer: Self-pay

## 2017-11-27 ENCOUNTER — Inpatient Hospital Stay (HOSPITAL_BASED_OUTPATIENT_CLINIC_OR_DEPARTMENT_OTHER): Payer: PPO | Admitting: Internal Medicine

## 2017-11-27 ENCOUNTER — Other Ambulatory Visit: Payer: Self-pay

## 2017-11-27 ENCOUNTER — Telehealth: Payer: Self-pay

## 2017-11-27 VITALS — BP 118/59 | HR 76 | Temp 98.0°F | Resp 19

## 2017-11-27 DIAGNOSIS — Z7189 Other specified counseling: Secondary | ICD-10-CM

## 2017-11-27 DIAGNOSIS — Z79899 Other long term (current) drug therapy: Secondary | ICD-10-CM | POA: Diagnosis not present

## 2017-11-27 DIAGNOSIS — M899 Disorder of bone, unspecified: Secondary | ICD-10-CM | POA: Insufficient documentation

## 2017-11-27 DIAGNOSIS — Z9221 Personal history of antineoplastic chemotherapy: Secondary | ICD-10-CM | POA: Insufficient documentation

## 2017-11-27 DIAGNOSIS — Z9851 Tubal ligation status: Secondary | ICD-10-CM | POA: Insufficient documentation

## 2017-11-27 DIAGNOSIS — J449 Chronic obstructive pulmonary disease, unspecified: Secondary | ICD-10-CM | POA: Insufficient documentation

## 2017-11-27 DIAGNOSIS — Z880 Allergy status to penicillin: Secondary | ICD-10-CM | POA: Diagnosis not present

## 2017-11-27 DIAGNOSIS — Z9889 Other specified postprocedural states: Secondary | ICD-10-CM | POA: Insufficient documentation

## 2017-11-27 DIAGNOSIS — C3492 Malignant neoplasm of unspecified part of left bronchus or lung: Secondary | ICD-10-CM | POA: Insufficient documentation

## 2017-11-27 DIAGNOSIS — X58XXXA Exposure to other specified factors, initial encounter: Secondary | ICD-10-CM | POA: Diagnosis not present

## 2017-11-27 DIAGNOSIS — Z923 Personal history of irradiation: Secondary | ICD-10-CM | POA: Diagnosis not present

## 2017-11-27 DIAGNOSIS — C3432 Malignant neoplasm of lower lobe, left bronchus or lung: Secondary | ICD-10-CM

## 2017-11-27 DIAGNOSIS — S22000A Wedge compression fracture of unspecified thoracic vertebra, initial encounter for closed fracture: Secondary | ICD-10-CM | POA: Diagnosis not present

## 2017-11-27 DIAGNOSIS — Z87891 Personal history of nicotine dependence: Secondary | ICD-10-CM | POA: Diagnosis not present

## 2017-11-27 DIAGNOSIS — G709 Myoneural disorder, unspecified: Secondary | ICD-10-CM | POA: Insufficient documentation

## 2017-11-27 DIAGNOSIS — Z85118 Personal history of other malignant neoplasm of bronchus and lung: Secondary | ICD-10-CM | POA: Diagnosis not present

## 2017-11-27 DIAGNOSIS — Z5112 Encounter for antineoplastic immunotherapy: Secondary | ICD-10-CM

## 2017-11-27 DIAGNOSIS — M1990 Unspecified osteoarthritis, unspecified site: Secondary | ICD-10-CM | POA: Insufficient documentation

## 2017-11-27 DIAGNOSIS — M4854XA Collapsed vertebra, not elsewhere classified, thoracic region, initial encounter for fracture: Secondary | ICD-10-CM | POA: Diagnosis not present

## 2017-11-27 HISTORY — PX: IR KYPHO THORACIC WITH BONE BIOPSY: IMG5518

## 2017-11-27 LAB — CBC WITH DIFFERENTIAL/PLATELET
ABS IMMATURE GRANULOCYTES: 0.02 10*3/uL (ref 0.00–0.07)
Basophils Absolute: 0 10*3/uL (ref 0.0–0.1)
Basophils Relative: 1 %
Eosinophils Absolute: 0.1 10*3/uL (ref 0.0–0.5)
Eosinophils Relative: 2 %
HCT: 38.2 % (ref 36.0–46.0)
HEMOGLOBIN: 11.4 g/dL — AB (ref 12.0–15.0)
Immature Granulocytes: 1 %
LYMPHS ABS: 1.1 10*3/uL (ref 0.7–4.0)
LYMPHS PCT: 26 %
MCH: 28.1 pg (ref 26.0–34.0)
MCHC: 29.8 g/dL — ABNORMAL LOW (ref 30.0–36.0)
MCV: 94.3 fL (ref 80.0–100.0)
MONO ABS: 0.5 10*3/uL (ref 0.1–1.0)
Monocytes Relative: 13 %
NEUTROS ABS: 2.3 10*3/uL (ref 1.7–7.7)
Neutrophils Relative %: 57 %
Platelets: 359 10*3/uL (ref 150–400)
RBC: 4.05 MIL/uL (ref 3.87–5.11)
RDW: 23.4 % — ABNORMAL HIGH (ref 11.5–15.5)
WBC: 4 10*3/uL (ref 4.0–10.5)
nRBC: 0 % (ref 0.0–0.2)

## 2017-11-27 LAB — PROTIME-INR
INR: 0.85
Prothrombin Time: 11.5 seconds (ref 11.4–15.2)

## 2017-11-27 LAB — COMPREHENSIVE METABOLIC PANEL
ALK PHOS: 114 U/L (ref 38–126)
ALT: 20 U/L (ref 0–44)
AST: 29 U/L (ref 15–41)
Albumin: 3.5 g/dL (ref 3.5–5.0)
Anion gap: 8 (ref 5–15)
BUN: 12 mg/dL (ref 8–23)
CALCIUM: 8.8 mg/dL — AB (ref 8.9–10.3)
CO2: 29 mmol/L (ref 22–32)
CREATININE: 0.49 mg/dL (ref 0.44–1.00)
Chloride: 105 mmol/L (ref 98–111)
GFR calc non Af Amer: >60 mL/min (ref >60)
Glucose, Bld: 90 mg/dL (ref 70–99)
Potassium: 3.7 mmol/L (ref 3.5–5.1)
SODIUM: 142 mmol/L (ref 135–145)
Total Bilirubin: 0.4 mg/dL (ref 0.3–1.2)
Total Protein: 7.1 g/dL (ref 6.5–8.1)

## 2017-11-27 MED ORDER — FENTANYL CITRATE (PF) 100 MCG/2ML IJ SOLN
INTRAMUSCULAR | Status: AC
  Filled 2017-11-27: qty 2

## 2017-11-27 MED ORDER — FENTANYL CITRATE (PF) 100 MCG/2ML IJ SOLN
INTRAMUSCULAR | Status: AC | PRN
  Administered 2017-11-27 (×2): 50 ug via INTRAVENOUS

## 2017-11-27 MED ORDER — LIDOCAINE HCL (PF) 1 % IJ SOLN
INTRAMUSCULAR | Status: AC
  Filled 2017-11-27: qty 30

## 2017-11-27 MED ORDER — CLINDAMYCIN PHOSPHATE 900 MG/50ML IV SOLN
900.0000 mg | Freq: Once | INTRAVENOUS | Status: AC
  Administered 2017-11-27: 900 mg via INTRAVENOUS

## 2017-11-27 MED ORDER — MIDAZOLAM HCL 2 MG/2ML IJ SOLN
INTRAMUSCULAR | Status: AC
  Filled 2017-11-27: qty 6

## 2017-11-27 MED ORDER — MIDAZOLAM HCL 2 MG/2ML IJ SOLN
INTRAMUSCULAR | Status: AC | PRN
  Administered 2017-11-27: 1 mg via INTRAVENOUS
  Administered 2017-11-27: 0.5 mg via INTRAVENOUS
  Administered 2017-11-27: 1 mg via INTRAVENOUS

## 2017-11-27 MED ORDER — CLINDAMYCIN PHOSPHATE 900 MG/50ML IV SOLN
INTRAVENOUS | Status: AC
  Administered 2017-11-27: 900 mg via INTRAVENOUS
  Filled 2017-11-27: qty 50

## 2017-11-27 MED ORDER — SODIUM CHLORIDE 0.9 % IV SOLN
INTRAVENOUS | Status: AC | PRN
  Administered 2017-11-27: 500 mL via INTRAVENOUS

## 2017-11-27 MED ORDER — IOPAMIDOL (ISOVUE-300) INJECTION 61%
INTRAVENOUS | Status: AC
  Filled 2017-11-27: qty 50

## 2017-11-27 MED ORDER — SODIUM CHLORIDE 0.9 % IV SOLN
INTRAVENOUS | Status: DC
  Administered 2017-11-27: 13:00:00 via INTRAVENOUS

## 2017-11-27 NOTE — Progress Notes (Signed)
DISCONTINUE ON PATHWAY REGIMEN - Non-Small Cell Lung     Administer weekly:     Paclitaxel      Carboplatin   **Always confirm dose/schedule in your pharmacy ordering system**  REASON: Continuation Of Treatment PRIOR TREATMENT: MQT927: Carboplatin AUC=2 + Paclitaxel 45 mg/m2 Weekly During Radiation TREATMENT RESPONSE: Partial Response (PR)  START ON PATHWAY REGIMEN - Non-Small Cell Lung     A cycle is every 14 days:     Durvalumab   **Always confirm dose/schedule in your pharmacy ordering system**  Patient Characteristics: Stage III - Unresectable, PS = 0, 1 AJCC T Category: T3 Current Disease Status: No Distant Mets or Local Recurrence AJCC N Category: N1 AJCC M Category: M0 AJCC 8 Stage Grouping: IIIA Performance Status: PS = 0, 1 Intent of Therapy: Curative Intent, Discussed with Patient

## 2017-11-27 NOTE — Progress Notes (Signed)
[place] [phone]   [phone]  OFFICE PROGRESS NOTE  [name], NP [place] [address]  DIAGNOSIS: Stage IIb/IIIa (T3, N0/N1, M0) non-small cell lung cancer poorly differentiated squamous cell carcinoma diagnosed in August 2019.  The patient presented with large superior segment left lower lobe mass with questionable left hilar adenopathy.  PRIOR THERAPY: Concurrent chemoradiation with weekly carboplatin for AUC of 2 and paclitaxel 45 mg/M2.  Status post 7 cycles.  Last dose was given October 29, 2017 with partial response.  CURRENT THERAPY: Consolidation treatment with immunotherapy with Imfinzi 10 mg/KG every 2 weeks to start December 13, 2017.  INTERVAL HISTORY: Sherry [name] 72 y.o. female returns to the clinic today for follow-up visit accompanied by her brother.  The patient is feeling fine today with no concerning complaints.  She tolerated her course of concurrent chemoradiation fairly well.  She denied having any chest pain, shortness of breath, cough or hemoptysis.  She denied having any weight loss or night sweats.  She has no nausea, vomiting, diarrhea or constipation.  The patient had repeat CT scan of the chest performed recently and she is here for evaluation and discussion of her scan results.  MEDICAL HISTORY: Past Medical History:  Diagnosis Date  . Allergy   . Anxiety   . Arthritis   . Cavitating mass in left lower lung lobe   . Complication of anesthesia    woke up during procedure  . COPD (chronic obstructive pulmonary disease) ([place])   . Depression   . Family history of adverse reaction to anesthesia    " my mother was always hard to wake up"  . Full dentures   . Headache   . Neuromuscular disorder ([place])   . Pneumonia   . Thoracic spine fracture ([place]) 11/01/2017   T9  . Wears glasses     ALLERGIES:  is allergic to penicillins.  MEDICATIONS:  Current Outpatient Medications    Medication Sig Dispense Refill  . guaifenesin (ROBITUSSIN) 100 MG/5ML syrup Take by mouth daily.    . Menthol-Ascorbic Acid (LUDENS COUGH DROPS MT) Use as directed 1 lozenge in the mouth or throat 3 (three) times daily.     . Multiple Vitamin (MULTIVITAMIN WITH MINERALS) TABS tablet Take 1 tablet by mouth daily.    Marland Kitchen acetaminophen (TYLENOL 8 HOUR) 650 MG CR tablet Take 1 tablet (650 mg total) by mouth every 8 (eight) hours as needed for pain or fever. (Patient not taking: Reported on 11/27/2017) 30 tablet 0  . cyanocobalamin (,VITAMIN B-12,) 1000 MCG/ML injection Inject 1,000 mcg into the muscle every 30 (thirty) days. Administered by Dr. Eldridge Abrahams    . nystatin cream (MYCOSTATIN) Apply 1 application topically 2 (two) times daily as needed for dry skin. (Patient not taking: Reported on 11/27/2017) 15 g 0   No current facility-administered medications for this visit.     SURGICAL HISTORY:  Past Surgical History:  Procedure Laterality Date  . ELBOW SURGERY     for  "Tennis Elbow"  . hand surgery    . IR RADIOLOGIST EVAL & MGMT  11/20/2017  . MULTIPLE TOOTH EXTRACTIONS    . TUBAL LIGATION    . VIDEO BRONCHOSCOPY N/A 08/31/2017   Procedure: VIDEO BRONCHOSCOPY;  Surgeon: Melrose Nakayama, MD;  [facility] OR;  Service: Thoracic;  Laterality: N/A;    REVIEW OF SYSTEMS:  Constitutional: positive for fatigue Eyes: negative Ears,  nose, mouth, throat, and face: negative Respiratory: positive for cough Cardiovascular: negative Gastrointestinal: negative Genitourinary:negative Integument/breast: negative Hematologic/lymphatic: negative Musculoskeletal:negative Neurological: negative Behavioral/Psych: negative Endocrine: negative Allergic/Immunologic: negative   PHYSICAL EXAMINATION: General appearance: alert, cooperative and no distress Head: Normocephalic, without obvious abnormality, atraumatic Neck: no adenopathy, no JVD, supple, symmetrical, trachea midline and thyroid not  enlarged, symmetric, no tenderness/mass/nodules Lymph nodes: Cervical, supraclavicular, and axillary nodes normal. Resp: clear to auscultation bilaterally Back: symmetric, no curvature. ROM normal. No CVA tenderness. Cardio: regular rate and rhythm, S1, S2 normal, no murmur, click, rub or gallop GI: soft, non-tender; bowel sounds normal; no masses,  no organomegaly Extremities: extremities normal, atraumatic, no cyanosis or edema Neurologic: Alert and oriented X 3, normal strength and tone. Normal symmetric reflexes. Normal coordination and gait  ECOG PERFORMANCE STATUS: 1 - Symptomatic but completely ambulatory  Blood pressure (!) 118/59, pulse 76, temperature 98 F (36.7 C), temperature source Oral, resp. rate 19, SpO2 97 %.  LABORATORY DATA: Lab Results  Component Value Date   WBC 4.6 11/22/2017   HGB 10.7 (L) 11/22/2017   HCT 35.6 (L) 11/22/2017   MCV 92.5 11/22/2017   PLT 274 11/22/2017      Chemistry      Component Value Date/Time   NA 143 11/22/2017 0933   K 3.6 11/22/2017 0933   CL 106 11/22/2017 0933   CO2 30 11/22/2017 0933   BUN 13 11/22/2017 0933   CREATININE 0.68 11/22/2017 0933      Component Value Date/Time   CALCIUM 8.9 11/22/2017 0933   ALKPHOS 131 (H) 11/22/2017 0933   AST 23 11/22/2017 0933   ALT 14 11/22/2017 0933   BILITOT 0.3 11/22/2017 0933       RADIOGRAPHIC STUDIES: Ct Chest W Contrast  Result Date: 11/22/2017 CLINICAL DATA:  Lung cancer.  Chemo radiation. EXAM: CT CHEST WITH CONTRAST TECHNIQUE: Multidetector CT imaging of the chest was performed during intravenous contrast administration. CONTRAST:  41mL OMNIPAQUE IOHEXOL 300 MG/ML  SOLN COMPARISON:  CT thoracic spine 11/01/2017 and CT chest 09/16/2017, 07/13/2017. FINDINGS: Cardiovascular: Atherosclerotic calcification of the arterial vasculature. Heart size normal. No pericardial effusion. Mediastinum/Nodes: Mediastinal and hilar lymph nodes are not enlarged by CT size criteria. No axillary  adenopathy. Esophagus is grossly unremarkable. Lungs/Pleura: Centrilobular emphysema. 7 mm right lower lobe nodule (series 5, image 71), unchanged. Interval improvement in size of a left lower lobe mass with relatively amorphous residual consolidation in the central left lower lobe. A discrete mass is difficult to measure. Overall volume loss in the left lower lobe with interstitial thickening/nodularity. Small to moderate loculated left pleural effusion. Airway is unremarkable. Upper Abdomen: Visualized portions of the liver, adrenal glands, kidneys and spleen are unremarkable. 3 mm low-attenuation lesion in body of the pancreas, as on 07/13/2017. Visualized portion of the stomach is unremarkable. No upper abdominal adenopathy. Musculoskeletal: No worrisome lytic or sclerotic lesions. T9 compression fracture better evaluated on 11/01/2017. IMPRESSION: 1. Interval response to therapy as evidenced by the reduction in size of a left lower lobe mass. Residual left lower lobe consolidation is difficult to measure. Associated interstitial thickening and nodularity in the peripheral left lower lobe, possibly postobstructive in etiology. Difficult to exclude lymphangitic carcinomatosis. 2. Small to moderate loculated left pleural effusion. 3. Right lower lobe nodule, stable. 4. Tiny low-attenuation lesion in the body of the pancreas, stable 07/13/2017. Continued attention on follow-up exams is suggested. 5.  Aortic atherosclerosis (ICD10-170.0). 6.  Emphysema (ICD10-J43.9). Electronically Signed   By: Lorin Picket M.D.  On: 11/22/2017 15:51   Ct Thoracic Spine W Contrast  Result Date: 11/01/2017 CLINICAL DATA:  Mid back pains. Pt has hx of lung cancer. eval for possible mets vx compression fx. Mid-low back pain which radiates into the right flank over the past 3 weeks and progressively worsening. She has completed 32 of 33 planned radiation treatments to her left lung. Her pain became so severe yesterday that she  was seen in the symptom management clinic and prescribed muscle relaxers. The Robaxin did not provide relief so she presented to the ED last night but refused labs or imaging. temporary pain relief with the combination of Robaxin and Vicodin but came around to the clinic following her treatment today complaining of persistent pain. On exam, she is point tender to palpate over the levels of T10-T12 and into the paravertebral muscles on the right flank. She denies pain into the buttocks or LEs. She has longstanding numbness/tingling bilateral feet for the better part of 6 months and unchanged recently. " EXAM: CT THORACIC SPINE WITH CONTRAST TECHNIQUE: Multidetector CT images of thoracic was performed according to the standard protocol following intravenous contrast administration. CONTRAST:  26mL OMNIPAQUE IOHEXOL 300 MG/ML  SOLN COMPARISON:  09/16/2017 and previous FINDINGS: Alignment: Normal Vertebrae: Mild superior endplate compression deformity of T9, new since 09/16/2017. There is some mild sclerosis in the vertebral body inferior to the superior endplate. No discrete lytic or sclerotic lesion. No paraspinal mass or hematoma. Posterior elements intact. No bony retropulsion into the spinal canal. Minimal anterior endplate spurring at multiple levels in the mid and lower thoracic spine as before. Paraspinal and other soft tissues: Persistent left pleural effusion. Trace right pleural effusion seen previously has resolved. Consolidation/atelectasis in the left lower lobe. Some decrease in left infrahilar mass with central cavitation. Emphysematous change in the visualized right lung and left upper lung. Unremarkable vascular enhancement. No paraspinal mass. Disc levels: Preserved throughout. IMPRESSION: 1. Acute/subacute mild T9 compression fracture deformity, without complicating features. 2. No paraspinal mass, osseous destruction, or other suggestion of osseous metastatic disease. 3. Interval decrease in size of  left infrahilar mass, with new central cavitation. 4. Persistent left pleural effusion. Electronically Signed   By: Lucrezia Europe M.D.   On: 11/01/2017 21:24   Ir Radiologist Eval & Mgmt  Result Date: 11/20/2017 Please refer to notes tab for details about interventional procedure. (Op Note)   ASSESSMENT AND PLAN: This is a very pleasant 72 years old white female with a stage IIIa non-small cell lung cancer, poorly differentiated squamous cell carcinoma.   The patient underwent a course of concurrent chemoradiation with weekly carboplatin and paclitaxel status post 7 cycles with partial response. She had repeat CT scan of the chest performed recently.  I personally and independently reviewed the scan images and discussed the result and showed the images to the patient and her brother. Her scan showed significant improvement of her disease. I discussed with the patient her treatment options including continuous observation versus treatment with consolidation immunotherapy with Imfinzi 10 mg/KG every 2 weeks.  I discussed with the patient the adverse effect of the immunotherapy including but not limited to immunotherapy mediated skin rash, diarrhea, inflammation of the lung, kidney, liver, thyroid or other endocrine dysfunction. The patient is interested in proceeding with immunotherapy and she is expected to start the first cycle of this treatment on December 13, 2017. I will see her back for follow-up visit in 4 weeks for evaluation with the start of cycle #2. For the  compression fracture, she is scheduled to have vertebroplasty later today. The patient was advised to call immediately if she has any concerning symptoms in the interval. The patient voices understanding of current disease status and treatment options and is in agreement with the current care plan.  All questions were answered. The patient knows to call the clinic with any problems, questions or concerns. We can certainly see the patient  much sooner if necessary.  I spent 15 minutes counseling the patient face to face. The total time spent in the appointment was 25 minutes.  Disclaimer: This note was dictated with voice recognition software. Similar sounding words can inadvertently be transcribed and may not be corrected upon review.

## 2017-11-27 NOTE — Discharge Instructions (Signed)
Balloon Kyphoplasty, Care After Refer to this sheet in the next few weeks. These instructions provide you with information about caring for yourself after your procedure. Your health care provider may also give you more specific instructions. Your treatment has been planned according to current medical practices, but problems sometimes occur. Call your health care provider if you have any problems or questions after your procedure. What can I expect after the procedure? After your procedure, it is common to have back pain. Follow these instructions at home: Incision care  Follow instructions from your health care provider about how to take care of your incisions. Make sure you: ? Wash your hands with soap and water before you change your bandage (dressing). If soap and water are not available, use hand sanitizer. ? Change your dressing as told by your health care provider. ? Leave stitches (sutures), skin glue, or adhesive strips in place. These skin closures may need to be in place for 2 weeks or longer. If adhesive strip edges start to loosen and curl up, you may trim the loose edges. Do not remove adhesive strips completely unless your health care provider tells you to do that.  Check your incision area every day for signs of infection. Watch for: ? Redness, swelling, or pain. ? Fluid, blood, or pus.  Keep your dressing dry until your health care provider says that it can be removed. Activity   Rest your back and avoid intense physical activity for as long as told by your health care provider.  Return to your normal activities as told by your health care provider. Ask your health care provider what activities are safe for you.  Do not lift anything that is heavier than 10 lb (4.5 kg). This is about the weight of a gallon of milk.You may need to avoid heavy lifting for several weeks. General instructions  Take over-the-counter and prescription medicines only as told by your health care  provider.  If directed, apply ice to the painful area: ? Put ice in a plastic bag. ? Place a towel between your skin and the bag. ? Leave the ice on for 20 minutes, 2-3 times per day.  Do not use tobacco products, including cigarettes, chewing tobacco, or e-cigarettes. If you need help quitting, ask your health care provider.  Keep all follow-up visits as told by your health care provider. This is important. Contact a health care provider if:  You have a fever.  You have redness, swelling, or pain at the site of your incisions.  You have fluid, blood, or pus coming from your incisions.  You have pain that gets worse or does not get better with medicine.  You develop numbness or weakness in any part of your body. Get help right away if:  You have chest pain.  You have difficulty breathing.  You cannot move your legs.  You cannot control your bladder or bowel movements.  You suddenly become weak or numb on one side of your body.  You become very confused.  You have trouble speaking or understanding, or both. This information is not intended to replace advice given to you by your health care provider. Make sure you discuss any questions you have with your health care provider. Document Released: 09/16/2014 Document Revised: 06/03/2015 Document Reviewed: 04/20/2014 Elsevier Interactive Patient Education  2018 [employer] [place]. Moderate Conscious Sedation, Adult, Care After These instructions provide you with information about caring for yourself after your procedure. Your health care provider may also give you  more specific instructions. Your treatment has been planned according to current medical practices, but problems sometimes occur. Call your health care provider if you have any problems or questions after your procedure. What can I expect after the procedure? After your procedure, it is common:  To feel sleepy for several hours.  To feel clumsy and have poor balance for  several hours.  To have poor judgment for several hours.  To vomit if you eat too soon.  Follow these instructions at home: For at least 24 hours after the procedure:   Do not: ? Participate in activities where you could fall or become injured. ? Drive. ? Use heavy machinery. ? Drink alcohol. ? Take sleeping pills or medicines that cause drowsiness. ? Make important decisions or sign legal documents. ? Take care of children on your own.  Rest. Eating and drinking  Follow the diet recommended by your health care provider.  If you vomit: ? Drink water, juice, or soup when you can drink without vomiting. ? Make sure you have little or no nausea before eating solid foods. General instructions  Have a responsible adult stay with you until you are awake and alert.  Take over-the-counter and prescription medicines only as told by your health care provider.  If you smoke, do not smoke without supervision.  Keep all follow-up visits as told by your health care provider. This is important. Contact a health care provider if:  You keep feeling nauseous or you keep vomiting.  You feel light-headed.  You develop a rash.  You have a fever. Get help right away if:  You have trouble breathing. This information is not intended to replace advice given to you by your health care provider. Make sure you discuss any questions you have with your health care provider. Document Released: 10/16/2012 Document Revised: 05/31/2015 Document Reviewed: 04/17/2015 Elsevier Interactive Patient Education  [employer].

## 2017-11-27 NOTE — H&P (Signed)
Referring Physician(s): Bruning,A,PA-C  Supervising Physician: Arne Cleveland  Patient Status:  [facility] OP  Chief Complaint: Back pain, T9 fracture, lung cancer   Subjective: Patient familiar to IR service from recent consultation with Dr. Vernard Gambles on 11/20/2017 to discuss treatment options for new symptomatic T9 compression fracture.  She is a prior smoker and has a known history of left lung carcinoma, status post radiation and chemotherapy and currently on immunotherapy.  She now presents with persistent mid to low back pain with radiation to right flank and minimal improvement with pain medication.  Recent imaging has revealed an acute/subacute T9 compression fracture without complicating features.  She was deemed an appropriate candidate for T9 kyphoplasty and biopsy and presents today for the procedure.  She currently denies fever, headache, chest pain, worsening dyspnea, abdominal pain, vomiting or bleeding.  She does have occasional nausea and coughing.  Past Medical History:  Diagnosis Date  . Allergy   . Anxiety   . Arthritis   . Cavitating mass in left lower lung lobe   . Complication of anesthesia    woke up during procedure  . COPD (chronic obstructive pulmonary disease) ([place])   . Depression   . Family history of adverse reaction to anesthesia    " my mother was always hard to wake up"  . Full dentures   . Headache   . Neuromuscular disorder ([place])   . Pneumonia   . Thoracic spine fracture ([place]) 11/01/2017   T9  . Wears glasses    Past Surgical History:  Procedure Laterality Date  . ELBOW SURGERY     for  "Tennis Elbow"  . hand surgery    . IR RADIOLOGIST EVAL & MGMT  11/20/2017  . MULTIPLE TOOTH EXTRACTIONS    . TUBAL LIGATION    . VIDEO BRONCHOSCOPY N/A 08/31/2017   Procedure: VIDEO BRONCHOSCOPY;  Surgeon: Melrose Nakayama, MD;  Location: [facility] OR;  Service: Thoracic;  Laterality: N/A;      Allergies: Penicillins  Medications: Prior to Admission  medications   Medication Sig Start Date End Date Taking? Authorizing Provider  acetaminophen (TYLENOL 8 HOUR) 650 MG CR tablet Take 1 tablet (650 mg total) by mouth every 8 (eight) hours as needed for pain or fever. 10/30/17   Varney Biles, MD  cyanocobalamin (,VITAMIN B-12,) 1000 MCG/ML injection Inject 1,000 mcg into the muscle every 30 (thirty) days. Administered by Dr. Eldridge Abrahams    [provider]  guaifenesin (ROBITUSSIN) 100 MG/5ML syrup Take by mouth daily.    [provider]  Menthol-Ascorbic Acid (LUDENS COUGH DROPS MT) Use as directed 1 lozenge in the mouth or throat 3 (three) times daily.     [provider]  Multiple Vitamin (MULTIVITAMIN WITH MINERALS) TABS tablet Take 1 tablet by mouth daily.    [provider]  nystatin cream (MYCOSTATIN) Apply 1 application topically 2 (two) times daily as needed for dry skin. Patient not taking: Reported on 11/27/2017 10/22/17   Maryanna Shape, NP     Vital Signs: BP (!) 124/52 (BP Location: Right Arm)   Pulse 87   Temp 98.2 F (36.8 C) (Oral)   Resp 18   SpO2 95%   Physical Exam awake, alert.  Chest with diminished breath sounds bases, heart with regular rate and rhythm.  Abdomen soft, positive bowel sounds, nontender, no lower extremity edema; paravertebral tenderness at T9 region with some radiation to right flank  Imaging: No results found.  Labs:  CBC: Recent  Labs    10/15/17 0748 10/22/17 1202 10/29/17 0847 11/22/17 0933  WBC 3.5* 3.6* 2.5* 4.6  HGB 10.0* 9.8* 10.1* 10.7*  HCT 31.8* 32.0* 33.2* 35.6*  PLT 330 296 286 274    COAGS: Recent Labs    08/31/17 1142  INR 1.04  APTT 32    BMP: Recent Labs    10/15/17 0748 10/22/17 1203 10/29/17 1009 11/22/17 0933  NA 143 142 141 143  K 3.6 3.8 3.7 3.6  CL 106 104 105 106  CO2 28 29 28 30   GLUCOSE 81 88 80 92  BUN 10 12 12 13   CALCIUM 8.8* 9.0 8.9 8.9  CREATININE 0.62 0.61 0.62 0.68  GFRNONAA >60 >60 >60 >60    GFRAA >60 >60 >60 >60    LIVER FUNCTION TESTS: Recent Labs    10/15/17 0748 10/22/17 1203 10/29/17 1009 11/22/17 0933  BILITOT 0.3 0.3 0.3 0.3  AST 14* 19 22 23   ALT 9 12 12 14   ALKPHOS 96 101 106 131*  PROT 6.4* 6.3* 6.4* 6.9  ALBUMIN 2.8* 2.9* 2.9* 3.1*    Assessment and Plan: 72 yo female,prior smoker, with known history of left lung carcinoma, status post radiation and chemotherapy and currently on immunotherapy.  She now presents with persistent mid to low back pain with radiation to right flank and minimal improvement with pain medication.  Recent imaging has revealed an acute/subacute T9 compression fracture without complicating features.  Following recent consultation with Dr. Vernard Gambles on 11/20/2017  she was deemed an appropriate candidate for T9 kyphoplasty and biopsy and presents today for the procedures. Risks and benefits of procedure were discussed with the patient including, but not limited to education regarding the natural healing process of compression fractures without intervention, bleeding, infection, cement migration which may cause spinal cord damage, paralysis, pulmonary embolism or even death.  This interventional procedure involves the use of X-rays and because of the nature of the planned procedure, it is possible that we will have prolonged use of X-ray fluoroscopy.  Potential radiation risks to you include (but are not limited to) the following: - A slightly elevated risk for cancer  several years later in life. This risk is typically less than 0.5% percent. This risk is low in comparison to the normal incidence of human cancer, which is 33% for women and 50% for men according to the [employer]. - Radiation induced injury can include skin redness, resembling a rash, tissue breakdown / ulcers and hair loss (which can be temporary or permanent).   The likelihood of either of these occurring depends on the difficulty of the procedure and whether  you are sensitive to radiation due to previous procedures, disease, or genetic conditions.   IF your procedure requires a prolonged use of radiation, you will be notified and given written instructions for further action.  It is your responsibility to monitor the irradiated area for the 2 weeks following the procedure and to notify your physician if you are concerned that you have suffered a radiation induced injury.    All of the patient's questions were answered, patient is agreeable to proceed.  Consent signed and in chart.  LABS PENDING    Electronically Signed: D. Rowe Robert, PA-C 11/27/2017, 12:20 PM   I spent a total of 25 minutes at the the patient's bedside AND on the patient's hospital floor or unit, greater than 50% of which was counseling/coordinating care for T9 kyphoplasty/biopsy

## 2017-11-27 NOTE — Procedures (Signed)
  Procedure: T9 KP and core biopsy   EBL:   minimal Complications:  none immediate  See full dictation in [facility].  Dillard Cannon MD Main # [phone] Pager  [phone]

## 2017-11-27 NOTE — Telephone Encounter (Signed)
Printed avs and calender of upcoming appointment. Per 11/19 los

## 2017-11-28 ENCOUNTER — Telehealth: Payer: Self-pay

## 2017-11-28 NOTE — Telephone Encounter (Signed)
Patient called to report being in a lot of pain after her Kyphoplasty yesterday.  She states she was told she would be pain-free after the KP.  She is taking Acetaminophen prn and utilizing bedrest to deal with the pain.  I told her that the KP procedure puts a bone cement mixture in the fractured vertebral body to stabilize the fracture, much like a cast on the wrist of a wrist fracture.  Both the external cast and our internal bone cement mixture help to stabilize the fracture while to bone heals.  This process can take several weeks and, unfortunately, some pain is to be expected while the bone heals.  Patient states an understanding of this and will continue taking Acetaminophen and using heat and/or ice.  Gypsy Lore, RN

## 2017-11-29 ENCOUNTER — Ambulatory Visit: Payer: PPO | Admitting: Oncology

## 2017-11-30 DIAGNOSIS — M810 Age-related osteoporosis without current pathological fracture: Secondary | ICD-10-CM | POA: Diagnosis not present

## 2017-11-30 DIAGNOSIS — R0981 Nasal congestion: Secondary | ICD-10-CM | POA: Diagnosis not present

## 2017-11-30 DIAGNOSIS — C3432 Malignant neoplasm of lower lobe, left bronchus or lung: Secondary | ICD-10-CM | POA: Diagnosis not present

## 2017-11-30 DIAGNOSIS — E538 Deficiency of other specified B group vitamins: Secondary | ICD-10-CM | POA: Diagnosis not present

## 2017-11-30 DIAGNOSIS — R634 Abnormal weight loss: Secondary | ICD-10-CM | POA: Diagnosis not present

## 2017-12-04 ENCOUNTER — Other Ambulatory Visit (HOSPITAL_COMMUNITY): Payer: Self-pay | Admitting: Interventional Radiology

## 2017-12-04 DIAGNOSIS — S22000A Wedge compression fracture of unspecified thoracic vertebra, initial encounter for closed fracture: Secondary | ICD-10-CM

## 2017-12-05 ENCOUNTER — Ambulatory Visit
Admission: RE | Admit: 2017-12-05 | Discharge: 2017-12-05 | Disposition: A | Discharge status: Home / Self Care | Payer: PPO | Source: Ambulatory Visit | Attending: Urology | Admitting: Urology

## 2017-12-05 ENCOUNTER — Encounter: Payer: Self-pay | Admitting: Internal Medicine

## 2017-12-05 ENCOUNTER — Encounter: Payer: Self-pay | Admitting: Urology

## 2017-12-05 ENCOUNTER — Other Ambulatory Visit: Payer: Self-pay

## 2017-12-05 VITALS — BP 119/60 | HR 81 | Temp 98.3°F | Resp 18 | Wt 74.4 lb

## 2017-12-05 DIAGNOSIS — C3432 Malignant neoplasm of lower lobe, left bronchus or lung: Secondary | ICD-10-CM | POA: Insufficient documentation

## 2017-12-05 DIAGNOSIS — Z9221 Personal history of antineoplastic chemotherapy: Secondary | ICD-10-CM | POA: Insufficient documentation

## 2017-12-05 DIAGNOSIS — Z79899 Other long term (current) drug therapy: Secondary | ICD-10-CM | POA: Insufficient documentation

## 2017-12-05 NOTE — Progress Notes (Signed)
And inbox request was sent to Abelina Bachelor, RN with Dr. Earlie Server, regarding the patient's request for information on how and where to purchase a wig.  The patient requests this information be mailed to her home address.  Nicholos Johns, MMS, PA-C [facility] [place]: [phone]  Fax: [phone]

## 2017-12-05 NOTE — Progress Notes (Signed)
[facility] called regarding patient wanting assistance with bills. Advised Ailene Ravel and Enis Gash are the advocates for [facility].  Went down to [facility] to leave my card for patient to bring proof of income on 12/14/17 to apply for the one-time $700 [place].

## 2017-12-05 NOTE — Progress Notes (Signed)
Radiation Oncology         [phone] ________________________________  Name: NHUNG DANKO MRN: [id number]  Date: 12/05/2017  DOB: 06-26-1945  Post Treatment Note  CC: [name], NP  [name], NP  Diagnosis:   72 yo woman withcT3 N0 M0 squamous cell carcinoma of theleft lower lung     Interval Since Last Radiation:  5 weeks  09/17/17 - 10/31/17:   The primary tumor and involved mediastinal adenopathy were treated to 66 Gy in 33 fractions of 2 Gy; concurrent with chemotherapy.  Narrative:  The patient returns today for routine follow-up.  She tolerated radiation treatment relatively well with mild-moderate fatigue.  She denied chest pain, shortness of breath, dysphagia, weight loss, or hemoptysis throughout treatment. She reported a cough with clear-white sputum towards the middle of treatments and experienced increased hyperpigmentation to her back and chest with an episode of dry itchy desquamation that was relieved with Radiaplex gel.   Early into her treatments she began complaining of severe mid-back pain that was making lying flat for treatments very difficult.  She is unable to tolerate MRI scans due to severe claustrophobia despite pre-medication so she had a CT T-spine which showed an acute/subacute mild T9 compression fracture deformity, without complicating features. There were no paraspinal masses, osseous destruction, or other suggestion of osseous metastatic disease. Her pain was not well controlled with narcotic pain medications and reduced activity so she was eventually referred to IR for consideration of kyphoplasty as she had failed conservative management.  She was evaluated with Dr. Vernard Gambles on 11/20/17 and felt to be a good candidate for kyphoplasty which was performed on 11/27/17.                              On review of systems, the patient states that she is doing relatively well overall.  She specifically denies chest pain, increased shortness of breath,  productive cough, hemoptysis, fever, chills or night sweats.  In fact, she feels that her breathing has improved overall since completing radiation.  She also reports gradual improvement in her back pain since the time of her kyphoplasty procedure on 11/27/2017.  She reports a healthy appetite but is discouraged regarding the slow progress she is making in regaining her weight despite eating very well.  She denies abdominal pain, nausea, vomiting or diarrhea although she does admit that her bowels run loose most of the time which is not anything new.  She had a recent repeat CT chest on 11/22/2017 prior to her follow-up visit with Dr. Earlie Server on 11/27/2017.  CT scan shows an interval response to treatment with a significant reduction in the size of the left lower lobe mass.  She is planning to start consolidation treatment with immunotherapy with Imfinzi on December 13, 2017.  ALLERGIES:  is allergic to penicillins.  Meds: Current Outpatient Medications  Medication Sig Dispense Refill  . acetaminophen (TYLENOL 8 HOUR) 650 MG CR tablet Take 1 tablet (650 mg total) by mouth every 8 (eight) hours as needed for pain or fever. 30 tablet 0  . cyanocobalamin (,VITAMIN B-12,) 1000 MCG/ML injection Inject 1,000 mcg into the muscle every 30 (thirty) days. Administered by Dr. Eldridge Abrahams    . guaifenesin (ROBITUSSIN) 100 MG/5ML syrup Take by mouth daily.    . Menthol-Ascorbic Acid (LUDENS COUGH DROPS MT) Use as directed 1 lozenge in the mouth or throat 3 (three) times daily.     Marland Kitchen  Multiple Vitamin (MULTIVITAMIN WITH MINERALS) TABS tablet Take 1 tablet by mouth daily.    Marland Kitchen nystatin cream (MYCOSTATIN) Apply 1 application topically 2 (two) times daily as needed for dry skin. (Patient not taking: Reported on 11/27/2017) 15 g 0   No current facility-administered medications for this encounter.     Physical Findings:  weight is 74 lb 6 oz (33.7 kg). Her oral temperature is 98.3 F (36.8 C). Her blood pressure is  119/60 and her pulse is 81. Her respiration is 18 and oxygen saturation is 94%.   /10 In general this is a well appearing Caucasian female in no acute distress.  She's alert and oriented x4 and appropriate throughout the examination. Cardiopulmonary assessment is negative for acute distress and she exhibits normal effort.   Lab Findings: Lab Results  Component Value Date   WBC 4.0 11/27/2017   HGB 11.4 (L) 11/27/2017   HCT 38.2 11/27/2017   MCV 94.3 11/27/2017   PLT 359 11/27/2017     Radiographic Findings: Ct Chest W Contrast  Result Date: 11/22/2017 CLINICAL DATA:  Lung cancer.  Chemo radiation. EXAM: CT CHEST WITH CONTRAST TECHNIQUE: Multidetector CT imaging of the chest was performed during intravenous contrast administration. CONTRAST:  43mL OMNIPAQUE IOHEXOL 300 MG/ML  SOLN COMPARISON:  CT thoracic spine 11/01/2017 and CT chest 09/16/2017, 07/13/2017. FINDINGS: Cardiovascular: Atherosclerotic calcification of the arterial vasculature. Heart size normal. No pericardial effusion. Mediastinum/Nodes: Mediastinal and hilar lymph nodes are not enlarged by CT size criteria. No axillary adenopathy. Esophagus is grossly unremarkable. Lungs/Pleura: Centrilobular emphysema. 7 mm right lower lobe nodule (series 5, image 71), unchanged. Interval improvement in size of a left lower lobe mass with relatively amorphous residual consolidation in the central left lower lobe. A discrete mass is difficult to measure. Overall volume loss in the left lower lobe with interstitial thickening/nodularity. Small to moderate loculated left pleural effusion. Airway is unremarkable. Upper Abdomen: Visualized portions of the liver, adrenal glands, kidneys and spleen are unremarkable. 3 mm low-attenuation lesion in body of the pancreas, as on 07/13/2017. Visualized portion of the stomach is unremarkable. No upper abdominal adenopathy. Musculoskeletal: No worrisome lytic or sclerotic lesions. T9 compression fracture better  evaluated on 11/01/2017. IMPRESSION: 1. Interval response to therapy as evidenced by the reduction in size of a left lower lobe mass. Residual left lower lobe consolidation is difficult to measure. Associated interstitial thickening and nodularity in the peripheral left lower lobe, possibly postobstructive in etiology. Difficult to exclude lymphangitic carcinomatosis. 2. Small to moderate loculated left pleural effusion. 3. Right lower lobe nodule, stable. 4. Tiny low-attenuation lesion in the body of the pancreas, stable 07/13/2017. Continued attention on follow-up exams is suggested. 5.  Aortic atherosclerosis (ICD10-170.0). 6.  Emphysema (ICD10-J43.9). Electronically Signed   By: Lorin Picket M.D.   On: 11/22/2017 15:51   Ir Kypho Thoracic With Bone Biopsy  Result Date: 11/27/2017 CLINICAL DATA:  Subacute painful T9 compression fracture deformity. History of lung carcinoma. EXAM: KYPHOPLASTY AT THORACIC T9 DEEP CORE BONE BIOPSY UNDER FLUOROSCOPY TECHNIQUE: The procedure, risks (including but not limited to bleeding, infection, organ damage), benefits, and alternatives were explained to the patient. Questions regarding the procedure were encouraged and answered. The patient understands and consents to the procedure. The patient was placed prone on the fluoroscopic table. The skin overlying the midthoracic region was then prepped and draped in the usual sterile fashion. Maximal barrier sterile technique was utilized including caps, mask, sterile gowns, sterile gloves, sterile drape, hand hygiene and skin antiseptic.  Intravenous Fentanyl and Versed were administered as conscious sedation during continuous cardiorespiratory monitoring by the radiology RN, with a total moderate sedation time of 36 minutes. As antibiotic prophylaxis, Cleocin 900 mg was ordered pre-procedure and administered intravenously within !one hour! of incision. The T9 level was confirmed with counting from caudal and cranial approaches.  The right pedicle at thoracic T9 was then infiltrated with 1% lidocaine followed by the advancement of a Kyphon trocar needle through the right pedicle into the posterior one-third. The trocar was removed coaxial core sample obtained and submitted to surgical pathology. The osteo drill was advanced to the anterior third of the vertebral body. The osteo drill was retracted. Through the working cannula, a Kyphon inflatable bone tamp 15 x 2 was advanced and positioned with the distal marker 5 mm from the anterior aspect of the cortex. Crossing of the midline was not seen on the AP projection. At this time, the balloon was expanded using contrast via a Kyphon inflation syringe device via micro tubing. In similar fashion, the left pedicle was infiltrated with 1% lidocaine followed by the advancement of a second Kyphon trocar needle through the left pedicle into the posterior third of the vertebral body. Core biopsy samples obtained. The osteo drill was coaxially advanced to the anterior right third. The osteo drill was exchanged for a Kyphon inflatable bone tamp 15 x 2, advanced to within 5 mm of the anterior aspect of the cortex. The balloon was then expanded using contrast as above. Inflations were continued until there was near apposition across the midline and with the superior and the inferior end plates. At this time, methylmethacrylate mixture was reconstituted in the Kyphon bone mixing device system. This was then loaded into the delivery mechanism, attached to Kyphon bone fillers. The balloons were deflated and removed followed by the instillation of methylmethacrylate mixture with excellent filling in the AP and lateral projections. No extravasation was noted in the disk spaces or posteriorly into the spinal canal. No epidural venous contamination was seen. The patient tolerated the procedure well. There were no acute complications. The working cannulae and the bone filler were then retrieved and removed.  COMPLICATIONS: COMPLICATIONS None immediate. IMPRESSION: 1. Status post vertebral body augmentation using balloon kyphoplasty at thoracic T9 as described without event. 2. Technically successful deep core bone biopsy of T9 vertebral body, submitted to surgical pathology. Electronically Signed   By: Lucrezia Europe M.D.   On: 11/27/2017 15:13   Ir Radiologist Eval & Mgmt  Result Date: 11/20/2017 Please refer to notes tab for details about interventional procedure. (Op Note)   Impression/Plan: 22. 72 yo woman withcT3 N0 M0 squamous cell carcinoma of theleft lower lung. She appears to have recovered well from the effects of her recent radiotherapy and is currently without complaints.  She has also had significant improvement in her back pain since the time of her kyphoplasty procedure.  A recent repeat CT chest on 11/22/2017 showed an interval response to treatment with a significant reduction in the size of the left lower lobe mass.  She is planning to start consolidation treatment with immunotherapy with Imfinzi on December 13, 2017 under the care and direction of Dr. Julien Nordmann. We discussed that while we are happy to continue to participate in her care if clinically indicated, at this point, we will plan to see her back on an as-needed basis.     She will continue in routine follow-up with Dr. Earlie Server for continued disease management.  She knows to call  at anytime with any questions or concerns related to her previous radiotherapy.  She is comfortable with and in agreement with this plan.    Nicholos Johns, PA-C

## 2017-12-11 ENCOUNTER — Encounter (HOSPITAL_COMMUNITY): Payer: Self-pay | Admitting: Emergency Medicine

## 2017-12-11 ENCOUNTER — Telehealth: Payer: Self-pay | Admitting: Medical Oncology

## 2017-12-11 ENCOUNTER — Emergency Department (HOSPITAL_COMMUNITY)
Admission: EM | Admit: 2017-12-11 | Discharge: 2017-12-11 | Disposition: A | Discharge status: Home / Self Care | Payer: PPO | Attending: Emergency Medicine | Admitting: Emergency Medicine

## 2017-12-11 ENCOUNTER — Emergency Department (HOSPITAL_COMMUNITY): Payer: PPO

## 2017-12-11 ENCOUNTER — Telehealth: Payer: Self-pay | Admitting: Radiation Oncology

## 2017-12-11 ENCOUNTER — Telehealth: Payer: Self-pay

## 2017-12-11 ENCOUNTER — Other Ambulatory Visit: Payer: Self-pay

## 2017-12-11 DIAGNOSIS — R0789 Other chest pain: Secondary | ICD-10-CM | POA: Diagnosis not present

## 2017-12-11 DIAGNOSIS — J449 Chronic obstructive pulmonary disease, unspecified: Secondary | ICD-10-CM | POA: Diagnosis not present

## 2017-12-11 DIAGNOSIS — Z87891 Personal history of nicotine dependence: Secondary | ICD-10-CM | POA: Insufficient documentation

## 2017-12-11 DIAGNOSIS — M62830 Muscle spasm of back: Secondary | ICD-10-CM | POA: Diagnosis not present

## 2017-12-11 DIAGNOSIS — R51 Headache: Secondary | ICD-10-CM | POA: Insufficient documentation

## 2017-12-11 DIAGNOSIS — R110 Nausea: Secondary | ICD-10-CM | POA: Insufficient documentation

## 2017-12-11 DIAGNOSIS — Z9221 Personal history of antineoplastic chemotherapy: Secondary | ICD-10-CM | POA: Insufficient documentation

## 2017-12-11 DIAGNOSIS — J9811 Atelectasis: Secondary | ICD-10-CM | POA: Diagnosis not present

## 2017-12-11 DIAGNOSIS — R0781 Pleurodynia: Secondary | ICD-10-CM | POA: Diagnosis not present

## 2017-12-11 DIAGNOSIS — M546 Pain in thoracic spine: Secondary | ICD-10-CM | POA: Diagnosis not present

## 2017-12-11 DIAGNOSIS — M545 Low back pain: Secondary | ICD-10-CM | POA: Diagnosis present

## 2017-12-11 DIAGNOSIS — Z85118 Personal history of other malignant neoplasm of bronchus and lung: Secondary | ICD-10-CM | POA: Diagnosis not present

## 2017-12-11 DIAGNOSIS — J90 Pleural effusion, not elsewhere classified: Secondary | ICD-10-CM | POA: Diagnosis not present

## 2017-12-11 DIAGNOSIS — Z79899 Other long term (current) drug therapy: Secondary | ICD-10-CM | POA: Diagnosis not present

## 2017-12-11 MED ORDER — OXYCODONE-ACETAMINOPHEN 7.5-325 MG PO TABS
1.0000 | ORAL_TABLET | Freq: Once | ORAL | Status: AC
  Administered 2017-12-11: 1 via ORAL
  Filled 2017-12-11: qty 1

## 2017-12-11 MED ORDER — OXYCODONE-ACETAMINOPHEN 5-325 MG PO TABS: 1.0000 | ORAL_TABLET | Freq: Three times a day (TID) | ORAL | 0 refills | Status: DC | PRN

## 2017-12-11 MED ORDER — ONDANSETRON 4 MG PO TBDP
4.0000 mg | ORAL_TABLET | Freq: Once | ORAL | Status: AC
  Administered 2017-12-11: 4 mg via ORAL
  Filled 2017-12-11: qty 1

## 2017-12-11 NOTE — Telephone Encounter (Signed)
Returned patient's call stating she began having "muscle spasms" with intense pain in her back "near where Dr. Vernard Gambles did the kyphoplasty last month."  I told her I had spoken with Dr. Anselm Pancoast who is here today, and he states she needs to be assessed by her primary care physician or her oncologist.  I explained we work off of referrals and can't initiate care or prescribe pain medications here at the IR clinic; that someone else needs to drive her care.  She stated she was going to the ER to make sure she didn't have another fracture and to get "something to make her feel better."  J. Gretta Cool, RN

## 2017-12-11 NOTE — ED Triage Notes (Signed)
Pt c/o back pain that started yesterday. Reports had back surgery couple weeks ago. Denies any new injuries or falls.

## 2017-12-11 NOTE — ED Provider Notes (Signed)
[facility] DEPT Provider Note   CSN: [id number] Arrival date & time: 12/11/17  1552     History   Chief Complaint Chief Complaint  Patient presents with  . Back Pain    HPI Sherry [name] is a 72 y.o. female.  HPI   Patient is a 72 year old female with a history of COPD, stage III squamous cell carcinoma of the left lung, thoracic compression fracture s/p IR kyphoplasty w/ bone biopsy (Dr. Arne Cleveland, 11/20/17), who presents to the ED today c/o right lower back pain/right posterior rib pain that began yesterday. States pain rated at "20"/10. Feels like a sharp/stabbing pain that "comes every 2 seconds". Feels like a muscle spasm. Pain is worse with movement.  She has been taking tylenol with no relief.   Denies chest pain or sob. No pain with inspiration. Reports nausea that is somewhat chronic and a headache. H/o migraines, states this is more like a tension HA that is not as severe. No abd pain, vomiting, diarrhea. She states she has chronic neuropathy to the BLE since starting chemo that is unchanged today. No loss of control of bowel or bladder function. No urinary retention. Has been ambulatory without difficulty.  Oncologist: Dr. Earlie Server   Past Medical History:  Diagnosis Date  . Allergy   . Anxiety   . Arthritis   . Cavitating mass in left lower lung lobe   . Complication of anesthesia    woke up during procedure  . COPD (chronic obstructive pulmonary disease) ([place])   . Depression   . Family history of adverse reaction to anesthesia    " my mother was always hard to wake up"  . Full dentures   . Headache   . Neuromuscular disorder ([place])   . Pneumonia   . Thoracic spine fracture ([place]) 11/01/2017   T9  . Wears glasses     Patient Active Problem List   Diagnosis Date Noted  . Stage III squamous cell carcinoma of left lung ([place]) 11/27/2017  . Encounter for antineoplastic immunotherapy 11/27/2017  . Thoracic compression  fracture ([place]) 11/02/2017  . Acute right-sided thoracic back pain 10/30/2017  . Angular cheilitis 10/22/2017  . Cellulitis of right knee 10/22/2017  . Encounter for antineoplastic chemotherapy 09/09/2017  . Goals of care, counseling/discussion 09/09/2017  . Malnutrition ([place]) 09/09/2017  . Pneumothorax 08/31/2017  . Primary malignant neoplasm of bronchus of left lower lobe ([place]) 08/26/2017  . Mediastinal mass 08/23/2017  . Chronic airway obstruction ([place]) 03/10/2013  . Osteoporosis 09/20/2012  . Depressive disorder 05/28/2012  . Migraine 05/28/2012    Past Surgical History:  Procedure Laterality Date  . ELBOW SURGERY     for  "Tennis Elbow"  . hand surgery    . IR KYPHO THORACIC WITH BONE BIOPSY  11/27/2017  . IR RADIOLOGIST EVAL & MGMT  11/20/2017  . MULTIPLE TOOTH EXTRACTIONS    . TUBAL LIGATION    . VIDEO BRONCHOSCOPY N/A 08/31/2017   Procedure: VIDEO BRONCHOSCOPY;  Surgeon: Melrose Nakayama, MD;  Location: [facility] OR;  Service: Thoracic;  Laterality: N/A;     OB History   None      Home Medications    Prior to Admission medications   Medication Sig Start Date End Date Taking? Authorizing Provider  acetaminophen (TYLENOL 8 HOUR) 650 MG CR tablet Take 1 tablet (650 mg total) by mouth every 8 (eight) hours as needed for pain or fever. 10/30/17  Yes Varney Biles, MD  cyanocobalamin (,VITAMIN  B-12,) 1000 MCG/ML injection Inject 1,000 mcg into the muscle every 30 (thirty) days. Administered by Dr. Eldridge Abrahams   Yes [provider]  guaifenesin (ROBITUSSIN) 100 MG/5ML syrup Take by mouth daily.   Yes [provider]  Menthol-Ascorbic Acid (LUDENS COUGH DROPS MT) Use as directed 1 lozenge in the mouth or throat 3 (three) times daily.    Yes [provider]  Multiple Vitamin (MULTIVITAMIN WITH MINERALS) TABS tablet Take 1 tablet by mouth daily.   Yes [provider]  oxyCODONE-acetaminophen (PERCOCET/ROXICET) 5-325 MG tablet Take 1 tablet  by mouth every 8 (eight) hours as needed for severe pain. 12/11/17   Muhannad Bignell S, PA-C    Family History Family History  Problem Relation Age of Onset  . Diabetes Mother     Social History Social History   Tobacco Use  . Smoking status: Former Smoker    Packs/day: 1.00    Years: 50.00    Pack years: 50.00    Types: Cigarettes    Last attempt to quit: 08/24/2017    Years since quitting: 0.3  . Smokeless tobacco: Never Used  Substance Use Topics  . Alcohol use: No  . Drug use: No     Allergies   Penicillins   Review of Systems Review of Systems  Constitutional: Negative for fever.  HENT: Negative for ear pain and sore throat.   Eyes: Negative for visual disturbance.  Respiratory: Negative for cough and shortness of breath.   Cardiovascular: Negative for chest pain and leg swelling.  Gastrointestinal: Negative for abdominal pain, constipation, diarrhea, nausea and vomiting.  Genitourinary: Negative for dysuria and hematuria.  Musculoskeletal: Positive for back pain.       Right posterior chest wall pain  Skin: Negative for rash.  Neurological: Negative for headaches.  All other systems reviewed and are negative.  Physical Exam Updated Vital Signs BP 128/70   Pulse 68   Temp 98.6 F (37 C) (Oral)   Resp 19   Ht 5' (1.524 m)   Wt 33.6 kg   SpO2 96%   BMI 14.45 kg/m   Physical Exam  Constitutional:  Thin, chronically ill appearing female  HENT:  Head: Normocephalic and atraumatic.  Eyes: Conjunctivae are normal.  Neck: Neck supple.  Cardiovascular: Normal rate, regular rhythm and normal heart sounds.  No murmur heard. Pulmonary/Chest: Effort normal and breath sounds normal. No stridor. No respiratory distress. She has no wheezes.  Abdominal: Soft. Bowel sounds are normal. There is no tenderness.  Musculoskeletal: She exhibits no edema.  Mild upper thoracic spine TTP (superior to recent kyphoplasty). TTP to the right mid back along the thoracic  paraspinous muscles and just lateral to this along the right posterior ribs. No stepoff, obvious deformity, or overlying skin changes.   Neurological: She is alert.  Skin: Skin is warm and dry.  Psychiatric: She has a normal mood and affect.  Nursing note and vitals reviewed.   ED Treatments / Results  Labs (all labs ordered are listed, but only abnormal results are displayed) Labs Reviewed - No data to display  EKG None  Radiology Dg Ribs Unilateral W/chest Right  Result Date: 12/11/2017 CLINICAL DATA:  Back pain.  No new injuries or falls. EXAM: RIGHT RIBS AND CHEST - 3+ VIEW COMPARISON:  Chest x-rays since September 2019 and chest CT from November 22, 2017. FINDINGS: The known nodule in the right lung is difficult to visualize. The opacity at the site of treated malignancy in the left  lung persists but is subtle. A small left effusion is noted. Opacity underlying the effusion is improved. No pneumothorax. The heart, hila, and mediastinum are unremarkable. No acute rib fracture noted. Vertebroplasty of a mid thoracic vertebral body. IMPRESSION: 1. No rib fracture. 2. The known nodule in the right lung is difficult to visualize on this study. 3. The treated malignancy on the left continues to improved. Small left effusion with atelectasis. Electronically Signed   By: Dorise Bullion III M.D   On: 12/11/2017 18:59   Dg Thoracic Spine 2 View  Result Date: 12/11/2017 CLINICAL DATA:  Back surgery a few weeks ago. Pain without trauma or fall. EXAM: THORACIC SPINE 2 VIEWS COMPARISON:  CT scan November 22, 2017 FINDINGS: Vertebroplasty of a midthoracic vertebral body. No interval fractures noted. No other acute abnormalities. IMPRESSION: Vertebroplasty of a midthoracic vertebral body.  No other change. Electronically Signed   By: Dorise Bullion III M.D   On: 12/11/2017 19:00    Procedures Procedures (including critical care time)  Medications Ordered in ED Medications    oxyCODONE-acetaminophen (PERCOCET) 7.5-325 MG per tablet 1 tablet (1 tablet Oral Given 12/11/17 1820)  ondansetron (ZOFRAN-ODT) disintegrating tablet 4 mg (4 mg Oral Given 12/11/17 1820)     Initial Impression / Assessment and Plan / ED Course  I have reviewed the triage vital signs and the nursing notes.  Pertinent labs & imaging results that were available during my care of the patient were reviewed by me and considered in my medical decision making (see chart for details).     Final Clinical Impressions(s) / ED Diagnoses   Final diagnoses:  Muscle spasm of back   Patient presenting with right mid back pain that began yesterday.  States it feels like a muscle spasm.  She recently had thoracic kyphoplasty several weeks ago.  Denies midline tenderness and has no midline tenderness on exam near her kyphoplasty site.  She does have a tenderness to the right upper thoracic paraspinous muscles and somewhat to the right posterior ribs.  Pain is consistently reproducible in this area.  No overlying skin changes or ecchymosis.  Lungs are clear bilaterally.  Cardiac exam is within normal limits.  She has no chest pain or shortness of breath to suggest cardiac or pulmonary etiology of symptoms and I highly doubt PE.  Chest x-ray the right ribs did not show any evidence of pathologic fracture or abnormality.  X-ray of the thoracic spine just showed evidence of recent kyphoplasty but no other acute changes.  I suspect that her symptoms are musculoskeletal in nature, likely due to muscle spasm.  She was given 1 Percocet in the ED and she states her symptoms felt completely improved.  Will give short course of pain medication for home and have her follow-up with PCP.  Return precautions discussed.  Patient was understanding plan reasons to return the ED via questions answered.  Patient seen in conjunction with Dr. Tyrone Nine who personally evaluated the patient is in agreement with plan.  ED Discharge Orders          Ordered    oxyCODONE-acetaminophen (PERCOCET/ROXICET) 5-325 MG tablet  Every 8 hours PRN     12/11/17 2032           Rodney Booze, PA-C 12/12/17 [place], Dan, DO 12/12/17 1512

## 2017-12-11 NOTE — Telephone Encounter (Signed)
Received voicemail message from patient requesting return call. Phoned patient back. Patient reports sharp shooting pains in her right side below her ribs that radiates to her low back. She reports this pain is severe. She confirms this is close to where Dr. Vernard Gambles did her kyphoplasty. Encouraged patient to present to ED for work up explained if radiation is warranted we will be notified by ED provider. She verbalized understanding and expressed appreciation for return call.

## 2017-12-11 NOTE — Discharge Instructions (Signed)
Prescription given for percocet. Take medication as directed and do not operate machinery, drive a car, or work while taking this medication as it can make you drowsy.   Please follow up with your primary care provider within 5-7 days for re-evaluation of your symptoms. If you do not have a primary care provider, information for a healthcare clinic has been provided for you to make arrangements for follow up care. Please return to the emergency department for any new or worsening symptoms.

## 2017-12-11 NOTE — Telephone Encounter (Signed)
Back Pain -on the way to ED. When is her immunotherapy?

## 2017-12-14 ENCOUNTER — Inpatient Hospital Stay: Payer: PPO | Attending: Nurse Practitioner

## 2017-12-14 ENCOUNTER — Encounter: Payer: Self-pay | Admitting: *Deleted

## 2017-12-14 ENCOUNTER — Inpatient Hospital Stay: Payer: PPO

## 2017-12-14 VITALS — BP 112/66 | HR 97 | Temp 98.2°F | Resp 16

## 2017-12-14 DIAGNOSIS — C3492 Malignant neoplasm of unspecified part of left bronchus or lung: Secondary | ICD-10-CM

## 2017-12-14 DIAGNOSIS — Z79899 Other long term (current) drug therapy: Secondary | ICD-10-CM | POA: Insufficient documentation

## 2017-12-14 DIAGNOSIS — C3432 Malignant neoplasm of lower lobe, left bronchus or lung: Secondary | ICD-10-CM | POA: Diagnosis not present

## 2017-12-14 DIAGNOSIS — Z5112 Encounter for antineoplastic immunotherapy: Secondary | ICD-10-CM | POA: Insufficient documentation

## 2017-12-14 LAB — CBC WITH DIFFERENTIAL (CANCER CENTER ONLY)
ABS IMMATURE GRANULOCYTES: 0.01 10*3/uL (ref 0.00–0.07)
Basophils Absolute: 0 10*3/uL (ref 0.0–0.1)
Basophils Relative: 1 %
Eosinophils Absolute: 0.1 10*3/uL (ref 0.0–0.5)
Eosinophils Relative: 2 %
HCT: 39.6 % (ref 36.0–46.0)
HEMOGLOBIN: 12.4 g/dL (ref 12.0–15.0)
IMMATURE GRANULOCYTES: 0 %
LYMPHS ABS: 1.2 10*3/uL (ref 0.7–4.0)
LYMPHS PCT: 26 %
MCH: 29.3 pg (ref 26.0–34.0)
MCHC: 31.3 g/dL (ref 30.0–36.0)
MCV: 93.6 fL (ref 80.0–100.0)
MONOS PCT: 14 %
Monocytes Absolute: 0.6 10*3/uL (ref 0.1–1.0)
NEUTROS ABS: 2.6 10*3/uL (ref 1.7–7.7)
NEUTROS PCT: 57 %
Platelet Count: 347 10*3/uL (ref 150–400)
RBC: 4.23 MIL/uL (ref 3.87–5.11)
RDW: 19.8 % — ABNORMAL HIGH (ref 11.5–15.5)
WBC Count: 4.5 10*3/uL (ref 4.0–10.5)
nRBC: 0 % (ref 0.0–0.2)

## 2017-12-14 LAB — CMP (CANCER CENTER ONLY)
ALBUMIN: 3.6 g/dL (ref 3.5–5.0)
ALT: 20 U/L (ref 0–44)
AST: 28 U/L (ref 15–41)
Alkaline Phosphatase: 122 U/L (ref 38–126)
Anion gap: 11 (ref 5–15)
BILIRUBIN TOTAL: 0.4 mg/dL (ref 0.3–1.2)
BUN: 22 mg/dL (ref 8–23)
CHLORIDE: 104 mmol/L (ref 98–111)
CO2: 26 mmol/L (ref 22–32)
Calcium: 9.5 mg/dL (ref 8.9–10.3)
Creatinine: 0.81 mg/dL (ref 0.44–1.00)
GFR, Est AFR Am: >60 mL/min (ref >60)
GFR, Estimated: >60 mL/min (ref >60)
GLUCOSE: 118 mg/dL — AB (ref 70–99)
POTASSIUM: 3.3 mmol/L — AB (ref 3.5–5.1)
SODIUM: 141 mmol/L (ref 135–145)
Total Protein: 7.5 g/dL (ref 6.5–8.1)

## 2017-12-14 LAB — TSH: TSH: 2.886 u[IU]/mL (ref 0.308–3.960)

## 2017-12-14 MED ORDER — SODIUM CHLORIDE 0.9% FLUSH
10.0000 mL | INTRAVENOUS | Status: DC | PRN
  Filled 2017-12-14: qty 10

## 2017-12-14 MED ORDER — SODIUM CHLORIDE 0.9 % IV SOLN
Freq: Once | INTRAVENOUS | Status: AC
  Administered 2017-12-14: 09:00:00 via INTRAVENOUS
  Filled 2017-12-14: qty 250

## 2017-12-14 MED ORDER — HEPARIN SOD (PORK) LOCK FLUSH 100 UNIT/ML IV SOLN
500.0000 [IU] | Freq: Once | INTRAVENOUS | Status: DC | PRN
  Filled 2017-12-14: qty 5

## 2017-12-14 MED ORDER — SODIUM CHLORIDE 0.9 % IV SOLN
10.7000 mg/kg | Freq: Once | INTRAVENOUS | Status: AC
  Administered 2017-12-14: 360 mg via INTRAVENOUS
  Filled 2017-12-14: qty 7.2

## 2017-12-14 NOTE — Research (Signed)
[id number]: Procurement of Human Biospecimens for the Discovery and Validation of Biomarkers for the Prediction, Diagnosis and Management of Disease (Path Procurement)  12/14/17 This RN met with patient Sherry [name] regarding participation in the above listed study.  This RN had previously met with patient on 10/29/17 to discuss participation in the study.  This RN also met with the patient on 11/27/17, but she had not had an opportunity to review the consent form in detail on that date.  Today the patient has read the consent form and HIPAA authorization and is eager to participate in the research study.  The patient had a question regarding the costs of the study portion of the consent form, so this was reviewed again with the patient.  There is no anticipated costs, but if the patient experiences a complication from the blood drawn then the research study will not pay for the care of that complication, the patient will responsible for that cost and her insurance company may or may not be willing to pay for that care.  The patient asks what the risks are with a blood draw, and the risks outlined in the consent form were also reviewed with the patient.  The patient's question is answered to her satisfaction, and she agrees to move forward with study participation.  She states that she is not worried about the potential risks of a blood draw.  The patient verbalizes an understanding of the study.  The patient voluntarily signed the informed consent form and HIPAA authorization today.  The patient had a routine blood collection scheduled, and the patient understands that the research blood will drawn at that visit and the gift card would be provided following the completion of blood collection.  The patient was provided with a copy of the informed consent form and HIPAA authorization.  The patient was thanked for her participation in the study.  The patient states, "I hope me participating will help someone  else." Doreatha Martin, RN, BSN, [facility] 12/14/2017 10:35 AM

## 2017-12-14 NOTE — Patient Instructions (Signed)
[facility] Discharge Instructions for Patients Receiving Chemotherapy  Today you received the following chemotherapy agents: Imfinzi  To help prevent nausea and vomiting after your treatment, we encourage you to take your nausea medication as directed. If you develop nausea and vomiting that is not controlled by your nausea medication, call the clinic.   BELOW ARE SYMPTOMS THAT SHOULD BE REPORTED IMMEDIATELY:  *FEVER GREATER THAN 100.5 F  *CHILLS WITH OR WITHOUT FEVER  NAUSEA AND VOMITING THAT IS NOT CONTROLLED WITH YOUR NAUSEA MEDICATION  *UNUSUAL SHORTNESS OF BREATH  *UNUSUAL BRUISING OR BLEEDING  TENDERNESS IN MOUTH AND THROAT WITH OR WITHOUT PRESENCE OF ULCERS  *URINARY PROBLEMS  *BOWEL PROBLEMS  UNUSUAL RASH Items with * indicate a potential emergency and should be followed up as soon as possible.  Feel free to call the clinic should you have any questions or concerns. The clinic phone number is [phone].  Please show the [place] at check-in to the Emergency Department and triage nurse.   Durvalumab injection What is this medicine? DURVALUMAB (dur VAL ue mab) is a monoclonal antibody. It is used to treat urothelial cancer. This medicine may be used for other purposes; ask your health care provider or pharmacist if you have questions. COMMON BRAND NAME(S): IMFINZI What should I tell my health care provider before I take this medicine? They need to know if you have any of these conditions: -diabetes -immune system problems -infection -inflammatory bowel disease -kidney disease -liver disease -lung or breathing disease -lupus -organ transplant -stomach or intestine problems -thyroid disease -an unusual or allergic reaction to durvalumab, other medicines, foods, dyes, or preservatives -pregnant or trying to get pregnant -breast-feeding How should I use this medicine? This medicine is for infusion into a vein. It is given by a  health care professional in a hospital or clinic setting. A special MedGuide will be given to you before each treatment. Be sure to read this information carefully each time. Talk to your pediatrician regarding the use of this medicine in children. Special care may be needed. Overdosage: If you think you have taken too much of this medicine contact a poison control center or emergency room at once. NOTE: This medicine is only for you. Do not share this medicine with others. What if I miss a dose? It is important not to miss your dose. Call your doctor or health care professional if you are unable to keep an appointment. What may interact with this medicine? Interactions have not been studied. This list may not describe all possible interactions. Give your health care provider a list of all the medicines, herbs, non-prescription drugs, or dietary supplements you use. Also tell them if you smoke, drink alcohol, or use illegal drugs. Some items may interact with your medicine. What should I watch for while using this medicine? This drug may make you feel generally unwell. Continue your course of treatment even though you feel ill unless your doctor tells you to stop. You may need blood work done while you are taking this medicine. Do not become pregnant while taking this medicine or for 3 months after stopping it. Women should inform their doctor if they wish to become pregnant or think they might be pregnant. There is a potential for serious side effects to an unborn child. Talk to your health care professional or pharmacist for more information. Do not breast-feed an infant while taking this medicine or for 3 months after stopping it. What side effects may I  notice from receiving this medicine? Side effects that you should report to your doctor or health care professional as soon as possible: -allergic reactions like skin rash, itching or hives, swelling of the face, lips, or tongue -black, tarry  stools -bloody or watery diarrhea -breathing problems -change in emotions or moods -change in sex drive -changes in vision -chest pain or chest tightness -chills -confusion -cough -facial flushing -fever -headache -signs and symptoms of high blood sugar such as dizziness; dry mouth; dry skin; fruity breath; nausea; stomach pain; increased hunger or thirst; increased urination -signs and symptoms of liver injury like dark yellow or brown urine; general ill feeling or flu-like symptoms; light-colored stools; loss of appetite; nausea; right upper belly pain; unusually weak or tired; yellowing of the eyes or skin -stomach pain -trouble passing urine or change in the amount of urine -weight gain or weight loss Side effects that usually do not require medical attention (report these to your doctor or health care professional if they continue or are bothersome): -bone pain -constipation -loss of appetite -muscle pain -nausea -swelling of the ankles, feet, hands -tiredness This list may not describe all possible side effects. Call your doctor for medical advice about side effects. You may report side effects to FDA at 1-800-FDA-1088. Where should I keep my medicine? This drug is given in a hospital or clinic and will not be stored at home. NOTE: This sheet is a summary. It may not cover all possible information. If you have questions about this medicine, talk to your doctor, pharmacist, or health care provider.  2018 Elsevier/Gold Standard (2015-07-30 15:50:36)

## 2017-12-19 ENCOUNTER — Telehealth: Payer: Self-pay | Admitting: Internal Medicine

## 2017-12-19 DIAGNOSIS — H2522 Age-related cataract, morgagnian type, left eye: Secondary | ICD-10-CM | POA: Diagnosis not present

## 2017-12-19 DIAGNOSIS — H25812 Combined forms of age-related cataract, left eye: Secondary | ICD-10-CM | POA: Diagnosis not present

## 2017-12-19 NOTE — Telephone Encounter (Signed)
[facility] out - moved 12/19 f/u to LT and adjusted associated appointments. Left message for patient. Schedule mailed.

## 2017-12-24 DIAGNOSIS — H25041 Posterior subcapsular polar age-related cataract, right eye: Secondary | ICD-10-CM | POA: Diagnosis not present

## 2017-12-24 DIAGNOSIS — H25011 Cortical age-related cataract, right eye: Secondary | ICD-10-CM | POA: Diagnosis not present

## 2017-12-24 DIAGNOSIS — H2511 Age-related nuclear cataract, right eye: Secondary | ICD-10-CM | POA: Diagnosis not present

## 2017-12-24 DIAGNOSIS — H268 Other specified cataract: Secondary | ICD-10-CM | POA: Diagnosis not present

## 2017-12-27 ENCOUNTER — Encounter: Payer: Self-pay | Admitting: *Deleted

## 2017-12-27 ENCOUNTER — Encounter: Payer: PPO | Admitting: Nutrition

## 2017-12-27 ENCOUNTER — Inpatient Hospital Stay: Payer: PPO

## 2017-12-27 ENCOUNTER — Inpatient Hospital Stay (HOSPITAL_BASED_OUTPATIENT_CLINIC_OR_DEPARTMENT_OTHER): Payer: PPO | Admitting: Nurse Practitioner

## 2017-12-27 ENCOUNTER — Encounter: Payer: Self-pay | Admitting: Nurse Practitioner

## 2017-12-27 VITALS — BP 130/64 | HR 92 | Temp 99.1°F | Resp 18 | Ht 60.0 in | Wt 75.5 lb

## 2017-12-27 DIAGNOSIS — C3492 Malignant neoplasm of unspecified part of left bronchus or lung: Secondary | ICD-10-CM

## 2017-12-27 DIAGNOSIS — C3432 Malignant neoplasm of lower lobe, left bronchus or lung: Secondary | ICD-10-CM

## 2017-12-27 DIAGNOSIS — Z5112 Encounter for antineoplastic immunotherapy: Secondary | ICD-10-CM | POA: Diagnosis not present

## 2017-12-27 DIAGNOSIS — Z79899 Other long term (current) drug therapy: Secondary | ICD-10-CM | POA: Diagnosis not present

## 2017-12-27 LAB — CMP (CANCER CENTER ONLY)
ALT: 12 U/L (ref 0–44)
AST: 20 U/L (ref 15–41)
Albumin: 3.5 g/dL (ref 3.5–5.0)
Alkaline Phosphatase: 130 U/L — ABNORMAL HIGH (ref 38–126)
Anion gap: 11 (ref 5–15)
BUN: 9 mg/dL (ref 8–23)
CO2: 28 mmol/L (ref 22–32)
Calcium: 9.5 mg/dL (ref 8.9–10.3)
Chloride: 104 mmol/L (ref 98–111)
Creatinine: 0.7 mg/dL (ref 0.44–1.00)
GFR, Est AFR Am: >60 mL/min (ref >60)
GFR, Estimated: >60 mL/min (ref >60)
GLUCOSE: 89 mg/dL (ref 70–99)
Potassium: 3.8 mmol/L (ref 3.5–5.1)
Sodium: 143 mmol/L (ref 135–145)
Total Bilirubin: 0.4 mg/dL (ref 0.3–1.2)
Total Protein: 7.7 g/dL (ref 6.5–8.1)

## 2017-12-27 LAB — CBC WITH DIFFERENTIAL (CANCER CENTER ONLY)
Abs Immature Granulocytes: 0.01 10*3/uL (ref 0.00–0.07)
Basophils Absolute: 0 10*3/uL (ref 0.0–0.1)
Basophils Relative: 1 %
EOS PCT: 1 %
Eosinophils Absolute: 0.1 10*3/uL (ref 0.0–0.5)
HCT: 39.5 % (ref 36.0–46.0)
Hemoglobin: 12.1 g/dL (ref 12.0–15.0)
Immature Granulocytes: 0 %
Lymphocytes Relative: 15 %
Lymphs Abs: 0.9 10*3/uL (ref 0.7–4.0)
MCH: 29.4 pg (ref 26.0–34.0)
MCHC: 30.6 g/dL (ref 30.0–36.0)
MCV: 96.1 fL (ref 80.0–100.0)
MONO ABS: 0.7 10*3/uL (ref 0.1–1.0)
Monocytes Relative: 11 %
Neutro Abs: 4.7 10*3/uL (ref 1.7–7.7)
Neutrophils Relative %: 72 %
Platelet Count: 346 10*3/uL (ref 150–400)
RBC: 4.11 MIL/uL (ref 3.87–5.11)
RDW: 17.3 % — ABNORMAL HIGH (ref 11.5–15.5)
WBC Count: 6.5 10*3/uL (ref 4.0–10.5)
nRBC: 0 % (ref 0.0–0.2)

## 2017-12-27 LAB — RESEARCH LABS

## 2017-12-27 MED ORDER — SODIUM CHLORIDE 0.9 % IV SOLN
10.7000 mg/kg | Freq: Once | INTRAVENOUS | Status: AC
  Administered 2017-12-27: 360 mg via INTRAVENOUS
  Filled 2017-12-27: qty 7.2

## 2017-12-27 MED ORDER — SODIUM CHLORIDE 0.9 % IV SOLN
Freq: Once | INTRAVENOUS | Status: AC
  Administered 2017-12-27: 16:00:00 via INTRAVENOUS
  Filled 2017-12-27: qty 250

## 2017-12-27 NOTE — Patient Instructions (Signed)
[facility] Discharge Instructions for Patients Receiving Chemotherapy  Today you received the following chemotherapy agents Durvalumab (IMFINZI).  To help prevent nausea and vomiting after your treatment, we encourage you to take your nausea medication as prescribed.   If you develop nausea and vomiting that is not controlled by your nausea medication, call the clinic.   BELOW ARE SYMPTOMS THAT SHOULD BE REPORTED IMMEDIATELY:  *FEVER GREATER THAN 100.5 F  *CHILLS WITH OR WITHOUT FEVER  NAUSEA AND VOMITING THAT IS NOT CONTROLLED WITH YOUR NAUSEA MEDICATION  *UNUSUAL SHORTNESS OF BREATH  *UNUSUAL BRUISING OR BLEEDING  TENDERNESS IN MOUTH AND THROAT WITH OR WITHOUT PRESENCE OF ULCERS  *URINARY PROBLEMS  *BOWEL PROBLEMS  UNUSUAL RASH Items with * indicate a potential emergency and should be followed up as soon as possible.  Feel free to call the clinic should you have any questions or concerns. The clinic phone number is [phone].  Please show the [place] at check-in to the Emergency Department and triage nurse.

## 2017-12-27 NOTE — Progress Notes (Signed)
  [facility] OFFICE PROGRESS NOTE   DIAGNOSIS: Stage IIb/IIIa (T3, N0/N1, M0)non-small cell lung cancer poorly differentiated squamous cell carcinoma diagnosed in August 2019. The patient presented with large superior segment leftlower lobe mass with questionable left hilar adenopathy.  PRIOR THERAPY: Concurrent chemoradiation with weekly carboplatin for AUC of 2 and paclitaxel 45 mg/M2.  Status post 7 cycles.  Last dose was given October 29, 2017 with partial response.  CURRENT THERAPY: Consolidation treatment with immunotherapy with Imfinzi 10 mg/KG every 2 weeks to start December 14, 2017.    INTERVAL HISTORY:   Ms. Sauerwein returns as scheduled.  She completed cycle 1 immunotherapy with Imfinzi 12/14/2017.  She denies nausea/vomiting.  No mouth sores.  No diarrhea.  No rash.  She denies shortness of breath.  She has an occasional cough.  No fever.  Objective:  Vital signs in last 24 hours:  Blood pressure 130/64, pulse 92, temperature 99.1 F (37.3 C), temperature source Oral, resp. rate 18, height 5' (1.524 m), weight 75 lb 8 oz (34.2 kg), SpO2 93 %.    HEENT: No thrush or ulcers. Resp: Distant breath sounds.  Scattered wheezes.  No respiratory distress. Cardio: Regular rate and rhythm. GI: Abdomen soft and nontender.  No hepatomegaly. Vascular: No leg edema.   Lab Results:  Lab Results  Component Value Date   WBC 6.5 12/27/2017   HGB 12.1 12/27/2017   HCT 39.5 12/27/2017   MCV 96.1 12/27/2017   PLT 346 12/27/2017   NEUTROABS 4.7 12/27/2017    Imaging:  No results found.  Medications: I have reviewed the patient's current medications.  Assessment/Plan: 1. Stage IIIa non-small cell lung cancer, poorly differentiated squamous cell carcinoma status post concurrent chemoradiation with weekly carboplatin and Taxol status post 7 cycles with partial response.  She began consolidation immunotherapy with Imfinzi 12/14/2017.  Disposition: Ms. Picazo appears  stable.  She has completed 1 cycle of consolidation immunotherapy with Imfinzi.  Plan to proceed with cycle 2 today as scheduled.  She will return for a follow-up visit and cycle 3 in 2 weeks.  She will contact the office in the interim with any problems.  Plan reviewed with Dr. Julien Nordmann.    Ned Card ANP/GNP-BC   12/27/2017  3:13 PM

## 2017-12-27 NOTE — Progress Notes (Signed)
Patient in clinic today,labs drawn per [id number]. Patient was given a $50 debit card for participating in the research study. Patient was thanked for her time and contribution the study. Farris Has, Research Assistant

## 2018-01-04 DIAGNOSIS — H938X3 Other specified disorders of ear, bilateral: Secondary | ICD-10-CM | POA: Diagnosis not present

## 2018-01-04 DIAGNOSIS — R0989 Other specified symptoms and signs involving the circulatory and respiratory systems: Secondary | ICD-10-CM | POA: Diagnosis not present

## 2018-01-10 ENCOUNTER — Ambulatory Visit: Payer: PPO

## 2018-01-10 ENCOUNTER — Inpatient Hospital Stay: Payer: Medicare Other

## 2018-01-10 ENCOUNTER — Telehealth: Payer: Self-pay

## 2018-01-10 ENCOUNTER — Inpatient Hospital Stay: Payer: Medicare Other | Attending: Nurse Practitioner | Admitting: Internal Medicine

## 2018-01-10 VITALS — BP 127/65 | HR 93 | Temp 98.6°F | Resp 20 | Ht 60.0 in | Wt 75.6 lb

## 2018-01-10 DIAGNOSIS — Z9221 Personal history of antineoplastic chemotherapy: Secondary | ICD-10-CM | POA: Insufficient documentation

## 2018-01-10 DIAGNOSIS — F329 Major depressive disorder, single episode, unspecified: Secondary | ICD-10-CM | POA: Diagnosis not present

## 2018-01-10 DIAGNOSIS — Z5112 Encounter for antineoplastic immunotherapy: Secondary | ICD-10-CM | POA: Diagnosis present

## 2018-01-10 DIAGNOSIS — Z87891 Personal history of nicotine dependence: Secondary | ICD-10-CM | POA: Insufficient documentation

## 2018-01-10 DIAGNOSIS — M545 Low back pain: Secondary | ICD-10-CM | POA: Diagnosis not present

## 2018-01-10 DIAGNOSIS — J449 Chronic obstructive pulmonary disease, unspecified: Secondary | ICD-10-CM | POA: Diagnosis not present

## 2018-01-10 DIAGNOSIS — Z79899 Other long term (current) drug therapy: Secondary | ICD-10-CM | POA: Diagnosis not present

## 2018-01-10 DIAGNOSIS — Z923 Personal history of irradiation: Secondary | ICD-10-CM | POA: Insufficient documentation

## 2018-01-10 DIAGNOSIS — C3492 Malignant neoplasm of unspecified part of left bronchus or lung: Secondary | ICD-10-CM

## 2018-01-10 DIAGNOSIS — C3432 Malignant neoplasm of lower lobe, left bronchus or lung: Secondary | ICD-10-CM | POA: Diagnosis not present

## 2018-01-10 LAB — CMP (CANCER CENTER ONLY)
ALT: 9 U/L (ref 0–44)
AST: 15 U/L (ref 15–41)
Albumin: 3 g/dL — ABNORMAL LOW (ref 3.5–5.0)
Alkaline Phosphatase: 101 U/L (ref 38–126)
Anion gap: 8 (ref 5–15)
BUN: 10 mg/dL (ref 8–23)
CALCIUM: 8.8 mg/dL — AB (ref 8.9–10.3)
CO2: 29 mmol/L (ref 22–32)
Chloride: 105 mmol/L (ref 98–111)
Creatinine: 0.7 mg/dL (ref 0.44–1.00)
GFR, Est AFR Am: >60 mL/min (ref >60)
Glucose, Bld: 95 mg/dL (ref 70–99)
Potassium: 3.6 mmol/L (ref 3.5–5.1)
Sodium: 142 mmol/L (ref 135–145)
Total Bilirubin: 0.4 mg/dL (ref 0.3–1.2)
Total Protein: 6.8 g/dL (ref 6.5–8.1)

## 2018-01-10 LAB — CBC WITH DIFFERENTIAL (CANCER CENTER ONLY)
Abs Immature Granulocytes: 0.03 10*3/uL (ref 0.00–0.07)
Basophils Absolute: 0 10*3/uL (ref 0.0–0.1)
Basophils Relative: 1 %
EOS PCT: 2 %
Eosinophils Absolute: 0.2 10*3/uL (ref 0.0–0.5)
HCT: 35.4 % — ABNORMAL LOW (ref 36.0–46.0)
Hemoglobin: 10.8 g/dL — ABNORMAL LOW (ref 12.0–15.0)
Immature Granulocytes: 1 %
Lymphocytes Relative: 15 %
Lymphs Abs: 1 10*3/uL (ref 0.7–4.0)
MCH: 29 pg (ref 26.0–34.0)
MCHC: 30.5 g/dL (ref 30.0–36.0)
MCV: 95.2 fL (ref 80.0–100.0)
MONOS PCT: 12 %
Monocytes Absolute: 0.8 10*3/uL (ref 0.1–1.0)
Neutro Abs: 4.5 10*3/uL (ref 1.7–7.7)
Neutrophils Relative %: 69 %
Platelet Count: 380 10*3/uL (ref 150–400)
RBC: 3.72 MIL/uL — ABNORMAL LOW (ref 3.87–5.11)
RDW: 15.2 % (ref 11.5–15.5)
WBC Count: 6.6 10*3/uL (ref 4.0–10.5)
nRBC: 0 % (ref 0.0–0.2)

## 2018-01-10 LAB — TSH: TSH: 1.725 u[IU]/mL (ref 0.308–3.960)

## 2018-01-10 NOTE — Progress Notes (Signed)
[facility] [phone]   [phone]  OFFICE PROGRESS NOTE  [name], NP [place] [address]  DIAGNOSIS: Stage IIb/IIIa (T3, N0/N1, M0) non-small cell lung cancer poorly differentiated squamous cell carcinoma diagnosed in August 2019.  The patient presented with large superior segment left lower lobe mass with questionable left hilar adenopathy.  PRIOR THERAPY: Concurrent chemoradiation with weekly carboplatin for AUC of 2 and paclitaxel 45 mg/M2.  Status post 7 cycles.  Last dose was given October 29, 2017 with partial response.  CURRENT THERAPY: Consolidation treatment with immunotherapy with Imfinzi 10 mg/KG every 2 weeks to start December 13, 2017.  Status post 2 cycles.  INTERVAL HISTORY: Sherry [name] 73 y.o. female returns to the clinic today for follow-up visit accompanied by her son.  The patient is feeling fine today with no concerning complaints.  She continues to tolerate her treatment with consolidation Imfinzi fairly well.  She denied having any chest pain, shortness breath, cough or hemoptysis.  She denied having any fever or chills.  She has no nausea, vomiting, diarrhea or constipation.  She has no recent weight loss or night sweats.  The patient is here today for evaluation before starting cycle #3.  MEDICAL HISTORY: Past Medical History:  Diagnosis Date  . Allergy   . Anxiety   . Arthritis   . Cavitating mass in left lower lung lobe   . Complication of anesthesia    woke up during procedure  . COPD (chronic obstructive pulmonary disease) ([place] [place])   . Depression   . Family history of adverse reaction to anesthesia    " my mother was always hard to wake up"  . Full dentures   . Headache   . Neuromuscular disorder ([place])   . Pneumonia   . Thoracic spine fracture ([place]) 11/01/2017   T9  . Wears glasses     ALLERGIES:  is allergic to penicillins.  MEDICATIONS:  Current Outpatient Medications   Medication Sig Dispense Refill  . acetaminophen (TYLENOL 8 HOUR) 650 MG CR tablet Take 1 tablet (650 mg total) by mouth every 8 (eight) hours as needed for pain or fever. 30 tablet 0  . cyanocobalamin (,VITAMIN B-12,) 1000 MCG/ML injection Inject 1,000 mcg into the muscle every 30 (thirty) days. Administered by Dr. Eldridge Abrahams    . guaifenesin (ROBITUSSIN) 100 MG/5ML syrup Take by mouth daily.    . Menthol-Ascorbic Acid (LUDENS COUGH DROPS MT) Use as directed 1 lozenge in the mouth or throat 3 (three) times daily.     . Multiple Vitamin (MULTIVITAMIN WITH MINERALS) TABS tablet Take 1 tablet by mouth daily.    Marland Kitchen oxyCODONE-acetaminophen (PERCOCET/ROXICET) 5-325 MG tablet Take 1 tablet by mouth every 8 (eight) hours as needed for severe pain. 3 tablet 0   No current facility-administered medications for this visit.     SURGICAL HISTORY:  Past Surgical History:  Procedure Laterality Date  . ELBOW SURGERY     for  "Tennis Elbow"  . hand surgery    . IR KYPHO THORACIC WITH BONE BIOPSY  11/27/2017  . IR RADIOLOGIST EVAL & MGMT  11/20/2017  . MULTIPLE TOOTH EXTRACTIONS    . TUBAL LIGATION    . VIDEO BRONCHOSCOPY N/A 08/31/2017   Procedure: VIDEO BRONCHOSCOPY;  Surgeon: Melrose Nakayama, MD;  Location: [facility] OR;  Service: Thoracic;  Laterality: N/A;    REVIEW OF SYSTEMS:  A comprehensive review of systems  was negative.   PHYSICAL EXAMINATION: General appearance: alert, cooperative and no distress Head: Normocephalic, without obvious abnormality, atraumatic Neck: no adenopathy, no JVD, supple, symmetrical, trachea midline and thyroid not enlarged, symmetric, no tenderness/mass/nodules Lymph nodes: Cervical, supraclavicular, and axillary nodes normal. Resp: clear to auscultation bilaterally Back: symmetric, no curvature. ROM normal. No CVA tenderness. Cardio: regular rate and rhythm, S1, S2 normal, no murmur, click, rub or gallop GI: soft, non-tender; bowel sounds normal; no masses,  no  organomegaly Extremities: extremities normal, atraumatic, no cyanosis or edema  ECOG PERFORMANCE STATUS: 1 - Symptomatic but completely ambulatory  Blood pressure 127/65, pulse 93, temperature 98.6 F (37 C), temperature source Oral, resp. rate 20, height 5' (1.524 m), weight 75 lb 9.6 oz (34.3 kg), SpO2 90 %.  LABORATORY DATA: Lab Results  Component Value Date   WBC 6.6 01/10/2018   HGB 10.8 (L) 01/10/2018   HCT 35.4 (L) 01/10/2018   MCV 95.2 01/10/2018   PLT 380 01/10/2018      Chemistry      Component Value Date/Time   NA 142 01/10/2018 1115   K 3.6 01/10/2018 1115   CL 105 01/10/2018 1115   CO2 29 01/10/2018 1115   BUN 10 01/10/2018 1115   CREATININE 0.70 01/10/2018 1115      Component Value Date/Time   CALCIUM 8.8 (L) 01/10/2018 1115   ALKPHOS 101 01/10/2018 1115   AST 15 01/10/2018 1115   ALT 9 01/10/2018 1115   BILITOT 0.4 01/10/2018 1115       RADIOGRAPHIC STUDIES: Dg Ribs Unilateral W/chest Right  Result Date: 12/11/2017 CLINICAL DATA:  Back pain.  No new injuries or falls. EXAM: RIGHT RIBS AND CHEST - 3+ VIEW COMPARISON:  Chest x-rays since September 2019 and chest CT from November 22, 2017. FINDINGS: The known nodule in the right lung is difficult to visualize. The opacity at the site of treated malignancy in the left lung persists but is subtle. A small left effusion is noted. Opacity underlying the effusion is improved. No pneumothorax. The heart, hila, and mediastinum are unremarkable. No acute rib fracture noted. Vertebroplasty of a mid thoracic vertebral body. IMPRESSION: 1. No rib fracture. 2. The known nodule in the right lung is difficult to visualize on this study. 3. The treated malignancy on the left continues to improved. Small left effusion with atelectasis. Electronically Signed   By: Dorise Bullion III M.D   On: 12/11/2017 18:59   Dg Thoracic Spine 2 View  Result Date: 12/11/2017 CLINICAL DATA:  Back surgery a few weeks ago. Pain without trauma  or fall. EXAM: THORACIC SPINE 2 VIEWS COMPARISON:  CT scan November 22, 2017 FINDINGS: Vertebroplasty of a midthoracic vertebral body. No interval fractures noted. No other acute abnormalities. IMPRESSION: Vertebroplasty of a midthoracic vertebral body.  No other change. Electronically Signed   By: Dorise Bullion III M.D   On: 12/11/2017 19:00    ASSESSMENT AND PLAN: This is a very pleasant 73 years old white female with a stage IIIa non-small cell lung cancer, poorly differentiated squamous cell carcinoma.   The patient underwent a course of concurrent chemoradiation with weekly carboplatin and paclitaxel status post 7 cycles with partial response. The patient is currently undergoing consolidation treatment with immunotherapy with Imfinzi status post 2 cycles.  She has been tolerating this treatment well with no concerning complaints. I recommended for the patient to proceed with cycle #3 today as scheduled. I will see the patient back for follow-up visit in 2 weeks  for evaluation before the next cycle of her treatment. She was advised to call immediately if she has any concerning symptoms in the interval. The patient voices understanding of current disease status and treatment options and is in agreement with the current care plan.  All questions were answered. The patient knows to call the clinic with any problems, questions or concerns. We can certainly see the patient much sooner if necessary.  I spent 10 minutes counseling the patient face to face. The total time spent in the appointment was 15 minutes.  Disclaimer: This note was dictated with voice recognition software. Similar sounding words can inadvertently be transcribed and may not be corrected upon review.

## 2018-01-10 NOTE — Telephone Encounter (Signed)
Printed avs and calender of upcoming appointment. Per 1/2 los 

## 2018-01-11 ENCOUNTER — Inpatient Hospital Stay: Payer: Medicare Other | Admitting: Nutrition

## 2018-01-11 ENCOUNTER — Telehealth: Payer: Self-pay | Admitting: Internal Medicine

## 2018-01-11 ENCOUNTER — Inpatient Hospital Stay: Payer: Medicare Other

## 2018-01-11 VITALS — BP 129/58 | HR 87 | Temp 98.6°F | Resp 16

## 2018-01-11 DIAGNOSIS — C3492 Malignant neoplasm of unspecified part of left bronchus or lung: Secondary | ICD-10-CM

## 2018-01-11 DIAGNOSIS — Z5112 Encounter for antineoplastic immunotherapy: Secondary | ICD-10-CM | POA: Diagnosis not present

## 2018-01-11 MED ORDER — SODIUM CHLORIDE 0.9 % IV SOLN
10.5000 mg/kg | Freq: Once | INTRAVENOUS | Status: AC
  Administered 2018-01-11: 360 mg via INTRAVENOUS
  Filled 2018-01-11: qty 7.2

## 2018-01-11 MED ORDER — SODIUM CHLORIDE 0.9 % IV SOLN
Freq: Once | INTRAVENOUS | Status: DC
  Filled 2018-01-11: qty 250

## 2018-01-11 NOTE — Patient Instructions (Signed)
[facility] Discharge Instructions for Patients Receiving Chemotherapy  Today you received the following chemotherapy agents Durvalumab (IMFINZI).  To help prevent nausea and vomiting after your treatment, we encourage you to take your nausea medication as prescribed.   If you develop nausea and vomiting that is not controlled by your nausea medication, call the clinic.   BELOW ARE SYMPTOMS THAT SHOULD BE REPORTED IMMEDIATELY:  *FEVER GREATER THAN 100.5 F  *CHILLS WITH OR WITHOUT FEVER  NAUSEA AND VOMITING THAT IS NOT CONTROLLED WITH YOUR NAUSEA MEDICATION  *UNUSUAL SHORTNESS OF BREATH  *UNUSUAL BRUISING OR BLEEDING  TENDERNESS IN MOUTH AND THROAT WITH OR WITHOUT PRESENCE OF ULCERS  *URINARY PROBLEMS  *BOWEL PROBLEMS  UNUSUAL RASH Items with * indicate a potential emergency and should be followed up as soon as possible.  Feel free to call the clinic should you have any questions or concerns. The clinic phone number is [phone].  Please show the [place] at check-in to the Emergency Department and triage nurse.

## 2018-01-11 NOTE — Telephone Encounter (Signed)
Patient came in to change appointment times.

## 2018-01-11 NOTE — Telephone Encounter (Signed)
Printed calendar and avs. °

## 2018-01-12 ENCOUNTER — Encounter: Payer: Self-pay | Admitting: Internal Medicine

## 2018-01-24 ENCOUNTER — Ambulatory Visit: Payer: Medicare Other

## 2018-01-24 ENCOUNTER — Other Ambulatory Visit: Payer: Medicare Other

## 2018-01-24 ENCOUNTER — Ambulatory Visit: Payer: Medicare Other | Admitting: Internal Medicine

## 2018-01-25 ENCOUNTER — Telehealth: Payer: Self-pay | Admitting: Adult Health

## 2018-01-25 ENCOUNTER — Ambulatory Visit (HOSPITAL_COMMUNITY)
Admission: RE | Admit: 2018-01-25 | Discharge: 2018-01-25 | Disposition: A | Discharge status: Home / Self Care | Payer: Medicare Other | Source: Ambulatory Visit | Attending: Adult Health | Admitting: Adult Health

## 2018-01-25 ENCOUNTER — Encounter: Payer: Self-pay | Admitting: Adult Health

## 2018-01-25 ENCOUNTER — Inpatient Hospital Stay: Payer: Medicare Other

## 2018-01-25 ENCOUNTER — Inpatient Hospital Stay (HOSPITAL_BASED_OUTPATIENT_CLINIC_OR_DEPARTMENT_OTHER): Payer: Medicare Other | Admitting: Adult Health

## 2018-01-25 VITALS — BP 119/65 | HR 83 | Temp 98.5°F | Resp 17 | Ht 60.0 in | Wt 76.1 lb

## 2018-01-25 DIAGNOSIS — Z87891 Personal history of nicotine dependence: Secondary | ICD-10-CM | POA: Diagnosis not present

## 2018-01-25 DIAGNOSIS — Z5112 Encounter for antineoplastic immunotherapy: Secondary | ICD-10-CM | POA: Diagnosis not present

## 2018-01-25 DIAGNOSIS — M818 Other osteoporosis without current pathological fracture: Secondary | ICD-10-CM | POA: Diagnosis not present

## 2018-01-25 DIAGNOSIS — C3492 Malignant neoplasm of unspecified part of left bronchus or lung: Secondary | ICD-10-CM | POA: Diagnosis present

## 2018-01-25 DIAGNOSIS — C3432 Malignant neoplasm of lower lobe, left bronchus or lung: Secondary | ICD-10-CM | POA: Diagnosis not present

## 2018-01-25 DIAGNOSIS — Z9221 Personal history of antineoplastic chemotherapy: Secondary | ICD-10-CM

## 2018-01-25 DIAGNOSIS — M549 Dorsalgia, unspecified: Secondary | ICD-10-CM | POA: Diagnosis not present

## 2018-01-25 LAB — CBC WITH DIFFERENTIAL (CANCER CENTER ONLY)
Abs Immature Granulocytes: 0.02 10*3/uL (ref 0.00–0.07)
Basophils Absolute: 0 10*3/uL (ref 0.0–0.1)
Basophils Relative: 0 %
Eosinophils Absolute: 0.2 10*3/uL (ref 0.0–0.5)
Eosinophils Relative: 2 %
HCT: 38 % (ref 36.0–46.0)
Hemoglobin: 11.7 g/dL — ABNORMAL LOW (ref 12.0–15.0)
Immature Granulocytes: 0 %
Lymphocytes Relative: 12 %
Lymphs Abs: 0.8 10*3/uL (ref 0.7–4.0)
MCH: 28.8 pg (ref 26.0–34.0)
MCHC: 30.8 g/dL (ref 30.0–36.0)
MCV: 93.6 fL (ref 80.0–100.0)
Monocytes Absolute: 0.7 10*3/uL (ref 0.1–1.0)
Monocytes Relative: 10 %
Neutro Abs: 5.3 10*3/uL (ref 1.7–7.7)
Neutrophils Relative %: 76 %
Platelet Count: 377 10*3/uL (ref 150–400)
RBC: 4.06 MIL/uL (ref 3.87–5.11)
RDW: 14.4 % (ref 11.5–15.5)
WBC Count: 7 10*3/uL (ref 4.0–10.5)
nRBC: 0 % (ref 0.0–0.2)

## 2018-01-25 LAB — CMP (CANCER CENTER ONLY)
ALT: 7 U/L (ref 0–44)
AST: 13 U/L — AB (ref 15–41)
Albumin: 3.3 g/dL — ABNORMAL LOW (ref 3.5–5.0)
Alkaline Phosphatase: 109 U/L (ref 38–126)
Anion gap: 9 (ref 5–15)
BUN: 10 mg/dL (ref 8–23)
CO2: 27 mmol/L (ref 22–32)
Calcium: 9 mg/dL (ref 8.9–10.3)
Chloride: 105 mmol/L (ref 98–111)
Creatinine: 0.68 mg/dL (ref 0.44–1.00)
GFR, Est AFR Am: >60 mL/min (ref >60)
GFR, Estimated: >60 mL/min (ref >60)
Glucose, Bld: 95 mg/dL (ref 70–99)
Potassium: 3.5 mmol/L (ref 3.5–5.1)
Sodium: 141 mmol/L (ref 135–145)
Total Bilirubin: 0.3 mg/dL (ref 0.3–1.2)
Total Protein: 7.1 g/dL (ref 6.5–8.1)

## 2018-01-25 MED ORDER — SODIUM CHLORIDE 0.9 % IV SOLN
Freq: Once | INTRAVENOUS | Status: AC
  Administered 2018-01-25: 14:00:00 via INTRAVENOUS
  Filled 2018-01-25: qty 250

## 2018-01-25 MED ORDER — SODIUM CHLORIDE 0.9 % IV SOLN
10.5000 mg/kg | Freq: Once | INTRAVENOUS | Status: AC
  Administered 2018-01-25: 360 mg via INTRAVENOUS
  Filled 2018-01-25: qty 7.2

## 2018-01-25 NOTE — Assessment & Plan Note (Addendum)
This is a pleasant 72 years old white female with a stage IIIa non-small cell lung cancer, poorly differentiated squamous cell carcinoma.  She has received a course of concurrent chemoradiation with weekly carboplatin and paclitaxel status post 7 cycles with partial response and is now currently undergoing consolidation treatment with immunotherapy with Imfinzi every two weeks.    1. Lung cancer: continue treatment with Imfinzi every two weeks.  Tolerating treatments well.  Labs are stable.  2.  Back pain: unclear of etiology.  Sent for xrays today.  Will continue either tylenol or percocet.  Reviewed to be cautious not to take more than 2500mg  of tylenol in 24 hours and that there is tylenol in the percocet as well in OTC tylenol.  She understands.  We talked about the role of heat and ice in helping her pain.    Patient to return in 2 weeks for labs, f/u with Cyril Mourning, and her next treatment.

## 2018-01-25 NOTE — Progress Notes (Signed)
Sherry [name], Sherry [name] mass abutting the pleural surface is consistent with bronchogenic carcinoma.    08/23/2017 Initial Diagnosis    Mediastinal mass    08/31/2017 Surgery    Bronchoscopy    09/05/2017 Pathology Results    Lung, biopsy, Left Lower [name] - SQUAMOUS CELL CARCINOMA, POORLY DIFFERENTIATED - SEE COMMENT Microscopic Comment The neoplastic cells are positive for cytokeratin 5/6 and p63 but negative for TTF-1, synaptophysin and CD56. The overall features are consistent with a poorly differentiated squamous cell carcinoma.    09/11/2017 Imaging    MRI Brain No metastatic disease or acute intracranial abnormality. Normal for age MRI appearance of the brain.    09/11/2017 -  Radiation Therapy    SIM     Primary malignant neoplasm of bronchus of left lower [name] ([place])   08/26/2017 Initial Diagnosis    Primary malignant neoplasm of bronchus of left lower [name] ([place])    09/17/2017 - 11/04/2017 Chemotherapy    The patient had palonosetron (ALOXI) injection 0.25 mg, 0.25 mg, Intravenous,  Once, 7 of 7 cycles Administration: 0.25 mg (09/17/2017), 0.25 mg (10/15/2017), 0.25 mg (10/22/2017), 0.25 mg (09/24/2017), 0.25 mg (10/29/2017), 0.25 mg (10/01/2017), 0.25 mg (10/08/2017) CARBOplatin (PARAPLATIN) 100 mg in sodium chloride 0.9 % 100 mL chemo infusion, 100 mg (100 % of original dose 102.2 mg), Intravenous,  Once, 7 of 7 cycles Dose modification: 102.2 mg (original dose 102.2 mg, Cycle 1) Administration: 100 mg (09/17/2017), 100 mg (10/15/2017), 100 mg (10/22/2017), 100 mg  (09/24/2017), 100 mg (10/29/2017), 100 mg (10/01/2017), 100 mg (10/08/2017) PACLitaxel (TAXOL) 54 mg in sodium chloride 0.9 % 150 mL chemo infusion (</= 80mg /m2), 45 mg/m2 = 54 mg, Intravenous,  Once, 7 of 7 cycles Administration: 54 mg (09/17/2017), 54 mg (10/15/2017), 54 mg (10/22/2017), 54 mg (09/24/2017), 54 mg (10/29/2017), 54 mg (10/01/2017), 54 mg (10/08/2017)  for chemotherapy treatment.      Stage III squamous cell carcinoma of left lung ([place])   11/27/2017 Initial Diagnosis    Stage III squamous cell carcinoma of left lung ([place])    12/14/2017 -  Chemotherapy    The patient had durvalumab (IMFINZI) 360 mg in sodium chloride 0.9 % 100 mL chemo infusion, 10.7 mg/kg = 340 mg, Intravenous,  Once, 3 of 10 cycles Administration: 360 mg (12/14/2017), 360 mg (12/27/2017), 360 mg (01/11/2018)  for chemotherapy treatment.      CURRENT THERAPY: Imfinzi every two weeks  INTERVAL HISTORY: Sherry [name] 73 y.o. female returns for evaluation prior to receiving Imfinzi.  She is doing well today.  She notes that she was at the grocery store last Tuesday getting her groceries, and after putting them up she noted constant grabbing pain in her right lower back.  She notes that she has had a compression fracture before and this feels like this.  She says it is a constant 10/10 pain.  She says heat has been helping. She denies any focal weakness, difficulty walking, numbness or tingling, bowel or bladder changes or incontinence, saddle anesthesia.  She is taking tylenol for pain, and sometimes percocet, but the percocet makes her sleepy. She says she is feeling well otherwise and a detailed ROS Was otherwise non contributory.     Patient Active Problem List   Diagnosis Date Noted  . Stage III squamous cell carcinoma of left lung ([name]) 11/27/2017  . Encounter for antineoplastic immunotherapy 11/27/2017  . Thoracic compression fracture ([place]) 11/02/2017  . Acute right-sided thoracic back pain 10/30/2017  . Angular  cheilitis 10/22/2017  . Cellulitis of right knee 10/22/2017  . Encounter for antineoplastic chemotherapy 09/09/2017  . Goals of care, counseling/discussion 09/09/2017  . Malnutrition ([name]) 09/09/2017  . Pneumothorax 08/31/2017  . Primary malignant neoplasm of bronchus of left lower [name] ([name]) 08/26/2017  . Mediastinal mass 08/23/2017  . Chronic airway obstruction ([name]) 03/10/2013  . Osteoporosis 09/20/2012  . Depressive disorder 05/28/2012  . Migraine 05/28/2012    is allergic to penicillins.  MEDICAL HISTORY: Past Medical History:  Diagnosis Date  . Allergy   . Anxiety   . Arthritis   . Cavitating mass in left lower lung [name]   . Complication of anesthesia    woke up during procedure  . COPD (chronic obstructive pulmonary disease) ([place])   . Depression   . Family history of adverse reaction to anesthesia    " my mother was always hard to wake up"  . Full dentures   . Headache   . Neuromuscular disorder ([name])   . Pneumonia   . Thoracic spine fracture ([name]) 11/01/2017   T9  . Wears glasses     SURGICAL HISTORY: Past Surgical History:  Procedure Laterality Date  . ELBOW SURGERY     for  "Tennis Elbow"  . hand surgery    . IR KYPHO THORACIC WITH BONE BIOPSY  11/27/2017  . IR RADIOLOGIST EVAL & MGMT  11/20/2017  . MULTIPLE TOOTH EXTRACTIONS    . TUBAL LIGATION    . VIDEO BRONCHOSCOPY N/A 08/31/2017   Procedure: VIDEO BRONCHOSCOPY;  Surgeon: Melrose Nakayama, MD;  Location: [facility] OR;  Service: Thoracic;  Laterality: N/A;    SOCIAL HISTORY: Social History   Socioeconomic History  . Marital status: Divorced    Spouse name: Not on file  . Number of children: Not on file  . Years of education: Not on file  . Highest education level: Not on file  Occupational History  . Not on file  Social Needs  . Financial resource strain: Not on file  . Food insecurity:    Worry: Not on file    Inability: Not on file  . Transportation needs:    Medical: Not on file     Non-medical: Not on file  Tobacco Use  . Smoking status: Former Smoker    Packs/day: 1.00    Years: 50.00    Pack years: 50.00    Types: Cigarettes    Last attempt to quit: 08/24/2017    Years since quitting: 0.4  . Smokeless tobacco: Never Used  Substance and Sexual Activity  . Alcohol use: No  . Drug use: No  . Sexual activity: Never  Lifestyle  . Physical activity:    Days per week: Not on file    Minutes per session: Not on file  . Stress: Not on file  Relationships  . Social connections:    Talks on phone: Not on file    Gets together: Not on file    Attends religious service: Not on file    Active member  of club or organization: Not on file    Attends meetings of clubs or organizations: Not on file    Relationship status: Not on file  . Intimate partner violence:    Fear of current or ex partner: Not on file    Emotionally abused: Not on file    Physically abused: Not on file    Forced sexual activity: Not on file  Other Topics Concern  . Not on file  Social History Narrative  . Not on file    FAMILY HISTORY: Family History  Problem Relation Age of Onset  . Diabetes Mother     Review of Systems  Constitutional: Negative for appetite change, chills, fatigue, fever and unexpected weight change.  HENT:   Negative for hearing loss, lump/mass, mouth sores and trouble swallowing.   Eyes: Negative for eye problems and icterus.  Respiratory: Negative for chest tightness, cough and shortness of breath.   Cardiovascular: Negative for chest pain, leg swelling and palpitations.  Gastrointestinal: Negative for abdominal distention, abdominal pain, constipation, diarrhea, nausea and vomiting.  Endocrine: Negative for hot flashes.  Musculoskeletal: Positive for back pain. Negative for arthralgias and gait problem.  Skin: Negative for itching and rash.  Neurological: Negative for dizziness, extremity weakness, gait problem, headaches, light-headedness, numbness and speech  difficulty.  Hematological: Negative for adenopathy.  Psychiatric/Behavioral: Negative for depression. The patient is not nervous/anxious.       PHYSICAL EXAMINATION  ECOG PERFORMANCE STATUS: 1 - Symptomatic but completely ambulatory  Vitals:   01/25/18 1302  BP: 119/65  Pulse: 83  Resp: 17  Temp: 98.5 F (36.9 C)  SpO2: 91%    Physical Exam Constitutional:      Appearance: Normal appearance.  HENT:     Head: Normocephalic.     Mouth/Throat:     Mouth: Mucous membranes are moist.     Pharynx: Oropharynx is clear. No oropharyngeal exudate.  Eyes:     General: No scleral icterus.    Pupils: Pupils are equal, round, and reactive to light.  Neck:     Musculoskeletal: Neck supple.  Cardiovascular:     Rate and Rhythm: Normal rate and regular rhythm.     Heart sounds: Normal heart sounds.  Pulmonary:     Effort: Pulmonary effort is normal.     Breath sounds: Normal breath sounds.  Abdominal:     General: Abdomen is flat. Bowel sounds are normal.     Palpations: Abdomen is soft.  Musculoskeletal:        General: No swelling.     Comments: At about T8 patient has right lateral tenderness, tenderness noted worse at right lower poster ribs as well  Lymphadenopathy:     Cervical: No cervical adenopathy.  Skin:    General: Skin is warm and dry.     Capillary Refill: Capillary refill takes less than 2 seconds.  Neurological:     General: No focal deficit present.     Mental Status: She is alert.  Psychiatric:        Mood and Affect: Mood normal.     LABORATORY DATA:  CBC    Component Value Date/Time   WBC 7.0 01/25/2018 1126   WBC 4.0 11/27/2017 1231   RBC 4.06 01/25/2018 1126   HGB 11.7 (L) 01/25/2018 1126   HCT 38.0 01/25/2018 1126   PLT 377 01/25/2018 1126   MCV 93.6 01/25/2018 1126   MCV 91.9 04/23/2012 1938   MCH 28.8 01/25/2018 1126   MCHC 30.8 01/25/2018  1126   RDW 14.4 01/25/2018 1126   LYMPHSABS 0.8 01/25/2018 1126   MONOABS 0.7 01/25/2018 1126    EOSABS 0.2 01/25/2018 1126   BASOSABS 0.0 01/25/2018 1126    CMP     Component Value Date/Time   NA 141 01/25/2018 1126   K 3.5 01/25/2018 1126   CL 105 01/25/2018 1126   CO2 27 01/25/2018 1126   GLUCOSE 95 01/25/2018 1126   BUN 10 01/25/2018 1126   CREATININE 0.68 01/25/2018 1126   CALCIUM 9.0 01/25/2018 1126   PROT 7.1 01/25/2018 1126   ALBUMIN 3.3 (L) 01/25/2018 1126   AST 13 (L) 01/25/2018 1126   ALT 7 01/25/2018 1126   ALKPHOS 109 01/25/2018 1126   BILITOT 0.3 01/25/2018 1126   GFRNONAA >60 01/25/2018 1126   GFRAA >60 01/25/2018 1126       PENDING LABS:   RADIOGRAPHIC STUDIES:  No results found.   PATHOLOGY:     ASSESSMENT and THERAPY PLAN:   Stage III squamous cell carcinoma of left lung ([place]) This is a pleasant 73 years old white female with a stage IIIa non-small cell lung cancer, poorly differentiated squamous cell carcinoma.  She has received a course of concurrent chemoradiation with weekly carboplatin and paclitaxel status post 7 cycles with partial response and is now currently undergoing consolidation treatment with immunotherapy with Imfinzi every two weeks.    1. Lung cancer: continue treatment with Imfinzi every two weeks.  Tolerating treatments well.  Labs are stable.  2.  Back pain: unclear of etiology.  Sent for xrays today.  Will continue either tylenol or percocet.  Reviewed to be cautious not to take more than 2500mg  of tylenol in 24 hours and that there is tylenol in the percocet as well in OTC tylenol.  She understands.  We talked about the role of heat and ice in helping her pain.    Patient to return in 2 weeks for labs, f/u with Cyril Mourning, and her next treatment.     Orders Placed This Encounter  Procedures  . DG Ribs Unilateral W/Chest Right    Standing Status:   Future    Standing Expiration Date:   03/26/2019    Order Specific Question:   Reason for Exam (SYMPTOM  OR DIAGNOSIS REQUIRED)    Answer:   back pain, lung cancer     Order Specific Question:   Preferred imaging location?    Answer:   [facility]    Order Specific Question:   Radiology Contrast Protocol - do NOT remove file path    Answer:   \\[email or web address]s.pdf  . DG Thoracic Spine 2 View    Standing Status:   Future    Standing Expiration Date:   01/25/2019    Order Specific Question:   Reason for Exam (SYMPTOM  OR DIAGNOSIS REQUIRED)    Answer:   right lateral thoracic pain, posterior rib pain, lung cancer    Order Specific Question:   Preferred imaging location?    Answer:   [facility]    Order Specific Question:   Radiology Contrast Protocol - do NOT remove file path    Answer:   \\[email or web address]\[email or web address]s.pdf    All questions were answered. The patient knows to call the clinic with any problems, questions or concerns. We can certainly see the patient much sooner if necessary.  A total of (30) minutes of face-to-face time was spent with this patient with greater than 50% of that time in counseling and care-coordination.  This note was electronically signed. Scot Dock, Sherry 01/25/2018

## 2018-01-25 NOTE — Telephone Encounter (Signed)
No los °

## 2018-01-25 NOTE — Patient Instructions (Signed)
[facility] Discharge Instructions for Patients Receiving Chemotherapy  Today you received the following chemotherapy agent: Durvalumab (IMFINZI).  To help prevent nausea and vomiting after your treatment, we encourage you to take your nausea medication as prescribed.   If you develop nausea and vomiting that is not controlled by your nausea medication, call the clinic.   BELOW ARE SYMPTOMS THAT SHOULD BE REPORTED IMMEDIATELY:  *FEVER GREATER THAN 100.5 F  *CHILLS WITH OR WITHOUT FEVER  NAUSEA AND VOMITING THAT IS NOT CONTROLLED WITH YOUR NAUSEA MEDICATION  *UNUSUAL SHORTNESS OF BREATH  *UNUSUAL BRUISING OR BLEEDING  TENDERNESS IN MOUTH AND THROAT WITH OR WITHOUT PRESENCE OF ULCERS  *URINARY PROBLEMS  *BOWEL PROBLEMS  UNUSUAL RASH Items with * indicate a potential emergency and should be followed up as soon as possible.  Feel free to call the clinic should you have any questions or concerns. The clinic phone number is [phone].  Please show the [facility] at check-in to the Emergency Department and triage nurse.

## 2018-01-28 ENCOUNTER — Telehealth: Payer: Self-pay

## 2018-01-28 NOTE — Telephone Encounter (Signed)
LVM for patient to call back for results.

## 2018-01-28 NOTE — Telephone Encounter (Signed)
-----   Message from Gardenia Phlegm, NP sent at 01/28/2018  3:24 PM EST ----- Please let patient know that she has a very slight compression fracture on her xrays, which is likely the cause for the pain.  I reviewed this with Dr. Julien Nordmann, and he doesn't recommend any intervention at this point.  Continue current medications.   ----- Message ----- From: Interface, Rad Results In Sent: 01/25/2018   4:47 PM EST To: Gardenia Phlegm, NP

## 2018-01-29 ENCOUNTER — Other Ambulatory Visit: Payer: Self-pay | Admitting: Medical Oncology

## 2018-01-29 ENCOUNTER — Telehealth: Payer: Self-pay | Admitting: Medical Oncology

## 2018-01-29 DIAGNOSIS — S22070A Wedge compression fracture of T9-T10 vertebra, initial encounter for closed fracture: Secondary | ICD-10-CM

## 2018-01-29 DIAGNOSIS — C3492 Malignant neoplasm of unspecified part of left bronchus or lung: Secondary | ICD-10-CM

## 2018-01-29 NOTE — Telephone Encounter (Addendum)
asking for a brace for back pain from her "new compression fracture". Per Julien Nordmann , I will refer her to PT for evaluation and treat.  Steroid injection- can she have that?   Pain med -She wants to know if it is okay to take her oxycodone ? :"Will  it affect my liver and immunotherapy?" I told her it is okay to take it to treat her pain.

## 2018-01-29 NOTE — Telephone Encounter (Signed)
-----   Message from Gardenia Phlegm, NP sent at 01/28/2018  3:24 PM EST ----- Please let patient know that she has a very slight compression fracture on her xrays, which is likely the cause for the pain.  I reviewed this with Dr. Julien Nordmann, and he doesn't recommend any intervention at this point.  Continue current medications.   ----- Message ----- From: Interface, Rad Results In Sent: 01/25/2018   4:47 PM EST To: Gardenia Phlegm, NP

## 2018-01-29 NOTE — Telephone Encounter (Signed)
Spoke with patient informing of xray results and MD recommendations.  Patient had c/o of back pain and was inquiring about getting a back brace and pain meds.  Call was forwarded to MD's nurse for follow up.

## 2018-01-30 NOTE — Telephone Encounter (Signed)
LVM for pt - Per Dr Julien Nordmann he did not refer pt to [facility] because her compression fracture is unrelated to her cancer. She needs to see her PCP for pain and brace.  [facility] notified via fax.

## 2018-01-30 NOTE — Telephone Encounter (Signed)
[facility]  referral -[facility] cannot see pt due to low staffing in the pts zip code. [facility]- I called [facility] and gave information -needs nursing for medication management and PT for evaluate and treat pt who has  new compression fracture at thoracic . Pt requests a back brace.  A nurse advocate from [facility] will call back.

## 2018-02-07 ENCOUNTER — Inpatient Hospital Stay: Payer: Medicare Other

## 2018-02-07 ENCOUNTER — Telehealth: Payer: Self-pay

## 2018-02-07 ENCOUNTER — Ambulatory Visit: Payer: Medicare Other

## 2018-02-07 ENCOUNTER — Inpatient Hospital Stay (HOSPITAL_BASED_OUTPATIENT_CLINIC_OR_DEPARTMENT_OTHER): Payer: Medicare Other | Admitting: Oncology

## 2018-02-07 ENCOUNTER — Ambulatory Visit: Payer: Medicare Other | Admitting: Oncology

## 2018-02-07 ENCOUNTER — Other Ambulatory Visit: Payer: Medicare Other

## 2018-02-07 ENCOUNTER — Telehealth: Payer: Self-pay | Admitting: Internal Medicine

## 2018-02-07 DIAGNOSIS — C3492 Malignant neoplasm of unspecified part of left bronchus or lung: Secondary | ICD-10-CM

## 2018-02-07 DIAGNOSIS — Z5112 Encounter for antineoplastic immunotherapy: Secondary | ICD-10-CM

## 2018-02-07 NOTE — Telephone Encounter (Signed)
Called patient per 1/30 sch message - unable to reach pt - left message for patient with number to call back

## 2018-02-07 NOTE — Progress Notes (Signed)
The patient was a no show for this appt.

## 2018-02-07 NOTE — Telephone Encounter (Signed)
LVM for patient to see if she is coming in today.  Requested that she call and let some one know and if she is OK.

## 2018-02-07 NOTE — Assessment & Plan Note (Signed)
This is a very pleasant 73 years old white female with a stage IIIa non-small cell lung cancer, poorly differentiated squamous cell carcinoma.   The patient underwent a course of concurrent chemoradiation with weekly carboplatin and paclitaxel status post 7 cycles with partial response. The patient is currently undergoing consolidation treatment with immunotherapy with Imfinzi status post 2 cycles.  She has been tolerating this treatment well with no concerning complaints. I recommended for the patient to proceed with cycle #3 today as scheduled. I will see the patient back for follow-up visit in 2 weeks for evaluation before the next cycle of her treatment. She was advised to call immediately if she has any concerning symptoms in the interval. The patient voices understanding of current disease status and treatment options and is in agreement with the current care plan.  All questions were answered. The patient knows to call the clinic with any problems, questions or concerns. We can certainly see the patient much sooner if necessary.

## 2018-02-11 ENCOUNTER — Telehealth: Payer: Self-pay | Admitting: *Deleted

## 2018-02-11 NOTE — Telephone Encounter (Signed)
TC from patient inquiring about upcoming appts.. Reviewed these with her.  She has labs, MD and treatment on the 13th of February.  Her main concern is back pain from new fracture @ T10. She states pain is excrucitiating. Current pain meds only last for about 20 minutes. She doesn't know what to do at this point.  She had kyphoplasty done in another area in November 2019

## 2018-02-11 NOTE — Telephone Encounter (Signed)
She probably will need another kyphoplasty.  She can get a referral from her primary care physician to interventional neurology or we can do the referral when I see her.  Thank you.

## 2018-02-12 ENCOUNTER — Telehealth: Payer: Self-pay | Admitting: Medical Oncology

## 2018-02-12 NOTE — Telephone Encounter (Signed)
LVM "She probably will need another kyphoplasty.  She can get a referral from her primary care physician to interventional neurology or we can do the referral when I see her."

## 2018-02-18 ENCOUNTER — Other Ambulatory Visit: Payer: Self-pay | Admitting: Nurse Practitioner

## 2018-02-18 DIAGNOSIS — S22000A Wedge compression fracture of unspecified thoracic vertebra, initial encounter for closed fracture: Secondary | ICD-10-CM

## 2018-02-21 ENCOUNTER — Inpatient Hospital Stay: Payer: Medicare Other

## 2018-02-21 ENCOUNTER — Inpatient Hospital Stay: Payer: Medicare Other | Admitting: Nutrition

## 2018-02-21 ENCOUNTER — Encounter: Payer: Self-pay | Admitting: Internal Medicine

## 2018-02-21 ENCOUNTER — Inpatient Hospital Stay: Payer: Medicare Other | Attending: Nurse Practitioner | Admitting: Internal Medicine

## 2018-02-21 VITALS — BP 112/61 | HR 78 | Temp 98.4°F | Resp 18 | Ht 60.0 in | Wt 71.8 lb

## 2018-02-21 DIAGNOSIS — C3492 Malignant neoplasm of unspecified part of left bronchus or lung: Secondary | ICD-10-CM

## 2018-02-21 DIAGNOSIS — Z923 Personal history of irradiation: Secondary | ICD-10-CM

## 2018-02-21 DIAGNOSIS — M545 Low back pain: Secondary | ICD-10-CM | POA: Insufficient documentation

## 2018-02-21 DIAGNOSIS — Z5112 Encounter for antineoplastic immunotherapy: Secondary | ICD-10-CM | POA: Insufficient documentation

## 2018-02-21 DIAGNOSIS — C3432 Malignant neoplasm of lower lobe, left bronchus or lung: Secondary | ICD-10-CM | POA: Insufficient documentation

## 2018-02-21 DIAGNOSIS — Z79899 Other long term (current) drug therapy: Secondary | ICD-10-CM | POA: Diagnosis not present

## 2018-02-21 DIAGNOSIS — Z9221 Personal history of antineoplastic chemotherapy: Secondary | ICD-10-CM | POA: Insufficient documentation

## 2018-02-21 LAB — CMP (CANCER CENTER ONLY)
ALK PHOS: 110 U/L (ref 38–126)
ALT: 7 U/L (ref 0–44)
AST: 18 U/L (ref 15–41)
Albumin: 3.5 g/dL (ref 3.5–5.0)
Anion gap: 9 (ref 5–15)
BUN: 13 mg/dL (ref 8–23)
CO2: 29 mmol/L (ref 22–32)
Calcium: 9.4 mg/dL (ref 8.9–10.3)
Chloride: 102 mmol/L (ref 98–111)
Creatinine: 0.75 mg/dL (ref 0.44–1.00)
GFR, Est AFR Am: >60 mL/min (ref >60)
GFR, Estimated: >60 mL/min (ref >60)
Glucose, Bld: 87 mg/dL (ref 70–99)
Potassium: 3.7 mmol/L (ref 3.5–5.1)
Sodium: 140 mmol/L (ref 135–145)
Total Bilirubin: 0.3 mg/dL (ref 0.3–1.2)
Total Protein: 7.2 g/dL (ref 6.5–8.1)

## 2018-02-21 LAB — CBC WITH DIFFERENTIAL (CANCER CENTER ONLY)
Abs Immature Granulocytes: 0.01 10*3/uL (ref 0.00–0.07)
Basophils Absolute: 0.1 10*3/uL (ref 0.0–0.1)
Basophils Relative: 1 %
Eosinophils Absolute: 0.1 10*3/uL (ref 0.0–0.5)
Eosinophils Relative: 2 %
HCT: 39.1 % (ref 36.0–46.0)
Hemoglobin: 12 g/dL (ref 12.0–15.0)
Immature Granulocytes: 0 %
Lymphocytes Relative: 17 %
Lymphs Abs: 0.9 10*3/uL (ref 0.7–4.0)
MCH: 28 pg (ref 26.0–34.0)
MCHC: 30.7 g/dL (ref 30.0–36.0)
MCV: 91.1 fL (ref 80.0–100.0)
MONO ABS: 0.8 10*3/uL (ref 0.1–1.0)
Monocytes Relative: 15 %
Neutro Abs: 3.4 10*3/uL (ref 1.7–7.7)
Neutrophils Relative %: 65 %
Platelet Count: 288 10*3/uL (ref 150–400)
RBC: 4.29 MIL/uL (ref 3.87–5.11)
RDW: 14.3 % (ref 11.5–15.5)
WBC Count: 5.3 10*3/uL (ref 4.0–10.5)
nRBC: 0 % (ref 0.0–0.2)

## 2018-02-21 LAB — TSH: TSH: 3.097 u[IU]/mL (ref 0.308–3.960)

## 2018-02-21 MED ORDER — SODIUM CHLORIDE 0.9 % IV SOLN
Freq: Once | INTRAVENOUS | Status: AC
  Administered 2018-02-21: 14:00:00 via INTRAVENOUS
  Filled 2018-02-21: qty 250

## 2018-02-21 MED ORDER — SODIUM CHLORIDE 0.9 % IV SOLN
10.5000 mg/kg | Freq: Once | INTRAVENOUS | Status: AC
  Administered 2018-02-21: 360 mg via INTRAVENOUS
  Filled 2018-02-21: qty 7.2

## 2018-02-21 MED ORDER — OXYCODONE-ACETAMINOPHEN 5-325 MG PO TABS: 1.0000 | ORAL_TABLET | Freq: Three times a day (TID) | ORAL | 0 refills | Status: DC | PRN

## 2018-02-21 NOTE — Progress Notes (Signed)
[place] [phone]   [phone]  OFFICE PROGRESS NOTE  [name], NP [place] [address]  DIAGNOSIS: Stage IIb/IIIa (T3, N0/N1, M0) non-small cell lung cancer poorly differentiated squamous cell carcinoma diagnosed in August 2019.  The patient presented with large superior segment left lower lobe mass with questionable left hilar adenopathy.  PRIOR THERAPY: Concurrent chemoradiation with weekly carboplatin for AUC of 2 and paclitaxel 45 mg/M2.  Status post 7 cycles.  Last dose was given October 29, 2017 with partial response.  CURRENT THERAPY: Consolidation treatment with immunotherapy with Imfinzi 10 mg/KG every 2 weeks to start December 13, 2017.  Status post 4 cycles.  INTERVAL HISTORY: Sherry [name] 73 y.o. female returns to the clinic today for follow-up visit.  The patient is feeling fine today with no concerning complaints except for the back pain secondary to compression fracture.  She is scheduled to see interventional radiology next week for evaluation and management of her condition.  She is requesting refill of her pain medication.  She denied having any current chest pain, shortness of breath, cough or hemoptysis.  She denied having any fever or chills.  She has no nausea, vomiting, diarrhea or constipation.  She has been tolerating her treatment with Imfinzi fairly well.   MEDICAL HISTORY: Past Medical History:  Diagnosis Date  . Allergy   . Anxiety   . Arthritis   . Cavitating mass in left lower lung lobe   . Complication of anesthesia    woke up during procedure  . COPD (chronic obstructive pulmonary disease) ([place])   . Depression   . Family history of adverse reaction to anesthesia    " my mother was always hard to wake up"  . Full dentures   . Headache   . Neuromuscular disorder ([place])   . Pneumonia   . Thoracic spine fracture ([place]) 11/01/2017   T9  . Wears glasses     ALLERGIES:   is allergic to penicillins.  MEDICATIONS:  Current Outpatient Medications  Medication Sig Dispense Refill  . acetaminophen (TYLENOL 8 HOUR) 650 MG CR tablet Take 1 tablet (650 mg total) by mouth every 8 (eight) hours as needed for pain or fever. 30 tablet 0  . cyanocobalamin (,VITAMIN B-12,) 1000 MCG/ML injection Inject 1,000 mcg into the muscle every 30 (thirty) days. Administered by Dr. Eldridge Abrahams    . guaifenesin (ROBITUSSIN) 100 MG/5ML syrup Take by mouth daily.    . Menthol-Ascorbic Acid (LUDENS COUGH DROPS MT) Use as directed 1 lozenge in the mouth or throat 3 (three) times daily.     . Multiple Vitamin (MULTIVITAMIN WITH MINERALS) TABS tablet Take 1 tablet by mouth daily.    Marland Kitchen oxyCODONE-acetaminophen (PERCOCET/ROXICET) 5-325 MG tablet Take 1 tablet by mouth every 8 (eight) hours as needed for severe pain. 3 tablet 0   No current facility-administered medications for this visit.     SURGICAL HISTORY:  Past Surgical History:  Procedure Laterality Date  . ELBOW SURGERY     for  "Tennis Elbow"  . hand surgery    . IR KYPHO THORACIC WITH BONE BIOPSY  11/27/2017  . IR RADIOLOGIST EVAL & MGMT  11/20/2017  . MULTIPLE TOOTH EXTRACTIONS    . TUBAL LIGATION    . VIDEO BRONCHOSCOPY N/A 08/31/2017   Procedure: VIDEO BRONCHOSCOPY;  Surgeon: Melrose Nakayama, MD;  Location: [facility];  Service: Thoracic;  Laterality: N/A;  REVIEW OF SYSTEMS:  A comprehensive review of systems was negative except for: Musculoskeletal: positive for back pain   PHYSICAL EXAMINATION: General appearance: alert, cooperative and no distress Head: Normocephalic, without obvious abnormality, atraumatic Neck: no adenopathy, no JVD, supple, symmetrical, trachea midline and thyroid not enlarged, symmetric, no tenderness/mass/nodules Lymph nodes: Cervical, supraclavicular, and axillary nodes normal. Resp: clear to auscultation bilaterally Back: symmetric, no curvature. ROM normal. No CVA tenderness. Cardio:  regular rate and rhythm, S1, S2 normal, no murmur, click, rub or gallop GI: soft, non-tender; bowel sounds normal; no masses,  no organomegaly Extremities: extremities normal, atraumatic, no cyanosis or edema  ECOG PERFORMANCE STATUS: 1 - Symptomatic but completely ambulatory  Blood pressure 112/61, pulse 78, temperature 98.4 F (36.9 C), temperature source Oral, resp. rate 18, height 5' (1.524 m), weight 71 lb 12.8 oz (32.6 kg), SpO2 93 %.  LABORATORY DATA: Lab Results  Component Value Date   WBC 5.3 02/21/2018   HGB 12.0 02/21/2018   HCT 39.1 02/21/2018   MCV 91.1 02/21/2018   PLT 288 02/21/2018      Chemistry      Component Value Date/Time   NA 141 01/25/2018 1126   K 3.5 01/25/2018 1126   CL 105 01/25/2018 1126   CO2 27 01/25/2018 1126   BUN 10 01/25/2018 1126   CREATININE 0.68 01/25/2018 1126      Component Value Date/Time   CALCIUM 9.0 01/25/2018 1126   ALKPHOS 109 01/25/2018 1126   AST 13 (L) 01/25/2018 1126   ALT 7 01/25/2018 1126   BILITOT 0.3 01/25/2018 1126       RADIOGRAPHIC STUDIES: Dg Ribs Unilateral W/chest Right  Result Date: 01/25/2018 CLINICAL DATA:  Initial evaluation for acute right flank pain, history of lung cancer. EXAM: RIGHT RIBS AND CHEST - 3+ VIEW COMPARISON:  Prior radiograph from 12/11/2017 FINDINGS: Cardiac and mediastinal silhouettes are stable in size and contour, and remain within normal limits. Lungs are hyperinflated with associated emphysematous changes. Small left pleural effusion present. Oblique linear opacity within the left perihilar region could reflect postobstructive atelectasis and/or consolidation. Suggestion of possible superimposed 12 mm nodular density within this region. No other consolidative opacity. No pulmonary edema. No pneumothorax. Dedicated views of the right ribs were performed. Metallic BB marker overlies the lower right ribs at site of pain. No acute osseous abnormality. Sequelae of prior vertebral augmentation  noted within the midthoracic spine. Scoliosis. IMPRESSION: 1. No acute osseous abnormality identified. 2. Small left pleural effusion. 3. Oblique linear opacity within the mid left lung, which could reflect postobstructive atelectasis and/or consolidation. Possible superimposed nodular density as above. Overall, appearance is new/progressed relative to most recent chest radiograph from 12/11/2017. Electronically Signed   By: Jeannine Boga M.D.   On: 01/25/2018 16:57   Dg Thoracic Spine 2 View  Result Date: 01/25/2018 CLINICAL DATA:  Right flank pain beginning 5 days ago. History of squamous cell lung cancer. EXAM: THORACIC SPINE 2 VIEWS COMPARISON:  12/11/2017 FINDINGS: Chronic scoliosis. Previous compression fracture of T9 with vertebral augmentation. There appears to be a new fracture at T10 with loss of height of about 10%. IMPRESSION: New fracture at T10 with loss of height of about 10%. Electronically Signed   By: Nelson Chimes M.D.   On: 01/25/2018 16:45    ASSESSMENT AND PLAN: This is a very pleasant 73 years old white female with a stage IIIa non-small cell lung cancer, poorly differentiated squamous cell carcinoma.   The patient underwent a course of concurrent  chemoradiation with weekly carboplatin and paclitaxel status post 7 cycles with partial response. The patient is currently undergoing consolidation treatment with immunotherapy with Imfinzi status post 4 cycles.   The patient has been tolerating the consolidation treatment with Imfinzi fairly well. I recommended for her to proceed with cycle #5 today as scheduled. I will see her back for follow-up visit in 2 weeks for evaluation before starting cycle #6. For the back pain from the compression fracture, I gave her refill of her pain medication temporarily for now until she has evaluation by interventional radiology. She was advised to call immediately if she has any concerning symptoms in the interval. The patient voices  understanding of current disease status and treatment options and is in agreement with the current care plan. All questions were answered. The patient knows to call the clinic with any problems, questions or concerns. We can certainly see the patient much sooner if necessary.  I spent 10 minutes counseling the patient face to face. The total time spent in the appointment was 15 minutes.  Disclaimer: This note was dictated with voice recognition software. Similar sounding words can inadvertently be transcribed and may not be corrected upon review.

## 2018-02-21 NOTE — Patient Instructions (Signed)
[facility] Discharge Instructions for Patients Receiving Chemotherapy  Today you received the following chemotherapy agents: Imfinzi.  To help prevent nausea and vomiting after your treatment, we encourage you to take your nausea medication as directed.   If you develop nausea and vomiting that is not controlled by your nausea medication, call the clinic.   BELOW ARE SYMPTOMS THAT SHOULD BE REPORTED IMMEDIATELY:  *FEVER GREATER THAN 100.5 F  *CHILLS WITH OR WITHOUT FEVER  NAUSEA AND VOMITING THAT IS NOT CONTROLLED WITH YOUR NAUSEA MEDICATION  *UNUSUAL SHORTNESS OF BREATH  *UNUSUAL BRUISING OR BLEEDING  TENDERNESS IN MOUTH AND THROAT WITH OR WITHOUT PRESENCE OF ULCERS  *URINARY PROBLEMS  *BOWEL PROBLEMS  UNUSUAL RASH Items with * indicate a potential emergency and should be followed up as soon as possible.  Feel free to call the clinic should you have any questions or concerns. The clinic phone number is [phone].  Please show the CHEMO ALERT CARD at check-in to the Emergency Department and triage nurse.   

## 2018-02-21 NOTE — Progress Notes (Signed)
Nutrition follow-up completed with patient during infusion for lung cancer. Weight was documented as 71 pounds February 13.  This is decreased from 76.1 pounds January 17. Patient reports she has increased pain and sleeps a lot. This has affected her ability to consume adequate nutrition. She denies other nutrition impact symptoms. Reports she does not tolerate oral nutrition supplements because they are too sweet.  Nutrition diagnosis: Severe malnutrition continues.  Intervention: Educated patient on strategies to increase calories and protein with foods that she likes. Recommended small frequent meals and snacks. Encourage patient to try adding freeze-dried coffee to oral nutrition supplements to decrease weakness. Teach back method used.  Questions were answered.  Monitoring, evaluation, goals: Patient will tolerate increased calories and protein to minimize further weight loss.  Next visit: To be scheduled as needed.  **Disclaimer: This note was dictated with voice recognition software. Similar sounding words can inadvertently be transcribed and this note may contain transcription errors which may not have been corrected upon publication of note.**

## 2018-02-26 ENCOUNTER — Encounter: Payer: Self-pay | Admitting: Radiology

## 2018-02-26 ENCOUNTER — Ambulatory Visit (HOSPITAL_COMMUNITY)
Admission: RE | Admit: 2018-02-26 | Discharge: 2018-02-26 | Disposition: A | Discharge status: Home / Self Care | Payer: Medicare Other | Source: Ambulatory Visit | Attending: Diagnostic Radiology | Admitting: Diagnostic Radiology

## 2018-02-26 ENCOUNTER — Ambulatory Visit
Admission: RE | Admit: 2018-02-26 | Discharge: 2018-02-26 | Disposition: A | Discharge status: Home / Self Care | Payer: Medicare Other | Source: Ambulatory Visit | Attending: Nurse Practitioner | Admitting: Nurse Practitioner

## 2018-02-26 ENCOUNTER — Other Ambulatory Visit: Payer: Self-pay | Admitting: Diagnostic Radiology

## 2018-02-26 DIAGNOSIS — S22000A Wedge compression fracture of unspecified thoracic vertebra, initial encounter for closed fracture: Secondary | ICD-10-CM | POA: Insufficient documentation

## 2018-02-26 DIAGNOSIS — S32000A Wedge compression fracture of unspecified lumbar vertebra, initial encounter for closed fracture: Secondary | ICD-10-CM | POA: Insufficient documentation

## 2018-02-26 HISTORY — PX: IR RADIOLOGIST EVAL & MGMT: IMG5224

## 2018-02-26 MED ORDER — GADOBUTROL 1 MMOL/ML IV SOLN
3.0000 mL | Freq: Once | INTRAVENOUS | Status: AC | PRN
  Administered 2018-02-26: 3 mL via INTRAVENOUS

## 2018-02-26 NOTE — Progress Notes (Signed)
Referring Physician(s): Jones,Penny L  Chief Complaint: The patient is seen in follow up today s/p 11/27/17: KYPHOPLASTY AT THORACIC T9 Also presenting with new pain x 2-3 weeks  History of present illness:  Known Hx Lung Ca  CLINICAL DATA:  Subacute painful T9 compression fracture deformity. History of lung carcinoma.  Pt here today with new pain in mid to low back States she did very well post 11/19 T9 KP Pain was well controlled. Noticed few weeks ago sudden onset new back pain after lifting bag of groceries and turning. Pain took breath and can make her sick. Using Percocet daily-- relieves pain maybe 20 minutes and pain begins to worsen again She can sleep after she can get into a certain position Pain is "horrible" "needs something done asap"  Plain films Thoracic spine 01/25/18: IMPRESSION: New fracture at T10 with loss of height of about 10%.  Here today to evaluate back pain and possible treatment  Past Medical History:  Diagnosis Date  . Allergy   . Anxiety   . Arthritis   . Cavitating mass in left lower lung lobe   . Complication of anesthesia    woke up during procedure  . COPD (chronic obstructive pulmonary disease) ([place])   . Depression   . Family history of adverse reaction to anesthesia    " my mother was always hard to wake up"  . Full dentures   . Headache   . Neuromuscular disorder ([place])   . Pneumonia   . Thoracic spine fracture ([place]) 11/01/2017   T9  . Wears glasses     Past Surgical History:  Procedure Laterality Date  . ELBOW SURGERY     for  "Tennis Elbow"  . hand surgery    . IR KYPHO THORACIC WITH BONE BIOPSY  11/27/2017  . IR RADIOLOGIST EVAL & MGMT  11/20/2017  . IR RADIOLOGIST EVAL & MGMT  02/26/2018  . MULTIPLE TOOTH EXTRACTIONS    . TUBAL LIGATION    . VIDEO BRONCHOSCOPY N/A 08/31/2017   Procedure: VIDEO BRONCHOSCOPY;  Surgeon: Melrose Nakayama, MD;  Location: [facility] OR;  Service: Thoracic;  Laterality: N/A;     Allergies: Penicillins  Medications: Prior to Admission medications   Medication Sig Start Date End Date Taking? Authorizing Provider  acetaminophen (TYLENOL 8 HOUR) 650 MG CR tablet Take 1 tablet (650 mg total) by mouth every 8 (eight) hours as needed for pain or fever. 10/30/17   Varney Biles, MD  cyanocobalamin (,VITAMIN B-12,) 1000 MCG/ML injection Inject 1,000 mcg into the muscle every 30 (thirty) days. Administered by Dr. Eldridge Abrahams    [provider]  guaifenesin (ROBITUSSIN) 100 MG/5ML syrup Take by mouth daily.    [provider]  Menthol-Ascorbic Acid (LUDENS COUGH DROPS MT) Use as directed 1 lozenge in the mouth or throat 3 (three) times daily.     [provider]  Multiple Vitamin (MULTIVITAMIN WITH MINERALS) TABS tablet Take 1 tablet by mouth daily.    [provider]  oxyCODONE-acetaminophen (PERCOCET/ROXICET) 5-325 MG tablet Take 1 tablet by mouth every 8 (eight) hours as needed for severe pain. 02/21/18   Curt Bears, MD     Family History  Problem Relation Age of Onset  . Diabetes Mother     Social History   Socioeconomic History  . Marital status: Divorced    Spouse name: Not on file  . Number of children: Not on file  . Years of education: Not on file  . Highest  education level: Not on file  Occupational History  . Not on file  Social Needs  . Financial resource strain: Not on file  . Food insecurity:    Worry: Not on file    Inability: Not on file  . Transportation needs:    Medical: Not on file    Non-medical: Not on file  Tobacco Use  . Smoking status: Former Smoker    Packs/day: 1.00    Years: 50.00    Pack years: 50.00    Types: Cigarettes    Last attempt to quit: 08/24/2017    Years since quitting: 0.5  . Smokeless tobacco: Never Used  Substance and Sexual Activity  . Alcohol use: No  . Drug use: No  . Sexual activity: Never  Lifestyle  . Physical activity:    Days per week: Not on file     Minutes per session: Not on file  . Stress: Not on file  Relationships  . Social connections:    Talks on phone: Not on file    Gets together: Not on file    Attends religious service: Not on file    Active member of club or organization: Not on file    Attends meetings of clubs or organizations: Not on file    Relationship status: Not on file  Other Topics Concern  . Not on file  Social History Narrative  . Not on file     Vital Signs: BP (!) 111/55   Pulse 98   Temp 98.3 F (36.8 C) (Oral)   Resp 15   Ht 5' (1.524 m)   Wt 71 lb (32.2 kg)   SpO2 95%   BMI 13.87 kg/m   Physical Exam Vitals signs reviewed.  Musculoskeletal: Normal range of motion.     Comments: Pain is elicited with palpation at T10 area but also lower in spine Lumbar area painful  Skin:    General: Skin is warm and dry.  Neurological:     Mental Status: She is alert and oriented to person, place, and time.  Psychiatric:        Behavior: Behavior normal.     Imaging: Ir Radiologist Eval & Mgmt  Result Date: 02/26/2018 Please refer to notes tab for details about interventional procedure. (Op Note)   Labs:  CBC: Recent Labs    12/27/17 1405 01/10/18 1115 01/25/18 1126 02/21/18 1127  WBC 6.5 6.6 7.0 5.3  HGB 12.1 10.8* 11.7* 12.0  HCT 39.5 35.4* 38.0 39.1  PLT 346 380 377 288    COAGS: Recent Labs    08/31/17 1142 11/27/17 1231  INR 1.04 0.85  APTT 32  --     BMP: Recent Labs    12/27/17 1405 01/10/18 1115 01/25/18 1126 02/21/18 1127  NA 143 142 141 140  K 3.8 3.6 3.5 3.7  CL 104 105 105 102  CO2 28 29 27 29   GLUCOSE 89 95 95 87  BUN 9 10 10 13   CALCIUM 9.5 8.8* 9.0 9.4  CREATININE 0.70 0.70 0.68 0.75  GFRNONAA >60 >60 >60 >60  GFRAA >60 >60 >60 >60    LIVER FUNCTION TESTS: Recent Labs    12/27/17 1405 01/10/18 1115 01/25/18 1126 02/21/18 1127  BILITOT 0.4 0.4 0.3 0.3  AST 20 15 13* 18  ALT 12 9 7 7   ALKPHOS 130* 101 109 110  PROT 7.7 6.8 7.1 7.2   ALBUMIN 3.5 3.0* 3.3* 3.5    Assessment:  Dr Anselm Pancoast evaluates and examines  pt today. Definite pain at T10 and in lower- Lumbar area T9 fracture was determined with CT scan; but with possible new fracture and new Lumbar involvement; along with Cancer history-- MRI is best for imaging choice. She is agreeable Will need Valium for same. Thoracic and Lumbar MRI w and wo Cx has been scheduled for this evening at 7pm- [facility]. Valium 5 mg #1 po Rx given pt We will contact pt asap with results-- she is willing to have procedure performed by first IR Rad available   Signed: Lavonia Drafts, PA-C 02/26/2018, 1:51 PM   Please refer to Dr. Anselm Pancoast attestation of this note for management and plan.

## 2018-02-27 ENCOUNTER — Other Ambulatory Visit: Payer: Self-pay | Admitting: Radiology

## 2018-02-27 ENCOUNTER — Other Ambulatory Visit (HOSPITAL_COMMUNITY): Payer: Self-pay | Admitting: Diagnostic Radiology

## 2018-02-27 DIAGNOSIS — S22070A Wedge compression fracture of T9-T10 vertebra, initial encounter for closed fracture: Secondary | ICD-10-CM

## 2018-02-28 ENCOUNTER — Other Ambulatory Visit: Payer: Self-pay | Admitting: Radiology

## 2018-03-01 ENCOUNTER — Other Ambulatory Visit: Payer: Self-pay

## 2018-03-01 ENCOUNTER — Encounter (HOSPITAL_COMMUNITY): Payer: Self-pay

## 2018-03-01 ENCOUNTER — Ambulatory Visit (HOSPITAL_COMMUNITY)
Admission: RE | Admit: 2018-03-01 | Discharge: 2018-03-01 | Disposition: A | Discharge status: Home / Self Care | Payer: Medicare Other | Source: Ambulatory Visit | Attending: Diagnostic Radiology | Admitting: Diagnostic Radiology

## 2018-03-01 DIAGNOSIS — Z9851 Tubal ligation status: Secondary | ICD-10-CM | POA: Insufficient documentation

## 2018-03-01 DIAGNOSIS — Z800 Family history of malignant neoplasm of digestive organs: Secondary | ICD-10-CM | POA: Insufficient documentation

## 2018-03-01 DIAGNOSIS — S22070A Wedge compression fracture of T9-T10 vertebra, initial encounter for closed fracture: Secondary | ICD-10-CM | POA: Diagnosis present

## 2018-03-01 DIAGNOSIS — G709 Myoneural disorder, unspecified: Secondary | ICD-10-CM | POA: Insufficient documentation

## 2018-03-01 DIAGNOSIS — J449 Chronic obstructive pulmonary disease, unspecified: Secondary | ICD-10-CM | POA: Insufficient documentation

## 2018-03-01 DIAGNOSIS — X58XXXA Exposure to other specified factors, initial encounter: Secondary | ICD-10-CM | POA: Insufficient documentation

## 2018-03-01 DIAGNOSIS — M1990 Unspecified osteoarthritis, unspecified site: Secondary | ICD-10-CM | POA: Diagnosis not present

## 2018-03-01 DIAGNOSIS — Z87891 Personal history of nicotine dependence: Secondary | ICD-10-CM | POA: Diagnosis not present

## 2018-03-01 HISTORY — PX: IR KYPHO THORACIC WITH BONE BIOPSY: IMG5518

## 2018-03-01 LAB — CBC WITH DIFFERENTIAL/PLATELET
Abs Immature Granulocytes: 0.02 10*3/uL (ref 0.00–0.07)
Basophils Absolute: 0.1 10*3/uL (ref 0.0–0.1)
Basophils Relative: 1 %
EOS PCT: 3 %
Eosinophils Absolute: 0.2 10*3/uL (ref 0.0–0.5)
HCT: 39.7 % (ref 36.0–46.0)
HEMOGLOBIN: 12.1 g/dL (ref 12.0–15.0)
Immature Granulocytes: 0 %
LYMPHS PCT: 18 %
Lymphs Abs: 1 10*3/uL (ref 0.7–4.0)
MCH: 27.8 pg (ref 26.0–34.0)
MCHC: 30.5 g/dL (ref 30.0–36.0)
MCV: 91.3 fL (ref 80.0–100.0)
Monocytes Absolute: 0.6 10*3/uL (ref 0.1–1.0)
Monocytes Relative: 11 %
Neutro Abs: 3.5 10*3/uL (ref 1.7–7.7)
Neutrophils Relative %: 67 %
Platelets: 333 10*3/uL (ref 150–400)
RBC: 4.35 MIL/uL (ref 3.87–5.11)
RDW: 14.6 % (ref 11.5–15.5)
WBC: 5.3 10*3/uL (ref 4.0–10.5)
nRBC: 0 % (ref 0.0–0.2)

## 2018-03-01 LAB — URINALYSIS, ROUTINE W REFLEX MICROSCOPIC
BILIRUBIN URINE: NEGATIVE
Glucose, UA: NEGATIVE mg/dL
Hgb urine dipstick: NEGATIVE
Ketones, ur: NEGATIVE mg/dL
Leukocytes,Ua: NEGATIVE
NITRITE: NEGATIVE
Protein, ur: NEGATIVE mg/dL
Specific Gravity, Urine: 1.008 (ref 1.005–1.030)
pH: 7 (ref 5.0–8.0)

## 2018-03-01 LAB — BASIC METABOLIC PANEL
Anion gap: 7 (ref 5–15)
BUN: 8 mg/dL (ref 8–23)
CO2: 29 mmol/L (ref 22–32)
Calcium: 9.3 mg/dL (ref 8.9–10.3)
Chloride: 104 mmol/L (ref 98–111)
Creatinine, Ser: 0.71 mg/dL (ref 0.44–1.00)
GFR calc Af Amer: >60 mL/min (ref >60)
GFR calc non Af Amer: >60 mL/min (ref >60)
Glucose, Bld: 89 mg/dL (ref 70–99)
Potassium: 3.4 mmol/L — ABNORMAL LOW (ref 3.5–5.1)
Sodium: 140 mmol/L (ref 135–145)

## 2018-03-01 LAB — PROTIME-INR
INR: 0.94
Prothrombin Time: 12.4 seconds (ref 11.4–15.2)

## 2018-03-01 MED ORDER — FENTANYL CITRATE (PF) 100 MCG/2ML IJ SOLN
INTRAMUSCULAR | Status: AC
  Filled 2018-03-01: qty 2

## 2018-03-01 MED ORDER — MIDAZOLAM HCL 2 MG/2ML IJ SOLN
INTRAMUSCULAR | Status: AC
  Filled 2018-03-01: qty 2

## 2018-03-01 MED ORDER — IOPAMIDOL (ISOVUE-300) INJECTION 61%
INTRAVENOUS | Status: AC
  Administered 2018-03-01: 10 mL
  Filled 2018-03-01: qty 50

## 2018-03-01 MED ORDER — LIDOCAINE HCL 1 % IJ SOLN
INTRAMUSCULAR | Status: AC
  Filled 2018-03-01: qty 20

## 2018-03-01 MED ORDER — MIDAZOLAM HCL 2 MG/2ML IJ SOLN
INTRAMUSCULAR | Status: DC | PRN
  Administered 2018-03-01: 0.5 mg via INTRAVENOUS
  Administered 2018-03-01: 1 mg via INTRAVENOUS

## 2018-03-01 MED ORDER — OXYCODONE-ACETAMINOPHEN 5-325 MG PO TABS
ORAL_TABLET | ORAL | Status: AC
  Filled 2018-03-01: qty 2

## 2018-03-01 MED ORDER — OXYCODONE-ACETAMINOPHEN 5-325 MG PO TABS
2.0000 | ORAL_TABLET | Freq: Once | ORAL | Status: AC
  Administered 2018-03-01: 2 via ORAL

## 2018-03-01 MED ORDER — LIDOCAINE HCL (PF) 1 % IJ SOLN
INTRAMUSCULAR | Status: DC | PRN
  Administered 2018-03-01: 10 mL

## 2018-03-01 MED ORDER — FENTANYL CITRATE (PF) 100 MCG/2ML IJ SOLN
INTRAMUSCULAR | Status: DC | PRN
  Administered 2018-03-01 (×2): 25 ug via INTRAVENOUS

## 2018-03-01 MED ORDER — CLINDAMYCIN PHOSPHATE 900 MG/50ML IV SOLN
900.0000 mg | Freq: Once | INTRAVENOUS | Status: AC
  Administered 2018-03-01: 900 mg via INTRAVENOUS
  Filled 2018-03-01: qty 50

## 2018-03-01 MED ORDER — SODIUM CHLORIDE 0.9 % IV SOLN
INTRAVENOUS | Status: DC
  Administered 2018-03-01: 11:00:00 via INTRAVENOUS

## 2018-03-01 NOTE — Progress Notes (Signed)
Dr Vernard Gambles paged due to pt c/o pain rates as a 10 on the pain scale.

## 2018-03-01 NOTE — Progress Notes (Signed)
Dr Vernard Gambles spoke with pt and family.  Pt states she is feeling better now. OK to discharge per Dr Vernard Gambles

## 2018-03-01 NOTE — Progress Notes (Signed)
Sherry [name], [place] paged due to pt having pain.

## 2018-03-01 NOTE — Progress Notes (Signed)
PA paged due to pt C/o pain rates as a 10 on the pain scale

## 2018-03-01 NOTE — H&P (Addendum)
Chief Complaint: Patient was seen in consultation today for Thoracic 10 kyphoplasty at the request of  Dr Eldridge Abrahams  Supervising Physician: Arne Cleveland  Patient Status: [facility] - Out-pt  History of Present Illness: Sherry [name] is a 73 y.o. female   Known to IR T9 KP 11/27/17 "instant relief of pain" per pt Has now had new pain since she lifted grocery bag few weeks ago Pain is severe Makes her sick Percocet regularly helps little to none Was sen in [facility] Tuesday this week MRI performed that night: IMPRESSION: Status post T9 kyphoplasty, for benign osteopenic compression fracture, with preserved vertebral body height. New T10 compression fracture, slightly progressed from plain films, slight anterior wedging, consistent with an adjacent segment injury. Vertebral augmentation at T10 may be warranted. Although there are no worrisome imaging features, tissue sampling could be performed as clinically indicated. No features worrisome for epidural tumor or epidural hematoma. Normal cord signal and morphology throughout. Unremarkable lumbar spine MRI. No compression deformities. No features worrisome for metastatic disease to the lumbosacral spine.  Now scheduled for T 10 fracture kyphoplasty  Past Medical History:  Diagnosis Date  . Allergy   . Anxiety   . Arthritis   . Cavitating mass in left lower lung lobe   . Complication of anesthesia    woke up during procedure  . COPD (chronic obstructive pulmonary disease) ([place])   . Depression   . Family history of adverse reaction to anesthesia    " my mother was always hard to wake up"  . Full dentures   . Headache   . Neuromuscular disorder ([place])   . Pneumonia   . Thoracic spine fracture ([place]) 11/01/2017   T9  . Wears glasses     Past Surgical History:  Procedure Laterality Date  . ELBOW SURGERY     for  "Tennis Elbow"  . hand surgery    . IR KYPHO THORACIC WITH BONE BIOPSY  11/27/2017  . IR  RADIOLOGIST EVAL & MGMT  11/20/2017  . IR RADIOLOGIST EVAL & MGMT  02/26/2018  . MULTIPLE TOOTH EXTRACTIONS    . TUBAL LIGATION    . VIDEO BRONCHOSCOPY N/A 08/31/2017   Procedure: VIDEO BRONCHOSCOPY;  Surgeon: Melrose Nakayama, MD;  Location: [facility] OR;  Service: Thoracic;  Laterality: N/A;    Allergies: Penicillins  Medications: Prior to Admission medications   Medication Sig Start Date End Date Taking? Authorizing Provider  acetaminophen (TYLENOL 8 HOUR) 650 MG CR tablet Take 1 tablet (650 mg total) by mouth every 8 (eight) hours as needed for pain or fever. 10/30/17  Yes Nanavati, Ankit, MD  cyanocobalamin (,VITAMIN B-12,) 1000 MCG/ML injection Inject 1,000 mcg into the muscle See admin instructions. Administered by Dr. Eldridge Abrahams  - every other month (per pt)   Yes [provider]  guaifenesin (ROBITUSSIN) 100 MG/5ML syrup Take 200 mg by mouth 3 (three) times daily as needed for cough.    Yes [provider]  Menthol-Ascorbic Acid (LUDENS COUGH DROPS MT) Use as directed 1 lozenge in the mouth or throat 3 (three) times daily as needed (cough).    Yes [provider]  Multiple Vitamin (MULTIVITAMIN WITH MINERALS) TABS tablet Take 1 tablet by mouth daily.   Yes [provider]  oxyCODONE-acetaminophen (PERCOCET/ROXICET) 5-325 MG tablet Take 1 tablet by mouth every 8 (eight) hours as needed for severe pain. 02/21/18  Yes Curt Bears, MD     Family History  Problem Relation Age of  Onset  . Diabetes Mother     Social History   Socioeconomic History  . Marital status: Divorced    Spouse name: Not on file  . Number of children: Not on file  . Years of education: Not on file  . Highest education level: Not on file  Occupational History  . Not on file  Social Needs  . Financial resource strain: Not on file  . Food insecurity:    Worry: Not on file    Inability: Not on file  . Transportation needs:    Medical: Not on file    Non-medical:  Not on file  Tobacco Use  . Smoking status: Former Smoker    Packs/day: 1.00    Years: 50.00    Pack years: 50.00    Types: Cigarettes    Last attempt to quit: 08/24/2017    Years since quitting: 0.5  . Smokeless tobacco: Never Used  Substance and Sexual Activity  . Alcohol use: No  . Drug use: No  . Sexual activity: Never  Lifestyle  . Physical activity:    Days per week: Not on file    Minutes per session: Not on file  . Stress: Not on file  Relationships  . Social connections:    Talks on phone: Not on file    Gets together: Not on file    Attends religious service: Not on file    Active member of club or organization: Not on file    Attends meetings of clubs or organizations: Not on file    Relationship status: Not on file  Other Topics Concern  . Not on file  Social History Narrative  . Not on file    Review of Systems: A 12 point ROS discussed and pertinent positives are indicated in the HPI above.  All other systems are negative.  Review of Systems  Constitutional: Positive for activity change, appetite change and fatigue.  Respiratory: Negative for cough and shortness of breath.   Cardiovascular: Negative for chest pain.  Gastrointestinal: Negative for abdominal pain.  Musculoskeletal: Positive for back pain and gait problem.  Neurological: Positive for weakness.  Psychiatric/Behavioral: Negative for behavioral problems and confusion.    Vital Signs: BP (!) 145/59   Pulse 85   Temp 98.4 F (36.9 C) (Oral)   Resp 16   Ht 5' (1.524 m)   Wt 71 lb (32.2 kg)   SpO2 95%   BMI 13.87 kg/m   Physical Exam Vitals signs reviewed.  Cardiovascular:     Rate and Rhythm: Normal rate and regular rhythm.     Heart sounds: Normal heart sounds.  Pulmonary:     Breath sounds: Wheezing present.  Abdominal:     Palpations: Abdomen is soft.  Musculoskeletal: Normal range of motion.     Comments: Severely painful to palpate mid to low back Painful to move at all    Skin:    General: Skin is warm and dry.  Neurological:     Mental Status: She is alert and oriented to person, place, and time.  Psychiatric:        Mood and Affect: Mood normal.        Behavior: Behavior normal.        Thought Content: Thought content normal.        Judgment: Judgment normal.     Imaging: Mr Thoracic Spine W Wo Contrast  Result Date: 02/27/2018 CLINICAL DATA:  History of bending over to pick something up, now with  severe back pain. History of T9 vertebral augmentation for benign osteopenic compression fracture. Abnormal plain films show early compression at T10. EXAM: MRI THORACIC AND LUMBAR SPINE WITHOUT AND WITH CONTRAST TECHNIQUE: Multiplanar and multiecho pulse sequences of the thoracic and lumbar spine were obtained without and with intravenous contrast. CONTRAST:  Gadavist 3 mL COMPARISON:  CT chest 11/01/2017.  Chest radiograph 01/25/2018. FINDINGS: MRI THORACIC SPINE FINDINGS Alignment:  Physiologic. Vertebrae: Treated T9 compression fracture, status post kyphoplasty, mild residual bone marrow edema, preserved vertebral body height. New T10 compression fracture, loss of approximately 25% vertebral body height, superior endplate depression, diffuse bone marrow edema, no definite osseous destruction, suspected to represent adjacent segment posttraumatic benign osteopenic phenomenon. Mild postcontrast enhancement of T10, slight postcontrast enhancement of T9. No significant retropulsion. No findings concerning for epidural tumor or epidural hematoma. Cord:  Normal signal and morphology. Paraspinal and other soft tissues: LEFT pleural effusion/pleural thickening. COPD. Disc levels: No disc protrusion or epidural tumor. MRI LUMBAR SPINE FINDINGS Segmentation:  Standard. Alignment:  Anatomic. Vertebrae: No worrisome osseous lesion. No postcontrast enhancement. Conus medullaris: Extends to the L1-L2 level and appears normal. Paraspinal and other soft tissues: Unremarkable. Disc  levels: No compressive disc protrusion or spinal stenosis. IMPRESSION: Status post T9 kyphoplasty, for benign osteopenic compression fracture, with preserved vertebral body height. New T10 compression fracture, slightly progressed from plain films, slight anterior wedging, consistent with an adjacent segment injury. Vertebral augmentation at T10 may be warranted. Although there are no worrisome imaging features, tissue sampling could be performed as clinically indicated. No features worrisome for epidural tumor or epidural hematoma. Normal cord signal and morphology throughout. Unremarkable lumbar spine MRI. No compression deformities. No features worrisome for metastatic disease to the lumbosacral spine. Electronically Signed   By: Staci Righter M.D.   On: 02/27/2018 11:25   Mr Lumbar Spine W Wo Contrast  Result Date: 02/27/2018 CLINICAL DATA:  History of bending over to pick something up, now with severe back pain. History of T9 vertebral augmentation for benign osteopenic compression fracture. Abnormal plain films show early compression at T10. EXAM: MRI THORACIC AND LUMBAR SPINE WITHOUT AND WITH CONTRAST TECHNIQUE: Multiplanar and multiecho pulse sequences of the thoracic and lumbar spine were obtained without and with intravenous contrast. CONTRAST:  Gadavist 3 mL COMPARISON:  CT chest 11/01/2017.  Chest radiograph 01/25/2018. FINDINGS: MRI THORACIC SPINE FINDINGS Alignment:  Physiologic. Vertebrae: Treated T9 compression fracture, status post kyphoplasty, mild residual bone marrow edema, preserved vertebral body height. New T10 compression fracture, loss of approximately 25% vertebral body height, superior endplate depression, diffuse bone marrow edema, no definite osseous destruction, suspected to represent adjacent segment posttraumatic benign osteopenic phenomenon. Mild postcontrast enhancement of T10, slight postcontrast enhancement of T9. No significant retropulsion. No findings concerning for  epidural tumor or epidural hematoma. Cord:  Normal signal and morphology. Paraspinal and other soft tissues: LEFT pleural effusion/pleural thickening. COPD. Disc levels: No disc protrusion or epidural tumor. MRI LUMBAR SPINE FINDINGS Segmentation:  Standard. Alignment:  Anatomic. Vertebrae: No worrisome osseous lesion. No postcontrast enhancement. Conus medullaris: Extends to the L1-L2 level and appears normal. Paraspinal and other soft tissues: Unremarkable. Disc levels: No compressive disc protrusion or spinal stenosis. IMPRESSION: Status post T9 kyphoplasty, for benign osteopenic compression fracture, with preserved vertebral body height. New T10 compression fracture, slightly progressed from plain films, slight anterior wedging, consistent with an adjacent segment injury. Vertebral augmentation at T10 may be warranted. Although there are no worrisome imaging features, tissue sampling could be performed as clinically  indicated. No features worrisome for epidural tumor or epidural hematoma. Normal cord signal and morphology throughout. Unremarkable lumbar spine MRI. No compression deformities. No features worrisome for metastatic disease to the lumbosacral spine. Electronically Signed   By: Staci Righter M.D.   On: 02/27/2018 11:25   Ir Radiologist Eval & Mgmt  Result Date: 02/26/2018 Please refer to notes tab for details about interventional procedure. (Op Note)   Labs:  CBC: Recent Labs    12/27/17 1405 01/10/18 1115 01/25/18 1126 02/21/18 1127  WBC 6.5 6.6 7.0 5.3  HGB 12.1 10.8* 11.7* 12.0  HCT 39.5 35.4* 38.0 39.1  PLT 346 380 377 288    COAGS: Recent Labs    08/31/17 1142 11/27/17 1231  INR 1.04 0.85  APTT 32  --     BMP: Recent Labs    12/27/17 1405 01/10/18 1115 01/25/18 1126 02/21/18 1127  NA 143 142 141 140  K 3.8 3.6 3.5 3.7  CL 104 105 105 102  CO2 28 29 27 29   GLUCOSE 89 95 95 87  BUN 9 10 10 13   CALCIUM 9.5 8.8* 9.0 9.4  CREATININE 0.70 0.70 0.68 0.75    GFRNONAA >60 >60 >60 >60  GFRAA >60 >60 >60 >60    LIVER FUNCTION TESTS: Recent Labs    12/27/17 1405 01/10/18 1115 01/25/18 1126 02/21/18 1127  BILITOT 0.4 0.4 0.3 0.3  AST 20 15 13* 18  ALT 12 9 7 7   ALKPHOS 130* 101 109 110  PROT 7.7 6.8 7.1 7.2  ALBUMIN 3.5 3.0* 3.3* 3.5    TUMOR MARKERS: No results for input(s): AFPTM, CEA, CA199, CHROMGRNA in the last 8760 hours.  Assessment and Plan:  Previous T9 KP 11/2017-- instant pain relief Now with new pain and new fx T10 Scheduled for kyphoplasty in IR Risks and benefits of Thoracic 10 were discussed with the patient including, but not limited to education regarding the natural healing process of compression fractures without intervention, bleeding, infection, cement migration which may cause spinal cord damage, paralysis, pulmonary embolism or even death.  This interventional procedure involves the use of X-rays and because of the nature of the planned procedure, it is possible that we will have prolonged use of X-ray fluoroscopy.  Potential radiation risks to you include (but are not limited to) the following: - A slightly elevated risk for cancer  several years later in life. This risk is typically less than 0.5% percent. This risk is low in comparison to the normal incidence of human cancer, which is 33% for women and 50% for men according to the [place]. - Radiation induced injury can include skin redness, resembling a rash, tissue breakdown / ulcers and hair loss (which can be temporary or permanent).   The likelihood of either of these occurring depends on the difficulty of the procedure and whether you are sensitive to radiation due to previous procedures, disease, or genetic conditions.   IF your procedure requires a prolonged use of radiation, you will be notified and given written instructions for further action.  It is your responsibility to monitor the irradiated area for the 2 weeks following the  procedure and to notify your physician if you are concerned that you have suffered a radiation induced injury.    All of the patient's questions were answered, patient is agreeable to proceed.  Consent signed and in chart.  Thank you for this interesting consult.  I greatly enjoyed meeting Sherry [name] and look forward to  participating in their care.  A copy of this report was sent to the requesting provider on this date.  Electronically Signed: Lavonia Drafts, PA-C 03/01/2018, 10:35 AM   I spent a total of  30 Minutes   in face to face in clinical consultation, greater than 50% of which was counseling/coordinating care for T10 KP

## 2018-03-01 NOTE — Procedures (Signed)
  Procedure: Thoracic T10 KP and core biopsy   EBL:   minimal Complications:  none immediate  See full dictation in [facility].  Dillard Cannon MD Main # [phone] Pager  [phone]

## 2018-03-01 NOTE — Progress Notes (Signed)
Dr Vernard Gambles returned call new orders noted

## 2018-03-01 NOTE — Discharge Instructions (Signed)
Balloon Kyphoplasty, Care After Refer to this sheet in the next few weeks. These instructions provide you with information about caring for yourself after your procedure. Your health care provider may also give you more specific instructions. Your treatment has been planned according to current medical practices, but problems sometimes occur. Call your health care provider if you have any problems or questions after your procedure. What can I expect after the procedure? After your procedure, it is common to have back pain. Follow these instructions at home: Incision care  Follow instructions from your health care provider about how to take care of your incisions. Make sure you: ? Wash your hands with soap and water before you change your bandage (dressing). If soap and water are not available, use hand sanitizer. ? Change your dressing as told by your health care provider. ? Leave stitches (sutures), skin glue, or adhesive strips in place. These skin closures may need to be in place for 2 weeks or longer. If adhesive strip edges start to loosen and curl up, you may trim the loose edges. Do not remove adhesive strips completely unless your health care provider tells you to do that.  Check your incision area every day for signs of infection. Watch for: ? Redness, swelling, or pain. ? Fluid, blood, or pus.  Keep your dressing dry until your health care provider says that it can be removed. Activity   Rest your back and avoid intense physical activity for as long as told by your health care provider.  Return to your normal activities as told by your health care provider. Ask your health care provider what activities are safe for you.  Do not lift anything that is heavier than 10 lb (4.5 kg). This is about the weight of a gallon of milk.You may need to avoid heavy lifting for several weeks. General instructions  Take over-the-counter and prescription medicines only as told by your health care  provider.  If directed, apply ice to the painful area: ? Put ice in a plastic bag. ? Place a towel between your skin and the bag. ? Leave the ice on for 20 minutes, 2-3 times per day.  Do not use tobacco products, including cigarettes, chewing tobacco, or e-cigarettes. If you need help quitting, ask your health care provider.  Keep all follow-up visits as told by your health care provider. This is important. Contact a health care provider if:  You have a fever.  You have redness, swelling, or pain at the site of your incisions.  You have fluid, blood, or pus coming from your incisions.  You have pain that gets worse or does not get better with medicine.  You develop numbness or weakness in any part of your body. Get help right away if:  You have chest pain.  You have difficulty breathing.  You cannot move your legs.  You cannot control your bladder or bowel movements.  You suddenly become weak or numb on one side of your body.  You become very confused.  You have trouble speaking or understanding, or both. This information is not intended to replace advice given to you by your health care provider. Make sure you discuss any questions you have with your health care provider. Document Released: 09/16/2014 Document Revised: 06/03/2015 Document Reviewed: 04/20/2014 Elsevier Interactive Patient Education  [employer].

## 2018-03-01 NOTE — Progress Notes (Signed)
Pt states her pain is not better now but worse.  She is concerned that it hurts in a different spot.  Requesting to speak with Dr Vernard Gambles who has been notified and is coming to see pt

## 2018-03-05 ENCOUNTER — Other Ambulatory Visit: Payer: Self-pay | Admitting: Interventional Radiology

## 2018-03-05 DIAGNOSIS — S22000A Wedge compression fracture of unspecified thoracic vertebra, initial encounter for closed fracture: Secondary | ICD-10-CM

## 2018-03-06 ENCOUNTER — Inpatient Hospital Stay (HOSPITAL_BASED_OUTPATIENT_CLINIC_OR_DEPARTMENT_OTHER): Payer: Medicare Other | Admitting: Physician Assistant

## 2018-03-06 ENCOUNTER — Inpatient Hospital Stay: Payer: Medicare Other

## 2018-03-06 ENCOUNTER — Ambulatory Visit (HOSPITAL_COMMUNITY)
Admission: RE | Admit: 2018-03-06 | Discharge: 2018-03-06 | Disposition: A | Discharge status: Home / Self Care | Payer: Medicare Other | Source: Ambulatory Visit | Attending: Interventional Radiology | Admitting: Interventional Radiology

## 2018-03-06 ENCOUNTER — Telehealth: Payer: Self-pay | Admitting: Physician Assistant

## 2018-03-06 VITALS — BP 124/72 | HR 69 | Temp 98.5°F | Resp 18 | Ht 61.0 in | Wt 72.7 lb

## 2018-03-06 DIAGNOSIS — M545 Low back pain: Secondary | ICD-10-CM

## 2018-03-06 DIAGNOSIS — Z5112 Encounter for antineoplastic immunotherapy: Secondary | ICD-10-CM | POA: Diagnosis not present

## 2018-03-06 DIAGNOSIS — S22000A Wedge compression fracture of unspecified thoracic vertebra, initial encounter for closed fracture: Secondary | ICD-10-CM | POA: Diagnosis not present

## 2018-03-06 DIAGNOSIS — Z923 Personal history of irradiation: Secondary | ICD-10-CM | POA: Diagnosis not present

## 2018-03-06 DIAGNOSIS — M546 Pain in thoracic spine: Secondary | ICD-10-CM

## 2018-03-06 DIAGNOSIS — C3492 Malignant neoplasm of unspecified part of left bronchus or lung: Secondary | ICD-10-CM

## 2018-03-06 DIAGNOSIS — Z9221 Personal history of antineoplastic chemotherapy: Secondary | ICD-10-CM | POA: Diagnosis not present

## 2018-03-06 DIAGNOSIS — Z79899 Other long term (current) drug therapy: Secondary | ICD-10-CM

## 2018-03-06 DIAGNOSIS — K5900 Constipation, unspecified: Secondary | ICD-10-CM

## 2018-03-06 LAB — CBC WITH DIFFERENTIAL (CANCER CENTER ONLY)
Abs Immature Granulocytes: 0.01 10*3/uL (ref 0.00–0.07)
Basophils Absolute: 0.1 10*3/uL (ref 0.0–0.1)
Basophils Relative: 1 %
Eosinophils Absolute: 0.1 10*3/uL (ref 0.0–0.5)
Eosinophils Relative: 3 %
HCT: 37.7 % (ref 36.0–46.0)
Hemoglobin: 11.7 g/dL — ABNORMAL LOW (ref 12.0–15.0)
Immature Granulocytes: 0 %
Lymphocytes Relative: 18 %
Lymphs Abs: 0.9 10*3/uL (ref 0.7–4.0)
MCH: 27.9 pg (ref 26.0–34.0)
MCHC: 31 g/dL (ref 30.0–36.0)
MCV: 89.8 fL (ref 80.0–100.0)
Monocytes Absolute: 0.6 10*3/uL (ref 0.1–1.0)
Monocytes Relative: 11 %
NEUTROS PCT: 67 %
Neutro Abs: 3.6 10*3/uL (ref 1.7–7.7)
PLATELETS: 346 10*3/uL (ref 150–400)
RBC: 4.2 MIL/uL (ref 3.87–5.11)
RDW: 15 % (ref 11.5–15.5)
WBC Count: 5.3 10*3/uL (ref 4.0–10.5)
nRBC: 0 % (ref 0.0–0.2)

## 2018-03-06 LAB — CMP (CANCER CENTER ONLY)
ALBUMIN: 3.5 g/dL (ref 3.5–5.0)
ALT: 8 U/L (ref 0–44)
AST: 16 U/L (ref 15–41)
Alkaline Phosphatase: 115 U/L (ref 38–126)
Anion gap: 8 (ref 5–15)
BUN: 11 mg/dL (ref 8–23)
CO2: 29 mmol/L (ref 22–32)
Calcium: 9.4 mg/dL (ref 8.9–10.3)
Chloride: 102 mmol/L (ref 98–111)
Creatinine: 0.71 mg/dL (ref 0.44–1.00)
GFR, Est AFR Am: >60 mL/min (ref >60)
GFR, Estimated: >60 mL/min (ref >60)
Glucose, Bld: 89 mg/dL (ref 70–99)
Potassium: 3.6 mmol/L (ref 3.5–5.1)
Sodium: 139 mmol/L (ref 135–145)
TOTAL PROTEIN: 7.3 g/dL (ref 6.5–8.1)
Total Bilirubin: 0.4 mg/dL (ref 0.3–1.2)

## 2018-03-06 MED ORDER — SODIUM CHLORIDE 0.9 % IV SOLN
Freq: Once | INTRAVENOUS | Status: AC
  Administered 2018-03-06: 09:00:00 via INTRAVENOUS
  Filled 2018-03-06: qty 250

## 2018-03-06 MED ORDER — GABAPENTIN 100 MG PO CAPS: 100.0000 mg | ORAL_CAPSULE | Freq: Two times a day (BID) | ORAL | 0 refills | Status: DC

## 2018-03-06 MED ORDER — SODIUM CHLORIDE 0.9 % IV SOLN
10.5000 mg/kg | Freq: Once | INTRAVENOUS | Status: AC
  Administered 2018-03-06: 360 mg via INTRAVENOUS
  Filled 2018-03-06: qty 7.2

## 2018-03-06 NOTE — Telephone Encounter (Signed)
Added an addition al cycle per 2/26 los - pt to get an updated schedule next visit.

## 2018-03-06 NOTE — Progress Notes (Signed)
[facility] OFFICE PROGRESS NOTE  [name], NP [place] [address]  DIAGNOSIS: Stage IIb/IIIa (T3, N0/N1, M0)non-small cell lung cancer poorly differentiated squamous cell carcinoma diagnosed in August 2019. The patient presented with large superior segment leftlower lobe mass with questionable left hilar adenopathy.  PRIOR THERAPY: Concurrent chemoradiation with weekly carboplatin for AUC of 2 and paclitaxel 45 mg/M2.  Status post 7 cycles.  Last dose was given October 29, 2017 with partial response.  CURRENT THERAPY: Consolidation treatment with immunotherapy with Imfinzi 10 mg/KG every 2 weeks to start December 13, 2017.  Status post 5 cycles.  INTERVAL HISTORY: Sherry [name] 73 y.o. female returns to the clinic today for a follow up visit. The patient is feeling fair today without any concerning symptoms except for continued back pain secondary to her T10 compression fracture. She recently underwent a kyphoplasty last week. The patient still complains of significant back pain. She currently is managing her pain with Percocet 5-325 mg about twice a day. The pain medication alleviates her pain for approximately 20 minutes and makes her feel drowsy. She is scheduled for a thoracic spine CT today to evaluate the pain further. She is being closely followed by interventional radiology.   Regarding her consolidation immunotherapy, she is tolerating her Imfinzi fairly well without any adverse side effects. She denies fever, chills, night sweats, or recent weight loss. She recently met with the nutritionist and has gained 1-2lbs since that time. She denies cough, chest pain, hemoptysis, or shortness of breath. She denies headache or visual changes. She denies rashes or skin changes. She reports mild nausea secondary to her pain medication but denies nausea or vomiting. She also reports constipation. She states she has a bowel movement approximately every  4 days. She is currently not on a regular bowel regimen. She is here today for evaluation before starting cycle #6 today.   MEDICAL HISTORY: Past Medical History:  Diagnosis Date  . Allergy   . Anxiety   . Arthritis   . Cavitating mass in left lower lung lobe   . Complication of anesthesia    woke up during procedure  . COPD (chronic obstructive pulmonary disease) ([place])   . Depression   . Family history of adverse reaction to anesthesia    " my mother was always hard to wake up"  . Full dentures   . Headache   . Neuromuscular disorder ([place])   . Pneumonia   . Thoracic spine fracture ([facility]) 11/01/2017   T9  . Wears glasses     ALLERGIES:  is allergic to penicillins.  MEDICATIONS:  Current Outpatient Medications  Medication Sig Dispense Refill  . acetaminophen (TYLENOL 8 HOUR) 650 MG CR tablet Take 1 tablet (650 mg total) by mouth every 8 (eight) hours as needed for pain or fever. 30 tablet 0  . cyanocobalamin (,VITAMIN B-12,) 1000 MCG/ML injection Inject 1,000 mcg into the muscle See admin instructions. Administered by Dr. Eldridge Abrahams  - every other month (per pt)    . gabapentin (NEURONTIN) 100 MG capsule Take 1 capsule (100 mg total) by mouth 2 (two) times daily. Take 1 tablet once a day 30 capsule 0  . guaifenesin (ROBITUSSIN) 100 MG/5ML syrup Take 200 mg by mouth 3 (three) times daily as needed for cough.     . Menthol-Ascorbic Acid (LUDENS COUGH DROPS MT) Use as directed 1 lozenge in the mouth or throat 3 (three) times daily as needed (  cough).     . Multiple Vitamin (MULTIVITAMIN WITH MINERALS) TABS tablet Take 1 tablet by mouth daily.    Marland Kitchen oxyCODONE-acetaminophen (PERCOCET/ROXICET) 5-325 MG tablet Take 1 tablet by mouth every 8 (eight) hours as needed for severe pain. 20 tablet 0   No current facility-administered medications for this visit.    Facility-Administered Medications Ordered in Other Visits  Medication Dose Route Frequency Provider Last Rate Last Dose  .  durvalumab (IMFINZI) 360 mg in sodium chloride 0.9 % 100 mL chemo infusion  10.5 mg/kg (Treatment Plan Recorded) Intravenous Once Curt Bears, MD        SURGICAL HISTORY:  Past Surgical History:  Procedure Laterality Date  . ELBOW SURGERY     for  "Tennis Elbow"  . hand surgery    . IR KYPHO THORACIC WITH BONE BIOPSY  11/27/2017  . IR KYPHO THORACIC WITH BONE BIOPSY  03/01/2018  . IR RADIOLOGIST EVAL & MGMT  11/20/2017  . IR RADIOLOGIST EVAL & MGMT  02/26/2018  . MULTIPLE TOOTH EXTRACTIONS    . TUBAL LIGATION    . VIDEO BRONCHOSCOPY N/A 08/31/2017   Procedure: VIDEO BRONCHOSCOPY;  Surgeon: Melrose Nakayama, MD;  Location: [facility] OR;  Service: Thoracic;  Laterality: N/A;    REVIEW OF SYSTEMS:   Review of Systems  Constitutional: Negative for appetite change, chills, fatigue, fever and unexpected weight change.  HENT:   Negative for mouth sores, nosebleeds, sore throat and trouble swallowing.   Eyes: Negative for eye problems and icterus.  Respiratory: Negative for cough, hemoptysis, shortness of breath and wheezing.   Cardiovascular: Negative for chest pain and leg swelling.  Gastrointestinal: Positive for constipation and mild nausea. Negative for abdominal pain, diarrhea, and vomiting.  Genitourinary: Negative for bladder incontinence, difficulty urinating, dysuria, frequency and hematuria.   Musculoskeletal: Positive for thoracic back pain. Negative for gait problem, neck pain and neck stiffness.  Skin: Negative for itching and rash.  Neurological: Negative for dizziness, extremity weakness, gait problem, headaches, light-headedness and seizures.  Hematological: Negative for adenopathy. Does not bruise/bleed easily.  Psychiatric/Behavioral: Negative for confusion, depression and sleep disturbance. The patient is not nervous/anxious.     PHYSICAL EXAMINATION:  Blood pressure 124/72, pulse 69, temperature 98.5 F (36.9 C), temperature source Oral, resp. rate 18, height _0   (1.549 m), weight 72 lb 11.2 oz (33 kg), SpO2 92 %.  ECOG PERFORMANCE STATUS: 1 - Symptomatic but completely ambulatory  Physical Exam  Constitutional: Oriented to person, place, and time and thin appearing female, and in no distress. No distress.  HENT:  Head: Normocephalic and atraumatic.  Mouth/Throat: Oropharynx is clear and moist. No oropharyngeal exudate.  Eyes: Conjunctivae are normal. Right eye exhibits no discharge. Left eye exhibits no discharge. No scleral icterus.  Neck: Normal range of motion. Neck supple.  Cardiovascular: Normal rate, regular rhythm, normal heart sounds and intact distal pulses.   Pulmonary/Chest: Diffuse bilateral wheezing. Effort normal. No respiratory distress. No rales.  Abdominal: Soft. Bowel sounds are normal. Exhibits no distension and no mass. There is no tenderness.  Musculoskeletal: Tenderness to palpation over the thoracic spine. Normal range of motion. Exhibits no edema.  Lymphadenopathy:    No cervical adenopathy.  Neurological: Alert and oriented to person, place, and time. Exhibits normal muscle tone. Gait normal. Coordination normal.  Skin: Skin is warm and dry. No rash noted. Not diaphoretic. No erythema. No pallor.  Psychiatric: Mood, memory and judgment normal.  Vitals reviewed.  LABORATORY DATA: Lab Results  Component Value  Date   WBC 5.3 03/06/2018   HGB 11.7 (L) 03/06/2018   HCT 37.7 03/06/2018   MCV 89.8 03/06/2018   PLT 346 03/06/2018      Chemistry      Component Value Date/Time   NA 139 03/06/2018 0808   K 3.6 03/06/2018 0808   CL 102 03/06/2018 0808   CO2 29 03/06/2018 0808   BUN 11 03/06/2018 0808   CREATININE 0.71 03/06/2018 0808      Component Value Date/Time   CALCIUM 9.4 03/06/2018 0808   ALKPHOS 115 03/06/2018 0808   AST 16 03/06/2018 0808   ALT 8 03/06/2018 0808   BILITOT 0.4 03/06/2018 0175       RADIOGRAPHIC STUDIES:  Mr Thoracic Spine W Wo Contrast  Result Date: 02/27/2018 CLINICAL DATA:   History of bending over to pick something up, now with severe back pain. History of T9 vertebral augmentation for benign osteopenic compression fracture. Abnormal plain films show early compression at T10. EXAM: MRI THORACIC AND LUMBAR SPINE WITHOUT AND WITH CONTRAST TECHNIQUE: Multiplanar and multiecho pulse sequences of the thoracic and lumbar spine were obtained without and with intravenous contrast. CONTRAST:  Gadavist 3 mL COMPARISON:  CT chest 11/01/2017.  Chest radiograph 01/25/2018. FINDINGS: MRI THORACIC SPINE FINDINGS Alignment:  Physiologic. Vertebrae: Treated T9 compression fracture, status post kyphoplasty, mild residual bone marrow edema, preserved vertebral body height. New T10 compression fracture, loss of approximately 25% vertebral body height, superior endplate depression, diffuse bone marrow edema, no definite osseous destruction, suspected to represent adjacent segment posttraumatic benign osteopenic phenomenon. Mild postcontrast enhancement of T10, slight postcontrast enhancement of T9. No significant retropulsion. No findings concerning for epidural tumor or epidural hematoma. Cord:  Normal signal and morphology. Paraspinal and other soft tissues: LEFT pleural effusion/pleural thickening. COPD. Disc levels: No disc protrusion or epidural tumor. MRI LUMBAR SPINE FINDINGS Segmentation:  Standard. Alignment:  Anatomic. Vertebrae: No worrisome osseous lesion. No postcontrast enhancement. Conus medullaris: Extends to the L1-L2 level and appears normal. Paraspinal and other soft tissues: Unremarkable. Disc levels: No compressive disc protrusion or spinal stenosis. IMPRESSION: Status post T9 kyphoplasty, for benign osteopenic compression fracture, with preserved vertebral body height. New T10 compression fracture, slightly progressed from plain films, slight anterior wedging, consistent with an adjacent segment injury. Vertebral augmentation at T10 may be warranted. Although there are no worrisome  imaging features, tissue sampling could be performed as clinically indicated. No features worrisome for epidural tumor or epidural hematoma. Normal cord signal and morphology throughout. Unremarkable lumbar spine MRI. No compression deformities. No features worrisome for metastatic disease to the lumbosacral spine. Electronically Signed   By: Staci Righter M.D.   On: 02/27/2018 11:25   Mr Lumbar Spine W Wo Contrast  Result Date: 02/27/2018 CLINICAL DATA:  History of bending over to pick something up, now with severe back pain. History of T9 vertebral augmentation for benign osteopenic compression fracture. Abnormal plain films show early compression at T10. EXAM: MRI THORACIC AND LUMBAR SPINE WITHOUT AND WITH CONTRAST TECHNIQUE: Multiplanar and multiecho pulse sequences of the thoracic and lumbar spine were obtained without and with intravenous contrast. CONTRAST:  Gadavist 3 mL COMPARISON:  CT chest 11/01/2017.  Chest radiograph 01/25/2018. FINDINGS: MRI THORACIC SPINE FINDINGS Alignment:  Physiologic. Vertebrae: Treated T9 compression fracture, status post kyphoplasty, mild residual bone marrow edema, preserved vertebral body height. New T10 compression fracture, loss of approximately 25% vertebral body height, superior endplate depression, diffuse bone marrow edema, no definite osseous destruction, suspected to represent adjacent segment  posttraumatic benign osteopenic phenomenon. Mild postcontrast enhancement of T10, slight postcontrast enhancement of T9. No significant retropulsion. No findings concerning for epidural tumor or epidural hematoma. Cord:  Normal signal and morphology. Paraspinal and other soft tissues: LEFT pleural effusion/pleural thickening. COPD. Disc levels: No disc protrusion or epidural tumor. MRI LUMBAR SPINE FINDINGS Segmentation:  Standard. Alignment:  Anatomic. Vertebrae: No worrisome osseous lesion. No postcontrast enhancement. Conus medullaris: Extends to the L1-L2 level and  appears normal. Paraspinal and other soft tissues: Unremarkable. Disc levels: No compressive disc protrusion or spinal stenosis. IMPRESSION: Status post T9 kyphoplasty, for benign osteopenic compression fracture, with preserved vertebral body height. New T10 compression fracture, slightly progressed from plain films, slight anterior wedging, consistent with an adjacent segment injury. Vertebral augmentation at T10 may be warranted. Although there are no worrisome imaging features, tissue sampling could be performed as clinically indicated. No features worrisome for epidural tumor or epidural hematoma. Normal cord signal and morphology throughout. Unremarkable lumbar spine MRI. No compression deformities. No features worrisome for metastatic disease to the lumbosacral spine. Electronically Signed   By: Staci Righter M.D.   On: 02/27/2018 11:25   Ir Kypho Thoracic With Bone Biopsy  Result Date: 03/01/2018 CLINICAL DATA:  History of lung carcinoma stage III. Previous T9 painful vertebral compression fracture in November 2019, with good response to percutaneous kyphoplasty. She presented recently with new midthoracic pain. Imaging demonstrated a benign-appearing subacute T10 compression fracture. EXAM: KYPHOPLASTY AT THORACIC T10 DEEP BONE T10 VERTEBRAL BODY CORE BIOPSY TECHNIQUE: The procedure, risks (including but not limited to bleeding, infection, organ damage), benefits, and alternatives were explained to the patient. Questions regarding the procedure were encouraged and answered. The patient understands and consents to the procedure. The patient was placed prone on the fluoroscopic table. The skin overlying the lower thoracic region was then prepped and draped in the usual sterile fashion. Maximal barrier sterile technique was utilized including caps, mask, sterile gowns, sterile gloves, sterile drape, hand hygiene and skin antiseptic. Intravenous Fentanyl and Versed were administered as conscious sedation  during continuous cardiorespiratory monitoring by the radiology RN, with a total moderate sedation time of26 minutes. As antibiotic prophylaxis, Cipro 900 mg was ordered pre-procedure and administered intravenously within !one hour! of incision. The right pedicle at thoracic T10 was then infiltrated with 1% lidocaine followed by the advancement of a Kyphon trocar needle through the right pedicle into the posterior one-third. The trocar was removed and coaxial core bone biopsy was obtained. Subsequently the osteo drill was advanced to the anterior third of the vertebral body. The osteo drill was retracted. Through the working cannula, a Kyphon inflatable bone tamp 15 x 2 was advanced and positioned with the distal marker 5 mm from the anterior aspect of the cortex. Crossing of the midline was seen on the AP projection. At this time, the balloon was expanded using contrast via a Kyphon inflation syringe device via micro tubing. Good positioning was seen on AP and lateral imaging. Inflation continued until there was near apposition across the midline and towards the superior and the inferior end plates. At this time, methylmethacrylate mixture was reconstituted in the Kyphon bone mixing device system. This was then loaded into the delivery mechanism, attached to Kyphon cement delivery system The balloon was deflated and removed followed by the instillation of methylmethacrylate mixture with excellent filling in the AP and lateral projections. No extravasation was noted in the disk spaces or posteriorly into the spinal canal. No epidural venous contamination was seen. The patient  tolerated the procedure well. There were no acute complications. The working cannulae and the bone filler were then retrieved and removed. COMPLICATIONS: COMPLICATIONS None immediate. IMPRESSION: 1. Status post vertebral body augmentation using balloon kyphoplasty at thoracic T10 as described without event. 2. Deep core bone biopsy of T10  vertebral body. 3. Per CMS PQRS reporting requirements (PQRS Measure 24): Given the patient's age of greater than 43 and the fracture site (hip, distal radius, or spine), the patient should be tested for osteoporosis using DXA, and the appropriate treatment considered based on the DXA results. Electronically Signed   By: Lucrezia Europe M.D.   On: 03/01/2018 14:33   Ir Radiologist Eval & Mgmt  Result Date: 02/26/2018 Please refer to notes tab for details about interventional procedure. (Op Note)    ASSESSMENT/PLAN:  This is a very pleasant 73 year old Caucasian female with stage IIIa non-small cell lung cancer, poorly differentiated squamous cell carcinoma. She was diagnosed in August 2019. The patient underwent a course of concurrent chemoradiation with weekly carboplatin for an AUC of 2 and paclitaxel 45 mg/m2. She is status post 7 cycles with a partial response.  The patient is currently undergoing consolidation immunotherapy with Imfinzi. She is tolerating treatment well with no adverse effects. I recommend she proceed with cycle #6 today as scheduled.  The patient was seen with Dr. Julien Nordmann today. Labs were reviewed. I will arrange for a restaging CT chest prior to starting cycle #7 in 2 weeks. We will see her back at that time for evaluation and to review the results.  Regarding the back pain, advise the patient to proceed with her CT thoracic spine today to evaluate the pain further. Patient is being followed by IR.  For pain control, continue current pain regimen with Percocet. Additionally, I have prescribed 100 mg of gabapentin to be taken daily.  The patient was recently diagnosed with osteoporosis. She will follow up with her PCP regarding optimal osteoporosis management recommendations.  The patient reports constipation. I reviewed strategies to avoid constipation such as drinking plenty of fluid, eating fruits and vegetable, and being active. Recommend she start a regular bowel regimen with a  stool softener such as colace or senna to be taken twice a day to avoid constipation with the goal of having at least one bowel movement every other day. She is aware that these medications can be purchased over the counter.  The patient was advised to call immediately if she has any concerning symptoms in the interval. The patient voices understanding of current disease status and treatment options and is in agreement with the current care plan. All questions were answered. The patient knows to call the clinic with any problems, questions or concerns. We can certainly see the patient much sooner if necessary  Orders Placed This Encounter  Procedures  . CT Chest W Contrast    Standing Status:   Future    Standing Expiration Date:   03/06/2019    Order Specific Question:   ** REASON FOR EXAM (FREE TEXT)    Answer:   Restaging Lung Cancer    Order Specific Question:   If indicated for the ordered procedure, I authorize the administration of contrast media per Radiology protocol    Answer:   Yes    Order Specific Question:   Preferred imaging location?    Answer:   [facility]    Order Specific Question:   Radiology Contrast Protocol - do NOT remove file path  Answer:   [email or web address]      L , PA-C 03/06/18  ADDENDUM: Hematology/Oncology Attending: I had a face-to-face encounter with the patient.  I recommended her care plan.  This is a very pleasant 73 years old white female with history of stage IIIa non-small cell lung cancer poorly differentiated squamous cell carcinoma status post a course of concurrent chemoradiation with weekly carboplatin and paclitaxel and she is currently undergoing consolidation treatment with immunotherapy with Imfinzi status post 5 cycles. She is tolerating the treatment well with no concerning adverse effects. She had compression fracture seen on the previous imaging studies and the patient was  evaluated by interventional radiology and she underwent vertebroplasty.  She still complaining of pain in that area as well as the lower back.  I recommended for the patient to continue with her current pain medication with Percocet.  We also started her on gabapentin 100 mg p.o. twice daily. The patient will come back for follow-up visit in 2 weeks for evaluation with repeat CT scan of the chest for restaging of her disease. She was advised to call immediately if she has any concerning symptoms in the interval.  Disclaimer: This note was dictated with voice recognition software. Similar sounding words can inadvertently be transcribed and may be missed upon review. Eilleen Kempf, MD 03/07/18

## 2018-03-06 NOTE — Patient Instructions (Signed)
[facility] Discharge Instructions for Patients Receiving Chemotherapy  Today you received the following chemotherapy agents: Imfinzi.  To help prevent nausea and vomiting after your treatment, we encourage you to take your nausea medication as directed.   If you develop nausea and vomiting that is not controlled by your nausea medication, call the clinic.   BELOW ARE SYMPTOMS THAT SHOULD BE REPORTED IMMEDIATELY:  *FEVER GREATER THAN 100.5 F  *CHILLS WITH OR WITHOUT FEVER  NAUSEA AND VOMITING THAT IS NOT CONTROLLED WITH YOUR NAUSEA MEDICATION  *UNUSUAL SHORTNESS OF BREATH  *UNUSUAL BRUISING OR BLEEDING  TENDERNESS IN MOUTH AND THROAT WITH OR WITHOUT PRESENCE OF ULCERS  *URINARY PROBLEMS  *BOWEL PROBLEMS  UNUSUAL RASH Items with * indicate a potential emergency and should be followed up as soon as possible.  Feel free to call the clinic should you have any questions or concerns. The clinic phone number is [phone].  Please show the CHEMO ALERT CARD at check-in to the Emergency Department and triage nurse.   

## 2018-03-06 NOTE — Patient Instructions (Addendum)
Senna or colace for constipation can purchase it over the counter CT chest before next visit in 2 weeks.  Gabapentin for pain take ONCE a day    Osteoporosis  Osteoporosis happens when your bones get thin and weak. This can cause your bones to break (fracture) more easily. You can do things at home to make your bones stronger. Follow these instructions at home:  Activity  Exercise as told by your doctor. Ask your doctor what activities are safe for you. You should do: ? Exercises that make your muscles work to hold your body weight up (weight-bearing exercises). These include tai chi, yoga, and walking. ? Exercises to make your muscles stronger. One example is lifting weights. Lifestyle  Limit alcohol intake to no more than 1 drink a day for nonpregnant women and 2 drinks a day for men. One drink equals 12 oz of beer, 5 oz of wine, or 1 oz of hard liquor.  Do not use any products that have nicotine or tobacco in them. These include cigarettes and e-cigarettes. If you need help quitting, ask your doctor. Preventing falls  Use tools to help you move around (mobility aids) as needed. These include canes, walkers, scooters, and crutches.  Keep rooms well-lit and free of clutter.  Put away things that could make you trip. These include cords and rugs.  Install safety rails on stairs. Install grab bars in bathrooms.  Use rubber mats in slippery areas, like bathrooms.  Wear shoes that: ? Fit you well. ? Support your feet. ? Have closed toes. ? Have rubber soles or low heels.  Tell your doctor about all of the medicines you are taking. Some medicines can make you more likely to fall. General instructions  Eat plenty of calcium and vitamin D. These nutrients are good for your bones. Good sources of calcium and vitamin D include: ? Some fatty fish, such as salmon and tuna. ? Foods that have calcium and vitamin D added to them (fortified foods). For example, some breakfast cereals  are fortified with calcium and vitamin D. ? Egg yolks. ? Cheese. ? Liver.  Take over-the-counter and prescription medicines only as told by your doctor.  Keep all follow-up visits as told by your doctor. This is important. Contact a doctor if:  You have not been tested (screened) for osteoporosis and you are: ? A woman who is age 57 or older. ? A man who is age 94 or older. Get help right away if:  You fall.  You get hurt. Summary  Osteoporosis happens when your bones get thin and weak.  Weak bones can break (fracture) more easily.  Eat plenty of calcium and vitamin D. These nutrients are good for your bones.  Tell your doctor about all of the medicines that you take. This information is not intended to replace advice given to you by your health care provider. Make sure you discuss any questions you have with your health care provider. Document Released: 03/20/2011 Document Revised: 10/20/2016 Document Reviewed: 10/20/2016 Elsevier Interactive Patient Education  2019 [employer].  Constipation, Adult Constipation is when a person:  Poops (has a bowel movement) fewer times in a week than normal.  Has a hard time pooping.  Has poop that is dry, hard, or bigger than normal. Follow these instructions at home: Eating and drinking   Eat foods that have a lot of fiber, such as: ? Fresh fruits and vegetables. ? Whole grains. ? Beans.  Eat less of foods that are  high in fat, low in fiber, or overly processed, such as: ? [place] fries. ? Hamburgers. ? Cookies. ? Candy. ? Soda.  Drink enough fluid to keep your pee (urine) clear or pale yellow. General instructions  Exercise regularly or as told by your doctor.  Go to the restroom when you feel like you need to poop. Do not hold it in.  Take over-the-counter and prescription medicines only as told by your doctor. These include any fiber supplements.  Do pelvic floor retraining exercises, such as: ? Doing deep  breathing while relaxing your lower belly (abdomen). ? Relaxing your pelvic floor while pooping.  Watch your condition for any changes.  Keep all follow-up visits as told by your doctor. This is important. Contact a doctor if:  You have pain that gets worse.  You have a fever.  You have not pooped for 4 days.  You throw up (vomit).  You are not hungry.  You lose weight.  You are bleeding from the anus.  You have thin, pencil-like poop (stool). Get help right away if:  You have a fever, and your symptoms suddenly get worse.  You leak poop or have blood in your poop.  Your belly feels hard or bigger than normal (is bloated).  You have very bad belly pain.  You feel dizzy or you faint. This information is not intended to replace advice given to you by your health care provider. Make sure you discuss any questions you have with your health care provider. Document Released: 06/14/2007 Document Revised: 07/16/2015 Document Reviewed: 06/16/2015 Elsevier Interactive Patient Education  2019 [employer].

## 2018-03-07 ENCOUNTER — Other Ambulatory Visit: Payer: Self-pay | Admitting: Interventional Radiology

## 2018-03-07 ENCOUNTER — Encounter: Payer: Self-pay | Admitting: Physician Assistant

## 2018-03-07 DIAGNOSIS — S22000A Wedge compression fracture of unspecified thoracic vertebra, initial encounter for closed fracture: Secondary | ICD-10-CM

## 2018-03-13 ENCOUNTER — Other Ambulatory Visit: Payer: Self-pay | Admitting: Interventional Radiology

## 2018-03-13 ENCOUNTER — Encounter: Payer: Self-pay | Admitting: Radiology

## 2018-03-13 ENCOUNTER — Ambulatory Visit
Admission: RE | Admit: 2018-03-13 | Discharge: 2018-03-13 | Disposition: A | Discharge status: Home / Self Care | Payer: Medicare Other | Source: Ambulatory Visit | Attending: Interventional Radiology | Admitting: Interventional Radiology

## 2018-03-13 DIAGNOSIS — S22080G Wedge compression fracture of T11-T12 vertebra, subsequent encounter for fracture with delayed healing: Secondary | ICD-10-CM

## 2018-03-13 DIAGNOSIS — S22000A Wedge compression fracture of unspecified thoracic vertebra, initial encounter for closed fracture: Secondary | ICD-10-CM

## 2018-03-13 HISTORY — PX: IR RADIOLOGIST EVAL & MGMT: IMG5224

## 2018-03-13 NOTE — Progress Notes (Signed)
Referring Physician(s): Dr Eldridge Abrahams  Chief Complaint: The patient is seen in follow up today s/p Thoracic 10 KP 2/21.20; previous T9 KP 11/2017  History of present illness:  Pain never fully was relieved after most recent Thoracic 10 KP 03/01/18 Apt states new pain was evident even day of procedure Taking Oxycodone and gabapentin daily Both make her feel "funny" But do help pain even if for only few hrs.  CT 2/26:  ADDENDUM: I reviewed this study with Dr. Vernard Gambles. In retrospect there is mild convexity of the superior endplate of Y40 when compared to prior thoracic spine MRI of 02/26/2018 and thoracic spine CT scan from 11/01/2017. This would suggest a new/acute superior endplate fracture.  Pt here today for follow up post T10 KP and evaluation/management of new T11 fracture  Past Medical History:  Diagnosis Date  . Allergy   . Anxiety   . Arthritis   . Cavitating mass in left lower lung lobe   . Complication of anesthesia    woke up during procedure  . COPD (chronic obstructive pulmonary disease) ([place])   . Depression   . Family history of adverse reaction to anesthesia    " my mother was always hard to wake up"  . Full dentures   . Headache   . Neuromuscular disorder ([place])   . Pneumonia   . Thoracic spine fracture ([place]) 11/01/2017   T9  . Wears glasses     Past Surgical History:  Procedure Laterality Date  . ELBOW SURGERY     for  "Tennis Elbow"  . hand surgery    . IR KYPHO THORACIC WITH BONE BIOPSY  11/27/2017  . IR KYPHO THORACIC WITH BONE BIOPSY  03/01/2018  . IR RADIOLOGIST EVAL & MGMT  11/20/2017  . IR RADIOLOGIST EVAL & MGMT  02/26/2018  . MULTIPLE TOOTH EXTRACTIONS    . TUBAL LIGATION    . VIDEO BRONCHOSCOPY N/A 08/31/2017   Procedure: VIDEO BRONCHOSCOPY;  Surgeon: Melrose Nakayama, MD;  Location: [facility] OR;  Service: Thoracic;  Laterality: N/A;    Allergies: Penicillins  Medications: Prior to Admission medications   Medication Sig Start  Date End Date Taking? Authorizing Provider  acetaminophen (TYLENOL 8 HOUR) 650 MG CR tablet Take 1 tablet (650 mg total) by mouth every 8 (eight) hours as needed for pain or fever. 10/30/17   Varney Biles, MD  cyanocobalamin (,VITAMIN B-12,) 1000 MCG/ML injection Inject 1,000 mcg into the muscle See admin instructions. Administered by Dr. Eldridge Abrahams  - every other month (per pt)    [provider]  gabapentin (NEURONTIN) 100 MG capsule Take 1 capsule (100 mg total) by mouth 2 (two) times daily. Take 1 tablet once a day 03/06/18   Heilingoetter, Cassandra L, PA-C  guaifenesin (ROBITUSSIN) 100 MG/5ML syrup Take 200 mg by mouth 3 (three) times daily as needed for cough.     [provider]  Menthol-Ascorbic Acid (LUDENS COUGH DROPS MT) Use as directed 1 lozenge in the mouth or throat 3 (three) times daily as needed (cough).     [provider]  Multiple Vitamin (MULTIVITAMIN WITH MINERALS) TABS tablet Take 1 tablet by mouth daily.    [provider]  oxyCODONE-acetaminophen (PERCOCET/ROXICET) 5-325 MG tablet Take 1 tablet by mouth every 8 (eight) hours as needed for severe pain. 02/21/18   Curt Bears, MD     Family History  Problem Relation Age of Onset  . Diabetes Mother     Social History  Socioeconomic History  . Marital status: Divorced    Spouse name: Not on file  . Number of children: Not on file  . Years of education: Not on file  . Highest education level: Not on file  Occupational History  . Not on file  Social Needs  . Financial resource strain: Not on file  . Food insecurity:    Worry: Not on file    Inability: Not on file  . Transportation needs:    Medical: Not on file    Non-medical: Not on file  Tobacco Use  . Smoking status: Former Smoker    Packs/day: 1.00    Years: 50.00    Pack years: 50.00    Types: Cigarettes    Last attempt to quit: 08/24/2017    Years since quitting: 0.5  . Smokeless tobacco: Never Used    Substance and Sexual Activity  . Alcohol use: No  . Drug use: No  . Sexual activity: Never  Lifestyle  . Physical activity:    Days per week: Not on file    Minutes per session: Not on file  . Stress: Not on file  Relationships  . Social connections:    Talks on phone: Not on file    Gets together: Not on file    Attends religious service: Not on file    Active member of club or organization: Not on file    Attends meetings of clubs or organizations: Not on file    Relationship status: Not on file  Other Topics Concern  . Not on file  Social History Narrative  . Not on file     Vital Signs: BP (!) 111/58   Pulse 73   Temp 98.2 F (36.8 C) (Oral)   Resp 15   Ht 5' (1.524 m)   Wt 72 lb (32.7 kg)   SpO2 94%   BMI 14.06 kg/m   Physical Exam Vitals signs reviewed.  Skin:    General: Skin is warm and dry.     Comments: Site of T10 KP is clean and dry No bleeding No hematoma + Pain to palpate in same area and lower     Imaging: No results found.  Labs:  CBC: Recent Labs    01/25/18 1126 02/21/18 1127 03/01/18 1029 03/06/18 0808  WBC 7.0 5.3 5.3 5.3  HGB 11.7* 12.0 12.1 11.7*  HCT 38.0 39.1 39.7 37.7  PLT 377 288 333 346    COAGS: Recent Labs    08/31/17 1142 11/27/17 1231 03/01/18 1029  INR 1.04 0.85 0.94  APTT 32  --   --     BMP: Recent Labs    01/25/18 1126 02/21/18 1127 03/01/18 1029 03/06/18 0808  NA 141 140 140 139  K 3.5 3.7 3.4* 3.6  CL 105 102 104 102  CO2 27 29 29 29   GLUCOSE 95 87 89 89  BUN 10 13 8 11   CALCIUM 9.0 9.4 9.3 9.4  CREATININE 0.68 0.75 0.71 0.71  GFRNONAA >60 >60 >60 >60  GFRAA >60 >60 >60 >60    LIVER FUNCTION TESTS: Recent Labs    01/10/18 1115 01/25/18 1126 02/21/18 1127 03/06/18 0808  BILITOT 0.4 0.3 0.3 0.4  AST 15 13* 18 16  ALT 9 7 7 8   ALKPHOS 101 109 110 115  PROT 6.8 7.1 7.2 7.3  ALBUMIN 3.0* 3.3* 3.5 3.5    Assessment:  Previous kyphoplasty Thoracic 9 11/2017-- with great post  procedure pain relief Recent 2/21 T10  KP - no pain relief 2/26 CT reveals new T11 fracture-- probably responsible for continued back pain Dr Vernard Gambles reviews imaging with pt and reports to her the new T11 fracture Plan: schedule T11 KP asap-- hope to be later in day if can per pt Pt is agreeable  Signed: Lavonia Drafts, PA-C 03/13/2018, 3:08 PM   Please refer to Dr. Vernard Gambles attestation of this note for management and plan.

## 2018-03-18 ENCOUNTER — Telehealth: Payer: Self-pay | Admitting: Medical Oncology

## 2018-03-18 ENCOUNTER — Telehealth (HOSPITAL_COMMUNITY): Payer: Self-pay

## 2018-03-18 NOTE — Telephone Encounter (Signed)
Called to schedule kp, no answer, left vm. AW 

## 2018-03-18 NOTE — Telephone Encounter (Signed)
Pain management- Gabapentin does not take pain waway, " I need something to take my pain away until I have procedure next week" . She wants oxycodone. I told her to contact PCP for stronger pain med and I will notify [place] and [place].

## 2018-03-19 ENCOUNTER — Ambulatory Visit (HOSPITAL_COMMUNITY): Payer: Medicare Other

## 2018-03-19 ENCOUNTER — Ambulatory Visit (HOSPITAL_COMMUNITY)
Admission: RE | Admit: 2018-03-19 | Discharge: 2018-03-19 | Disposition: A | Discharge status: Home / Self Care | Payer: Medicare Other | Source: Ambulatory Visit | Attending: Physician Assistant | Admitting: Physician Assistant

## 2018-03-19 DIAGNOSIS — C3492 Malignant neoplasm of unspecified part of left bronchus or lung: Secondary | ICD-10-CM | POA: Insufficient documentation

## 2018-03-19 MED ORDER — SODIUM CHLORIDE (PF) 0.9 % IJ SOLN
INTRAMUSCULAR | Status: AC
  Filled 2018-03-19: qty 50

## 2018-03-19 MED ORDER — IOHEXOL 300 MG/ML  SOLN
75.0000 mL | Freq: Once | INTRAMUSCULAR | Status: AC | PRN
  Administered 2018-03-19: 75 mL via INTRAVENOUS

## 2018-03-21 ENCOUNTER — Other Ambulatory Visit: Payer: Self-pay

## 2018-03-21 ENCOUNTER — Inpatient Hospital Stay: Payer: Medicare Other | Attending: Nurse Practitioner

## 2018-03-21 ENCOUNTER — Inpatient Hospital Stay (HOSPITAL_BASED_OUTPATIENT_CLINIC_OR_DEPARTMENT_OTHER): Payer: Medicare Other | Admitting: Physician Assistant

## 2018-03-21 ENCOUNTER — Inpatient Hospital Stay: Payer: Medicare Other

## 2018-03-21 VITALS — BP 121/60 | HR 70 | Temp 98.3°F | Resp 17 | Ht 60.0 in | Wt 74.8 lb

## 2018-03-21 DIAGNOSIS — Z5112 Encounter for antineoplastic immunotherapy: Secondary | ICD-10-CM

## 2018-03-21 DIAGNOSIS — C3432 Malignant neoplasm of lower lobe, left bronchus or lung: Secondary | ICD-10-CM | POA: Diagnosis not present

## 2018-03-21 DIAGNOSIS — M549 Dorsalgia, unspecified: Secondary | ICD-10-CM | POA: Insufficient documentation

## 2018-03-21 DIAGNOSIS — Z79899 Other long term (current) drug therapy: Secondary | ICD-10-CM | POA: Diagnosis not present

## 2018-03-21 DIAGNOSIS — Z923 Personal history of irradiation: Secondary | ICD-10-CM | POA: Insufficient documentation

## 2018-03-21 DIAGNOSIS — C3492 Malignant neoplasm of unspecified part of left bronchus or lung: Secondary | ICD-10-CM

## 2018-03-21 DIAGNOSIS — J449 Chronic obstructive pulmonary disease, unspecified: Secondary | ICD-10-CM | POA: Insufficient documentation

## 2018-03-21 LAB — CMP (CANCER CENTER ONLY)
ALT: 7 U/L (ref 0–44)
AST: 16 U/L (ref 15–41)
Albumin: 3.3 g/dL — ABNORMAL LOW (ref 3.5–5.0)
Alkaline Phosphatase: 110 U/L (ref 38–126)
Anion gap: 10 (ref 5–15)
BUN: 10 mg/dL (ref 8–23)
CO2: 24 mmol/L (ref 22–32)
Calcium: 8.9 mg/dL (ref 8.9–10.3)
Chloride: 107 mmol/L (ref 98–111)
Creatinine: 0.72 mg/dL (ref 0.44–1.00)
GFR, Est AFR Am: >60 mL/min (ref >60)
GFR, Estimated: >60 mL/min (ref >60)
Glucose, Bld: 85 mg/dL (ref 70–99)
Potassium: 3.9 mmol/L (ref 3.5–5.1)
SODIUM: 141 mmol/L (ref 135–145)
Total Bilirubin: 0.4 mg/dL (ref 0.3–1.2)
Total Protein: 6.9 g/dL (ref 6.5–8.1)

## 2018-03-21 LAB — CBC WITH DIFFERENTIAL/PLATELET
Abs Immature Granulocytes: 0.01 10*3/uL (ref 0.00–0.07)
Basophils Absolute: 0.1 10*3/uL (ref 0.0–0.1)
Basophils Relative: 1 %
EOS PCT: 3 %
Eosinophils Absolute: 0.1 10*3/uL (ref 0.0–0.5)
HCT: 38.8 % (ref 36.0–46.0)
Hemoglobin: 11.7 g/dL — ABNORMAL LOW (ref 12.0–15.0)
Immature Granulocytes: 0 %
LYMPHS PCT: 22 %
Lymphs Abs: 1 10*3/uL (ref 0.7–4.0)
MCH: 27.8 pg (ref 26.0–34.0)
MCHC: 30.2 g/dL (ref 30.0–36.0)
MCV: 92.2 fL (ref 80.0–100.0)
Monocytes Absolute: 0.6 10*3/uL (ref 0.1–1.0)
Monocytes Relative: 13 %
Neutro Abs: 2.8 10*3/uL (ref 1.7–7.7)
Neutrophils Relative %: 61 %
Platelets: 320 10*3/uL (ref 150–400)
RBC: 4.21 MIL/uL (ref 3.87–5.11)
RDW: 16.2 % — ABNORMAL HIGH (ref 11.5–15.5)
WBC: 4.7 10*3/uL (ref 4.0–10.5)
nRBC: 0 % (ref 0.0–0.2)

## 2018-03-21 LAB — TSH: TSH: 2.025 u[IU]/mL (ref 0.308–3.960)

## 2018-03-21 MED ORDER — SODIUM CHLORIDE 0.9 % IV SOLN
10.5000 mg/kg | Freq: Once | INTRAVENOUS | Status: AC
  Administered 2018-03-21: 360 mg via INTRAVENOUS
  Filled 2018-03-21: qty 7.2

## 2018-03-21 MED ORDER — SODIUM CHLORIDE 0.9 % IV SOLN
Freq: Once | INTRAVENOUS | Status: AC
  Administered 2018-03-21: 13:00:00 via INTRAVENOUS
  Filled 2018-03-21: qty 250

## 2018-03-21 NOTE — Patient Instructions (Signed)
[facility] Discharge Instructions for Patients Receiving Chemotherapy  Today you received the following chemotherapy agents: Imfinzi.  To help prevent nausea and vomiting after your treatment, we encourage you to take your nausea medication as directed.   If you develop nausea and vomiting that is not controlled by your nausea medication, call the clinic.   BELOW ARE SYMPTOMS THAT SHOULD BE REPORTED IMMEDIATELY:  *FEVER GREATER THAN 100.5 F  *CHILLS WITH OR WITHOUT FEVER  NAUSEA AND VOMITING THAT IS NOT CONTROLLED WITH YOUR NAUSEA MEDICATION  *UNUSUAL SHORTNESS OF BREATH  *UNUSUAL BRUISING OR BLEEDING  TENDERNESS IN MOUTH AND THROAT WITH OR WITHOUT PRESENCE OF ULCERS  *URINARY PROBLEMS  *BOWEL PROBLEMS  UNUSUAL RASH Items with * indicate a potential emergency and should be followed up as soon as possible.  Feel free to call the clinic should you have any questions or concerns. The clinic phone number is [phone].  Please show the CHEMO ALERT CARD at check-in to the Emergency Department and triage nurse.   

## 2018-03-21 NOTE — Progress Notes (Signed)
[facility] OFFICE PROGRESS NOTE  [name], NP [place] [id number]  DIAGNOSIS: Stage IIb/IIIa (T3, N0/N1, M0)non-small cell lung cancer poorly differentiated squamous cell carcinoma diagnosed in August 2019. The patient presented with large superior segment leftlower lobe mass with questionable left hilar adenopathy.  PRIOR THERAPY: Concurrent chemoradiation with weekly carboplatin for AUC of 2 and paclitaxel 45 mg/M2. Status post 7 cycles. Last dose was given October 29, 2017 with partial response.  CURRENT THERAPY: Consolidation treatment with immunotherapy with Imfinzi 10 mg/KG every 2 weeks to start December 13, 2017. Status post 6cycles.  INTERVAL HISTORY: Sherry [name] 73 y.o. female returns to the clinic for a follow-up visit.  The patient is feeling fairly well today without any concerning complaints except for continued back pain secondary to a T11 fracture.  The patient recently had kyphoplasty performed for her T10 compression fracture; however she still continued to have pain after the procedure. Further evaluation revealed a T11 compression fracture.  She will get a repeat kyphoplasty on 03/26/2018.  Her pain is now being managed by her primary care doctor.  Additionally her primary care doctor is also managing her osteoporosis and she is currently on a bisphosphonate vitamin D and calcium.  Otherwise, the patient continues to tolerate her treatment with Imfinzi well without any adverse effects.  She denies any fever, chills, night sweats, or weight loss.  She denies any chest pain, shortness of breath, cough, or hemoptysis.  She denies any nausea, vomiting, diarrhea, or constipation.  She denies any headache or visual changes.  She denies any rashes or skin changes.  She recently had a restaging CT scan. She is here today for evaluation and discuss of her scan results prior to starting cycle #7.   MEDICAL HISTORY: Past Medical  History:  Diagnosis Date  . Allergy   . Anxiety   . Arthritis   . Cavitating mass in left lower lung lobe   . Complication of anesthesia    woke up during procedure  . COPD (chronic obstructive pulmonary disease) ([place])   . Depression   . Family history of adverse reaction to anesthesia    " my mother was always hard to wake up"  . Full dentures   . Headache   . Neuromuscular disorder ([place])   . Pneumonia   . Thoracic spine fracture ([place]) 11/01/2017   T9  . Wears glasses     ALLERGIES:  is allergic to penicillins.  MEDICATIONS:  Current Outpatient Medications  Medication Sig Dispense Refill  . acetaminophen (TYLENOL 8 HOUR) 650 MG CR tablet Take 1 tablet (650 mg total) by mouth every 8 (eight) hours as needed for pain or fever. 30 tablet 0  . alendronate (FOSAMAX) 70 MG tablet Take 70 mg by mouth once a week.    . cyanocobalamin (,VITAMIN B-12,) 1000 MCG/ML injection Inject 1,000 mcg into the muscle every 30 (thirty) days. Administered by Dr. Eldridge Abrahams  - every other month (per pt)    . gabapentin (NEURONTIN) 100 MG capsule Take 1 capsule (100 mg total) by mouth 2 (two) times daily. Take 1 tablet once a day (Patient taking differently: Take 100 mg by mouth daily. ) 30 capsule 0  . guaifenesin (ROBITUSSIN) 100 MG/5ML syrup Take 200 mg by mouth 3 (three) times daily as needed for cough.     . Menthol-Ascorbic Acid (LUDENS COUGH DROPS MT) Use as directed 1 lozenge in the mouth or throat  3 (three) times daily as needed (cough).     . Multiple Vitamin (MULTIVITAMIN WITH MINERALS) TABS tablet Take 1 tablet by mouth daily.    Marland Kitchen oxyCODONE-acetaminophen (PERCOCET/ROXICET) 5-325 MG tablet Take 1 tablet by mouth every 8 (eight) hours as needed for severe pain. 20 tablet 0   No current facility-administered medications for this visit.     SURGICAL HISTORY:  Past Surgical History:  Procedure Laterality Date  . ELBOW SURGERY     for  "Tennis Elbow"  . hand surgery    . IR KYPHO THORACIC  WITH BONE BIOPSY  11/27/2017  . IR KYPHO THORACIC WITH BONE BIOPSY  03/01/2018  . IR RADIOLOGIST EVAL & MGMT  11/20/2017  . IR RADIOLOGIST EVAL & MGMT  02/26/2018  . IR RADIOLOGIST EVAL & MGMT  03/13/2018  . MULTIPLE TOOTH EXTRACTIONS    . TUBAL LIGATION    . VIDEO BRONCHOSCOPY N/A 08/31/2017   Procedure: VIDEO BRONCHOSCOPY;  Surgeon: Melrose Nakayama, MD;  Location: [facility] OR;  Service: Thoracic;  Laterality: N/A;    REVIEW OF SYSTEMS:   Review of Systems  Constitutional: Negative for appetite change, chills, fatigue, fever and unexpected weight change.  HENT:   Negative for mouth sores, nosebleeds, sore throat and trouble swallowing.   Eyes: Negative for eye problems and icterus.  Respiratory: Negative for cough, hemoptysis, shortness of breath and wheezing.   Cardiovascular: Negative for chest pain and leg swelling.  Gastrointestinal: Negative for abdominal pain, constipation, diarrhea, nausea and vomiting.  Genitourinary: Negative for bladder incontinence, difficulty urinating, dysuria, frequency and hematuria.   Musculoskeletal: Positive for back pain. Negative for gait problem, neck pain and neck stiffness.  Skin: Negative for itching and rash.  Neurological: Negative for dizziness, extremity weakness, gait problem, headaches, light-headedness and seizures.  Hematological: Negative for adenopathy. Does not bruise/bleed easily.  Psychiatric/Behavioral: Negative for confusion, depression and sleep disturbance. The patient is not nervous/anxious.     PHYSICAL EXAMINATION:  Blood pressure 121/60, pulse 70, temperature 98.3 F (36.8 C), temperature source Oral, resp. rate 17, height 5' (1.524 m), weight 74 lb 12.8 oz (33.9 kg), SpO2 95 %.  ECOG PERFORMANCE STATUS: 1 - Symptomatic but completely ambulatory  Physical Exam  Constitutional: Oriented to person, place, and time and well-developed, well-nourished, and in no distress. No distress.  HENT:  Head: Normocephalic and atraumatic.   Mouth/Throat: Oropharynx is clear and moist. No oropharyngeal exudate.  Eyes: Conjunctivae are normal. Right eye exhibits no discharge. Left eye exhibits no discharge. No scleral icterus.  Neck: Normal range of motion. Neck supple.  Cardiovascular: Normal rate, regular rhythm, normal heart sounds and intact distal pulses.   Pulmonary/Chest: Diffuse wheezing bilaterally. Effort normal and breath sounds normal. No respiratory distress. No rales.  Abdominal: Soft. Bowel sounds are normal. Exhibits no distension and no mass. There is no tenderness.  Musculoskeletal: Normal range of motion. Exhibits no edema.  Lymphadenopathy:    No cervical adenopathy.  Neurological: Alert and oriented to person, place, and time. Exhibits normal muscle tone. Gait normal. Coordination normal.  Skin: Skin is warm and dry. No rash noted. Not diaphoretic. No erythema. No pallor.  Psychiatric: Mood, memory and judgment normal.  Vitals reviewed.  LABORATORY DATA: Lab Results  Component Value Date   WBC 4.7 03/21/2018   HGB 11.7 (L) 03/21/2018   HCT 38.8 03/21/2018   MCV 92.2 03/21/2018   PLT 320 03/21/2018      Chemistry      Component Value Date/Time  NA 141 03/21/2018 0900   K 3.9 03/21/2018 0900   CL 107 03/21/2018 0900   CO2 24 03/21/2018 0900   BUN 10 03/21/2018 0900   CREATININE 0.72 03/21/2018 0900      Component Value Date/Time   CALCIUM 8.9 03/21/2018 0900   ALKPHOS 110 03/21/2018 0900   AST 16 03/21/2018 0900   ALT 7 03/21/2018 0900   BILITOT 0.4 03/21/2018 0900       RADIOGRAPHIC STUDIES:  Ct Chest W Contrast  Result Date: 03/20/2018 CLINICAL DATA:  73 year old female with history of poorly differentiated squamous cell carcinoma of the lung status post chemotherapy and radiation therapy. EXAM: CT CHEST WITH CONTRAST TECHNIQUE: Multidetector CT imaging of the chest was performed during intravenous contrast administration. CONTRAST:  75mL OMNIPAQUE IOHEXOL 300 MG/ML  SOLN COMPARISON:   Chest CT 09/16/2017. FINDINGS: Cardiovascular: Heart size is normal. There is no significant pericardial fluid, thickening or pericardial calcification. There is aortic atherosclerosis, as well as atherosclerosis of the great vessels of the mediastinum and the coronary arteries, including calcified atherosclerotic plaque in the left anterior descending coronary artery. Mediastinum/Nodes: No pathologically enlarged mediastinal or hilar lymph nodes. Esophagus is unremarkable in appearance. No axillary lymphadenopathy. Lungs/Pleura: Previously noted mass-like area of architectural distortion in the medial aspect of the left lower lobe has significantly decreased in size compared to the prior study, most compatible with an area of postradiation mass-like fibrosis. Scattered surrounding areas of septal thickening, regional architectural distortion and nodularity are also noted in adjacent areas of both the left upper and left lower lobes, also compatible with evolving postradiation changes. 7 mm nodule in the superior segment of the right lower lobe (axial image 61 of series 7) is stable dating back to 07/13/2017, potentially benign. No other definite suspicious appearing pulmonary nodules or masses are noted. Diffuse bronchial wall thickening with moderate centrilobular and paraseptal emphysema. Trace left pleural effusion. Upper Abdomen: Small calcified granulomas in the right lobe of the liver. Musculoskeletal: Chronic compression fractures of T9 and T10, similar to the prior examination with post vertebroplasty changes at both levels. There are no aggressive appearing lytic or blastic lesions noted in the visualized portions of the skeleton. IMPRESSION: 1. Positive response to therapy with decreased size of treated lesion in the left lower lobe with evolving postradiation changes in the left lung, as above. 2. Trace left pleural effusion. 3. Stable 7 mm nodule in the superior segment of the right lower lobe, favored  to be benign. Continued attention on follow-up studies is recommended. 4. Aortic atherosclerosis, in addition to left anterior descending coronary artery disease. Assessment for potential risk factor modification, dietary therapy or pharmacologic therapy may be warranted, if clinically indicated. 5. Diffuse bronchial wall thickening with moderate centrilobular and paraseptal emphysema; imaging findings suggestive of underlying COPD. Aortic Atherosclerosis (ICD10-I70.0) and Emphysema (ICD10-J43.9). Electronically Signed   By: Vinnie Langton M.D.   On: 03/20/2018 09:36   Ct Thoracic Spine Wo Contrast  Addendum Date: 03/07/2018   ADDENDUM REPORT: 03/07/2018 13:56 ADDENDUM: I reviewed this study with Dr. Vernard Gambles. In retrospect there is mild convexity of the superior endplate of Y40 when compared to prior thoracic spine MRI of 02/26/2018 and thoracic spine CT scan from 11/01/2017. This would suggest a new/acute superior endplate fracture. Electronically Signed   By: Marijo Sanes M.D.   On: 03/07/2018 13:56   Result Date: 03/07/2018 CLINICAL DATA:  Recent T10 kyphoplasty 1 week ago. Persistent back pain. EXAM: CT THORACIC SPINE WITHOUT CONTRAST TECHNIQUE: Multidetector CT  images of the thoracic were obtained using the standard protocol without intravenous contrast. COMPARISON:  MRI thoracic spine 02/26/2018 FINDINGS: Alignment: Normal Vertebrae: Vertebral augmentation changes noted at T9 and T10. No complicating features are demonstrated. No extrusion of bone cement is demonstrated. No further collapse of the vertebral bodies. No new compression fractures are identified. No retropulsion or canal stenosis. Paraspinal and other soft tissues: Stable severe emphysematous changes and left lower lobe pulmonary mass/radiation change. There is also a persistent small left effusion. Disc levels: The spinal canal is generous. No obvious thoracic disc protrusions, spinal or foraminal stenosis. IMPRESSION: 1. Vertebral  augmentation changes at T9 and E95 without complicating features. 2. No new/acute thoracic spine compression fractures are identified. 3. Generous spinal canal without evidence for large disc protrusions, spinal or foraminal stenosis. 4. Chronic lung changes as detailed above. Electronically Signed: By: Marijo Sanes M.D. On: 03/07/2018 10:23   Mr Thoracic Spine W Wo Contrast  Result Date: 02/27/2018 CLINICAL DATA:  History of bending over to pick something up, now with severe back pain. History of T9 vertebral augmentation for benign osteopenic compression fracture. Abnormal plain films show early compression at T10. EXAM: MRI THORACIC AND LUMBAR SPINE WITHOUT AND WITH CONTRAST TECHNIQUE: Multiplanar and multiecho pulse sequences of the thoracic and lumbar spine were obtained without and with intravenous contrast. CONTRAST:  Gadavist 3 mL COMPARISON:  CT chest 11/01/2017.  Chest radiograph 01/25/2018. FINDINGS: MRI THORACIC SPINE FINDINGS Alignment:  Physiologic. Vertebrae: Treated T9 compression fracture, status post kyphoplasty, mild residual bone marrow edema, preserved vertebral body height. New T10 compression fracture, loss of approximately 25% vertebral body height, superior endplate depression, diffuse bone marrow edema, no definite osseous destruction, suspected to represent adjacent segment posttraumatic benign osteopenic phenomenon. Mild postcontrast enhancement of T10, slight postcontrast enhancement of T9. No significant retropulsion. No findings concerning for epidural tumor or epidural hematoma. Cord:  Normal signal and morphology. Paraspinal and other soft tissues: LEFT pleural effusion/pleural thickening. COPD. Disc levels: No disc protrusion or epidural tumor. MRI LUMBAR SPINE FINDINGS Segmentation:  Standard. Alignment:  Anatomic. Vertebrae: No worrisome osseous lesion. No postcontrast enhancement. Conus medullaris: Extends to the L1-L2 level and appears normal. Paraspinal and other soft  tissues: Unremarkable. Disc levels: No compressive disc protrusion or spinal stenosis. IMPRESSION: Status post T9 kyphoplasty, for benign osteopenic compression fracture, with preserved vertebral body height. New T10 compression fracture, slightly progressed from plain films, slight anterior wedging, consistent with an adjacent segment injury. Vertebral augmentation at T10 may be warranted. Although there are no worrisome imaging features, tissue sampling could be performed as clinically indicated. No features worrisome for epidural tumor or epidural hematoma. Normal cord signal and morphology throughout. Unremarkable lumbar spine MRI. No compression deformities. No features worrisome for metastatic disease to the lumbosacral spine. Electronically Signed   By: Staci Righter M.D.   On: 02/27/2018 11:25   Mr Lumbar Spine W Wo Contrast  Result Date: 02/27/2018 CLINICAL DATA:  History of bending over to pick something up, now with severe back pain. History of T9 vertebral augmentation for benign osteopenic compression fracture. Abnormal plain films show early compression at T10. EXAM: MRI THORACIC AND LUMBAR SPINE WITHOUT AND WITH CONTRAST TECHNIQUE: Multiplanar and multiecho pulse sequences of the thoracic and lumbar spine were obtained without and with intravenous contrast. CONTRAST:  Gadavist 3 mL COMPARISON:  CT chest 11/01/2017.  Chest radiograph 01/25/2018. FINDINGS: MRI THORACIC SPINE FINDINGS Alignment:  Physiologic. Vertebrae: Treated T9 compression fracture, status post kyphoplasty, mild residual bone marrow edema,  preserved vertebral body height. New T10 compression fracture, loss of approximately 25% vertebral body height, superior endplate depression, diffuse bone marrow edema, no definite osseous destruction, suspected to represent adjacent segment posttraumatic benign osteopenic phenomenon. Mild postcontrast enhancement of T10, slight postcontrast enhancement of T9. No significant retropulsion. No  findings concerning for epidural tumor or epidural hematoma. Cord:  Normal signal and morphology. Paraspinal and other soft tissues: LEFT pleural effusion/pleural thickening. COPD. Disc levels: No disc protrusion or epidural tumor. MRI LUMBAR SPINE FINDINGS Segmentation:  Standard. Alignment:  Anatomic. Vertebrae: No worrisome osseous lesion. No postcontrast enhancement. Conus medullaris: Extends to the L1-L2 level and appears normal. Paraspinal and other soft tissues: Unremarkable. Disc levels: No compressive disc protrusion or spinal stenosis. IMPRESSION: Status post T9 kyphoplasty, for benign osteopenic compression fracture, with preserved vertebral body height. New T10 compression fracture, slightly progressed from plain films, slight anterior wedging, consistent with an adjacent segment injury. Vertebral augmentation at T10 may be warranted. Although there are no worrisome imaging features, tissue sampling could be performed as clinically indicated. No features worrisome for epidural tumor or epidural hematoma. Normal cord signal and morphology throughout. Unremarkable lumbar spine MRI. No compression deformities. No features worrisome for metastatic disease to the lumbosacral spine. Electronically Signed   By: Staci Righter M.D.   On: 02/27/2018 11:25   Ir Kypho Thoracic With Bone Biopsy  Result Date: 03/01/2018 CLINICAL DATA:  History of lung carcinoma stage III. Previous T9 painful vertebral compression fracture in November 2019, with good response to percutaneous kyphoplasty. She presented recently with new midthoracic pain. Imaging demonstrated a benign-appearing subacute T10 compression fracture. EXAM: KYPHOPLASTY AT THORACIC T10 DEEP BONE T10 VERTEBRAL BODY CORE BIOPSY TECHNIQUE: The procedure, risks (including but not limited to bleeding, infection, organ damage), benefits, and alternatives were explained to the patient. Questions regarding the procedure were encouraged and answered. The patient  understands and consents to the procedure. The patient was placed prone on the fluoroscopic table. The skin overlying the lower thoracic region was then prepped and draped in the usual sterile fashion. Maximal barrier sterile technique was utilized including caps, mask, sterile gowns, sterile gloves, sterile drape, hand hygiene and skin antiseptic. Intravenous Fentanyl and Versed were administered as conscious sedation during continuous cardiorespiratory monitoring by the radiology RN, with a total moderate sedation time of26 minutes. As antibiotic prophylaxis, Cipro 900 mg was ordered pre-procedure and administered intravenously within !one hour! of incision. The right pedicle at thoracic T10 was then infiltrated with 1% lidocaine followed by the advancement of a Kyphon trocar needle through the right pedicle into the posterior one-third. The trocar was removed and coaxial core bone biopsy was obtained. Subsequently the osteo drill was advanced to the anterior third of the vertebral body. The osteo drill was retracted. Through the working cannula, a Kyphon inflatable bone tamp 15 x 2 was advanced and positioned with the distal marker 5 mm from the anterior aspect of the cortex. Crossing of the midline was seen on the AP projection. At this time, the balloon was expanded using contrast via a Kyphon inflation syringe device via micro tubing. Good positioning was seen on AP and lateral imaging. Inflation continued until there was near apposition across the midline and towards the superior and the inferior end plates. At this time, methylmethacrylate mixture was reconstituted in the Kyphon bone mixing device system. This was then loaded into the delivery mechanism, attached to Kyphon cement delivery system The balloon was deflated and removed followed by the instillation of methylmethacrylate mixture  with excellent filling in the AP and lateral projections. No extravasation was noted in the disk spaces or posteriorly  into the spinal canal. No epidural venous contamination was seen. The patient tolerated the procedure well. There were no acute complications. The working cannulae and the bone filler were then retrieved and removed. COMPLICATIONS: COMPLICATIONS None immediate. IMPRESSION: 1. Status post vertebral body augmentation using balloon kyphoplasty at thoracic T10 as described without event. 2. Deep core bone biopsy of T10 vertebral body. 3. Per CMS PQRS reporting requirements (PQRS Measure 24): Given the patient's age of greater than 73 and the fracture site (hip, distal radius, or spine), the patient should be tested for osteoporosis using DXA, and the appropriate treatment considered based on the DXA results. Electronically Signed   By: Lucrezia Europe M.D.   On: 03/01/2018 14:33   Ir Radiologist Eval & Mgmt  Result Date: 03/13/2018 Please refer to notes tab for details about interventional procedure. (Op Note)  Ir Radiologist Eval & Mgmt  Result Date: 02/26/2018 Please refer to notes tab for details about interventional procedure. (Op Note)    ASSESSMENT/PLAN:  This is a very pleasant 73 year old Caucasian female with stage IIIa non-small cell lung cancer, poorly differentiated squamous cell carcinoma.  She was diagnosed in August 2019.  The patient underwent a course of concurrent chemoradiation with weekly carboplatin for an AUC of 2 and paclitaxel 45 mg/m.  She is status post 7 cycles with a partial response.  The patient is currently undergoing consolidation immunotherapy with Imfinzi.  She is tolerating treatment well without any adverse effects.  Recommend she proceed with cycle #7 today as scheduled. The patient was seen with Dr. Julien Nordmann today.  She recently had a repeat CT scan.  Dr. Julien Nordmann personally and independently reviewed the scan results which showed a mild reduction in size of the left lower lobe lesion.  I recommend she proceed with the current treatment regimen. I will see her back in 2  weeks for repeat labs and evaluation prior to starting cycle #8. For her compression fracture she is followed closely by IR.  Recommend she proceed with a kyphoplasty next week as scheduled. She will continue on her current pain regimen as prescribed by her PCP, who is also managing her osteoporosis.  The patient was advised to call immediately if she has any concerning symptoms in the interval. The patient voices understanding of current disease status and treatment options and is in agreement with the current care plan. All questions were answered. The patient knows to call the clinic with any problems, questions or concerns. We can certainly see the patient much sooner if necessary  No orders of the defined types were placed in this encounter.   Cassandra L Heilingoetter, PA-C 03/21/18  ADDENDUM: Hematology/Oncology Attending: I had a face-to-face encounter with the patient.  I recommended her care plan.  This is a very pleasant 74 years old white female with a stage IIIa non-small cell lung cancer, squamous cell carcinoma status post induction concurrent chemoradiation with weekly carboplatin and paclitaxel with partial response.  The patient is currently undergoing treatment with consolidation immunotherapy with Imfinzi status post 6 cycles.  She has been tolerating this treatment well with no concerning adverse effects. She had repeat CT scan of the chest performed recently.  I personally and independently reviewed the scans and discussed the results with the patient today. Her scan showed no concerning findings for disease progression and there was some mild improvement. I recommended for the patient  to continue her current treatment with Imfinzi and she will proceed with cycle #7 today. I will see the patient back for follow-up visit in 2 weeks for evaluation before the next cycle of her treatment. For the persistent back pain and compression fracture, she will see interventional  radiology again for consideration of repeat vertebroplasty. The patient will continue to receive her pain medication from her primary care physician at this point. She was advised to call immediately if she has any concerning symptoms in the interval. Disclaimer: This note was dictated with voice recognition software. Similar sounding words can inadvertently be transcribed and may be missed upon review. Eilleen Kempf, MD 03/23/18

## 2018-03-22 ENCOUNTER — Telehealth: Payer: Self-pay | Admitting: Internal Medicine

## 2018-03-22 NOTE — Telephone Encounter (Signed)
Added additional cycles per 3/12 los - pt to get an updated schedule next visit.

## 2018-03-23 ENCOUNTER — Encounter: Payer: Self-pay | Admitting: Physician Assistant

## 2018-03-25 ENCOUNTER — Other Ambulatory Visit: Payer: Self-pay | Admitting: Radiology

## 2018-03-25 ENCOUNTER — Other Ambulatory Visit: Payer: Self-pay | Admitting: Physician Assistant

## 2018-03-26 ENCOUNTER — Ambulatory Visit (HOSPITAL_COMMUNITY)
Admission: RE | Admit: 2018-03-26 | Discharge: 2018-03-26 | Disposition: A | Discharge status: Home / Self Care | Payer: Medicare Other | Source: Ambulatory Visit | Attending: Interventional Radiology | Admitting: Interventional Radiology

## 2018-03-26 ENCOUNTER — Encounter (HOSPITAL_COMMUNITY): Payer: Self-pay | Admitting: Interventional Radiology

## 2018-03-26 ENCOUNTER — Other Ambulatory Visit: Payer: Self-pay

## 2018-03-26 DIAGNOSIS — Z87891 Personal history of nicotine dependence: Secondary | ICD-10-CM | POA: Insufficient documentation

## 2018-03-26 DIAGNOSIS — X58XXXD Exposure to other specified factors, subsequent encounter: Secondary | ICD-10-CM | POA: Insufficient documentation

## 2018-03-26 DIAGNOSIS — S22080G Wedge compression fracture of T11-T12 vertebra, subsequent encounter for fracture with delayed healing: Secondary | ICD-10-CM | POA: Insufficient documentation

## 2018-03-26 DIAGNOSIS — Z79899 Other long term (current) drug therapy: Secondary | ICD-10-CM | POA: Diagnosis not present

## 2018-03-26 DIAGNOSIS — Z880 Allergy status to penicillin: Secondary | ICD-10-CM | POA: Diagnosis not present

## 2018-03-26 DIAGNOSIS — G709 Myoneural disorder, unspecified: Secondary | ICD-10-CM | POA: Diagnosis not present

## 2018-03-26 DIAGNOSIS — R896 Abnormal cytological findings in specimens from other organs, systems and tissues: Secondary | ICD-10-CM | POA: Insufficient documentation

## 2018-03-26 DIAGNOSIS — J449 Chronic obstructive pulmonary disease, unspecified: Secondary | ICD-10-CM | POA: Diagnosis not present

## 2018-03-26 DIAGNOSIS — M1990 Unspecified osteoarthritis, unspecified site: Secondary | ICD-10-CM | POA: Insufficient documentation

## 2018-03-26 DIAGNOSIS — Z9851 Tubal ligation status: Secondary | ICD-10-CM | POA: Insufficient documentation

## 2018-03-26 HISTORY — PX: IR KYPHO THORACIC WITH BONE BIOPSY: IMG5518

## 2018-03-26 LAB — CBC
HCT: 42.5 % (ref 36.0–46.0)
Hemoglobin: 12.5 g/dL (ref 12.0–15.0)
MCH: 27.1 pg (ref 26.0–34.0)
MCHC: 29.4 g/dL — ABNORMAL LOW (ref 30.0–36.0)
MCV: 92.2 fL (ref 80.0–100.0)
PLATELETS: 323 10*3/uL (ref 150–400)
RBC: 4.61 MIL/uL (ref 3.87–5.11)
RDW: 16.7 % — AB (ref 11.5–15.5)
WBC: 4.9 10*3/uL (ref 4.0–10.5)
nRBC: 0 % (ref 0.0–0.2)

## 2018-03-26 LAB — BASIC METABOLIC PANEL
Anion gap: 8 (ref 5–15)
BUN: 12 mg/dL (ref 8–23)
CO2: 27 mmol/L (ref 22–32)
CREATININE: 0.75 mg/dL (ref 0.44–1.00)
Calcium: 9.1 mg/dL (ref 8.9–10.3)
Chloride: 103 mmol/L (ref 98–111)
GFR calc Af Amer: >60 mL/min (ref >60)
GFR calc non Af Amer: >60 mL/min (ref >60)
Glucose, Bld: 87 mg/dL (ref 70–99)
Potassium: 3.3 mmol/L — ABNORMAL LOW (ref 3.5–5.1)
Sodium: 138 mmol/L (ref 135–145)

## 2018-03-26 LAB — PROTIME-INR
INR: 1 (ref 0.8–1.2)
Prothrombin Time: 12.6 seconds (ref 11.4–15.2)

## 2018-03-26 MED ORDER — MIDAZOLAM HCL 2 MG/2ML IJ SOLN
INTRAMUSCULAR | Status: AC | PRN
  Administered 2018-03-26: 0.5 mg via INTRAVENOUS
  Administered 2018-03-26: 1 mg via INTRAVENOUS
  Administered 2018-03-26: 0.5 mg via INTRAVENOUS

## 2018-03-26 MED ORDER — LIDOCAINE HCL 1 % IJ SOLN
INTRAMUSCULAR | Status: AC | PRN
  Administered 2018-03-26: 15 mL

## 2018-03-26 MED ORDER — VANCOMYCIN HCL IN DEXTROSE 1-5 GM/200ML-% IV SOLN
INTRAVENOUS | Status: AC
  Administered 2018-03-26: 1000 mg via INTRAVENOUS
  Filled 2018-03-26: qty 200

## 2018-03-26 MED ORDER — SODIUM CHLORIDE 0.9 % IV SOLN
INTRAVENOUS | Status: DC
  Administered 2018-03-26: 13:00:00 via INTRAVENOUS

## 2018-03-26 MED ORDER — LIDOCAINE HCL 1 % IJ SOLN
INTRAMUSCULAR | Status: AC
  Filled 2018-03-26: qty 20

## 2018-03-26 MED ORDER — FENTANYL CITRATE (PF) 100 MCG/2ML IJ SOLN
INTRAMUSCULAR | Status: AC
  Filled 2018-03-26: qty 2

## 2018-03-26 MED ORDER — IOHEXOL 300 MG/ML  SOLN: 20.0000 mL | Freq: Once | INTRAMUSCULAR | Status: DC | PRN

## 2018-03-26 MED ORDER — FENTANYL CITRATE (PF) 100 MCG/2ML IJ SOLN
INTRAMUSCULAR | Status: AC | PRN
  Administered 2018-03-26 (×2): 50 ug via INTRAVENOUS

## 2018-03-26 MED ORDER — MIDAZOLAM HCL 2 MG/2ML IJ SOLN
INTRAMUSCULAR | Status: AC
  Filled 2018-03-26: qty 4

## 2018-03-26 MED ORDER — VANCOMYCIN HCL IN DEXTROSE 1-5 GM/200ML-% IV SOLN
1000.0000 mg | INTRAVENOUS | Status: AC
  Administered 2018-03-26: 1000 mg via INTRAVENOUS

## 2018-03-26 NOTE — Discharge Instructions (Addendum)
Percutaneous Vertebroplasty, Care After °This sheet gives you information about how to care for yourself after your procedure. Your doctor may also give you more specific instructions. If you have problems or questions, contact your doctor. °Follow these instructions at home: °Surgical cut (incision) care °· Follow instructions from your doctor about how to take care of your cut from surgery. Make sure you: °? Wash your hands with soap and water before you change your bandage (dressing). If you cannot use soap and water, use hand sanitizer. °? Change your bandage as told by your doctor. °? Leave stitches (sutures), skin glue, or skin tape (adhesive) strips in place. They may need to stay in place for 2 weeks or longer. If tape strips get loose and curl up, you may trim the loose edges. Do not remove tape strips completely unless your doctor says it is okay. °· Check your surgical cut area every day for signs of infection. Check for: °? Redness, swelling, or pain. °? Fluid or blood. °? Warmth. °? Pus or a bad smell. °· Keep the bandage dry as told by your doctor. Do not shower or bathe until your doctor says it is okay. °Managing pain, stiffness, and swelling ° °· If directed, apply ice to the affected area: °? Put ice in a plastic bag. °? Place a towel between your skin and bag. °? Leave the ice on for 20 minutes, 2-3 times a day. °· Rest for 24 hrs after the procedure or as told by your doctor. °Activity °· Slowly return to normal activities as told by your doctor. °· Ask what type of stretching and strengthening exercises you should do. °· Do not bend or lift anything greater than 10 lb (4.5 kg). Follow your doctor's instructions about bending and lifting. °General instructions °· Take over-the-counter and prescription medicines only as told by your doctor. °· Do not drive for 24 hours if you were given a medicine to help you relax (sedative). °· To prevent or treat constipation while you are taking prescription  pain medicine, your doctor may recommend that you: °? Drink enough fluid to keep your pee (urine) clear or pale yellow. °? Take over-the-counter or prescription medicines. °? Eat foods that are high in fiber, such as: °§ Fresh fruits. °§ Fresh vegetables. °§ Whole grains. °§ Beans. °? Limit foods that are high in fat and processed sugars, such as fried and sweet foods. °? Keep all follow-up visits as told by your doctor. This is important. °Contact a doctor if: °· You have redness, swelling, or pain around your cut. °· You have fluid or blood coming from your cut. °· Your cut feels warm to the touch. °· You have pus or bad smell coming from your cut. °· You have a fever. °· You are sick to your stomach (nauseous) or throw up (vomit) for more than 24 hours. °· Your back pain does not get better. °Get help right away if: °· You have very bad back pain that comes on all of a sudden. °· You cannot control when you pee or poop (bowel movement). °· You lose feeling (become numb) or have tingling in your legs or feet, or they become weak. °· You have new tingling, numbness, or weakness in your legs or feet. °· You have sudden weakness in your legs. °· You have pain that shoots down your legs. °· You have chest pain. °· You have trouble breathing. °· You are short of breath. °· You feel dizzy or you pass   out (faint).  Your vision changes or you cannot talk as you normally do. Summary  Rest for 24 hrs after the procedure and return slowly to normal activities as told by your doctor.  Do not drive for 24 hours if you were given a medicine to help you relax (sedative).  Take over-the-counter and prescription medicines only as told by your doctor. This information is not intended to replace advice given to you by your health care provider. Make sure you discuss any questions you have with your health care provider. Document Released: 03/22/2009 Document Revised: 03/28/2016 Document Reviewed: 03/28/2016 Elsevier  Interactive Patient Education  2019 [place]. Balloon Kyphoplasty, Care After Refer to this sheet in the next few weeks. These instructions provide you with information about caring for yourself after your procedure. Your health care provider may also give you more specific instructions. Your treatment has been planned according to current medical practices, but problems sometimes occur. Call your health care provider if you have any problems or questions after your procedure. What can I expect after the procedure? After your procedure, it is common to have back pain. Follow these instructions at home: Incision care  Follow instructions from your health care provider about how to take care of your incisions. Make sure you: ? Wash your hands with soap and water before you change your bandage (dressing). If soap and water are not available, use hand sanitizer. ? Change your dressing as told by your health care provider. ? Leave stitches (sutures), skin glue, or adhesive strips in place. These skin closures may need to be in place for 2 weeks or longer. If adhesive strip edges start to loosen and curl up, you may trim the loose edges. Do not remove adhesive strips completely unless your health care provider tells you to do that.  Check your incision area every day for signs of infection. Watch for: ? Redness, swelling, or pain. ? Fluid, blood, or pus.  Keep your dressing dry until your health care provider says that it can be removed. Activity   Rest your back and avoid intense physical activity for as long as told by your health care provider.  Return to your normal activities as told by your health care provider. Ask your health care provider what activities are safe for you.  Do not lift anything that is heavier than 10 lb (4.5 kg). This is about the weight of a gallon of milk.You may need to avoid heavy lifting for several weeks. General instructions  Take over-the-counter and  prescription medicines only as told by your health care provider.  If directed, apply ice to the painful area: ? Put ice in a plastic bag. ? Place a towel between your skin and the bag. ? Leave the ice on for 20 minutes, 2-3 times per day.  Do not use tobacco products, including cigarettes, chewing tobacco, or e-cigarettes. If you need help quitting, ask your health care provider.  Keep all follow-up visits as told by your health care provider. This is important. Contact a health care provider if:  You have a fever.  You have redness, swelling, or pain at the site of your incisions.  You have fluid, blood, or pus coming from your incisions.  You have pain that gets worse or does not get better with medicine.  You develop numbness or weakness in any part of your body. Get help right away if:  You have chest pain.  You have difficulty breathing.  You cannot move  your legs.  You cannot control your bladder or bowel movements.  You suddenly become weak or numb on one side of your body.  You become very confused.  You have trouble speaking or understanding, or both. This information is not intended to replace advice given to you by your health care provider. Make sure you discuss any questions you have with your health care provider. Document Released: 09/16/2014 Document Revised: 06/03/2015 Document Reviewed: 04/20/2014 Elsevier Interactive Patient Education  2019 [place]. Moderate Conscious Sedation, Adult, Care After These instructions provide you with information about caring for yourself after your procedure. Your health care provider may also give you more specific instructions. Your treatment has been planned according to current medical practices, but problems sometimes occur. Call your health care provider if you have any problems or questions after your procedure. What can I expect after the procedure? After your procedure, it is common:  To feel sleepy for  several hours.  To feel clumsy and have poor balance for several hours.  To have poor judgment for several hours.  To vomit if you eat too soon. Follow these instructions at home: For at least 24 hours after the procedure:   Do not: ? Participate in activities where you could fall or become injured. ? Drive. ? Use heavy machinery. ? Drink alcohol. ? Take sleeping pills or medicines that cause drowsiness. ? Make important decisions or sign legal documents. ? Take care of children on your own.  Rest. Eating and drinking  Follow the diet recommended by your health care provider.  If you vomit: ? Drink water, juice, or soup when you can drink without vomiting. ? Make sure you have little or no nausea before eating solid foods. General instructions  Have a responsible adult stay with you until you are awake and alert.  Take over-the-counter and prescription medicines only as told by your health care provider.  If you smoke, do not smoke without supervision.  Keep all follow-up visits as told by your health care provider. This is important. Contact a health care provider if:  You keep feeling nauseous or you keep vomiting.  You feel light-headed.  You develop a rash.  You have a fever. Get help right away if:  You have trouble breathing. This information is not intended to replace advice given to you by your health care provider. Make sure you discuss any questions you have with your health care provider. Document Released: 10/16/2012 Document Revised: 05/31/2015 Document Reviewed: 04/17/2015 Elsevier Interactive Patient Education  2019 [employer].

## 2018-03-26 NOTE — H&P (Signed)
Chief Complaint: Patient was seen in consultation today for T11 kyphoplasty/vertebroplasty.  Referring Physician(s): Bruning, Ashlyn  Supervising Physician: Arne Cleveland  Patient Status: [facility] - Out-pt  History of Present Illness: Sherry [name] is a 73 y.o. female with a past medical history of headaches, neuromuscular disorder, pneumonia, COPD, lung cancer, anxiety, and depression. She is known to IR and has been followed by Dr. Vernard Gambles since 11/2017. She first presented to our department at the request of Ashlyn Bruning, PA-C for management of compression fractures. She first consulted with Dr. Vernard Gambles 11/20/2017 to discuss management options. At that time, patient decided to pursue kyphoplasty/vertebroplasty for management. She underwent an image-guided T9 kyphoplasty with bone biopsy 11/27/2017 by Dr. Vernard Gambles. Patient did well post-procedure, but in early 2020 had new onset of mid to low back pain. She then re-consulted with Dr. Vernard Gambles 02/26/2018 to discuss further management options. She was found to have a T10 compression fracture (based on DG thoracic spine 01/25/2018), and decided to again pursue kyphoplasty/vertebroplasty for management. She underwent an image-guided T10 kyphoplasty with bone biopsy 03/01/2018 by Dr. Vernard Gambles. Her pain was never fully relieved from this procedure, and she consulted again with Dr. Vernard Gambles 03/13/2018 for further management. She was found to have a T11 compression fracture (based on CT thoracic spine 03/06/2018), and again decided to pursue kyphoplasty/vertebroplasty for management.  CT thoracic spine 03/06/2018: 1. Vertebral augmentation changes at T9 and Y09 without complicating features. 2. No new/acute thoracic spine compression fractures are identified. 3. Generous spinal canal without evidence for large disc protrusions, spinal or foraminal stenosis. 4. Chronic lung changes as detailed above. Addendum: I reviewed this study with Dr. Vernard Gambles. In  retrospect there is mild convexity of the superior endplate of X83 when compared to prior thoracic spine MRI of 02/26/2018 and thoracic spine CT scan from 11/01/2017. This would suggest a new/acute superior endplate fracture.  Patient presents today for possible T11 kyphoplasty/vertebroplasty with possible bone biopsy. Patient awake and alert laying in bed. Accompanied by female at bedside. Complains of midline mid back pain rated 10/10 at this time. Denies fever, chills, chest pain, dyspnea, urinary/bowel incontinence, or numbness/tingling down legs.   Past Medical History:  Diagnosis Date   Allergy    Anxiety    Arthritis    Cavitating mass in left lower lung lobe    Complication of anesthesia    woke up during procedure   COPD (chronic obstructive pulmonary disease) (HCC)    Depression    Family history of adverse reaction to anesthesia    " my mother was always hard to wake up"   Full dentures    Headache    Neuromuscular disorder ([place])    Pneumonia    Thoracic spine fracture ([place]) 11/01/2017   T9   Wears glasses     Past Surgical History:  Procedure Laterality Date   ELBOW SURGERY     for  "Tennis Elbow"   hand surgery     IR KYPHO THORACIC WITH BONE BIOPSY  11/27/2017   IR KYPHO THORACIC WITH BONE BIOPSY  03/01/2018   IR RADIOLOGIST EVAL & MGMT  11/20/2017   IR RADIOLOGIST EVAL & MGMT  02/26/2018   IR RADIOLOGIST EVAL & MGMT  03/13/2018   MULTIPLE TOOTH EXTRACTIONS     TUBAL LIGATION     VIDEO BRONCHOSCOPY N/A 08/31/2017   Procedure: VIDEO BRONCHOSCOPY;  Surgeon: Melrose Nakayama, MD;  Location: [place];  Service: Thoracic;  Laterality: N/A;    Allergies: Penicillins  Medications: Prior to Admission medications   Medication Sig Start Date End Date Taking? Authorizing Provider  acetaminophen (TYLENOL 8 HOUR) 650 MG CR tablet Take 1 tablet (650 mg total) by mouth every 8 (eight) hours as needed for pain or fever. 10/30/17  Yes Varney Biles, MD  alendronate (FOSAMAX) 70 MG tablet Take 70 mg by mouth once a week. 03/08/18  Yes [provider]  cyanocobalamin (,VITAMIN B-12,) 1000 MCG/ML injection Inject 1,000 mcg into the muscle every 30 (thirty) days. Administered by Dr. Eldridge Abrahams  - every other month (per pt)   Yes [provider]  gabapentin (NEURONTIN) 100 MG capsule Take 1 capsule (100 mg total) by mouth 2 (two) times daily. Take 1 tablet once a day Patient taking differently: Take 100 mg by mouth daily.  03/06/18  Yes Heilingoetter, Cassandra L, PA-C  Menthol-Ascorbic Acid (LUDENS COUGH DROPS MT) Use as directed 1 lozenge in the mouth or throat 3 (three) times daily as needed (cough).    Yes [provider]  Multiple Vitamin (MULTIVITAMIN WITH MINERALS) TABS tablet Take 1 tablet by mouth daily.   Yes [provider]  oxyCODONE-acetaminophen (PERCOCET/ROXICET) 5-325 MG tablet Take 1 tablet by mouth every 8 (eight) hours as needed for severe pain. 02/21/18  Yes Curt Bears, MD  guaifenesin (ROBITUSSIN) 100 MG/5ML syrup Take 200 mg by mouth 3 (three) times daily as needed for cough.     [provider]     Family History  Problem Relation Age of Onset   Diabetes Mother     Social History   Socioeconomic History   Marital status: Divorced    Spouse name: Not on file   Number of children: Not on file   Years of education: Not on file   Highest education level: Not on file  Occupational History   Not on file  Social Needs   Financial resource strain: Not on file   Food insecurity:    Worry: Not on file    Inability: Not on file   Transportation needs:    Medical: Not on file    Non-medical: Not on file  Tobacco Use   Smoking status: Former Smoker    Packs/day: 1.00    Years: 50.00    Pack years: 50.00    Types: Cigarettes    Last attempt to quit: 08/24/2017    Years since quitting: 0.5   Smokeless tobacco: Never Used  Substance and Sexual Activity    Alcohol use: No   Drug use: No   Sexual activity: Never  Lifestyle   Physical activity:    Days per week: Not on file    Minutes per session: Not on file   Stress: Not on file  Relationships   Social connections:    Talks on phone: Not on file    Gets together: Not on file    Attends religious service: Not on file    Active member of club or organization: Not on file    Attends meetings of clubs or organizations: Not on file    Relationship status: Not on file  Other Topics Concern   Not on file  Social History Narrative   Not on file     Review of Systems: A 12 point ROS discussed and pertinent positives are indicated in the HPI above.  All other systems are negative.  Review of Systems  Constitutional: Negative for chills and fever.  Respiratory: Negative for shortness of breath and wheezing.  Cardiovascular: Negative for chest pain and palpitations.  Gastrointestinal: Negative for abdominal pain.       Negative for bowel incontinence.  Genitourinary:       Negative for bladder incontinence.  Musculoskeletal: Positive for back pain.  Neurological: Negative for numbness.  Psychiatric/Behavioral: Negative for behavioral problems and confusion.    Vital Signs: BP 106/72    Pulse 69    Temp 97.8 F (36.6 C) (Oral)    Ht 5\' 1"  (1.549 m)    Wt 72 lb (32.7 kg)    SpO2 98%    BMI 13.60 kg/m   Physical Exam Vitals signs and nursing note reviewed.  Constitutional:      General: She is not in acute distress.    Appearance: Normal appearance.  Cardiovascular:     Rate and Rhythm: Normal rate and regular rhythm.     Heart sounds: Normal heart sounds. No murmur.  Pulmonary:     Effort: Pulmonary effort is normal. No respiratory distress.     Breath sounds: Normal breath sounds. No wheezing.  Musculoskeletal:     Comments: Moderate tenderness of midline spine at approximate level of T11.  Skin:    General: Skin is warm and dry.  Neurological:     Mental  Status: She is alert and oriented to person, place, and time.  Psychiatric:        Mood and Affect: Mood normal.        Behavior: Behavior normal.        Thought Content: Thought content normal.        Judgment: Judgment normal.      MD Evaluation Airway: WNL Heart: WNL Abdomen: WNL Chest/ Lungs: WNL ASA  Classification: 3 Mallampati/Airway Score: One   Imaging: Ct Chest W Contrast  Result Date: 03/20/2018 CLINICAL DATA:  73 year old female with history of poorly differentiated squamous cell carcinoma of the lung status post chemotherapy and radiation therapy. EXAM: CT CHEST WITH CONTRAST TECHNIQUE: Multidetector CT imaging of the chest was performed during intravenous contrast administration. CONTRAST:  17mL OMNIPAQUE IOHEXOL 300 MG/ML  SOLN COMPARISON:  Chest CT 09/16/2017. FINDINGS: Cardiovascular: Heart size is normal. There is no significant pericardial fluid, thickening or pericardial calcification. There is aortic atherosclerosis, as well as atherosclerosis of the great vessels of the mediastinum and the coronary arteries, including calcified atherosclerotic plaque in the left anterior descending coronary artery. Mediastinum/Nodes: No pathologically enlarged mediastinal or hilar lymph nodes. Esophagus is unremarkable in appearance. No axillary lymphadenopathy. Lungs/Pleura: Previously noted mass-like area of architectural distortion in the medial aspect of the left lower lobe has significantly decreased in size compared to the prior study, most compatible with an area of postradiation mass-like fibrosis. Scattered surrounding areas of septal thickening, regional architectural distortion and nodularity are also noted in adjacent areas of both the left upper and left lower lobes, also compatible with evolving postradiation changes. 7 mm nodule in the superior segment of the right lower lobe (axial image 61 of series 7) is stable dating back to 07/13/2017, potentially benign. No other  definite suspicious appearing pulmonary nodules or masses are noted. Diffuse bronchial wall thickening with moderate centrilobular and paraseptal emphysema. Trace left pleural effusion. Upper Abdomen: Small calcified granulomas in the right lobe of the liver. Musculoskeletal: Chronic compression fractures of T9 and T10, similar to the prior examination with post vertebroplasty changes at both levels. There are no aggressive appearing lytic or blastic lesions noted in the visualized portions of the skeleton. IMPRESSION: 1. Positive response to  therapy with decreased size of treated lesion in the left lower lobe with evolving postradiation changes in the left lung, as above. 2. Trace left pleural effusion. 3. Stable 7 mm nodule in the superior segment of the right lower lobe, favored to be benign. Continued attention on follow-up studies is recommended. 4. Aortic atherosclerosis, in addition to left anterior descending coronary artery disease. Assessment for potential risk factor modification, dietary therapy or pharmacologic therapy may be warranted, if clinically indicated. 5. Diffuse bronchial wall thickening with moderate centrilobular and paraseptal emphysema; imaging findings suggestive of underlying COPD. Aortic Atherosclerosis (ICD10-I70.0) and Emphysema (ICD10-J43.9). Electronically Signed   By: Vinnie Langton M.D.   On: 03/20/2018 09:36   Ct Thoracic Spine Wo Contrast  Addendum Date: 03/07/2018   ADDENDUM REPORT: 03/07/2018 13:56 ADDENDUM: I reviewed this study with Dr. Vernard Gambles. In retrospect there is mild convexity of the superior endplate of S92 when compared to prior thoracic spine MRI of 02/26/2018 and thoracic spine CT scan from 11/01/2017. This would suggest a new/acute superior endplate fracture. Electronically Signed   By: Marijo Sanes M.D.   On: 03/07/2018 13:56   Result Date: 03/07/2018 CLINICAL DATA:  Recent T10 kyphoplasty 1 week ago. Persistent back pain. EXAM: CT THORACIC SPINE WITHOUT  CONTRAST TECHNIQUE: Multidetector CT images of the thoracic were obtained using the standard protocol without intravenous contrast. COMPARISON:  MRI thoracic spine 02/26/2018 FINDINGS: Alignment: Normal Vertebrae: Vertebral augmentation changes noted at T9 and T10. No complicating features are demonstrated. No extrusion of bone cement is demonstrated. No further collapse of the vertebral bodies. No new compression fractures are identified. No retropulsion or canal stenosis. Paraspinal and other soft tissues: Stable severe emphysematous changes and left lower lobe pulmonary mass/radiation change. There is also a persistent small left effusion. Disc levels: The spinal canal is generous. No obvious thoracic disc protrusions, spinal or foraminal stenosis. IMPRESSION: 1. Vertebral augmentation changes at T9 and Z30 without complicating features. 2. No new/acute thoracic spine compression fractures are identified. 3. Generous spinal canal without evidence for large disc protrusions, spinal or foraminal stenosis. 4. Chronic lung changes as detailed above. Electronically Signed: By: Marijo Sanes M.D. On: 03/07/2018 10:23   Mr Thoracic Spine W Wo Contrast  Result Date: 02/27/2018 CLINICAL DATA:  History of bending over to pick something up, now with severe back pain. History of T9 vertebral augmentation for benign osteopenic compression fracture. Abnormal plain films show early compression at T10. EXAM: MRI THORACIC AND LUMBAR SPINE WITHOUT AND WITH CONTRAST TECHNIQUE: Multiplanar and multiecho pulse sequences of the thoracic and lumbar spine were obtained without and with intravenous contrast. CONTRAST:  Gadavist 3 mL COMPARISON:  CT chest 11/01/2017.  Chest radiograph 01/25/2018. FINDINGS: MRI THORACIC SPINE FINDINGS Alignment:  Physiologic. Vertebrae: Treated T9 compression fracture, status post kyphoplasty, mild residual bone marrow edema, preserved vertebral body height. New T10 compression fracture, loss of  approximately 25% vertebral body height, superior endplate depression, diffuse bone marrow edema, no definite osseous destruction, suspected to represent adjacent segment posttraumatic benign osteopenic phenomenon. Mild postcontrast enhancement of T10, slight postcontrast enhancement of T9. No significant retropulsion. No findings concerning for epidural tumor or epidural hematoma. Cord:  Normal signal and morphology. Paraspinal and other soft tissues: LEFT pleural effusion/pleural thickening. COPD. Disc levels: No disc protrusion or epidural tumor. MRI LUMBAR SPINE FINDINGS Segmentation:  Standard. Alignment:  Anatomic. Vertebrae: No worrisome osseous lesion. No postcontrast enhancement. Conus medullaris: Extends to the L1-L2 level and appears normal. Paraspinal and other soft tissues: Unremarkable. Disc  levels: No compressive disc protrusion or spinal stenosis. IMPRESSION: Status post T9 kyphoplasty, for benign osteopenic compression fracture, with preserved vertebral body height. New T10 compression fracture, slightly progressed from plain films, slight anterior wedging, consistent with an adjacent segment injury. Vertebral augmentation at T10 may be warranted. Although there are no worrisome imaging features, tissue sampling could be performed as clinically indicated. No features worrisome for epidural tumor or epidural hematoma. Normal cord signal and morphology throughout. Unremarkable lumbar spine MRI. No compression deformities. No features worrisome for metastatic disease to the lumbosacral spine. Electronically Signed   By: Staci Righter M.D.   On: 02/27/2018 11:25   Mr Lumbar Spine W Wo Contrast  Result Date: 02/27/2018 CLINICAL DATA:  History of bending over to pick something up, now with severe back pain. History of T9 vertebral augmentation for benign osteopenic compression fracture. Abnormal plain films show early compression at T10. EXAM: MRI THORACIC AND LUMBAR SPINE WITHOUT AND WITH CONTRAST  TECHNIQUE: Multiplanar and multiecho pulse sequences of the thoracic and lumbar spine were obtained without and with intravenous contrast. CONTRAST:  Gadavist 3 mL COMPARISON:  CT chest 11/01/2017.  Chest radiograph 01/25/2018. FINDINGS: MRI THORACIC SPINE FINDINGS Alignment:  Physiologic. Vertebrae: Treated T9 compression fracture, status post kyphoplasty, mild residual bone marrow edema, preserved vertebral body height. New T10 compression fracture, loss of approximately 25% vertebral body height, superior endplate depression, diffuse bone marrow edema, no definite osseous destruction, suspected to represent adjacent segment posttraumatic benign osteopenic phenomenon. Mild postcontrast enhancement of T10, slight postcontrast enhancement of T9. No significant retropulsion. No findings concerning for epidural tumor or epidural hematoma. Cord:  Normal signal and morphology. Paraspinal and other soft tissues: LEFT pleural effusion/pleural thickening. COPD. Disc levels: No disc protrusion or epidural tumor. MRI LUMBAR SPINE FINDINGS Segmentation:  Standard. Alignment:  Anatomic. Vertebrae: No worrisome osseous lesion. No postcontrast enhancement. Conus medullaris: Extends to the L1-L2 level and appears normal. Paraspinal and other soft tissues: Unremarkable. Disc levels: No compressive disc protrusion or spinal stenosis. IMPRESSION: Status post T9 kyphoplasty, for benign osteopenic compression fracture, with preserved vertebral body height. New T10 compression fracture, slightly progressed from plain films, slight anterior wedging, consistent with an adjacent segment injury. Vertebral augmentation at T10 may be warranted. Although there are no worrisome imaging features, tissue sampling could be performed as clinically indicated. No features worrisome for epidural tumor or epidural hematoma. Normal cord signal and morphology throughout. Unremarkable lumbar spine MRI. No compression deformities. No features worrisome  for metastatic disease to the lumbosacral spine. Electronically Signed   By: Staci Righter M.D.   On: 02/27/2018 11:25   Ir Kypho Thoracic With Bone Biopsy  Result Date: 03/01/2018 CLINICAL DATA:  History of lung carcinoma stage III. Previous T9 painful vertebral compression fracture in November 2019, with good response to percutaneous kyphoplasty. She presented recently with new midthoracic pain. Imaging demonstrated a benign-appearing subacute T10 compression fracture. EXAM: KYPHOPLASTY AT THORACIC T10 DEEP BONE T10 VERTEBRAL BODY CORE BIOPSY TECHNIQUE: The procedure, risks (including but not limited to bleeding, infection, organ damage), benefits, and alternatives were explained to the patient. Questions regarding the procedure were encouraged and answered. The patient understands and consents to the procedure. The patient was placed prone on the fluoroscopic table. The skin overlying the lower thoracic region was then prepped and draped in the usual sterile fashion. Maximal barrier sterile technique was utilized including caps, mask, sterile gowns, sterile gloves, sterile drape, hand hygiene and skin antiseptic. Intravenous Fentanyl and Versed were administered as conscious  sedation during continuous cardiorespiratory monitoring by the radiology RN, with a total moderate sedation time of26 minutes. As antibiotic prophylaxis, Cipro 900 mg was ordered pre-procedure and administered intravenously within !one hour! of incision. The right pedicle at thoracic T10 was then infiltrated with 1% lidocaine followed by the advancement of a Kyphon trocar needle through the right pedicle into the posterior one-third. The trocar was removed and coaxial core bone biopsy was obtained. Subsequently the osteo drill was advanced to the anterior third of the vertebral body. The osteo drill was retracted. Through the working cannula, a Kyphon inflatable bone tamp 15 x 2 was advanced and positioned with the distal marker 5 mm from  the anterior aspect of the cortex. Crossing of the midline was seen on the AP projection. At this time, the balloon was expanded using contrast via a Kyphon inflation syringe device via micro tubing. Good positioning was seen on AP and lateral imaging. Inflation continued until there was near apposition across the midline and towards the superior and the inferior end plates. At this time, methylmethacrylate mixture was reconstituted in the Kyphon bone mixing device system. This was then loaded into the delivery mechanism, attached to Kyphon cement delivery system The balloon was deflated and removed followed by the instillation of methylmethacrylate mixture with excellent filling in the AP and lateral projections. No extravasation was noted in the disk spaces or posteriorly into the spinal canal. No epidural venous contamination was seen. The patient tolerated the procedure well. There were no acute complications. The working cannulae and the bone filler were then retrieved and removed. COMPLICATIONS: COMPLICATIONS None immediate. IMPRESSION: 1. Status post vertebral body augmentation using balloon kyphoplasty at thoracic T10 as described without event. 2. Deep core bone biopsy of T10 vertebral body. 3. Per CMS PQRS reporting requirements (PQRS Measure 24): Given the patient's age of greater than 53 and the fracture site (hip, distal radius, or spine), the patient should be tested for osteoporosis using DXA, and the appropriate treatment considered based on the DXA results. Electronically Signed   By: Lucrezia Europe M.D.   On: 03/01/2018 14:33   Ir Radiologist Eval & Mgmt  Result Date: 03/13/2018 Please refer to notes tab for details about interventional procedure. (Op Note)  Ir Radiologist Eval & Mgmt  Result Date: 02/26/2018 Please refer to notes tab for details about interventional procedure. (Op Note)   Labs:  CBC: Recent Labs    03/01/18 1029 03/06/18 0808 03/21/18 0900 03/26/18 0844  WBC 5.3 5.3  4.7 4.9  HGB 12.1 11.7* 11.7* 12.5  HCT 39.7 37.7 38.8 42.5  PLT 333 346 320 323    COAGS: Recent Labs    08/31/17 1142 11/27/17 1231 03/01/18 1029 03/26/18 0844  INR 1.04 0.85 0.94 1.0  APTT 32  --   --   --     BMP: Recent Labs    02/21/18 1127 03/01/18 1029 03/06/18 0808 03/21/18 0900  NA 140 140 139 141  K 3.7 3.4* 3.6 3.9  CL 102 104 102 107  CO2 29 29 29 24   GLUCOSE 87 89 89 85  BUN 13 8 11 10   CALCIUM 9.4 9.3 9.4 8.9  CREATININE 0.75 0.71 0.71 0.72  GFRNONAA >60 >60 >60 >60  GFRAA >60 >60 >60 >60    LIVER FUNCTION TESTS: Recent Labs    01/25/18 1126 02/21/18 1127 03/06/18 0808 03/21/18 0900  BILITOT 0.3 0.3 0.4 0.4  AST 13* 18 16 16   ALT 7 7 8 7   ALKPHOS  109 110 115 110  PROT 7.1 7.2 7.3 6.9  ALBUMIN 3.3* 3.5 3.5 3.3*     Assessment and Plan:  T11 compression fracture. Plan for image-guided T11 kyphoplasty/vertebroplasty with possible bone biopsy today with Dr. Vernard Gambles. Patient is NPO. Afebrile and WBCs WNL. She does not take blood thinners. INR 1.0 seconds today.  Risks and benefits of T11 kyphoplasty/vertebroplasty were discussed with the patient including, but not limited to education regarding the natural healing process of compression fractures without intervention, bleeding, infection, cement migration which may cause spinal cord damage, paralysis, pulmonary embolism or even death. This interventional procedure involves the use of X-rays and because of the nature of the planned procedure, it is possible that we will have prolonged use of X-ray fluoroscopy. Potential radiation risks to you include (but are not limited to) the following: - A slightly elevated risk for cancer  several years later in life. This risk is typically less than 0.5% percent. This risk is low in comparison to the normal incidence of human cancer, which is 33% for women and 50% for men according to the [employer]. - Radiation induced injury can include  skin redness, resembling a rash, tissue breakdown / ulcers and hair loss (which can be temporary or permanent).  The likelihood of either of these occurring depends on the difficulty of the procedure and whether you are sensitive to radiation due to previous procedures, disease, or genetic conditions.  IF your procedure requires a prolonged use of radiation, you will be notified and given written instructions for further action.  It is your responsibility to monitor the irradiated area for the 2 weeks following the procedure and to notify your physician if you are concerned that you have suffered a radiation induced injury.   All of the patient's questions were answered, patient is agreeable to proceed. Consent signed and in chart.   Thank you for this interesting consult.  I greatly enjoyed meeting Sherry [name] and look forward to participating in their care.  A copy of this report was sent to the requesting provider on this date.  Electronically Signed: Earley Abide, PA-C 03/26/2018, 9:49 AM   I spent a total of 25 Minutes in face to face in clinical consultation, greater than 50% of which was counseling/coordinating care for T11 compression fracture.

## 2018-03-26 NOTE — Procedures (Signed)
  Procedure: Thoracic T11 biopsy and kyphoplasty   EBL:   minimal Complications:  none immediate  See full dictation in [facility].  Dillard Cannon MD Main # [phone] Pager  [phone]

## 2018-03-26 NOTE — Sedation Documentation (Signed)
Patient is resting comfortably. 

## 2018-04-03 ENCOUNTER — Encounter: Payer: Self-pay | Admitting: Internal Medicine

## 2018-04-03 ENCOUNTER — Inpatient Hospital Stay: Payer: Medicare Other

## 2018-04-03 ENCOUNTER — Other Ambulatory Visit: Payer: Self-pay

## 2018-04-03 ENCOUNTER — Inpatient Hospital Stay (HOSPITAL_BASED_OUTPATIENT_CLINIC_OR_DEPARTMENT_OTHER): Payer: Medicare Other | Admitting: Internal Medicine

## 2018-04-03 VITALS — BP 115/61 | HR 72 | Temp 98.1°F | Resp 18 | Ht 61.0 in | Wt 74.8 lb

## 2018-04-03 DIAGNOSIS — C3492 Malignant neoplasm of unspecified part of left bronchus or lung: Secondary | ICD-10-CM

## 2018-04-03 DIAGNOSIS — J449 Chronic obstructive pulmonary disease, unspecified: Secondary | ICD-10-CM

## 2018-04-03 DIAGNOSIS — Z5112 Encounter for antineoplastic immunotherapy: Secondary | ICD-10-CM | POA: Diagnosis not present

## 2018-04-03 DIAGNOSIS — C3432 Malignant neoplasm of lower lobe, left bronchus or lung: Secondary | ICD-10-CM | POA: Diagnosis not present

## 2018-04-03 DIAGNOSIS — Z79899 Other long term (current) drug therapy: Secondary | ICD-10-CM | POA: Diagnosis not present

## 2018-04-03 LAB — CBC WITH DIFFERENTIAL (CANCER CENTER ONLY)
Abs Immature Granulocytes: 0.01 10*3/uL (ref 0.00–0.07)
Basophils Absolute: 0 10*3/uL (ref 0.0–0.1)
Basophils Relative: 1 %
Eosinophils Absolute: 0.1 10*3/uL (ref 0.0–0.5)
Eosinophils Relative: 2 %
HEMATOCRIT: 38.8 % (ref 36.0–46.0)
Hemoglobin: 11.8 g/dL — ABNORMAL LOW (ref 12.0–15.0)
IMMATURE GRANULOCYTES: 0 %
Lymphocytes Relative: 20 %
Lymphs Abs: 1 10*3/uL (ref 0.7–4.0)
MCH: 27.6 pg (ref 26.0–34.0)
MCHC: 30.4 g/dL (ref 30.0–36.0)
MCV: 90.9 fL (ref 80.0–100.0)
Monocytes Absolute: 0.6 10*3/uL (ref 0.1–1.0)
Monocytes Relative: 12 %
NEUTROS PCT: 65 %
Neutro Abs: 3.3 10*3/uL (ref 1.7–7.7)
Platelet Count: 334 10*3/uL (ref 150–400)
RBC: 4.27 MIL/uL (ref 3.87–5.11)
RDW: 17.1 % — ABNORMAL HIGH (ref 11.5–15.5)
WBC: 5 10*3/uL (ref 4.0–10.5)
nRBC: 0 % (ref 0.0–0.2)

## 2018-04-03 LAB — CMP (CANCER CENTER ONLY)
ALT: 8 U/L (ref 0–44)
AST: 16 U/L (ref 15–41)
Albumin: 3.8 g/dL (ref 3.5–5.0)
Alkaline Phosphatase: 116 U/L (ref 38–126)
Anion gap: 10 (ref 5–15)
BUN: 13 mg/dL (ref 8–23)
CO2: 28 mmol/L (ref 22–32)
Calcium: 9.2 mg/dL (ref 8.9–10.3)
Chloride: 104 mmol/L (ref 98–111)
Creatinine: 0.71 mg/dL (ref 0.44–1.00)
GFR, Est AFR Am: >60 mL/min (ref >60)
Glucose, Bld: 71 mg/dL (ref 70–99)
Potassium: 3.8 mmol/L (ref 3.5–5.1)
Sodium: 142 mmol/L (ref 135–145)
Total Bilirubin: 0.4 mg/dL (ref 0.3–1.2)
Total Protein: 7.6 g/dL (ref 6.5–8.1)

## 2018-04-03 MED ORDER — SODIUM CHLORIDE 0.9 % IV SOLN
Freq: Once | INTRAVENOUS | Status: AC
  Administered 2018-04-03: 13:00:00 via INTRAVENOUS
  Filled 2018-04-03: qty 250

## 2018-04-03 MED ORDER — SODIUM CHLORIDE 0.9 % IV SOLN
10.5000 mg/kg | Freq: Once | INTRAVENOUS | Status: AC
  Administered 2018-04-03: 360 mg via INTRAVENOUS
  Filled 2018-04-03: qty 7.2

## 2018-04-03 NOTE — Patient Instructions (Signed)
[facility] Discharge Instructions for Patients Receiving Chemotherapy  Today you received the following chemotherapy agents: Imfinzi.  To help prevent nausea and vomiting after your treatment, we encourage you to take your nausea medication as directed.   If you develop nausea and vomiting that is not controlled by your nausea medication, call the clinic.   BELOW ARE SYMPTOMS THAT SHOULD BE REPORTED IMMEDIATELY:  *FEVER GREATER THAN 100.5 F  *CHILLS WITH OR WITHOUT FEVER  NAUSEA AND VOMITING THAT IS NOT CONTROLLED WITH YOUR NAUSEA MEDICATION  *UNUSUAL SHORTNESS OF BREATH  *UNUSUAL BRUISING OR BLEEDING  TENDERNESS IN MOUTH AND THROAT WITH OR WITHOUT PRESENCE OF ULCERS  *URINARY PROBLEMS  *BOWEL PROBLEMS  UNUSUAL RASH Items with * indicate a potential emergency and should be followed up as soon as possible.  Feel free to call the clinic should you have any questions or concerns. The clinic phone number is [phone].  Please show the CHEMO ALERT CARD at check-in to the Emergency Department and triage nurse.   

## 2018-04-03 NOTE — Progress Notes (Signed)
[facility] [phone]   [phone]  OFFICE PROGRESS NOTE  [name], NP [place] [address]  DIAGNOSIS: Stage IIb/IIIa (T3, N0/N1, M0) non-small cell lung cancer poorly differentiated squamous cell carcinoma diagnosed in August 2019.  The patient presented with large superior segment left lower lobe mass with questionable left hilar adenopathy.  PRIOR THERAPY: Concurrent chemoradiation with weekly carboplatin for AUC of 2 and paclitaxel 45 mg/M2.  Status post 7 cycles.  Last dose was given October 29, 2017 with partial response.  CURRENT THERAPY: Consolidation treatment with immunotherapy with Imfinzi 10 mg/KG every 2 weeks to start December 13, 2017.  Status post 8 cycles.  INTERVAL HISTORY: Sherry [name] 73 y.o. female returns to the clinic today for follow-up visit.  The patient is feeling fine today with no concerning complaints except for the low back pain.  She underwent another vertebroplasty recently but no significant improvement.  She denied having any chest pain, cough or hemoptysis.  She denied having any recent weight loss or night sweats.  She has no nausea, vomiting, diarrhea or constipation.  She continues to tolerate her treatment with Imfinzi fairly well.  She is here for evaluation before starting cycle #9.   MEDICAL HISTORY: Past Medical History:  Diagnosis Date   Allergy    Anxiety    Arthritis    Cavitating mass in left lower lung lobe    Complication of anesthesia    woke up during procedure   COPD (chronic obstructive pulmonary disease) (HCC)    Depression    Family history of adverse reaction to anesthesia    " my mother was always hard to wake up"   Full dentures    Headache    Neuromuscular disorder ([facility])    Pneumonia    Thoracic spine fracture ([facility]) 11/01/2017   T9   Wears glasses     ALLERGIES:  is allergic to penicillins.  MEDICATIONS:  Current Outpatient  Medications  Medication Sig Dispense Refill   acetaminophen (TYLENOL 8 HOUR) 650 MG CR tablet Take 1 tablet (650 mg total) by mouth every 8 (eight) hours as needed for pain or fever. 30 tablet 0   alendronate (FOSAMAX) 70 MG tablet Take 70 mg by mouth once a week.     cyanocobalamin (,VITAMIN B-12,) 1000 MCG/ML injection Inject 1,000 mcg into the muscle every 30 (thirty) days. Administered by Dr. Eldridge Abrahams  - every other month (per pt)     gabapentin (NEURONTIN) 100 MG capsule Take 1 capsule (100 mg total) by mouth 2 (two) times daily. Take 1 tablet once a day (Patient taking differently: Take 100 mg by mouth daily. ) 30 capsule 0   guaifenesin (ROBITUSSIN) 100 MG/5ML syrup Take 200 mg by mouth 3 (three) times daily as needed for cough.      Menthol-Ascorbic Acid (LUDENS COUGH DROPS MT) Use as directed 1 lozenge in the mouth or throat 3 (three) times daily as needed (cough).      Multiple Vitamin (MULTIVITAMIN WITH MINERALS) TABS tablet Take 1 tablet by mouth daily.     oxyCODONE-acetaminophen (PERCOCET/ROXICET) 5-325 MG tablet Take 1 tablet by mouth every 8 (eight) hours as needed for severe pain. 20 tablet 0   No current facility-administered medications for this visit.     SURGICAL HISTORY:  Past Surgical History:  Procedure Laterality Date   ELBOW SURGERY     for  "Tennis Elbow"  hand surgery     IR KYPHO THORACIC WITH BONE BIOPSY  11/27/2017   IR KYPHO THORACIC WITH BONE BIOPSY  03/01/2018   IR KYPHO THORACIC WITH BONE BIOPSY  03/26/2018   IR RADIOLOGIST EVAL & MGMT  11/20/2017   IR RADIOLOGIST EVAL & MGMT  02/26/2018   IR RADIOLOGIST EVAL & MGMT  03/13/2018   MULTIPLE TOOTH EXTRACTIONS     TUBAL LIGATION     VIDEO BRONCHOSCOPY N/A 08/31/2017   Procedure: VIDEO BRONCHOSCOPY;  Surgeon: Melrose Nakayama, MD;  Location: [facility];  Service: Thoracic;  Laterality: N/A;    REVIEW OF SYSTEMS:  A comprehensive review of systems was negative except for: Musculoskeletal:  positive for back pain   PHYSICAL EXAMINATION: General appearance: alert, cooperative and no distress Head: Normocephalic, without obvious abnormality, atraumatic Neck: no adenopathy, no JVD, supple, symmetrical, trachea midline and thyroid not enlarged, symmetric, no tenderness/mass/nodules Lymph nodes: Cervical, supraclavicular, and axillary nodes normal. Resp: clear to auscultation bilaterally Back: symmetric, no curvature. ROM normal. No CVA tenderness. Cardio: regular rate and rhythm, S1, S2 normal, no murmur, click, rub or gallop GI: soft, non-tender; bowel sounds normal; no masses,  no organomegaly Extremities: extremities normal, atraumatic, no cyanosis or edema  ECOG PERFORMANCE STATUS: 1 - Symptomatic but completely ambulatory  Blood pressure 115/61, pulse 72, temperature 98.1 F (36.7 C), temperature source Oral, resp. rate 18, height 5\' 1"  (1.549 m), weight 74 lb 12.8 oz (33.9 kg), SpO2 93 %.  LABORATORY DATA: Lab Results  Component Value Date   WBC 5.0 04/03/2018   HGB 11.8 (L) 04/03/2018   HCT 38.8 04/03/2018   MCV 90.9 04/03/2018   PLT 334 04/03/2018      Chemistry      Component Value Date/Time   NA 138 03/26/2018 0844   K 3.3 (L) 03/26/2018 0844   CL 103 03/26/2018 0844   CO2 27 03/26/2018 0844   BUN 12 03/26/2018 0844   CREATININE 0.75 03/26/2018 0844   CREATININE 0.72 03/21/2018 0900      Component Value Date/Time   CALCIUM 9.1 03/26/2018 0844   ALKPHOS 110 03/21/2018 0900   AST 16 03/21/2018 0900   ALT 7 03/21/2018 0900   BILITOT 0.4 03/21/2018 0900       RADIOGRAPHIC STUDIES: Ct Chest W Contrast  Result Date: 03/20/2018 CLINICAL DATA:  73 year old female with history of poorly differentiated squamous cell carcinoma of the lung status post chemotherapy and radiation therapy. EXAM: CT CHEST WITH CONTRAST TECHNIQUE: Multidetector CT imaging of the chest was performed during intravenous contrast administration. CONTRAST:  67mL OMNIPAQUE IOHEXOL 300  MG/ML  SOLN COMPARISON:  Chest CT 09/16/2017. FINDINGS: Cardiovascular: Heart size is normal. There is no significant pericardial fluid, thickening or pericardial calcification. There is aortic atherosclerosis, as well as atherosclerosis of the great vessels of the mediastinum and the coronary arteries, including calcified atherosclerotic plaque in the left anterior descending coronary artery. Mediastinum/Nodes: No pathologically enlarged mediastinal or hilar lymph nodes. Esophagus is unremarkable in appearance. No axillary lymphadenopathy. Lungs/Pleura: Previously noted mass-like area of architectural distortion in the medial aspect of the left lower lobe has significantly decreased in size compared to the prior study, most compatible with an area of postradiation mass-like fibrosis. Scattered surrounding areas of septal thickening, regional architectural distortion and nodularity are also noted in adjacent areas of both the left upper and left lower lobes, also compatible with evolving postradiation changes. 7 mm nodule in the superior segment of the right lower lobe (axial image 61  of series 7) is stable dating back to 07/13/2017, potentially benign. No other definite suspicious appearing pulmonary nodules or masses are noted. Diffuse bronchial wall thickening with moderate centrilobular and paraseptal emphysema. Trace left pleural effusion. Upper Abdomen: Small calcified granulomas in the right lobe of the liver. Musculoskeletal: Chronic compression fractures of T9 and T10, similar to the prior examination with post vertebroplasty changes at both levels. There are no aggressive appearing lytic or blastic lesions noted in the visualized portions of the skeleton. IMPRESSION: 1. Positive response to therapy with decreased size of treated lesion in the left lower lobe with evolving postradiation changes in the left lung, as above. 2. Trace left pleural effusion. 3. Stable 7 mm nodule in the superior segment of the  right lower lobe, favored to be benign. Continued attention on follow-up studies is recommended. 4. Aortic atherosclerosis, in addition to left anterior descending coronary artery disease. Assessment for potential risk factor modification, dietary therapy or pharmacologic therapy may be warranted, if clinically indicated. 5. Diffuse bronchial wall thickening with moderate centrilobular and paraseptal emphysema; imaging findings suggestive of underlying COPD. Aortic Atherosclerosis (ICD10-I70.0) and Emphysema (ICD10-J43.9). Electronically Signed   By: Vinnie Langton M.D.   On: 03/20/2018 09:36   Ct Thoracic Spine Wo Contrast  Addendum Date: 03/07/2018   ADDENDUM REPORT: 03/07/2018 13:56 ADDENDUM: I reviewed this study with Dr. Vernard Gambles. In retrospect there is mild convexity of the superior endplate of L38 when compared to prior thoracic spine MRI of 02/26/2018 and thoracic spine CT scan from 11/01/2017. This would suggest a new/acute superior endplate fracture. Electronically Signed   By: Marijo Sanes M.D.   On: 03/07/2018 13:56   Result Date: 03/07/2018 CLINICAL DATA:  Recent T10 kyphoplasty 1 week ago. Persistent back pain. EXAM: CT THORACIC SPINE WITHOUT CONTRAST TECHNIQUE: Multidetector CT images of the thoracic were obtained using the standard protocol without intravenous contrast. COMPARISON:  MRI thoracic spine 02/26/2018 FINDINGS: Alignment: Normal Vertebrae: Vertebral augmentation changes noted at T9 and T10. No complicating features are demonstrated. No extrusion of bone cement is demonstrated. No further collapse of the vertebral bodies. No new compression fractures are identified. No retropulsion or canal stenosis. Paraspinal and other soft tissues: Stable severe emphysematous changes and left lower lobe pulmonary mass/radiation change. There is also a persistent small left effusion. Disc levels: The spinal canal is generous. No obvious thoracic disc protrusions, spinal or foraminal stenosis.  IMPRESSION: 1. Vertebral augmentation changes at T9 and B01 without complicating features. 2. No new/acute thoracic spine compression fractures are identified. 3. Generous spinal canal without evidence for large disc protrusions, spinal or foraminal stenosis. 4. Chronic lung changes as detailed above. Electronically Signed: By: Marijo Sanes M.D. On: 03/07/2018 10:23   Ir Kypho Thoracic With Bone Biopsy  Result Date: 03/26/2018 CLINICAL DATA:  Subacute painful thoracic T11 compression fracture deformity EXAM: KYPHOPLASTY AT THORACIC T11 FLUOROSCOPICALLY GUIDED DEEP CORE BONE BIOPSY T11 VERTEBRAL BODY TECHNIQUE: The procedure, risks (including but not limited to bleeding, infection, organ damage), benefits, and alternatives were explained to the patient. Questions regarding the procedure were encouraged and answered. The patient understands and consents to the procedure. The patient was placed prone on the fluoroscopic table. The skin overlying the lower thoracic region was then prepped and draped in the usual sterile fashion. Maximal barrier sterile technique was utilized including caps, mask, sterile gowns, sterile gloves, sterile drape, hand hygiene and skin antiseptic. Intravenous Fentanyl 180mcg and Versed 2mg  were administered as conscious sedation during continuous monitoring of the patient's  level of consciousness and physiological / cardiorespiratory status by the radiology RN, with a total moderate sedation time of 30 minutes. As antibiotic prophylaxis, vancomycin 1 g was ordered pre-procedure and administered intravenously within !one hour! of incision. The right pedicle at thoracic T11 was then infiltrated with 1% lidocaine followed by the advancement of a Kyphon trocar needle through the right pedicle into the posterior one-third. The trocar was removed and coaxial bone biopsy obtained. The osteo drill was advanced to the anterior third of the vertebral body. The osteo drill was retracted. Through  the working cannula, a Kyphon inflatable bone tamp 15 x 2 was advanced and positioned with the distal marker 5 mm from the anterior aspect of the cortex. Crossing of the midline was not seen on the AP projection. At this time, the balloon was expanded using contrast via a Kyphon inflation syringe device via micro tubing. In similar fashion, the left pedicle was infiltrated with 1% lidocaine followed by the advancement of a second Kyphon trocar needle through the left pedicle into the posterior third of the vertebral body. Coaxial bone biopsy sample obtained. The osteo drill was coaxially advanced to the anterior right third. The osteo drill was exchanged for a Kyphon inflatable bone tamp 15 x 2, advanced to the 5 mm of the anterior aspect of the cortex. The balloon was then expanded using contrast as above. Inflations were continued until there was near apposition across the midline and with the superior and the inferior end plates. At this time, methylmethacrylate mixture was reconstituted in the Kyphon bone mixing device system. This was then loaded into the delivery mechanism, attached to Kyphon bone fillers. The balloons were deflated and removed followed by the instillation of methylmethacrylate mixture with excellent filling in the AP and lateral projections. No extravasation was noted in the disk spaces or posteriorly into the spinal canal. No epidural venous contamination was seen. The patient tolerated the procedure well. There were no acute complications. The working cannulae and the bone filler were then retrieved and removed. COMPLICATIONS: COMPLICATIONS None immediate. IMPRESSION: 1. Status post vertebral body augmentation using balloon kyphoplasty at thoracic T11 as described without event. 2. Technically fluoroscopically guided successful deep core bone biopsy, T11 vertebral body 3. Per CMS PQRS reporting requirements (PQRS Measure 24): Given the patient's age of greater than 52 and the fracture site  (hip, distal radius, or spine), the patient should be tested for osteoporosis using DXA, and the appropriate treatment considered based on the DXA results. Electronically Signed   By: Lucrezia Europe M.D.   On: 03/26/2018 19:39   Ir Radiologist Eval & Mgmt  Result Date: 03/13/2018 Please refer to notes tab for details about interventional procedure. (Op Note)   ASSESSMENT AND PLAN: This is a very pleasant 73 years old white female with a stage IIIa non-small cell lung cancer, poorly differentiated squamous cell carcinoma.   The patient underwent a course of concurrent chemoradiation with weekly carboplatin and paclitaxel status post 7 cycles with partial response. The patient is currently undergoing consolidation treatment with immunotherapy with Imfinzi status post 8 cycles.   She has been tolerating this treatment well with no concerning adverse effects. I recommended for the patient to proceed with cycle #9 today as scheduled. She will come back for follow-up visit in 2 weeks for evaluation before starting the next cycle of her treatment. The patient was advised to call immediately if she has any concerning symptoms in the interval. The patient voices understanding of current disease  status and treatment options and is in agreement with the current care plan. All questions were answered. The patient knows to call the clinic with any problems, questions or concerns. We can certainly see the patient much sooner if necessary.  I spent 10 minutes counseling the patient face to face. The total time spent in the appointment was 15 minutes.  Disclaimer: This note was dictated with voice recognition software. Similar sounding words can inadvertently be transcribed and may not be corrected upon review.

## 2018-04-18 ENCOUNTER — Inpatient Hospital Stay: Payer: Medicare Other | Attending: Nurse Practitioner

## 2018-04-18 ENCOUNTER — Other Ambulatory Visit: Payer: Self-pay

## 2018-04-18 ENCOUNTER — Inpatient Hospital Stay (HOSPITAL_BASED_OUTPATIENT_CLINIC_OR_DEPARTMENT_OTHER): Payer: Medicare Other | Admitting: Physician Assistant

## 2018-04-18 ENCOUNTER — Encounter: Payer: Self-pay | Admitting: Physician Assistant

## 2018-04-18 ENCOUNTER — Inpatient Hospital Stay: Payer: Medicare Other

## 2018-04-18 VITALS — BP 112/64 | HR 90 | Temp 98.8°F | Resp 18 | Ht 61.0 in | Wt 71.7 lb

## 2018-04-18 DIAGNOSIS — M818 Other osteoporosis without current pathological fracture: Secondary | ICD-10-CM

## 2018-04-18 DIAGNOSIS — R110 Nausea: Secondary | ICD-10-CM

## 2018-04-18 DIAGNOSIS — M549 Dorsalgia, unspecified: Secondary | ICD-10-CM | POA: Diagnosis not present

## 2018-04-18 DIAGNOSIS — Z79899 Other long term (current) drug therapy: Secondary | ICD-10-CM | POA: Insufficient documentation

## 2018-04-18 DIAGNOSIS — Z5112 Encounter for antineoplastic immunotherapy: Secondary | ICD-10-CM | POA: Diagnosis not present

## 2018-04-18 DIAGNOSIS — C3432 Malignant neoplasm of lower lobe, left bronchus or lung: Secondary | ICD-10-CM | POA: Insufficient documentation

## 2018-04-18 DIAGNOSIS — C3492 Malignant neoplasm of unspecified part of left bronchus or lung: Secondary | ICD-10-CM

## 2018-04-18 LAB — CBC WITH DIFFERENTIAL (CANCER CENTER ONLY)
Abs Immature Granulocytes: 0.01 10*3/uL (ref 0.00–0.07)
Basophils Absolute: 0.1 10*3/uL (ref 0.0–0.1)
Basophils Relative: 1 %
Eosinophils Absolute: 0.2 10*3/uL (ref 0.0–0.5)
Eosinophils Relative: 3 %
HCT: 39.9 % (ref 36.0–46.0)
Hemoglobin: 12.3 g/dL (ref 12.0–15.0)
Immature Granulocytes: 0 %
Lymphocytes Relative: 20 %
Lymphs Abs: 1.1 10*3/uL (ref 0.7–4.0)
MCH: 27.9 pg (ref 26.0–34.0)
MCHC: 30.8 g/dL (ref 30.0–36.0)
MCV: 90.5 fL (ref 80.0–100.0)
Monocytes Absolute: 0.7 10*3/uL (ref 0.1–1.0)
Monocytes Relative: 12 %
Neutro Abs: 3.6 10*3/uL (ref 1.7–7.7)
Neutrophils Relative %: 64 %
Platelet Count: 319 10*3/uL (ref 150–400)
RBC: 4.41 MIL/uL (ref 3.87–5.11)
RDW: 16.7 % — ABNORMAL HIGH (ref 11.5–15.5)
WBC Count: 5.6 10*3/uL (ref 4.0–10.5)
nRBC: 0 % (ref 0.0–0.2)

## 2018-04-18 LAB — CMP (CANCER CENTER ONLY)
ALT: 8 U/L (ref 0–44)
AST: 15 U/L (ref 15–41)
Albumin: 3.9 g/dL (ref 3.5–5.0)
Alkaline Phosphatase: 104 U/L (ref 38–126)
Anion gap: 12 (ref 5–15)
BUN: 15 mg/dL (ref 8–23)
CO2: 24 mmol/L (ref 22–32)
Calcium: 8.6 mg/dL — ABNORMAL LOW (ref 8.9–10.3)
Chloride: 102 mmol/L (ref 98–111)
Creatinine: 0.8 mg/dL (ref 0.44–1.00)
GFR, Est AFR Am: >60 mL/min (ref >60)
GFR, Estimated: >60 mL/min (ref >60)
Glucose, Bld: 106 mg/dL — ABNORMAL HIGH (ref 70–99)
Potassium: 3.9 mmol/L (ref 3.5–5.1)
Sodium: 138 mmol/L (ref 135–145)
Total Bilirubin: 0.4 mg/dL (ref 0.3–1.2)
Total Protein: 7.8 g/dL (ref 6.5–8.1)

## 2018-04-18 LAB — TSH: TSH: 2.62 u[IU]/mL (ref 0.308–3.960)

## 2018-04-18 MED ORDER — SODIUM CHLORIDE 0.9 % IV SOLN
Freq: Once | INTRAVENOUS | Status: AC
  Administered 2018-04-18: 15:00:00 via INTRAVENOUS
  Filled 2018-04-18: qty 250

## 2018-04-18 MED ORDER — PROCHLORPERAZINE MALEATE 10 MG PO TABS: 10.0000 mg | ORAL_TABLET | Freq: Four times a day (QID) | ORAL | 0 refills | Status: DC | PRN

## 2018-04-18 MED ORDER — SODIUM CHLORIDE 0.9 % IV SOLN
10.5000 mg/kg | Freq: Once | INTRAVENOUS | Status: AC
  Administered 2018-04-18: 360 mg via INTRAVENOUS
  Filled 2018-04-18: qty 7.2

## 2018-04-18 NOTE — Progress Notes (Signed)
[facility] OFFICE PROGRESS NOTE  [name], [place] [id number]  DIAGNOSIS: Stage IIb/IIIa (T3, N0/N1, M0)non-small cell lung cancer poorly differentiated squamous cell carcinoma diagnosed in August 2019. The patient presented with large superior segment leftlower lobe mass with questionable left hilar adenopathy.  PRIOR THERAPY: Concurrent chemoradiation with weekly carboplatin for AUC of 2 and paclitaxel 45 mg/M2.  Status post 7 cycles.  Last dose was given October 29, 2017 with partial response.  CURRENT THERAPY: Consolidation treatment with immunotherapy with Imfinzi 10 mg/KG every 2 weeks to start December 13, 2017.  Status post 8 cycles.  INTERVAL HISTORY: Sherry [name] 73 y.o. female returns to the clinic today for a follow-up visit.  The patient continues to experience significant back pain secondary to her compression fractures from her osteoporosis. The patient recently had a repeat kyphoplasty; however, she has not achieved pain relief.  She was given a prescription for Percocet every 8 hours, which helps alleviate some of her pain.  She experiences mild nausea from the pain medication.  She also endorses a 3lb weight loss over the last 2 weeks secondary to not eating from her pain.  Her osteoporosis is managed by her primary care provider.   She otherwise is feeling fairly well today.  She continues to tolerate treatment with Imfinzi fairly well without any adverse effects.  She denies any fevers, chills, or night sweats.  She denies any chest pain, shortness of breath, cough, or hemoptysis.  She denies any vomiting, diarrhea, or constipation.  She denies any headache or visual changes.  She denies any rashes or skin changes.  She is here today for evaluation prior to starting cycle #9 of her treatment.   MEDICAL HISTORY: Past Medical History:  Diagnosis Date  . Allergy   . Anxiety   . Arthritis   . Cavitating mass in left  lower lung lobe   . Complication of anesthesia    woke up during procedure  . COPD (chronic obstructive pulmonary disease) ([place])   . Depression   . Family history of adverse reaction to anesthesia    " my mother was always hard to wake up"  . Full dentures   . Headache   . Neuromuscular disorder ([place])   . Pneumonia   . Thoracic spine fracture ([place]) 11/01/2017   T9  . Wears glasses     ALLERGIES:  is allergic to penicillins.  MEDICATIONS:  Current Outpatient Medications  Medication Sig Dispense Refill  . acetaminophen (TYLENOL 8 HOUR) 650 MG CR tablet Take 1 tablet (650 mg total) by mouth every 8 (eight) hours as needed for pain or fever. 30 tablet 0  . alendronate (FOSAMAX) 70 MG tablet Take 70 mg by mouth once a week.    . cyanocobalamin (,VITAMIN B-12,) 1000 MCG/ML injection Inject 1,000 mcg into the muscle every 30 (thirty) days. Administered by Dr. Eldridge Abrahams  - every other month (per pt)    . gabapentin (NEURONTIN) 100 MG capsule Take 1 capsule (100 mg total) by mouth 2 (two) times daily. Take 1 tablet once a day (Patient taking differently: Take 100 mg by mouth daily. ) 30 capsule 0  . guaifenesin (ROBITUSSIN) 100 MG/5ML syrup Take 200 mg by mouth 3 (three) times daily as needed for cough.     . Menthol-Ascorbic Acid (LUDENS COUGH DROPS MT) Use as directed 1 lozenge in the mouth or throat 3 (three) times daily as needed (cough).     Marland Kitchen  Multiple Vitamin (MULTIVITAMIN WITH MINERALS) TABS tablet Take 1 tablet by mouth daily.    Marland Kitchen oxyCODONE-acetaminophen (PERCOCET/ROXICET) 5-325 MG tablet Take 1 tablet by mouth every 8 (eight) hours as needed for severe pain. 20 tablet 0  . prochlorperazine (COMPAZINE) 10 MG tablet Take 1 tablet (10 mg total) by mouth every 6 (six) hours as needed for nausea or vomiting. 30 tablet 0   No current facility-administered medications for this visit.    Facility-Administered Medications Ordered in Other Visits  Medication Dose Route Frequency Provider  Last Rate Last Dose  . durvalumab (IMFINZI) 360 mg in sodium chloride 0.9 % 100 mL chemo infusion  10.5 mg/kg (Treatment Plan Recorded) Intravenous Once Curt Bears, MD 107 mL/hr at 04/18/18 1512 360 mg at 04/18/18 1512    SURGICAL HISTORY:  Past Surgical History:  Procedure Laterality Date  . ELBOW SURGERY     for  "Tennis Elbow"  . hand surgery    . IR KYPHO THORACIC WITH BONE BIOPSY  11/27/2017  . IR KYPHO THORACIC WITH BONE BIOPSY  03/01/2018  . IR KYPHO THORACIC WITH BONE BIOPSY  03/26/2018  . IR RADIOLOGIST EVAL & MGMT  11/20/2017  . IR RADIOLOGIST EVAL & MGMT  02/26/2018  . IR RADIOLOGIST EVAL & MGMT  03/13/2018  . MULTIPLE TOOTH EXTRACTIONS    . TUBAL LIGATION    . VIDEO BRONCHOSCOPY N/A 08/31/2017   Procedure: VIDEO BRONCHOSCOPY;  Surgeon: Melrose Nakayama, MD;  Location: [facility] OR;  Service: Thoracic;  Laterality: N/A;    REVIEW OF SYSTEMS:   Review of Systems  Constitutional: Positive for ~3lb weight loss over 2 weeks. Negative for appetite change, chills, fatigue, and fever.  HENT:   Negative for mouth sores, nosebleeds, sore throat and trouble swallowing.   Eyes: Negative for eye problems and icterus.  Respiratory: Negative for cough, hemoptysis, shortness of breath and wheezing.   Cardiovascular: Negative for chest pain and leg swelling.  Gastrointestinal: Positive for nausea. Negative for abdominal pain, constipation, diarrhea, and vomiting.  Genitourinary: Negative for bladder incontinence, difficulty urinating, dysuria, frequency and hematuria.   Musculoskeletal: Positive for back pain. Negative for gait problem, neck pain and neck stiffness.  Skin: Negative for itching and rash.  Neurological: Negative for dizziness, extremity weakness, gait problem, headaches, light-headedness and seizures.  Hematological: Negative for adenopathy. Does not bruise/bleed easily.  Psychiatric/Behavioral: Negative for confusion, depression and sleep disturbance. The patient is not  nervous/anxious.     PHYSICAL EXAMINATION:  Blood pressure 112/64, pulse 90, temperature 98.8 F (37.1 C), temperature source Oral, resp. rate 18, height 5\' 1"  (1.549 m), weight 71 lb 11.2 oz (32.5 kg), SpO2 92 %.  ECOG PERFORMANCE STATUS: 1 - Symptomatic but completely ambulatory  Physical Exam  Constitutional: Oriented to person, place, and time and thin-appearing female and in no distress.  HENT:  Head: Normocephalic and atraumatic.  Mouth/Throat: Oropharynx is clear and moist. No oropharyngeal exudate.  Eyes: Conjunctivae are normal. Right eye exhibits no discharge. Left eye exhibits no discharge. No scleral icterus.  Neck: Normal range of motion. Neck supple.  Cardiovascular: Normal rate, regular rhythm, normal heart sounds and intact distal pulses.   Pulmonary/Chest: Diffuse expiratory wheezing bilaterally. Effort normal. No respiratory distress.  No rales.  Abdominal: Soft. Bowel sounds are normal. Exhibits no distension and no mass. There is no tenderness.  Musculoskeletal: Normal range of motion. Exhibits no edema.  Lymphadenopathy:    No cervical adenopathy.  Neurological: Alert and oriented to person, place, and time. Exhibits  normal muscle tone. Gait normal. Coordination normal.  Skin: Skin is warm and dry. No rash noted. Not diaphoretic. No erythema. No pallor.  Psychiatric: Mood, memory and judgment normal.  Vitals reviewed.  LABORATORY DATA: Lab Results  Component Value Date   WBC 5.6 04/18/2018   HGB 12.3 04/18/2018   HCT 39.9 04/18/2018   MCV 90.5 04/18/2018   PLT 319 04/18/2018      Chemistry      Component Value Date/Time   NA 138 04/18/2018 1151   K 3.9 04/18/2018 1151   CL 102 04/18/2018 1151   CO2 24 04/18/2018 1151   BUN 15 04/18/2018 1151   CREATININE 0.80 04/18/2018 1151      Component Value Date/Time   CALCIUM 8.6 (L) 04/18/2018 1151   ALKPHOS 104 04/18/2018 1151   AST 15 04/18/2018 1151   ALT 8 04/18/2018 1151   BILITOT 0.4 04/18/2018  1151       RADIOGRAPHIC STUDIES:  Ct Chest W Contrast  Result Date: 03/20/2018 CLINICAL DATA:  73 year old female with history of poorly differentiated squamous cell carcinoma of the lung status post chemotherapy and radiation therapy. EXAM: CT CHEST WITH CONTRAST TECHNIQUE: Multidetector CT imaging of the chest was performed during intravenous contrast administration. CONTRAST:  21mL OMNIPAQUE IOHEXOL 300 MG/ML  SOLN COMPARISON:  Chest CT 09/16/2017. FINDINGS: Cardiovascular: Heart size is normal. There is no significant pericardial fluid, thickening or pericardial calcification. There is aortic atherosclerosis, as well as atherosclerosis of the great vessels of the mediastinum and the coronary arteries, including calcified atherosclerotic plaque in the left anterior descending coronary artery. Mediastinum/Nodes: No pathologically enlarged mediastinal or hilar lymph nodes. Esophagus is unremarkable in appearance. No axillary lymphadenopathy. Lungs/Pleura: Previously noted mass-like area of architectural distortion in the medial aspect of the left lower lobe has significantly decreased in size compared to the prior study, most compatible with an area of postradiation mass-like fibrosis. Scattered surrounding areas of septal thickening, regional architectural distortion and nodularity are also noted in adjacent areas of both the left upper and left lower lobes, also compatible with evolving postradiation changes. 7 mm nodule in the superior segment of the right lower lobe (axial image 61 of series 7) is stable dating back to 07/13/2017, potentially benign. No other definite suspicious appearing pulmonary nodules or masses are noted. Diffuse bronchial wall thickening with moderate centrilobular and paraseptal emphysema. Trace left pleural effusion. Upper Abdomen: Small calcified granulomas in the right lobe of the liver. Musculoskeletal: Chronic compression fractures of T9 and T10, similar to the prior  examination with post vertebroplasty changes at both levels. There are no aggressive appearing lytic or blastic lesions noted in the visualized portions of the skeleton. IMPRESSION: 1. Positive response to therapy with decreased size of treated lesion in the left lower lobe with evolving postradiation changes in the left lung, as above. 2. Trace left pleural effusion. 3. Stable 7 mm nodule in the superior segment of the right lower lobe, favored to be benign. Continued attention on follow-up studies is recommended. 4. Aortic atherosclerosis, in addition to left anterior descending coronary artery disease. Assessment for potential risk factor modification, dietary therapy or pharmacologic therapy may be warranted, if clinically indicated. 5. Diffuse bronchial wall thickening with moderate centrilobular and paraseptal emphysema; imaging findings suggestive of underlying COPD. Aortic Atherosclerosis (ICD10-I70.0) and Emphysema (ICD10-J43.9). Electronically Signed   By: Vinnie Langton M.D.   On: 03/20/2018 09:36   Ir Kypho Thoracic With Bone Biopsy  Result Date: 03/26/2018 CLINICAL DATA:  Subacute painful  thoracic T11 compression fracture deformity EXAM: KYPHOPLASTY AT THORACIC T11 FLUOROSCOPICALLY GUIDED DEEP CORE BONE BIOPSY T11 VERTEBRAL BODY TECHNIQUE: The procedure, risks (including but not limited to bleeding, infection, organ damage), benefits, and alternatives were explained to the patient. Questions regarding the procedure were encouraged and answered. The patient understands and consents to the procedure. The patient was placed prone on the fluoroscopic table. The skin overlying the lower thoracic region was then prepped and draped in the usual sterile fashion. Maximal barrier sterile technique was utilized including caps, mask, sterile gowns, sterile gloves, sterile drape, hand hygiene and skin antiseptic. Intravenous Fentanyl 154mcg and Versed 2mg  were administered as conscious sedation during  continuous monitoring of the patient's level of consciousness and physiological / cardiorespiratory status by the radiology RN, with a total moderate sedation time of 30 minutes. As antibiotic prophylaxis, vancomycin 1 g was ordered pre-procedure and administered intravenously within !one hour! of incision. The right pedicle at thoracic T11 was then infiltrated with 1% lidocaine followed by the advancement of a Kyphon trocar needle through the right pedicle into the posterior one-third. The trocar was removed and coaxial bone biopsy obtained. The osteo drill was advanced to the anterior third of the vertebral body. The osteo drill was retracted. Through the working cannula, a Kyphon inflatable bone tamp 15 x 2 was advanced and positioned with the distal marker 5 mm from the anterior aspect of the cortex. Crossing of the midline was not seen on the AP projection. At this time, the balloon was expanded using contrast via a Kyphon inflation syringe device via micro tubing. In similar fashion, the left pedicle was infiltrated with 1% lidocaine followed by the advancement of a second Kyphon trocar needle through the left pedicle into the posterior third of the vertebral body. Coaxial bone biopsy sample obtained. The osteo drill was coaxially advanced to the anterior right third. The osteo drill was exchanged for a Kyphon inflatable bone tamp 15 x 2, advanced to the 5 mm of the anterior aspect of the cortex. The balloon was then expanded using contrast as above. Inflations were continued until there was near apposition across the midline and with the superior and the inferior end plates. At this time, methylmethacrylate mixture was reconstituted in the Kyphon bone mixing device system. This was then loaded into the delivery mechanism, attached to Kyphon bone fillers. The balloons were deflated and removed followed by the instillation of methylmethacrylate mixture with excellent filling in the AP and lateral projections.  No extravasation was noted in the disk spaces or posteriorly into the spinal canal. No epidural venous contamination was seen. The patient tolerated the procedure well. There were no acute complications. The working cannulae and the bone filler were then retrieved and removed. COMPLICATIONS: COMPLICATIONS None immediate. IMPRESSION: 1. Status post vertebral body augmentation using balloon kyphoplasty at thoracic T11 as described without event. 2. Technically fluoroscopically guided successful deep core bone biopsy, T11 vertebral body 3. Per CMS PQRS reporting requirements (PQRS Measure 24): Given the patient's age of greater than 27 and the fracture site (hip, distal radius, or spine), the patient should be tested for osteoporosis using DXA, and the appropriate treatment considered based on the DXA results. Electronically Signed   By: Lucrezia Europe M.D.   On: 03/26/2018 19:39     ASSESSMENT/PLAN:  This is a very pleasant 73 year old Caucasian female with stage IIIa non-small cell lung cancer, poorly differentiated squamous cell carcinoma.  She was diagnosed in August 2019.  The patient underwent a course  of concurrent chemoradiation with weekly carboplatin for an AUC of 2 and paclitaxel 45 mg/m.  She is status post 7 cycles with a partial response.   The patient is currently undergoing consolidation immunotherapy with Imfinzi.  She is tolerating treatment well without any adverse effects.  She is status post 8 cycles of treatment.  Patient was seen with Dr. Julien Nordmann today.  Labs were reviewed with the patient.  I recommend that she proceed with cycle #9 today as scheduled. We will see the patient back in 2 weeks for evaluation prior to starting cycle #10.  She will follow-up with her primary care physician regarding her back pain/compression fractures secondary to osteoporosis. We discussed that she may take ibuprofen for her back pain as well.   The patient no longer has any anti-emetic medication for  nausea. I have sent a prescription for anti-nausea medicine to the patient's pharmacy.  The patient was advised to call immediately if she has any concerning symptoms in the interval. The patient voices understanding of current disease status and treatment options and is in agreement with the current care plan. All questions were answered. The patient knows to call the clinic with any problems, questions or concerns. We can certainly see the patient much sooner if necessary  No orders of the defined types were placed in this encounter.    Sherry Berrong L Takaya Hyslop, PA-C 04/18/18  ADDENDUM: Hematology/Oncology Attending: I had a face-to-face encounter with the patient today.  I recommended her care plan.  This is a very pleasant 73 years old white female with a stage IIIa non-small cell lung cancer, squamous cell carcinoma status post concurrent chemoradiation with partial response and she is currently on consolidation treatment with Imfinzi status post 8 cycles.  She has been tolerating this treatment well with no concerning complaints. The patient continues to complain of back pain secondary to compression fracture status post vertebroplasty.  She is currently on pain medication by her primary care physician. I recommended for her to proceed with cycle #9 today as scheduled. I will see her back for follow-up visit in 2 weeks for evaluation with the next cycle of her treatment. She was advised to call immediately if she has any concerning symptoms in the interval. Disclaimer: This note was dictated with voice recognition software. Similar sounding words can inadvertently be transcribed and may be missed upon review. Eilleen Kempf, MD 04/18/18

## 2018-04-18 NOTE — Patient Instructions (Signed)
[facility] Discharge Instructions for Patients Receiving Chemotherapy  Today you received the following chemotherapy agents: Imfinzi.  To help prevent nausea and vomiting after your treatment, we encourage you to take your nausea medication as directed.   If you develop nausea and vomiting that is not controlled by your nausea medication, call the clinic.   BELOW ARE SYMPTOMS THAT SHOULD BE REPORTED IMMEDIATELY:  *FEVER GREATER THAN 100.5 F  *CHILLS WITH OR WITHOUT FEVER  NAUSEA AND VOMITING THAT IS NOT CONTROLLED WITH YOUR NAUSEA MEDICATION  *UNUSUAL SHORTNESS OF BREATH  *UNUSUAL BRUISING OR BLEEDING  TENDERNESS IN MOUTH AND THROAT WITH OR WITHOUT PRESENCE OF ULCERS  *URINARY PROBLEMS  *BOWEL PROBLEMS  UNUSUAL RASH Items with * indicate a potential emergency and should be followed up as soon as possible.  Feel free to call the clinic should you have any questions or concerns. The clinic phone number is [phone].  Please show the CHEMO ALERT CARD at check-in to the Emergency Department and triage nurse.   

## 2018-05-02 ENCOUNTER — Other Ambulatory Visit: Payer: Self-pay

## 2018-05-02 ENCOUNTER — Encounter: Payer: Self-pay | Admitting: Internal Medicine

## 2018-05-02 ENCOUNTER — Inpatient Hospital Stay: Payer: Medicare Other

## 2018-05-02 ENCOUNTER — Inpatient Hospital Stay (HOSPITAL_BASED_OUTPATIENT_CLINIC_OR_DEPARTMENT_OTHER): Payer: Medicare Other | Admitting: Internal Medicine

## 2018-05-02 VITALS — BP 139/81 | HR 69 | Temp 97.9°F | Resp 20 | Ht 61.0 in | Wt 73.6 lb

## 2018-05-02 DIAGNOSIS — F329 Major depressive disorder, single episode, unspecified: Secondary | ICD-10-CM | POA: Diagnosis not present

## 2018-05-02 DIAGNOSIS — M549 Dorsalgia, unspecified: Secondary | ICD-10-CM | POA: Diagnosis not present

## 2018-05-02 DIAGNOSIS — Z5112 Encounter for antineoplastic immunotherapy: Secondary | ICD-10-CM | POA: Diagnosis not present

## 2018-05-02 DIAGNOSIS — C3492 Malignant neoplasm of unspecified part of left bronchus or lung: Secondary | ICD-10-CM

## 2018-05-02 DIAGNOSIS — C3432 Malignant neoplasm of lower lobe, left bronchus or lung: Secondary | ICD-10-CM

## 2018-05-02 DIAGNOSIS — J449 Chronic obstructive pulmonary disease, unspecified: Secondary | ICD-10-CM

## 2018-05-02 LAB — CMP (CANCER CENTER ONLY)
ALT: <6 U/L (ref 0–44)
AST: 14 U/L — ABNORMAL LOW (ref 15–41)
Albumin: 3.7 g/dL (ref 3.5–5.0)
Alkaline Phosphatase: 114 U/L (ref 38–126)
Anion gap: 10 (ref 5–15)
BUN: 13 mg/dL (ref 8–23)
CO2: 27 mmol/L (ref 22–32)
Calcium: 8.9 mg/dL (ref 8.9–10.3)
Chloride: 104 mmol/L (ref 98–111)
Creatinine: 0.77 mg/dL (ref 0.44–1.00)
GFR, Est AFR Am: >60 mL/min (ref >60)
GFR, Estimated: >60 mL/min (ref >60)
Glucose, Bld: 88 mg/dL (ref 70–99)
Potassium: 4 mmol/L (ref 3.5–5.1)
Sodium: 141 mmol/L (ref 135–145)
Total Bilirubin: 0.3 mg/dL (ref 0.3–1.2)
Total Protein: 7.3 g/dL (ref 6.5–8.1)

## 2018-05-02 LAB — CBC WITH DIFFERENTIAL (CANCER CENTER ONLY)
Abs Immature Granulocytes: 0.01 10*3/uL (ref 0.00–0.07)
Basophils Absolute: 0 10*3/uL (ref 0.0–0.1)
Basophils Relative: 1 %
Eosinophils Absolute: 0.1 10*3/uL (ref 0.0–0.5)
Eosinophils Relative: 2 %
HCT: 40.2 % (ref 36.0–46.0)
Hemoglobin: 12.3 g/dL (ref 12.0–15.0)
Immature Granulocytes: 0 %
Lymphocytes Relative: 25 %
Lymphs Abs: 1.1 10*3/uL (ref 0.7–4.0)
MCH: 27.9 pg (ref 26.0–34.0)
MCHC: 30.6 g/dL (ref 30.0–36.0)
MCV: 91.2 fL (ref 80.0–100.0)
Monocytes Absolute: 0.5 10*3/uL (ref 0.1–1.0)
Monocytes Relative: 12 %
Neutro Abs: 2.7 10*3/uL (ref 1.7–7.7)
Neutrophils Relative %: 60 %
Platelet Count: 314 10*3/uL (ref 150–400)
RBC: 4.41 MIL/uL (ref 3.87–5.11)
RDW: 16.2 % — ABNORMAL HIGH (ref 11.5–15.5)
WBC Count: 4.5 10*3/uL (ref 4.0–10.5)
nRBC: 0 % (ref 0.0–0.2)

## 2018-05-02 MED ORDER — SODIUM CHLORIDE 0.9 % IV SOLN
10.5000 mg/kg | Freq: Once | INTRAVENOUS | Status: AC
  Administered 2018-05-02: 360 mg via INTRAVENOUS
  Filled 2018-05-02: qty 7.2

## 2018-05-02 MED ORDER — SODIUM CHLORIDE 0.9 % IV SOLN
Freq: Once | INTRAVENOUS | Status: AC
  Administered 2018-05-02: 14:00:00 via INTRAVENOUS
  Filled 2018-05-02: qty 250

## 2018-05-02 NOTE — Progress Notes (Signed)
[place] [phone]   [phone]  OFFICE PROGRESS NOTE  [name], NP [place] [address]  DIAGNOSIS: Stage IIb/IIIa (T3, N0/N1, M0) non-small cell lung cancer poorly differentiated squamous cell carcinoma diagnosed in August 2019.  The patient presented with large superior segment left lower lobe mass with questionable left hilar adenopathy.  PRIOR THERAPY: Concurrent chemoradiation with weekly carboplatin for AUC of 2 and paclitaxel 45 mg/M2.  Status post 7 cycles.  Last dose was given October 29, 2017 with partial response.  CURRENT THERAPY: Consolidation treatment with immunotherapy with Imfinzi 10 mg/KG every 2 weeks to start December 13, 2017.  Status post 9 cycles.  INTERVAL HISTORY: Sherry [name] 73 y.o. female returns to the clinic today for follow-up visit.  The patient is feeling fine today with no concerning complaints except for the persistent low back pain secondary to osteoporosis and compression fractures.  She is currently on treatment with Percocet by her primary care physician.  She was unable to see interventional radiology during this time because most of the elective procedures are currently on hold secondary to the [place] pandemic.  She denied having any current chest pain, shortness of breath, cough or hemoptysis.  She denied having any fever or chills.  She has no nausea, vomiting, diarrhea or constipation.  She continues to tolerate her treatment with Imfinzi fairly well.  She is here today for evaluation before starting cycle #.   MEDICAL HISTORY: Past Medical History:  Diagnosis Date  . Allergy   . Anxiety   . Arthritis   . Cavitating mass in left lower lung lobe   . Complication of anesthesia    woke up during procedure  . COPD (chronic obstructive pulmonary disease) ([place])   . Depression   . Family history of adverse reaction to anesthesia    " my mother was always hard to wake  up"  . Full dentures   . Headache   . Neuromuscular disorder ([place])   . Pneumonia   . Thoracic spine fracture ([place]) 11/01/2017   T9  . Wears glasses     ALLERGIES:  is allergic to penicillins.  MEDICATIONS:  Current Outpatient Medications  Medication Sig Dispense Refill  . acetaminophen (TYLENOL 8 HOUR) 650 MG CR tablet Take 1 tablet (650 mg total) by mouth every 8 (eight) hours as needed for pain or fever. 30 tablet 0  . alendronate (FOSAMAX) 70 MG tablet Take 70 mg by mouth once a week.    . cyanocobalamin (,VITAMIN B-12,) 1000 MCG/ML injection Inject 1,000 mcg into the muscle every 30 (thirty) days. Administered by Dr. Eldridge Abrahams  - every other month (per pt)    . gabapentin (NEURONTIN) 100 MG capsule Take 1 capsule (100 mg total) by mouth 2 (two) times daily. Take 1 tablet once a day (Patient taking differently: Take 100 mg by mouth daily. ) 30 capsule 0  . guaifenesin (ROBITUSSIN) 100 MG/5ML syrup Take 200 mg by mouth 3 (three) times daily as needed for cough.     . Menthol-Ascorbic Acid (LUDENS COUGH DROPS MT) Use as directed 1 lozenge in the mouth or throat 3 (three) times daily as needed (cough).     . Multiple Vitamin (MULTIVITAMIN WITH MINERALS) TABS tablet Take 1 tablet by mouth daily.    Marland Kitchen oxyCODONE-acetaminophen (PERCOCET/ROXICET) 5-325 MG tablet Take 1 tablet by mouth every 8 (eight) hours as needed for severe pain. [place]  tablet 0  . prochlorperazine (COMPAZINE) 10 MG tablet Take 1 tablet (10 mg total) by mouth every 6 (six) hours as needed for nausea or vomiting. 30 tablet 0   No current facility-administered medications for this visit.     SURGICAL HISTORY:  Past Surgical History:  Procedure Laterality Date  . ELBOW SURGERY     for  "Tennis Elbow"  . hand surgery    . IR KYPHO THORACIC WITH BONE BIOPSY  11/27/2017  . IR KYPHO THORACIC WITH BONE BIOPSY  03/01/2018  . IR KYPHO THORACIC WITH BONE BIOPSY  03/26/2018  . IR RADIOLOGIST EVAL & MGMT  11/20/2017  . IR  RADIOLOGIST EVAL & MGMT  02/26/2018  . IR RADIOLOGIST EVAL & MGMT  03/13/2018  . MULTIPLE TOOTH EXTRACTIONS    . TUBAL LIGATION    . VIDEO BRONCHOSCOPY N/A 08/31/2017   Procedure: VIDEO BRONCHOSCOPY;  Surgeon: Melrose Nakayama, MD;  Location: [facility] OR;  Service: Thoracic;  Laterality: N/A;    REVIEW OF SYSTEMS:  A comprehensive review of systems was negative except for: Musculoskeletal: positive for back pain   PHYSICAL EXAMINATION: General appearance: alert, cooperative and no distress Head: Normocephalic, without obvious abnormality, atraumatic Neck: no adenopathy, no JVD, supple, symmetrical, trachea midline and thyroid not enlarged, symmetric, no tenderness/mass/nodules Lymph nodes: Cervical, supraclavicular, and axillary nodes normal. Resp: clear to auscultation bilaterally Back: symmetric, no curvature. ROM normal. No CVA tenderness. Cardio: regular rate and rhythm, S1, S2 normal, no murmur, click, rub or gallop GI: soft, non-tender; bowel sounds normal; no masses,  no organomegaly Extremities: extremities normal, atraumatic, no cyanosis or edema  ECOG PERFORMANCE STATUS: 1 - Symptomatic but completely ambulatory  Blood pressure 139/81, pulse 69, temperature 97.9 F (36.6 C), temperature source Oral, resp. rate 20, height 5\' 1"  (1.549 m), weight 73 lb 9.6 oz (33.4 kg), SpO2 94 %.  LABORATORY DATA: Lab Results  Component Value Date   WBC 4.5 05/02/2018   HGB 12.3 05/02/2018   HCT 40.2 05/02/2018   MCV 91.2 05/02/2018   PLT 314 05/02/2018      Chemistry      Component Value Date/Time   NA 138 04/18/2018 1151   K 3.9 04/18/2018 1151   CL 102 04/18/2018 1151   CO2 24 04/18/2018 1151   BUN 15 04/18/2018 1151   CREATININE 0.80 04/18/2018 1151      Component Value Date/Time   CALCIUM 8.6 (L) 04/18/2018 1151   ALKPHOS 104 04/18/2018 1151   AST 15 04/18/2018 1151   ALT 8 04/18/2018 1151   BILITOT 0.4 04/18/2018 1151       RADIOGRAPHIC STUDIES: No results found.   ASSESSMENT AND PLAN: This is a very pleasant 74 years old white female with a stage IIIa non-small cell lung cancer, poorly differentiated squamous cell carcinoma.   The patient underwent a course of concurrent chemoradiation with weekly carboplatin and paclitaxel status post 7 cycles with partial response. The patient is currently undergoing consolidation treatment with immunotherapy with Imfinzi status post 9 cycles.   The patient continues to tolerate this treatment well with no concerning adverse effects. I recommended for her to proceed with cycle #10 today as scheduled. I will see her back for follow-up visit in 2 weeks for evaluation before starting cycle #11. For the back pain the patient will continue with her current pain medication by her primary care physician.  She was also advised to apply local Salonpas patches to that area as needed. The patient was advised  to call immediately if she has any concerning symptoms in the interval. The patient voices understanding of current disease status and treatment options and is in agreement with the current care plan. All questions were answered. The patient knows to call the clinic with any problems, questions or concerns. We can certainly see the patient much sooner if necessary.  I spent 10 minutes counseling the patient face to face. The total time spent in the appointment was 15 minutes.  Disclaimer: This note was dictated with voice recognition software. Similar sounding words can inadvertently be transcribed and may not be corrected upon review.

## 2018-05-02 NOTE — Patient Instructions (Signed)
[facility] Discharge Instructions for Patients Receiving Chemotherapy  Today you received the following chemotherapy agents: Imfinzi.  To help prevent nausea and vomiting after your treatment, we encourage you to take your nausea medication as directed.   If you develop nausea and vomiting that is not controlled by your nausea medication, call the clinic.   BELOW ARE SYMPTOMS THAT SHOULD BE REPORTED IMMEDIATELY:  *FEVER GREATER THAN 100.5 F  *CHILLS WITH OR WITHOUT FEVER  NAUSEA AND VOMITING THAT IS NOT CONTROLLED WITH YOUR NAUSEA MEDICATION  *UNUSUAL SHORTNESS OF BREATH  *UNUSUAL BRUISING OR BLEEDING  TENDERNESS IN MOUTH AND THROAT WITH OR WITHOUT PRESENCE OF ULCERS  *URINARY PROBLEMS  *BOWEL PROBLEMS  UNUSUAL RASH Items with * indicate a potential emergency and should be followed up as soon as possible.  Feel free to call the clinic should you have any questions or concerns. The clinic phone number is [phone].  Please show the CHEMO ALERT CARD at check-in to the Emergency Department and triage nurse.   

## 2018-05-03 ENCOUNTER — Telehealth: Payer: Self-pay | Admitting: Internal Medicine

## 2018-05-03 NOTE — Telephone Encounter (Signed)
Tried to reach regarding schedule °

## 2018-05-10 ENCOUNTER — Encounter: Payer: Self-pay | Admitting: General Practice

## 2018-05-10 NOTE — Progress Notes (Signed)
[place] Team contacted patient to assess for food insecurity and other psychosocial needs during current COVID19 pandemic.  Gets help from family, does not have to go out.  Has all she needs.   Patient/family expressed no needs at this time.  Support Team member encouraged patient to call if changes occur or they have any other questions/concerns.   Beverely Pace, [place]

## 2018-05-16 ENCOUNTER — Inpatient Hospital Stay: Payer: Medicare Other | Attending: Nurse Practitioner

## 2018-05-16 ENCOUNTER — Other Ambulatory Visit: Payer: Self-pay

## 2018-05-16 ENCOUNTER — Encounter: Payer: Self-pay | Admitting: Physician Assistant

## 2018-05-16 ENCOUNTER — Inpatient Hospital Stay: Payer: Medicare Other

## 2018-05-16 ENCOUNTER — Inpatient Hospital Stay (HOSPITAL_BASED_OUTPATIENT_CLINIC_OR_DEPARTMENT_OTHER): Payer: Medicare Other | Admitting: Physician Assistant

## 2018-05-16 VITALS — BP 122/61 | HR 69 | Temp 99.4°F | Resp 18 | Ht 61.0 in | Wt 73.9 lb

## 2018-05-16 DIAGNOSIS — C3432 Malignant neoplasm of lower lobe, left bronchus or lung: Secondary | ICD-10-CM

## 2018-05-16 DIAGNOSIS — M4854XA Collapsed vertebra, not elsewhere classified, thoracic region, initial encounter for fracture: Secondary | ICD-10-CM | POA: Insufficient documentation

## 2018-05-16 DIAGNOSIS — M1990 Unspecified osteoarthritis, unspecified site: Secondary | ICD-10-CM

## 2018-05-16 DIAGNOSIS — Z79899 Other long term (current) drug therapy: Secondary | ICD-10-CM | POA: Insufficient documentation

## 2018-05-16 DIAGNOSIS — Z880 Allergy status to penicillin: Secondary | ICD-10-CM | POA: Insufficient documentation

## 2018-05-16 DIAGNOSIS — M546 Pain in thoracic spine: Secondary | ICD-10-CM

## 2018-05-16 DIAGNOSIS — Z5112 Encounter for antineoplastic immunotherapy: Secondary | ICD-10-CM

## 2018-05-16 DIAGNOSIS — K130 Diseases of lips: Secondary | ICD-10-CM

## 2018-05-16 DIAGNOSIS — J449 Chronic obstructive pulmonary disease, unspecified: Secondary | ICD-10-CM

## 2018-05-16 DIAGNOSIS — R21 Rash and other nonspecific skin eruption: Secondary | ICD-10-CM

## 2018-05-16 DIAGNOSIS — C3492 Malignant neoplasm of unspecified part of left bronchus or lung: Secondary | ICD-10-CM

## 2018-05-16 LAB — CBC WITH DIFFERENTIAL (CANCER CENTER ONLY)
Abs Immature Granulocytes: 0.02 10*3/uL (ref 0.00–0.07)
Basophils Absolute: 0 10*3/uL (ref 0.0–0.1)
Basophils Relative: 1 %
Eosinophils Absolute: 0.1 10*3/uL (ref 0.0–0.5)
Eosinophils Relative: 2 %
HCT: 40.9 % (ref 36.0–46.0)
Hemoglobin: 12.3 g/dL (ref 12.0–15.0)
Immature Granulocytes: 1 %
Lymphocytes Relative: 21 %
Lymphs Abs: 0.9 10*3/uL (ref 0.7–4.0)
MCH: 28.2 pg (ref 26.0–34.0)
MCHC: 30.1 g/dL (ref 30.0–36.0)
MCV: 93.8 fL (ref 80.0–100.0)
Monocytes Absolute: 0.6 10*3/uL (ref 0.1–1.0)
Monocytes Relative: 14 %
Neutro Abs: 2.6 10*3/uL (ref 1.7–7.7)
Neutrophils Relative %: 61 %
Platelet Count: 173 10*3/uL (ref 150–400)
RBC: 4.36 MIL/uL (ref 3.87–5.11)
RDW: 16.3 % — ABNORMAL HIGH (ref 11.5–15.5)
WBC Count: 4.1 10*3/uL (ref 4.0–10.5)
nRBC: 0 % (ref 0.0–0.2)

## 2018-05-16 LAB — TSH: TSH: 2.508 u[IU]/mL (ref 0.308–3.960)

## 2018-05-16 LAB — CMP (CANCER CENTER ONLY)
ALT: 10 U/L (ref 0–44)
AST: 16 U/L (ref 15–41)
Albumin: 3.6 g/dL (ref 3.5–5.0)
Alkaline Phosphatase: 106 U/L (ref 38–126)
Anion gap: 11 (ref 5–15)
BUN: 11 mg/dL (ref 8–23)
CO2: 23 mmol/L (ref 22–32)
Calcium: 8.9 mg/dL (ref 8.9–10.3)
Chloride: 106 mmol/L (ref 98–111)
Creatinine: 0.72 mg/dL (ref 0.44–1.00)
GFR, Est AFR Am: >60 mL/min (ref >60)
GFR, Estimated: >60 mL/min (ref >60)
Glucose, Bld: 94 mg/dL (ref 70–99)
Potassium: 3.8 mmol/L (ref 3.5–5.1)
Sodium: 140 mmol/L (ref 135–145)
Total Bilirubin: 0.4 mg/dL (ref 0.3–1.2)
Total Protein: 7.3 g/dL (ref 6.5–8.1)

## 2018-05-16 MED ORDER — SODIUM CHLORIDE 0.9 % IV SOLN
Freq: Once | INTRAVENOUS | Status: AC
  Administered 2018-05-16: 11:00:00 via INTRAVENOUS
  Filled 2018-05-16: qty 250

## 2018-05-16 MED ORDER — SODIUM CHLORIDE 0.9 % IV SOLN
10.5000 mg/kg | Freq: Once | INTRAVENOUS | Status: AC
  Administered 2018-05-16: 360 mg via INTRAVENOUS
  Filled 2018-05-16: qty 7.2

## 2018-05-16 MED ORDER — GABAPENTIN 100 MG PO CAPS: 100.0000 mg | ORAL_CAPSULE | Freq: Three times a day (TID) | ORAL | 2 refills | Status: DC

## 2018-05-16 NOTE — Progress Notes (Signed)
[place] OFFICE PROGRESS NOTE  [name], NP [place] [address]  DIAGNOSIS: Stage IIb/IIIa (T3, N0/N1, M0)non-small cell lung cancer poorly differentiated squamous cell carcinoma diagnosed in August 2019. The patient presented with large superior segment leftlower lobe mass with questionable left hilar adenopathy.  PRIOR THERAPY: Concurrent chemoradiation with weekly carboplatin for AUC of 2 and paclitaxel 45 mg/M2.  Status post 7 cycles.  Last dose was given October 29, 2017 with partial response.  CURRENT THERAPY: Consolidation treatment with immunotherapy with Imfinzi 10 mg/KG every 2 weeks to start December 13, 2017.  Status post 10 cycles.  INTERVAL HISTORY: Sherry [name] 73 y.o. female returns to the clinic for a follow-up visit.  The patient feels well today but continues to endorse thoracic back pain secondary to her compression fracture from her osteoporosis. She is currently taking hydrocodone which is prescribed by her PCP. She is also using gabapentin and Salonpas patches with some relief. Otherwise, she continues to tolerate her treatment well without any adverse effects.  She denies any fever, chills, night sweats, or weight loss.  She denies any nausea, vomiting, diarrhea, or constipation.  She denies any chest pain, shortness of breath, cough, and hemoptysis.  She denies any headache or visual changes.  She notes small dry red patches occasionally on her hands since starting treatment.  She states that these are nonpruritic and do not cause her any discomfort.  He is here today for evaluation before starting cycle #11.   MEDICAL HISTORY: Past Medical History:  Diagnosis Date  . Allergy   . Anxiety   . Arthritis   . Cavitating mass in left lower lung lobe   . Complication of anesthesia    woke up during procedure  . COPD (chronic obstructive pulmonary disease) ([place])   . Depression   . Family history of adverse reaction to  anesthesia    " my mother was always hard to wake up"  . Full dentures   . Headache   . Neuromuscular disorder ([place])   . Pneumonia   . Thoracic spine fracture ([place]) 11/01/2017   T9  . Wears glasses     ALLERGIES:  is allergic to penicillins.  MEDICATIONS:  Current Outpatient Medications  Medication Sig Dispense Refill  . acetaminophen (TYLENOL 8 HOUR) 650 MG CR tablet Take 1 tablet (650 mg total) by mouth every 8 (eight) hours as needed for pain or fever. 30 tablet 0  . alendronate (FOSAMAX) 70 MG tablet Take 70 mg by mouth once a week.    . cyanocobalamin (,VITAMIN B-12,) 1000 MCG/ML injection Inject 1,000 mcg into the muscle every 30 (thirty) days. Administered by Dr. Eldridge Abrahams  - every other month (per pt)    . gabapentin (NEURONTIN) 100 MG capsule Take 1 capsule (100 mg total) by mouth 3 (three) times daily. Take 1 tablet once a day 90 capsule 2  . guaifenesin (ROBITUSSIN) 100 MG/5ML syrup Take 200 mg by mouth 3 (three) times daily as needed for cough.     . Menthol-Ascorbic Acid (LUDENS COUGH DROPS MT) Use as directed 1 lozenge in the mouth or throat 3 (three) times daily as needed (cough).     . Multiple Vitamin (MULTIVITAMIN WITH MINERALS) TABS tablet Take 1 tablet by mouth daily.    Marland Kitchen oxyCODONE-acetaminophen (PERCOCET/ROXICET) 5-325 MG tablet Take 1 tablet by mouth every 8 (eight) hours as needed for severe pain. 20 tablet 0  . prochlorperazine (COMPAZINE)  10 MG tablet Take 1 tablet (10 mg total) by mouth every 6 (six) hours as needed for nausea or vomiting. 30 tablet 0   No current facility-administered medications for this visit.     SURGICAL HISTORY:  Past Surgical History:  Procedure Laterality Date  . ELBOW SURGERY     for  "Tennis Elbow"  . hand surgery    . IR KYPHO THORACIC WITH BONE BIOPSY  11/27/2017  . IR KYPHO THORACIC WITH BONE BIOPSY  03/01/2018  . IR KYPHO THORACIC WITH BONE BIOPSY  03/26/2018  . IR RADIOLOGIST EVAL & MGMT  11/20/2017  . IR RADIOLOGIST  EVAL & MGMT  02/26/2018  . IR RADIOLOGIST EVAL & MGMT  03/13/2018  . MULTIPLE TOOTH EXTRACTIONS    . TUBAL LIGATION    . VIDEO BRONCHOSCOPY N/A 08/31/2017   Procedure: VIDEO BRONCHOSCOPY;  Surgeon: Melrose Nakayama, MD;  Location: [facility] OR;  Service: Thoracic;  Laterality: N/A;    REVIEW OF SYSTEMS:   Review of Systems  Constitutional: Negative for appetite change, chills, fatigue, fever and unexpected weight change.  HENT:   Negative for mouth sores, nosebleeds, sore throat and trouble swallowing.   Eyes: Negative for eye problems and icterus.  Respiratory: Negative for cough, hemoptysis, shortness of breath and wheezing.   Cardiovascular: Negative for chest pain and leg swelling.  Gastrointestinal: Negative for abdominal pain, constipation, diarrhea, nausea and vomiting.  Genitourinary: Negative for bladder incontinence, difficulty urinating, dysuria, frequency and hematuria.   Musculoskeletal: Positive for back pain secondary to her compression fracture in the T10 thoracic region. Negative gait problem, neck pain and neck stiffness.  Skin: Positive for a single rash on her left hand.  Neurological: Negative for dizziness, extremity weakness, gait problem, headaches, light-headedness and seizures.  Hematological: Negative for adenopathy. Does not bruise/bleed easily.  Psychiatric/Behavioral: Negative for confusion, depression and sleep disturbance. The patient is not nervous/anxious.     PHYSICAL EXAMINATION:  Blood pressure 122/61, pulse 69, temperature 99.4 F (37.4 C), temperature source Oral, resp. rate 18, height 5\' 1"  (1.549 m), weight 73 lb 14.4 oz (33.5 kg), SpO2 97 %.  ECOG PERFORMANCE STATUS: 1 - Symptomatic but completely ambulatory  Physical Exam  Constitutional: Oriented to person, place, and time and well-developed, well-nourished, and in no distress.   HENT:  Head: Normocephalic and atraumatic.  Mouth/Throat: Oropharynx is clear and moist. No oropharyngeal exudate.   Eyes: Conjunctivae are normal. Right eye exhibits no discharge. Left eye exhibits no discharge. No scleral icterus.  Neck: Normal range of motion. Neck supple.  Cardiovascular: Normal rate, regular rhythm, normal heart sounds and intact distal pulses.   Pulmonary/Chest: Expiratory wheezes in the right upper lung field. Effort normal and breath sounds normal in all other lung fields. No respiratory distress. No rales.  Abdominal: Soft. Bowel sounds are normal. Exhibits no distension and no mass. There is no tenderness.  Musculoskeletal: Normal range of motion. Exhibits no edema.  Lymphadenopathy:    No cervical adenopathy.  Neurological: Alert and oriented to person, place, and time. Exhibits normal muscle tone. Gait normal. Coordination normal.  Skin: Positive for a single small erythematous, dry, and well circumscribed rash on her left hand. It is a few millimeters in size.  Skin is warm and dry.  Not diaphoretic. No erythema. No pallor.  Psychiatric: Mood, memory and judgment normal.  Vitals reviewed.  LABORATORY DATA: Lab Results  Component Value Date   WBC 4.1 05/16/2018   HGB 12.3 05/16/2018   HCT 40.9 05/16/2018  MCV 93.8 05/16/2018   PLT 173 05/16/2018      Chemistry      Component Value Date/Time   NA 140 05/16/2018 0912   K 3.8 05/16/2018 0912   CL 106 05/16/2018 0912   CO2 23 05/16/2018 0912   BUN 11 05/16/2018 0912   CREATININE 0.72 05/16/2018 0912      Component Value Date/Time   CALCIUM 8.9 05/16/2018 0912   ALKPHOS 106 05/16/2018 0912   AST 16 05/16/2018 0912   ALT 10 05/16/2018 0912   BILITOT 0.4 05/16/2018 0912       RADIOGRAPHIC STUDIES:  No results found.   ASSESSMENT/PLAN:  This is a very pleasant 73 year old Caucasian female with stage IIIa non-small cell lung cancer, poorly differentiated squamous cell carcinoma.  She was diagnosed in August 2019.  The patient underwent a course of concurrent chemoradiation with weekly carboplatin for an AUC of  2 and paclitaxel 45 mg/m.  She is status post 7 cycles with a partial response. The patient is currently undergoing consolidation immunotherapy with Imfinzi 10 mg/kg IV every 2 weeks.  She is tolerating treatment well without any adverse effects.  She is status post 10 cycles of treatment. The patient was seen with Dr. Julien Nordmann today.  Labs were reviewed with the patient.  We recommend that she proceed with cycle #11 day as scheduled. I will see her back for follow-up visit in 2 weeks for evaluation prior to starting cycle #12. Regarding her compression fracture, the patient will continue with her current pain regimen with hydrocodone as prescribed by her PCP.  I also have sent a refill to the patient's pharmacy for gabapentin 100 mg 3 times daily.  She will also continue with the salonpas patches for pain.  The patient was advised to call immediately if she has any concerning symptoms in the interval. The patient voices understanding of current disease status and treatment options and is in agreement with the current care plan. All questions were answered. The patient knows to call the clinic with any problems, questions or concerns. We can certainly see the patient much sooner if necessary  No orders of the defined types were placed in this encounter.    Cassandra L Heilingoetter, PA-C 05/16/18  ADDENDUM: Hematology/Oncology Attending: I had a face-to-face encounter with the patient.  I recommended her care plan.  This is a very pleasant 73 years old white female with a stage IIIb non-small cell lung cancer, squamous cell carcinoma status post a course of concurrent chemoradiation with partial response and she is currently undergoing consolidation immunotherapy with Imfinzi every 2 weeks status post 10 cycles. The patient is doing fine and tolerating her treatment well. I recommended for her to proceed with cycle #11 today. For the back pain she will continue her current treatment with  gabapentin and she was advised to increase it if needed. She will come back for follow-up visit in 2 weeks for evaluation before the next cycle of her treatment. She was advised to call immediately if she has any concerning symptoms in the interval.  Disclaimer: This note was dictated with voice recognition software. Similar sounding words can inadvertently be transcribed and may be missed upon review. Eilleen Kempf, MD 05/16/18

## 2018-05-16 NOTE — Patient Instructions (Signed)
[facility] Discharge Instructions for Patients Receiving Chemotherapy  Today you received the following chemotherapy agents: Imfinzi.  To help prevent nausea and vomiting after your treatment, we encourage you to take your nausea medication as directed.   If you develop nausea and vomiting that is not controlled by your nausea medication, call the clinic.   BELOW ARE SYMPTOMS THAT SHOULD BE REPORTED IMMEDIATELY:  *FEVER GREATER THAN 100.5 F  *CHILLS WITH OR WITHOUT FEVER  NAUSEA AND VOMITING THAT IS NOT CONTROLLED WITH YOUR NAUSEA MEDICATION  *UNUSUAL SHORTNESS OF BREATH  *UNUSUAL BRUISING OR BLEEDING  TENDERNESS IN MOUTH AND THROAT WITH OR WITHOUT PRESENCE OF ULCERS  *URINARY PROBLEMS  *BOWEL PROBLEMS  UNUSUAL RASH Items with * indicate a potential emergency and should be followed up as soon as possible.  Feel free to call the clinic should you have any questions or concerns. The clinic phone number is [phone].  Please show the CHEMO ALERT CARD at check-in to the Emergency Department and triage nurse.   

## 2018-05-17 ENCOUNTER — Telehealth: Payer: Self-pay | Admitting: Physician Assistant

## 2018-05-17 NOTE — Telephone Encounter (Signed)
Scheduled appt per 5/07 los - added additional cycles - pt to get an updated schedule next visit .

## 2018-05-30 ENCOUNTER — Inpatient Hospital Stay: Payer: Medicare Other

## 2018-05-30 ENCOUNTER — Encounter: Payer: Self-pay | Admitting: Physician Assistant

## 2018-05-30 ENCOUNTER — Inpatient Hospital Stay (HOSPITAL_BASED_OUTPATIENT_CLINIC_OR_DEPARTMENT_OTHER): Payer: Medicare Other | Admitting: Physician Assistant

## 2018-05-30 ENCOUNTER — Other Ambulatory Visit: Payer: Self-pay

## 2018-05-30 VITALS — BP 112/60 | HR 73 | Temp 97.7°F | Resp 16 | Ht 61.0 in | Wt 75.7 lb

## 2018-05-30 DIAGNOSIS — M4854XA Collapsed vertebra, not elsewhere classified, thoracic region, initial encounter for fracture: Secondary | ICD-10-CM | POA: Diagnosis not present

## 2018-05-30 DIAGNOSIS — C3492 Malignant neoplasm of unspecified part of left bronchus or lung: Secondary | ICD-10-CM

## 2018-05-30 DIAGNOSIS — M546 Pain in thoracic spine: Secondary | ICD-10-CM

## 2018-05-30 DIAGNOSIS — Z880 Allergy status to penicillin: Secondary | ICD-10-CM

## 2018-05-30 DIAGNOSIS — C3432 Malignant neoplasm of lower lobe, left bronchus or lung: Secondary | ICD-10-CM | POA: Diagnosis not present

## 2018-05-30 DIAGNOSIS — R21 Rash and other nonspecific skin eruption: Secondary | ICD-10-CM

## 2018-05-30 DIAGNOSIS — Z5112 Encounter for antineoplastic immunotherapy: Secondary | ICD-10-CM | POA: Diagnosis not present

## 2018-05-30 DIAGNOSIS — Z79899 Other long term (current) drug therapy: Secondary | ICD-10-CM

## 2018-05-30 DIAGNOSIS — M1990 Unspecified osteoarthritis, unspecified site: Secondary | ICD-10-CM

## 2018-05-30 DIAGNOSIS — K130 Diseases of lips: Secondary | ICD-10-CM

## 2018-05-30 DIAGNOSIS — J449 Chronic obstructive pulmonary disease, unspecified: Secondary | ICD-10-CM

## 2018-05-30 LAB — CBC WITH DIFFERENTIAL (CANCER CENTER ONLY)
Abs Immature Granulocytes: 0.01 10*3/uL (ref 0.00–0.07)
Basophils Absolute: 0.1 10*3/uL (ref 0.0–0.1)
Basophils Relative: 1 %
Eosinophils Absolute: 0.1 10*3/uL (ref 0.0–0.5)
Eosinophils Relative: 2 %
HCT: 37.2 % (ref 36.0–46.0)
Hemoglobin: 11.4 g/dL — ABNORMAL LOW (ref 12.0–15.0)
Immature Granulocytes: 0 %
Lymphocytes Relative: 21 %
Lymphs Abs: 1 10*3/uL (ref 0.7–4.0)
MCH: 28.5 pg (ref 26.0–34.0)
MCHC: 30.6 g/dL (ref 30.0–36.0)
MCV: 93 fL (ref 80.0–100.0)
Monocytes Absolute: 0.5 10*3/uL (ref 0.1–1.0)
Monocytes Relative: 11 %
Neutro Abs: 3 10*3/uL (ref 1.7–7.7)
Neutrophils Relative %: 65 %
Platelet Count: 350 10*3/uL (ref 150–400)
RBC: 4 MIL/uL (ref 3.87–5.11)
RDW: 15.5 % (ref 11.5–15.5)
WBC Count: 4.8 10*3/uL (ref 4.0–10.5)
nRBC: 0 % (ref 0.0–0.2)

## 2018-05-30 LAB — CMP (CANCER CENTER ONLY)
ALT: 10 U/L (ref 0–44)
AST: 15 U/L (ref 15–41)
Albumin: 3.7 g/dL (ref 3.5–5.0)
Alkaline Phosphatase: 132 U/L — ABNORMAL HIGH (ref 38–126)
Anion gap: 10 (ref 5–15)
BUN: 12 mg/dL (ref 8–23)
CO2: 26 mmol/L (ref 22–32)
Calcium: 9 mg/dL (ref 8.9–10.3)
Chloride: 105 mmol/L (ref 98–111)
Creatinine: 0.75 mg/dL (ref 0.44–1.00)
GFR, Est AFR Am: >60 mL/min (ref >60)
GFR, Estimated: >60 mL/min (ref >60)
Glucose, Bld: 81 mg/dL (ref 70–99)
Potassium: 3.7 mmol/L (ref 3.5–5.1)
Sodium: 141 mmol/L (ref 135–145)
Total Bilirubin: 0.3 mg/dL (ref 0.3–1.2)
Total Protein: 7.2 g/dL (ref 6.5–8.1)

## 2018-05-30 MED ORDER — SODIUM CHLORIDE 0.9 % IV SOLN
Freq: Once | INTRAVENOUS | Status: AC
  Administered 2018-05-30: 15:00:00 via INTRAVENOUS
  Filled 2018-05-30: qty 250

## 2018-05-30 MED ORDER — SODIUM CHLORIDE 0.9 % IV SOLN
10.5000 mg/kg | Freq: Once | INTRAVENOUS | Status: AC
  Administered 2018-05-30: 15:00:00 360 mg via INTRAVENOUS
  Filled 2018-05-30: qty 7.2

## 2018-05-30 NOTE — Patient Instructions (Signed)
[facility] Discharge Instructions for Patients Receiving Chemotherapy  Today you received the following chemotherapy agents: Imfinzi.  To help prevent nausea and vomiting after your treatment, we encourage you to take your nausea medication as directed.   If you develop nausea and vomiting that is not controlled by your nausea medication, call the clinic.   BELOW ARE SYMPTOMS THAT SHOULD BE REPORTED IMMEDIATELY:  *FEVER GREATER THAN 100.5 F  *CHILLS WITH OR WITHOUT FEVER  NAUSEA AND VOMITING THAT IS NOT CONTROLLED WITH YOUR NAUSEA MEDICATION  *UNUSUAL SHORTNESS OF BREATH  *UNUSUAL BRUISING OR BLEEDING  TENDERNESS IN MOUTH AND THROAT WITH OR WITHOUT PRESENCE OF ULCERS  *URINARY PROBLEMS  *BOWEL PROBLEMS  UNUSUAL RASH Items with * indicate a potential emergency and should be followed up as soon as possible.  Feel free to call the clinic should you have any questions or concerns. The clinic phone number is [phone].  Please show the CHEMO ALERT CARD at check-in to the Emergency Department and triage nurse.   

## 2018-05-30 NOTE — Progress Notes (Signed)
[facility] OFFICE PROGRESS NOTE  [name], NP [place] [address]  DIAGNOSIS: Stage IIb/IIIa (T3, N0/N1, M0)non-small cell lung cancer poorly differentiated squamous cell carcinoma diagnosed in August 2019. The patient presented with large superior segment leftlower lobe mass with questionable left hilar adenopathy.  PRIOR THERAPY: Concurrent chemoradiation with weekly carboplatin for AUC of 2 and paclitaxel 45 mg/M2. Status post 7 cycles. Last dose was given October 29, 2017 with partial response.  CURRENT THERAPY: Consolidation treatment with immunotherapy with Imfinzi 10 mg/KG every 2 weeks to start December 13, 2017. Status post 11cycles.  INTERVAL HISTORY: Sherry [name] 73 y.o. female returns to the clinic for a follow-up visit.  The patient is feeling well today without any concerning complaints except for continued thoracic back pain secondary to her compression fracture from her osteoporosis.  She is currently taking hydrocodone which is prescribed by her PCP. She also has been using gabapentin and Salonpas patches with some relief.  Regarding her treatment with immunotherapy, she is tolerating treatment without any adverse effects except for a few small red skin rashes which are non pruritic and non tender.  She denies any fever, chills, night sweats, or weight loss.  She denies any chest pain, shortness of breath, cough, or hemoptysis.  She denies any nausea, vomiting, diarrhea, or constipation.  She denies any headache or visual changes. She is here today for evaluation before starting cycle #12 of immunotherapy today as scheduled.  MEDICAL HISTORY: Past Medical History:  Diagnosis Date  . Allergy   . Anxiety   . Arthritis   . Cavitating mass in left lower lung lobe   . Complication of anesthesia    woke up during procedure  . COPD (chronic obstructive pulmonary disease) ([place])   . Depression   . Family history of adverse  reaction to anesthesia    " my mother was always hard to wake up"  . Full dentures   . Headache   . Neuromuscular disorder ([place])   . Pneumonia   . Thoracic spine fracture ([place]) 11/01/2017   T9  . Wears glasses     ALLERGIES:  is allergic to penicillins.  MEDICATIONS:  Current Outpatient Medications  Medication Sig Dispense Refill  . acetaminophen (TYLENOL 8 HOUR) 650 MG CR tablet Take 1 tablet (650 mg total) by mouth every 8 (eight) hours as needed for pain or fever. 30 tablet 0  . cyanocobalamin (,VITAMIN B-12,) 1000 MCG/ML injection Inject 1,000 mcg into the muscle every 30 (thirty) days. Administered by Dr. Eldridge Abrahams  - every other month (per pt)    . gabapentin (NEURONTIN) 100 MG capsule Take 1 capsule (100 mg total) by mouth 3 (three) times daily. Take 1 tablet once a day 90 capsule 2  . guaifenesin (ROBITUSSIN) 100 MG/5ML syrup Take 200 mg by mouth 3 (three) times daily as needed for cough.     . Menthol-Ascorbic Acid (LUDENS COUGH DROPS MT) Use as directed 1 lozenge in the mouth or throat 3 (three) times daily as needed (cough).     . Multiple Vitamin (MULTIVITAMIN WITH MINERALS) TABS tablet Take 1 tablet by mouth daily.    Marland Kitchen oxyCODONE-acetaminophen (PERCOCET/ROXICET) 5-325 MG tablet Take 1 tablet by mouth every 8 (eight) hours as needed for severe pain. 20 tablet 0  . prochlorperazine (COMPAZINE) 10 MG tablet Take 1 tablet (10 mg total) by mouth every 6 (six) hours as needed for nausea or vomiting. 30 tablet  0  . alendronate (FOSAMAX) 70 MG tablet Take 70 mg by mouth once a week.     No current facility-administered medications for this visit.     SURGICAL HISTORY:  Past Surgical History:  Procedure Laterality Date  . ELBOW SURGERY     for  "Tennis Elbow"  . hand surgery    . IR KYPHO THORACIC WITH BONE BIOPSY  11/27/2017  . IR KYPHO THORACIC WITH BONE BIOPSY  03/01/2018  . IR KYPHO THORACIC WITH BONE BIOPSY  03/26/2018  . IR RADIOLOGIST EVAL & MGMT  11/20/2017  . IR  RADIOLOGIST EVAL & MGMT  02/26/2018  . IR RADIOLOGIST EVAL & MGMT  03/13/2018  . MULTIPLE TOOTH EXTRACTIONS    . TUBAL LIGATION    . VIDEO BRONCHOSCOPY N/A 08/31/2017   Procedure: VIDEO BRONCHOSCOPY;  Surgeon: Melrose Nakayama, MD;  Location: [facility] OR;  Service: Thoracic;  Laterality: N/A;    REVIEW OF SYSTEMS:   Review of Systems  Constitutional: Negative for appetite change, chills, fatigue, fever and unexpected weight change.  HENT: Positive for angular cheilitis. Negative for mouth sores, nosebleeds, sore throat and trouble swallowing.   Eyes: Negative for eye problems and icterus.  Respiratory: Negative for cough, hemoptysis, shortness of breath and wheezing.   Cardiovascular: Negative for chest pain and leg swelling.  Gastrointestinal: Negative for abdominal pain, constipation, diarrhea, nausea and vomiting.  Genitourinary: Negative for bladder incontinence, difficulty urinating, dysuria, frequency and hematuria.   Musculoskeletal: Positive for back pain secondary to her compression fracture. Negative for gait problem, neck pain and neck stiffness.  Skin: Positive for a few small skin lesions. Negative for itching. Neurological: Negative for dizziness, extremity weakness, gait problem, headaches, light-headedness and seizures.  Hematological: Negative for adenopathy. Does not bruise/bleed easily.  Psychiatric/Behavioral: Negative for confusion, depression and sleep disturbance. The patient is not nervous/anxious.     PHYSICAL EXAMINATION:  Blood pressure 112/60, pulse 73, temperature 97.7 F (36.5 C), temperature source Oral, resp. rate 16, height 5\' 1"  (1.549 m), weight 75 lb 11.2 oz (34.3 kg), SpO2 93 %.  ECOG PERFORMANCE STATUS: 1 - Symptomatic but completely ambulatory  Physical Exam  Constitutional: Oriented to person, place, and time and well-developed, well-nourished, and in no distress.  HENT:  Head: Normocephalic and atraumatic.  Mouth/Throat: Oropharynx is clear and  moist. No oropharyngeal exudate.  Eyes: Conjunctivae are normal. Right eye exhibits no discharge. Left eye exhibits no discharge. No scleral icterus.  Neck: Normal range of motion. Neck supple.  Cardiovascular: Normal rate, regular rhythm, normal heart sounds and intact distal pulses.   Pulmonary/Chest: Diffuse wheezing in all lung fields. Effort normal and breath sounds normal. No respiratory distress. No wheezes. No rales.  Abdominal: Soft. Bowel sounds are normal. Exhibits no distension and no mass. There is no tenderness.  Musculoskeletal: Normal range of motion. Exhibits no edema.  Lymphadenopathy:    No cervical adenopathy.  Neurological: Alert and oriented to person, place, and time. Exhibits normal muscle tone. Gait normal. Coordination normal.  Skin: Skin is warm and dry. No rash noted. Not diaphoretic. No erythema. No pallor.  Psychiatric: Mood, memory and judgment normal.  Vitals reviewed.  LABORATORY DATA: Lab Results  Component Value Date   WBC 4.8 05/30/2018   HGB 11.4 (L) 05/30/2018   HCT 37.2 05/30/2018   MCV 93.0 05/30/2018   PLT 350 05/30/2018      Chemistry      Component Value Date/Time   NA 141 05/30/2018 1250   K 3.7  05/30/2018 1250   CL 105 05/30/2018 1250   CO2 26 05/30/2018 1250   BUN 12 05/30/2018 1250   CREATININE 0.75 05/30/2018 1250      Component Value Date/Time   CALCIUM 9.0 05/30/2018 1250   ALKPHOS 132 (H) 05/30/2018 1250   AST 15 05/30/2018 1250   ALT 10 05/30/2018 1250   BILITOT 0.3 05/30/2018 1250       RADIOGRAPHIC STUDIES:  No results found.   ASSESSMENT/PLAN:  This is a very pleasant 73 year old Caucasian female with stage IIIa non-small cell lung cancer, poorly differentiated squamous cell carcinoma.  She presented with a large superior segment of the left lower lobe mass with questionable left hilar adenopathy.  She was diagnosed in August 2019.  The patient underwent a course of concurrent chemoradiation with carboplatin for  an AUC of 2 and paclitaxel 45 mg/m.  Status post 7 cycles with a partial response.  The patient is currently undergoing consolidation immunotherapy with Imfinzi 10 mg/kg IV every 2 weeks.  She is status post 11 cycles of treatment.  She continues to tolerate treatment well without any concerning adverse effects.   The patient was seen with Dr. Julien Nordmann today.  Labs were reviewed with the patient.  We recommend that she proceed with cycle #12 today as scheduled. I will arrange for restaging CT scan to be performed prior to her next appointment. I will see the patient back for a follow-up visit in 2 weeks for evaluation and to review her scan results before starting cycle #13. She will continue on her current pain regimen as outlined by her PCP. She is taking hydrocodone and gabapentin.  For the angular cheilitis and dry skin, I advised the patient to keep the area moist with chapstick and/or Vaseline.  The patient was advised to call immediately if she has any concerning symptoms in the interval. The patient voices understanding of current disease status and treatment options and is in agreement with the current care plan. All questions were answered. The patient knows to call the clinic with any problems, questions or concerns. We can certainly see the patient much sooner if necessary  Orders Placed This Encounter  Procedures  . CT Chest W Contrast    Standing Status:   Future    Standing Expiration Date:   05/30/2019    Order Specific Question:   ** REASON FOR EXAM (FREE TEXT)    Answer:   Restaging Lung Cancer    Order Specific Question:   If indicated for the ordered procedure, I authorize the administration of contrast media per Radiology protocol    Answer:   Yes    Order Specific Question:   Preferred imaging location?    Answer:   [facility]    Order Specific Question:   Radiology Contrast Protocol - do NOT remove file path    Answer:    [email or web address]     Omarr Hann L Alvar Malinoski, PA-C 05/30/18  ADDENDUM: Hematology/Oncology Attending: I had a face-to-face encounter with the patient.  I recommended her care plan.  This is a pleasant 73 years old white female with stage IIIa non-small cell lung cancer status post a course of concurrent chemoradiation with weekly carboplatin and paclitaxel with partial response and she is currently undergoing consolidation treatment with immunotherapy with Imfinzi status post 11 cycles.  The patient continues to tolerate this treatment well. Her only complaint is the persistent low back from osteoporosis and compression fraction. I recommended for the patient to continue with  cycle #12 today as planned. I will see her back for follow-up visit in 3 weeks for evaluation after repeating CT scan of the chest for restaging of her disease. She was advised to call immediately if she has any concerning symptoms in the interval.  Disclaimer: This note was dictated with voice recognition software. Similar sounding words can inadvertently be transcribed and may be missed upon review. Eilleen Kempf, MD 05/30/18

## 2018-06-11 ENCOUNTER — Other Ambulatory Visit: Payer: Self-pay

## 2018-06-11 ENCOUNTER — Ambulatory Visit (HOSPITAL_COMMUNITY)
Admission: RE | Admit: 2018-06-11 | Discharge: 2018-06-11 | Disposition: A | Discharge status: Home / Self Care | Payer: Medicare Other | Source: Ambulatory Visit | Attending: Physician Assistant | Admitting: Physician Assistant

## 2018-06-11 DIAGNOSIS — C3492 Malignant neoplasm of unspecified part of left bronchus or lung: Secondary | ICD-10-CM | POA: Diagnosis present

## 2018-06-11 MED ORDER — SODIUM CHLORIDE (PF) 0.9 % IJ SOLN
INTRAMUSCULAR | Status: AC
  Filled 2018-06-11: qty 50

## 2018-06-11 MED ORDER — IOHEXOL 300 MG/ML  SOLN
75.0000 mL | Freq: Once | INTRAMUSCULAR | Status: AC | PRN
  Administered 2018-06-11: 75 mL via INTRAVENOUS

## 2018-06-11 NOTE — Progress Notes (Signed)
Patient had late reaction to contrast of itching and hives, will need premeds in the future

## 2018-06-13 ENCOUNTER — Inpatient Hospital Stay (HOSPITAL_BASED_OUTPATIENT_CLINIC_OR_DEPARTMENT_OTHER): Payer: Medicare Other | Admitting: Internal Medicine

## 2018-06-13 ENCOUNTER — Other Ambulatory Visit: Payer: Self-pay

## 2018-06-13 ENCOUNTER — Inpatient Hospital Stay: Payer: Medicare Other

## 2018-06-13 ENCOUNTER — Inpatient Hospital Stay: Payer: Medicare Other | Attending: Physician Assistant

## 2018-06-13 ENCOUNTER — Encounter: Payer: Self-pay | Admitting: Internal Medicine

## 2018-06-13 ENCOUNTER — Other Ambulatory Visit: Payer: Self-pay | Admitting: Internal Medicine

## 2018-06-13 VITALS — BP 135/74 | HR 75 | Temp 97.4°F | Resp 18 | Ht 61.0 in | Wt 75.1 lb

## 2018-06-13 DIAGNOSIS — M546 Pain in thoracic spine: Secondary | ICD-10-CM | POA: Diagnosis not present

## 2018-06-13 DIAGNOSIS — Z5112 Encounter for antineoplastic immunotherapy: Secondary | ICD-10-CM | POA: Diagnosis not present

## 2018-06-13 DIAGNOSIS — Z79899 Other long term (current) drug therapy: Secondary | ICD-10-CM | POA: Diagnosis not present

## 2018-06-13 DIAGNOSIS — C3432 Malignant neoplasm of lower lobe, left bronchus or lung: Secondary | ICD-10-CM | POA: Insufficient documentation

## 2018-06-13 DIAGNOSIS — E041 Nontoxic single thyroid nodule: Secondary | ICD-10-CM | POA: Diagnosis not present

## 2018-06-13 DIAGNOSIS — M1990 Unspecified osteoarthritis, unspecified site: Secondary | ICD-10-CM | POA: Diagnosis not present

## 2018-06-13 DIAGNOSIS — M545 Low back pain: Secondary | ICD-10-CM | POA: Insufficient documentation

## 2018-06-13 DIAGNOSIS — M810 Age-related osteoporosis without current pathological fracture: Secondary | ICD-10-CM | POA: Diagnosis not present

## 2018-06-13 DIAGNOSIS — J449 Chronic obstructive pulmonary disease, unspecified: Secondary | ICD-10-CM | POA: Insufficient documentation

## 2018-06-13 DIAGNOSIS — C3492 Malignant neoplasm of unspecified part of left bronchus or lung: Secondary | ICD-10-CM

## 2018-06-13 DIAGNOSIS — J90 Pleural effusion, not elsewhere classified: Secondary | ICD-10-CM

## 2018-06-13 DIAGNOSIS — I700 Atherosclerosis of aorta: Secondary | ICD-10-CM

## 2018-06-13 DIAGNOSIS — Z880 Allergy status to penicillin: Secondary | ICD-10-CM

## 2018-06-13 LAB — CBC WITH DIFFERENTIAL (CANCER CENTER ONLY)
Abs Immature Granulocytes: 0.01 10*3/uL (ref 0.00–0.07)
Basophils Absolute: 0.1 10*3/uL (ref 0.0–0.1)
Basophils Relative: 1 %
Eosinophils Absolute: 0.1 10*3/uL (ref 0.0–0.5)
Eosinophils Relative: 3 %
HCT: 38.3 % (ref 36.0–46.0)
Hemoglobin: 11.7 g/dL — ABNORMAL LOW (ref 12.0–15.0)
Immature Granulocytes: 0 %
Lymphocytes Relative: 20 %
Lymphs Abs: 1 10*3/uL (ref 0.7–4.0)
MCH: 28.8 pg (ref 26.0–34.0)
MCHC: 30.5 g/dL (ref 30.0–36.0)
MCV: 94.3 fL (ref 80.0–100.0)
Monocytes Absolute: 0.5 10*3/uL (ref 0.1–1.0)
Monocytes Relative: 10 %
Neutro Abs: 3.1 10*3/uL (ref 1.7–7.7)
Neutrophils Relative %: 66 %
Platelet Count: 268 10*3/uL (ref 150–400)
RBC: 4.06 MIL/uL (ref 3.87–5.11)
RDW: 14.8 % (ref 11.5–15.5)
WBC Count: 4.7 10*3/uL (ref 4.0–10.5)
nRBC: 0 % (ref 0.0–0.2)

## 2018-06-13 LAB — CMP (CANCER CENTER ONLY)
ALT: 7 U/L (ref 0–44)
AST: 18 U/L (ref 15–41)
Albumin: 3.7 g/dL (ref 3.5–5.0)
Alkaline Phosphatase: 119 U/L (ref 38–126)
Anion gap: 11 (ref 5–15)
BUN: 10 mg/dL (ref 8–23)
CO2: 22 mmol/L (ref 22–32)
Calcium: 8.8 mg/dL — ABNORMAL LOW (ref 8.9–10.3)
Chloride: 108 mmol/L (ref 98–111)
Creatinine: 0.73 mg/dL (ref 0.44–1.00)
GFR, Est AFR Am: >60 mL/min (ref >60)
GFR, Estimated: >60 mL/min (ref >60)
Glucose, Bld: 86 mg/dL (ref 70–99)
Potassium: 3.7 mmol/L (ref 3.5–5.1)
Sodium: 141 mmol/L (ref 135–145)
Total Bilirubin: 0.4 mg/dL (ref 0.3–1.2)
Total Protein: 7 g/dL (ref 6.5–8.1)

## 2018-06-13 LAB — TSH: TSH: 1.743 u[IU]/mL (ref 0.308–3.960)

## 2018-06-13 MED ORDER — SODIUM CHLORIDE 0.9 % IV SOLN
360.0000 mg | Freq: Once | INTRAVENOUS | Status: AC
  Administered 2018-06-13: 360 mg via INTRAVENOUS
  Filled 2018-06-13: qty 7.2

## 2018-06-13 MED ORDER — SODIUM CHLORIDE 0.9 % IV SOLN
Freq: Once | INTRAVENOUS | Status: AC
  Administered 2018-06-13: 13:00:00 via INTRAVENOUS
  Filled 2018-06-13: qty 250

## 2018-06-13 NOTE — Progress Notes (Signed)
[facility] [phone]   Fax:([phone]) [phone]  OFFICE PROGRESS NOTE  [name], NP [place] [address]  DIAGNOSIS: Stage IIb/IIIa (T3, N0/N1, M0) non-small cell lung cancer poorly differentiated squamous cell carcinoma diagnosed in August 2019.  The patient presented with large superior segment left lower lobe mass with questionable left hilar adenopathy.  PRIOR THERAPY: Concurrent chemoradiation with weekly carboplatin for AUC of 2 and paclitaxel 45 mg/M2.  Status post 7 cycles.  Last dose was given October 29, 2017 with partial response.  CURRENT THERAPY: Consolidation treatment with immunotherapy with Imfinzi 10 mg/KG every 2 weeks to start December 13, 2017.  Status post 12 cycles.  INTERVAL HISTORY: Sherry [name] 73 y.o. female returns to the clinic today for follow-up visit.  The patient is feeling fine today with no concerning complaints except for the persistent low back pain from compression fracture.  She denied having any chest pain, shortness of breath, cough or hemoptysis.  She denied having any fever or chills.  She has no nausea, vomiting, diarrhea or constipation.  She denied having any headache or visual changes.  She continues to tolerate her treatment with Imfinzi fairly well.  The patient had repeat CT scan of the chest performed recently and she is here for evaluation and discussion of her scan results.   MEDICAL HISTORY: Past Medical History:  Diagnosis Date  . Allergy   . Anxiety   . Arthritis   . Cavitating mass in left lower lung lobe   . Complication of anesthesia    woke up during procedure  . COPD (chronic obstructive pulmonary disease) ([place])   . Depression   . Family history of adverse reaction to anesthesia    " my mother was always hard to wake up"  . Full dentures   . Headache   . Neuromuscular disorder ([place])   . Pneumonia   . Thoracic spine fracture ([place]) 11/01/2017   T9  . Wears  glasses     ALLERGIES:  is allergic to omnipaque [iohexol] and penicillins.  MEDICATIONS:  Current Outpatient Medications  Medication Sig Dispense Refill  . acetaminophen (TYLENOL 8 HOUR) 650 MG CR tablet Take 1 tablet (650 mg total) by mouth every 8 (eight) hours as needed for pain or fever. 30 tablet 0  . alendronate (FOSAMAX) 70 MG tablet Take 70 mg by mouth once a week.    . cyanocobalamin (,VITAMIN B-12,) 1000 MCG/ML injection Inject 1,000 mcg into the muscle every 30 (thirty) days. Administered by Dr. Eldridge Abrahams  - every other month (per pt)    . gabapentin (NEURONTIN) 100 MG capsule Take 1 capsule (100 mg total) by mouth 3 (three) times daily. Take 1 tablet once a day 90 capsule 2  . guaifenesin (ROBITUSSIN) 100 MG/5ML syrup Take 200 mg by mouth 3 (three) times daily as needed for cough.     . Menthol-Ascorbic Acid (LUDENS COUGH DROPS MT) Use as directed 1 lozenge in the mouth or throat 3 (three) times daily as needed (cough).     . Multiple Vitamin (MULTIVITAMIN WITH MINERALS) TABS tablet Take 1 tablet by mouth daily.    Marland Kitchen oxyCODONE-acetaminophen (PERCOCET/ROXICET) 5-325 MG tablet Take 1 tablet by mouth every 8 (eight) hours as needed for severe pain. 20 tablet 0  . prochlorperazine (COMPAZINE) 10 MG tablet Take 1 tablet (10 mg total) by mouth every 6 (six) hours as needed for nausea or vomiting. [place]  tablet 0   No current facility-administered medications for this visit.     SURGICAL HISTORY:  Past Surgical History:  Procedure Laterality Date  . ELBOW SURGERY     for  "Tennis Elbow"  . hand surgery    . IR KYPHO THORACIC WITH BONE BIOPSY  11/27/2017  . IR KYPHO THORACIC WITH BONE BIOPSY  03/01/2018  . IR KYPHO THORACIC WITH BONE BIOPSY  03/26/2018  . IR RADIOLOGIST EVAL & MGMT  11/20/2017  . IR RADIOLOGIST EVAL & MGMT  02/26/2018  . IR RADIOLOGIST EVAL & MGMT  03/13/2018  . MULTIPLE TOOTH EXTRACTIONS    . TUBAL LIGATION    . VIDEO BRONCHOSCOPY N/A 08/31/2017   Procedure: VIDEO  BRONCHOSCOPY;  Surgeon: Melrose Nakayama, MD;  Location: [facility];  Service: Thoracic;  Laterality: N/A;    REVIEW OF SYSTEMS:  Constitutional: negative Eyes: negative Ears, nose, mouth, throat, and face: negative Respiratory: negative Cardiovascular: negative Gastrointestinal: negative Genitourinary:negative Integument/breast: negative Hematologic/lymphatic: negative Musculoskeletal:positive for back pain Neurological: negative Behavioral/Psych: negative Endocrine: negative Allergic/Immunologic: negative   PHYSICAL EXAMINATION: General appearance: alert, cooperative and no distress Head: Normocephalic, without obvious abnormality, atraumatic Neck: no adenopathy, no JVD, supple, symmetrical, trachea midline and thyroid not enlarged, symmetric, no tenderness/mass/nodules Lymph nodes: Cervical, supraclavicular, and axillary nodes normal. Resp: clear to auscultation bilaterally Back: symmetric, no curvature. ROM normal. No CVA tenderness. Cardio: regular rate and rhythm, S1, S2 normal, no murmur, click, rub or gallop GI: soft, non-tender; bowel sounds normal; no masses,  no organomegaly Extremities: extremities normal, atraumatic, no cyanosis or edema Neurologic: Alert and oriented X 3, normal strength and tone. Normal symmetric reflexes. Normal coordination and gait  ECOG PERFORMANCE STATUS: 1 - Symptomatic but completely ambulatory  Blood pressure 135/74, pulse 75, temperature (!) 97.4 F (36.3 C), temperature source Oral, resp. rate 18, height 5\' 1"  (1.549 m), weight 75 lb 1.6 oz (34.1 kg), SpO2 98 %.  LABORATORY DATA: Lab Results  Component Value Date   WBC 4.7 06/13/2018   HGB 11.7 (L) 06/13/2018   HCT 38.3 06/13/2018   MCV 94.3 06/13/2018   PLT 268 06/13/2018      Chemistry      Component Value Date/Time   NA 141 05/30/2018 1250   K 3.7 05/30/2018 1250   CL 105 05/30/2018 1250   CO2 26 05/30/2018 1250   BUN 12 05/30/2018 1250   CREATININE 0.75 05/30/2018 1250       Component Value Date/Time   CALCIUM 9.0 05/30/2018 1250   ALKPHOS 132 (H) 05/30/2018 1250   AST 15 05/30/2018 1250   ALT 10 05/30/2018 1250   BILITOT 0.3 05/30/2018 1250       RADIOGRAPHIC STUDIES: Ct Chest W Contrast  Result Date: 06/12/2018 CLINICAL DATA:  Left lung cancer, diagnosed July 2019, status post chemotherapy and XRT, immunotherapy ongoing EXAM: CT CHEST WITH CONTRAST TECHNIQUE: Multidetector CT imaging of the chest was performed during intravenous contrast administration. CONTRAST:  32mL OMNIPAQUE IOHEXOL 300 MG/ML  SOLN COMPARISON:  03/19/2018 FINDINGS: Cardiovascular: Heart is normal in size.  No pericardial effusion. No evidence of thoracic aortic aneurysm. Mild atherosclerotic calcifications of the aortic arch. Mild coronary atherosclerosis of the LAD. Mediastinum/Nodes: No suspicious mediastinal lymphadenopathy. 8 mm short axis AP window node. Subcentimeter right thyroid nodule, benign. Lungs/Pleura: Radiation changes in the medial left lower lobe. Stable 7 mm right lower lobe nodule (series 5/image 59). Moderate centrilobular and paraseptal emphysematous changes. Biapical pleural-parenchymal scarring. Small left pleural effusion, partially loculated.  No  pneumothorax. Upper Abdomen: Visualized upper abdomen is unremarkable, noting vascular calcifications. Musculoskeletal: Prior vertebral augmentation at T10-12. IMPRESSION: Radiation changes in the medial left lower lobe. No findings specific for recurrent or metastatic disease. 7 mm right lower lobe nodule, unchanged, likely benign. Continued attention on follow-up is suggested. Aortic Atherosclerosis (ICD10-I70.0) and Emphysema (ICD10-J43.9). Electronically Signed   By: Julian Hy M.D.   On: 06/12/2018 10:43    ASSESSMENT AND PLAN: This is a very pleasant 73 years old white female with a stage IIIa non-small cell lung cancer, poorly differentiated squamous cell carcinoma.   The patient underwent a course of concurrent  chemoradiation with weekly carboplatin and paclitaxel status post 7 cycles with partial response. The patient is currently undergoing consolidation treatment with immunotherapy with Imfinzi status post 12 cycles.  The patient continues to tolerate her treatment well with no concerning adverse effects. She had repeat CT scan of the chest performed recently.  I personally and independently reviewed the scan images and discussed the results with the patient today. Her scan showed no concerning findings for disease progression. I recommended for her to proceed with cycle 13 of her treatment with immunotherapy today. She will come back for follow-up visit in 2 weeks for evaluation before the next cycle of her treatment. She was advised to call immediately if she has any concerning symptoms in the interval. The patient voices understanding of current disease status and treatment options and is in agreement with the current care plan. All questions were answered. The patient knows to call the clinic with any problems, questions or concerns. We can certainly see the patient much sooner if necessary.  Disclaimer: This note was dictated with voice recognition software. Similar sounding words can inadvertently be transcribed and may not be corrected upon review.

## 2018-06-13 NOTE — Patient Instructions (Signed)
[facility] Discharge Instructions for Patients Receiving Chemotherapy  Today you received the following chemotherapy agents: Imfinzi.  To help prevent nausea and vomiting after your treatment, we encourage you to take your nausea medication as directed.   If you develop nausea and vomiting that is not controlled by your nausea medication, call the clinic.   BELOW ARE SYMPTOMS THAT SHOULD BE REPORTED IMMEDIATELY:  *FEVER GREATER THAN 100.5 F  *CHILLS WITH OR WITHOUT FEVER  NAUSEA AND VOMITING THAT IS NOT CONTROLLED WITH YOUR NAUSEA MEDICATION  *UNUSUAL SHORTNESS OF BREATH  *UNUSUAL BRUISING OR BLEEDING  TENDERNESS IN MOUTH AND THROAT WITH OR WITHOUT PRESENCE OF ULCERS  *URINARY PROBLEMS  *BOWEL PROBLEMS  UNUSUAL RASH Items with * indicate a potential emergency and should be followed up as soon as possible.  Feel free to call the clinic should you have any questions or concerns. The clinic phone number is [phone].  Please show the CHEMO ALERT CARD at check-in to the Emergency Department and triage nurse.   

## 2018-06-13 NOTE — Progress Notes (Signed)
Per Dr. Julien Nordmann okay to proceed with treatment without CMP results.

## 2018-06-27 ENCOUNTER — Inpatient Hospital Stay (HOSPITAL_BASED_OUTPATIENT_CLINIC_OR_DEPARTMENT_OTHER): Payer: Medicare Other | Admitting: Physician Assistant

## 2018-06-27 ENCOUNTER — Other Ambulatory Visit: Payer: Self-pay

## 2018-06-27 ENCOUNTER — Inpatient Hospital Stay: Payer: Medicare Other

## 2018-06-27 ENCOUNTER — Telehealth: Payer: Self-pay | Admitting: Physician Assistant

## 2018-06-27 VITALS — BP 129/72 | HR 73 | Temp 98.1°F | Resp 18 | Ht 61.0 in | Wt 77.2 lb

## 2018-06-27 DIAGNOSIS — Z5112 Encounter for antineoplastic immunotherapy: Secondary | ICD-10-CM

## 2018-06-27 DIAGNOSIS — E041 Nontoxic single thyroid nodule: Secondary | ICD-10-CM

## 2018-06-27 DIAGNOSIS — Z880 Allergy status to penicillin: Secondary | ICD-10-CM

## 2018-06-27 DIAGNOSIS — M810 Age-related osteoporosis without current pathological fracture: Secondary | ICD-10-CM

## 2018-06-27 DIAGNOSIS — C3432 Malignant neoplasm of lower lobe, left bronchus or lung: Secondary | ICD-10-CM

## 2018-06-27 DIAGNOSIS — M1990 Unspecified osteoarthritis, unspecified site: Secondary | ICD-10-CM

## 2018-06-27 DIAGNOSIS — C3492 Malignant neoplasm of unspecified part of left bronchus or lung: Secondary | ICD-10-CM

## 2018-06-27 DIAGNOSIS — I700 Atherosclerosis of aorta: Secondary | ICD-10-CM

## 2018-06-27 DIAGNOSIS — J90 Pleural effusion, not elsewhere classified: Secondary | ICD-10-CM

## 2018-06-27 DIAGNOSIS — M546 Pain in thoracic spine: Secondary | ICD-10-CM | POA: Diagnosis not present

## 2018-06-27 DIAGNOSIS — Z79899 Other long term (current) drug therapy: Secondary | ICD-10-CM

## 2018-06-27 DIAGNOSIS — J449 Chronic obstructive pulmonary disease, unspecified: Secondary | ICD-10-CM

## 2018-06-27 DIAGNOSIS — M545 Low back pain: Secondary | ICD-10-CM

## 2018-06-27 LAB — CMP (CANCER CENTER ONLY)
ALT: 11 U/L (ref 0–44)
AST: 18 U/L (ref 15–41)
Albumin: 4 g/dL (ref 3.5–5.0)
Alkaline Phosphatase: 129 U/L — ABNORMAL HIGH (ref 38–126)
Anion gap: 12 (ref 5–15)
BUN: 11 mg/dL (ref 8–23)
CO2: 25 mmol/L (ref 22–32)
Calcium: 9 mg/dL (ref 8.9–10.3)
Chloride: 105 mmol/L (ref 98–111)
Creatinine: 0.8 mg/dL (ref 0.44–1.00)
GFR, Est AFR Am: >60 mL/min (ref >60)
GFR, Estimated: >60 mL/min (ref >60)
Glucose, Bld: 82 mg/dL (ref 70–99)
Potassium: 3.6 mmol/L (ref 3.5–5.1)
Sodium: 142 mmol/L (ref 135–145)
Total Bilirubin: 0.4 mg/dL (ref 0.3–1.2)
Total Protein: 7.7 g/dL (ref 6.5–8.1)

## 2018-06-27 LAB — CBC WITH DIFFERENTIAL (CANCER CENTER ONLY)
Abs Immature Granulocytes: 0.01 10*3/uL (ref 0.00–0.07)
Basophils Absolute: 0 10*3/uL (ref 0.0–0.1)
Basophils Relative: 1 %
Eosinophils Absolute: 0.1 10*3/uL (ref 0.0–0.5)
Eosinophils Relative: 2 %
HCT: 39.2 % (ref 36.0–46.0)
Hemoglobin: 12 g/dL (ref 12.0–15.0)
Immature Granulocytes: 0 %
Lymphocytes Relative: 18 %
Lymphs Abs: 0.8 10*3/uL (ref 0.7–4.0)
MCH: 28.8 pg (ref 26.0–34.0)
MCHC: 30.6 g/dL (ref 30.0–36.0)
MCV: 94 fL (ref 80.0–100.0)
Monocytes Absolute: 0.5 10*3/uL (ref 0.1–1.0)
Monocytes Relative: 11 %
Neutro Abs: 3.1 10*3/uL (ref 1.7–7.7)
Neutrophils Relative %: 68 %
Platelet Count: 305 10*3/uL (ref 150–400)
RBC: 4.17 MIL/uL (ref 3.87–5.11)
RDW: 14.6 % (ref 11.5–15.5)
WBC Count: 4.6 10*3/uL (ref 4.0–10.5)
nRBC: 0 % (ref 0.0–0.2)

## 2018-06-27 MED ORDER — SODIUM CHLORIDE 0.9 % IV SOLN
10.7000 mg/kg | Freq: Once | INTRAVENOUS | Status: AC
  Administered 2018-06-27: 360 mg via INTRAVENOUS
  Filled 2018-06-27: qty 7.2

## 2018-06-27 MED ORDER — SODIUM CHLORIDE 0.9 % IV SOLN
Freq: Once | INTRAVENOUS | Status: AC
  Administered 2018-06-27: 15:00:00 via INTRAVENOUS
  Filled 2018-06-27: qty 250

## 2018-06-27 NOTE — Telephone Encounter (Signed)
Scheduled appt per 6/18 los. Printed calendar and avs.

## 2018-06-27 NOTE — Patient Instructions (Signed)
[facility] Discharge Instructions for Patients Receiving Chemotherapy  Today you received the following chemotherapy agents: Durvalumab (Imfinzi)  To help prevent nausea and vomiting after your treatment, we encourage you to take your nausea medication as directed.   If you develop nausea and vomiting that is not controlled by your nausea medication, call the clinic.   BELOW ARE SYMPTOMS THAT SHOULD BE REPORTED IMMEDIATELY:  *FEVER GREATER THAN 100.5 F  *CHILLS WITH OR WITHOUT FEVER  NAUSEA AND VOMITING THAT IS NOT CONTROLLED WITH YOUR NAUSEA MEDICATION  *UNUSUAL SHORTNESS OF BREATH  *UNUSUAL BRUISING OR BLEEDING  TENDERNESS IN MOUTH AND THROAT WITH OR WITHOUT PRESENCE OF ULCERS  *URINARY PROBLEMS  *BOWEL PROBLEMS  UNUSUAL RASH Items with * indicate a potential emergency and should be followed up as soon as possible.  Feel free to call the clinic should you have any questions or concerns. The clinic phone number is [phone].  Please show the [facility] at check-in to the Emergency Department and triage nurse.  Coronavirus (COVID-19) Are you at risk?  Are you at risk for the Coronavirus (COVID-19)?  To be considered HIGH RISK for Coronavirus (COVID-19), you have to meet the following criteria:  . Traveled to [place], [place], [place], [place] or [place]; or in the [place] to [place], [place], [place], or [place]; and have fever, cough, and shortness of breath within the last 2 weeks of travel OR . Been in close contact with a person diagnosed with COVID-19 within the last 2 weeks and have fever, cough, and shortness of breath . IF YOU DO NOT MEET THESE CRITERIA, YOU ARE CONSIDERED LOW RISK FOR COVID-19.  What to do if you are HIGH RISK for COVID-19?  Marland Kitchen If you are having a medical emergency, call 911. . Seek medical care right away. Before you go to a doctor's office, urgent care or emergency department, call ahead and tell them  about your recent travel, contact with someone diagnosed with COVID-19, and your symptoms. You should receive instructions from your physician's office regarding next steps of care.  . When you arrive at healthcare provider, tell the healthcare staff immediately you have returned from visiting [place], [place], [place], [place] or [place]; or traveled in the [place] to [place], [place], [place], or [place]; in the last two weeks or you have been in close contact with a person diagnosed with COVID-19 in the last 2 weeks.   . Tell the health care staff about your symptoms: fever, cough and shortness of breath. . After you have been seen by a medical provider, you will be either: o Tested for (COVID-19) and discharged home on quarantine except to seek medical care if symptoms worsen, and asked to  - Stay home and avoid contact with others until you get your results (4-5 days)  - Avoid travel on public transportation if possible (such as bus, train, or airplane) or o Sent to the Emergency Department by EMS for evaluation, COVID-19 testing, and possible admission depending on your condition and test results.  What to do if you are LOW RISK for COVID-19?  Reduce your risk of any infection by using the same precautions used for avoiding the common cold or flu:  Marland Kitchen Wash your hands often with soap and warm water for at least 20 seconds.  If soap and water are not readily available, use an alcohol-based hand sanitizer with at least 60% alcohol.  . If coughing or  sneezing, cover your mouth and nose by coughing or sneezing into the elbow areas of your shirt or coat, into a tissue or into your sleeve (not your hands). . Avoid shaking hands with others and consider head nods or verbal greetings only. . Avoid touching your eyes, nose, or mouth with unwashed hands.  . Avoid close contact with people who are sick. . Avoid places or events with large numbers of people in one location, like concerts or  sporting events. . Carefully consider travel plans you have or are making. . If you are planning any travel outside or inside the [place], visit the CDC's Travelers' Health webpage for the latest health notices. . If you have some symptoms but not all symptoms, continue to monitor at home and seek medical attention if your symptoms worsen. . If you are having a medical emergency, call 911.   [facility] / e-Visit: [email or web address]         [facility]: [facility]: [phone]                   [facility]: [phone]

## 2018-06-27 NOTE — Progress Notes (Signed)
[facility] OFFICE PROGRESS NOTE  [name], [place] [address]  DIAGNOSIS: Stage IIb/IIIa (T3, N0/N1, M0)non-small cell lung cancer poorly differentiated squamous cell carcinoma diagnosed in August 2019. The patient presented with large superior segment leftlower lobe mass with questionable left hilar adenopathy.  PRIOR THERAPY: Concurrent chemoradiation with weekly carboplatin for AUC of 2 and paclitaxel 45 mg/M2. Status post 7 cycles. Last dose was given October 29, 2017 with partial response.  CURRENT THERAPY: Consolidation treatment with immunotherapy with Imfinzi 10 mg/KG every 2 weeks to start December 13, 2017. Status post13cycles.   INTERVAL HISTORY: Sherry [name] 73 y.o. female returns to the clinic for a follow-up visit.  The patient is feeling well today without any concerning complaints except for continued thoracic back pain secondary to her compression fractures from her osteoporosis.  She is tolerating her treatment with Imfinzi well without any adverse effects.  She denies any fever, chills, night sweats, or weight loss.  She denies any chest pain, shortness of breath, cough, or hemoptysis.  She denies any nausea, vomiting, diarrhea, or constipation.  She denies any headache or visual changes.  She denies any rashes or skin changes.  She is here today for evaluation before starting cycle #14.  MEDICAL HISTORY: Past Medical History:  Diagnosis Date  . Allergy   . Anxiety   . Arthritis   . Cavitating mass in left lower lung lobe   . Complication of anesthesia    woke up during procedure  . COPD (chronic obstructive pulmonary disease) ([place])   . Depression   . Family history of adverse reaction to anesthesia    " my mother was always hard to wake up"  . Full dentures   . Headache   . Neuromuscular disorder ([place])   . Pneumonia   . Thoracic spine fracture ([place]) 11/01/2017   T9  . Wears glasses     ALLERGIES:   is allergic to omnipaque [iohexol] and penicillins.  MEDICATIONS:  Current Outpatient Medications  Medication Sig Dispense Refill  . acetaminophen (TYLENOL 8 HOUR) 650 MG CR tablet Take 1 tablet (650 mg total) by mouth every 8 (eight) hours as needed for pain or fever. 30 tablet 0  . cyanocobalamin (,VITAMIN B-12,) 1000 MCG/ML injection Inject 1,000 mcg into the muscle every 30 (thirty) days. Administered by Dr. [place]ridge Abrahams  - every other month (per pt)    . gabapentin (NEURONTIN) 100 MG capsule Take 1 capsule (100 mg total) by mouth 3 (three) times daily. Take 1 tablet once a day 90 capsule 2  . guaifenesin (ROBITUSSIN) 100 MG/5ML syrup Take 200 mg by mouth 3 (three) times daily as needed for cough.     . Menthol-Ascorbic Acid (LUDENS COUGH DROPS MT) Use as directed 1 lozenge in the mouth or throat 3 (three) times daily as needed (cough).     . Multiple Vitamin (MULTIVITAMIN WITH MINERALS) TABS tablet Take 1 tablet by mouth daily.    Marland Kitchen oxyCODONE-acetaminophen (PERCOCET/ROXICET) 5-325 MG tablet Take 1 tablet by mouth every 8 (eight) hours as needed for severe pain. 20 tablet 0  . prochlorperazine (COMPAZINE) 10 MG tablet Take 1 tablet (10 mg total) by mouth every 6 (six) hours as needed for nausea or vomiting. 30 tablet 0  . alendronate (FOSAMAX) 70 MG tablet Take 70 mg by mouth once a week.     No current facility-administered medications for this visit.    Facility-Administered Medications Ordered  in Other Visits  Medication Dose Route Frequency Provider Last Rate Last Dose  . 0.9 %  sodium chloride infusion   Intravenous Once Curt Bears, MD      . durvalumab (IMFINZI) 360 mg in sodium chloride 0.9 % 100 mL chemo infusion  10.7 mg/kg (Treatment Plan Recorded) Intravenous Once Curt Bears, MD        SURGICAL HISTORY:  Past Surgical History:  Procedure Laterality Date  . ELBOW SURGERY     for  "Tennis Elbow"  . hand surgery    . IR KYPHO THORACIC WITH BONE BIOPSY  11/27/2017   . IR KYPHO THORACIC WITH BONE BIOPSY  03/01/2018  . IR KYPHO THORACIC WITH BONE BIOPSY  03/26/2018  . IR RADIOLOGIST EVAL & MGMT  11/20/2017  . IR RADIOLOGIST EVAL & MGMT  02/26/2018  . IR RADIOLOGIST EVAL & MGMT  03/13/2018  . MULTIPLE TOOTH EXTRACTIONS    . TUBAL LIGATION    . VIDEO BRONCHOSCOPY N/A 08/31/2017   Procedure: VIDEO BRONCHOSCOPY;  Surgeon: Melrose Nakayama, MD;  Location: [facility] OR;  Service: Thoracic;  Laterality: N/A;    REVIEW OF SYSTEMS:   Review of Systems  Constitutional: Negative for appetite change, chills, fatigue, fever and unexpected weight change.  HENT:   Negative for mouth sores, nosebleeds, sore throat and trouble swallowing.   Eyes: Negative for eye problems and icterus.  Respiratory: Negative for cough, hemoptysis, shortness of breath and wheezing.   Cardiovascular: Negative for chest pain and leg swelling.  Gastrointestinal: Negative for abdominal pain, constipation, diarrhea, nausea and vomiting.  Genitourinary: Negative for bladder incontinence, difficulty urinating, dysuria, frequency and hematuria.   Musculoskeletal: Positive for back pain secondary to her compression fracture. Negative for gait problem, neck pain and neck stiffness.  Skin: Negative for itching and rash.  Neurological: Negative for dizziness, extremity weakness, gait problem, headaches, light-headedness and seizures.  Hematological: Negative for adenopathy. Does not bruise/bleed easily.  Psychiatric/Behavioral: Negative for confusion, depression and sleep disturbance. The patient is not nervous/anxious.     PHYSICAL EXAMINATION:  Blood pressure 129/72, pulse 73, temperature 98.1 F (36.7 C), temperature source Oral, resp. rate 18, height 5\' 1"  (1.549 m), weight 77 lb 3.2 oz (35 kg), SpO2 98 %.  ECOG PERFORMANCE STATUS: 1 - Symptomatic but completely ambulatory  Physical Exam  Constitutional: Oriented to person, place, and time and well-developed, well-nourished, and in no distress.   HENT:  Head: Normocephalic and atraumatic.  Mouth/Throat: Oropharynx is clear and moist. No oropharyngeal exudate.  Eyes: Conjunctivae are normal. Right eye exhibits no discharge. Left eye exhibits no discharge. No scleral icterus.  Neck: Normal range of motion. Neck supple.  Cardiovascular: Normal rate, regular rhythm, normal heart sounds and intact distal pulses.   Pulmonary/Chest: Wheezing present. Effort normal and breath sounds normal. No respiratory distress.  No rales.  Abdominal: Soft. Bowel sounds are normal. Exhibits no distension and no mass. There is no tenderness.  Musculoskeletal: Normal range of motion. Exhibits no edema.  Lymphadenopathy:    No cervical adenopathy.  Neurological: Alert and oriented to person, place, and time. Exhibits normal muscle tone. Gait normal. Coordination normal.  Skin: Skin is warm and dry. No rash noted. Not diaphoretic. No erythema. No pallor.  Psychiatric: Mood, memory and judgment normal.  Vitals reviewed.  LABORATORY DATA: Lab Results  Component Value Date   WBC 4.6 06/27/2018   HGB 12.0 06/27/2018   HCT 39.2 06/27/2018   MCV 94.0 06/27/2018   PLT 305 06/27/2018  Chemistry      Component Value Date/Time   NA 142 06/27/2018 1209   K 3.6 06/27/2018 1209   CL 105 06/27/2018 1209   CO2 25 06/27/2018 1209   BUN 11 06/27/2018 1209   CREATININE 0.80 06/27/2018 1209      Component Value Date/Time   CALCIUM 9.0 06/27/2018 1209   ALKPHOS 129 (H) 06/27/2018 1209   AST 18 06/27/2018 1209   ALT 11 06/27/2018 1209   BILITOT 0.4 06/27/2018 1209       RADIOGRAPHIC STUDIES:  Ct Chest W Contrast  Result Date: 06/12/2018 CLINICAL DATA:  Left lung cancer, diagnosed July 2019, status post chemotherapy and XRT, immunotherapy ongoing EXAM: CT CHEST WITH CONTRAST TECHNIQUE: Multidetector CT imaging of the chest was performed during intravenous contrast administration. CONTRAST:  57mL OMNIPAQUE IOHEXOL 300 MG/ML  SOLN COMPARISON:   03/19/2018 FINDINGS: Cardiovascular: Heart is normal in size.  No pericardial effusion. No evidence of thoracic aortic aneurysm. Mild atherosclerotic calcifications of the aortic arch. Mild coronary atherosclerosis of the LAD. Mediastinum/Nodes: No suspicious mediastinal lymphadenopathy. 8 mm short axis AP window node. Subcentimeter right thyroid nodule, benign. Lungs/Pleura: Radiation changes in the medial left lower lobe. Stable 7 mm right lower lobe nodule (series 5/image 59). Moderate centrilobular and paraseptal emphysematous changes. Biapical pleural-parenchymal scarring. Small left pleural effusion, partially loculated.  No pneumothorax. Upper Abdomen: Visualized upper abdomen is unremarkable, noting vascular calcifications. Musculoskeletal: Prior vertebral augmentation at T10-12. IMPRESSION: Radiation changes in the medial left lower lobe. No findings specific for recurrent or metastatic disease. 7 mm right lower lobe nodule, unchanged, likely benign. Continued attention on follow-up is suggested. Aortic Atherosclerosis (ICD10-I70.0) and Emphysema (ICD10-J43.9). Electronically Signed   By: Julian Hy M.D.   On: 06/12/2018 10:43     ASSESSMENT/PLAN:  This is a very pleasant 73 year old Caucasian female with stage IIIa non-small cell lung cancer, poorly differentiated squamous cell carcinoma.  She presented with a large superior segment of the left lower lobe mass with questionable left hilar adenopathy.  She was diagnosed in August 2019.  The patient underwent a course of concurrent chemoradiation with carboplatin for an AUC of 2 and paclitaxel 45 mg/m.  Status post 7 cycles with a partial response.  The patient is currently undergoing consolidation immunotherapy with Imfinzi 10 mg/kg IV every 2 weeks.  She is status post 13 cycles of treatment.  She continues to tolerate treatment well without any concerning adverse effects.   Labs were reviewed with the patient. I recommend that she  proceed with cycle #14 today as scheduled.   I will see her back for a follow up visit in 2 weeks for evaluation prior to starting cycle #15.   The patient was advised to call immediately if she has any concerning symptoms in the interval. The patient voices understanding of current disease status and treatment options and is in agreement with the current care plan. All questions were answered. The patient knows to call the clinic with any problems, questions or concerns. We can certainly see the patient much sooner if necessary  No orders of the defined types were placed in this encounter.    Cassandra L Heilingoetter, PA-C 06/27/18

## 2018-07-05 ENCOUNTER — Telehealth: Payer: Self-pay | Admitting: Internal Medicine

## 2018-07-05 NOTE — Telephone Encounter (Signed)
[facility] afternoon PAL 7/16 moved appointments to morning. Confirmed with patient.

## 2018-07-11 ENCOUNTER — Other Ambulatory Visit: Payer: Self-pay

## 2018-07-11 ENCOUNTER — Telehealth: Payer: Self-pay | Admitting: Internal Medicine

## 2018-07-11 ENCOUNTER — Inpatient Hospital Stay: Payer: Medicare Other

## 2018-07-11 ENCOUNTER — Inpatient Hospital Stay: Payer: Medicare Other | Attending: Internal Medicine

## 2018-07-11 ENCOUNTER — Inpatient Hospital Stay (HOSPITAL_BASED_OUTPATIENT_CLINIC_OR_DEPARTMENT_OTHER): Payer: Medicare Other | Admitting: Internal Medicine

## 2018-07-11 ENCOUNTER — Encounter: Payer: Self-pay | Admitting: Internal Medicine

## 2018-07-11 VITALS — BP 115/68 | HR 72 | Temp 98.2°F | Resp 18 | Ht 61.0 in | Wt 77.1 lb

## 2018-07-11 DIAGNOSIS — Z880 Allergy status to penicillin: Secondary | ICD-10-CM

## 2018-07-11 DIAGNOSIS — J439 Emphysema, unspecified: Secondary | ICD-10-CM | POA: Insufficient documentation

## 2018-07-11 DIAGNOSIS — I700 Atherosclerosis of aorta: Secondary | ICD-10-CM

## 2018-07-11 DIAGNOSIS — J90 Pleural effusion, not elsewhere classified: Secondary | ICD-10-CM | POA: Diagnosis not present

## 2018-07-11 DIAGNOSIS — Z5112 Encounter for antineoplastic immunotherapy: Secondary | ICD-10-CM | POA: Diagnosis not present

## 2018-07-11 DIAGNOSIS — R200 Anesthesia of skin: Secondary | ICD-10-CM | POA: Diagnosis not present

## 2018-07-11 DIAGNOSIS — E041 Nontoxic single thyroid nodule: Secondary | ICD-10-CM | POA: Insufficient documentation

## 2018-07-11 DIAGNOSIS — M810 Age-related osteoporosis without current pathological fracture: Secondary | ICD-10-CM

## 2018-07-11 DIAGNOSIS — Z79899 Other long term (current) drug therapy: Secondary | ICD-10-CM

## 2018-07-11 DIAGNOSIS — M549 Dorsalgia, unspecified: Secondary | ICD-10-CM | POA: Diagnosis not present

## 2018-07-11 DIAGNOSIS — C3432 Malignant neoplasm of lower lobe, left bronchus or lung: Secondary | ICD-10-CM

## 2018-07-11 DIAGNOSIS — M1990 Unspecified osteoarthritis, unspecified site: Secondary | ICD-10-CM | POA: Insufficient documentation

## 2018-07-11 DIAGNOSIS — C3492 Malignant neoplasm of unspecified part of left bronchus or lung: Secondary | ICD-10-CM

## 2018-07-11 DIAGNOSIS — Z9221 Personal history of antineoplastic chemotherapy: Secondary | ICD-10-CM

## 2018-07-11 LAB — CBC WITH DIFFERENTIAL (CANCER CENTER ONLY)
Abs Immature Granulocytes: 0.01 10*3/uL (ref 0.00–0.07)
Basophils Absolute: 0.1 10*3/uL (ref 0.0–0.1)
Basophils Relative: 1 %
Eosinophils Absolute: 0.2 10*3/uL (ref 0.0–0.5)
Eosinophils Relative: 4 %
HCT: 36.5 % (ref 36.0–46.0)
Hemoglobin: 11.3 g/dL — ABNORMAL LOW (ref 12.0–15.0)
Immature Granulocytes: 0 %
Lymphocytes Relative: 21 %
Lymphs Abs: 1 10*3/uL (ref 0.7–4.0)
MCH: 28.4 pg (ref 26.0–34.0)
MCHC: 31 g/dL (ref 30.0–36.0)
MCV: 91.7 fL (ref 80.0–100.0)
Monocytes Absolute: 0.6 10*3/uL (ref 0.1–1.0)
Monocytes Relative: 12 %
Neutro Abs: 3 10*3/uL (ref 1.7–7.7)
Neutrophils Relative %: 62 %
Platelet Count: 308 10*3/uL (ref 150–400)
RBC: 3.98 MIL/uL (ref 3.87–5.11)
RDW: 14.3 % (ref 11.5–15.5)
WBC Count: 4.8 10*3/uL (ref 4.0–10.5)
nRBC: 0 % (ref 0.0–0.2)

## 2018-07-11 LAB — CMP (CANCER CENTER ONLY)
ALT: 9 U/L (ref 0–44)
AST: 17 U/L (ref 15–41)
Albumin: 3.5 g/dL (ref 3.5–5.0)
Alkaline Phosphatase: 116 U/L (ref 38–126)
Anion gap: 9 (ref 5–15)
BUN: 10 mg/dL (ref 8–23)
CO2: 25 mmol/L (ref 22–32)
Calcium: 8.7 mg/dL — ABNORMAL LOW (ref 8.9–10.3)
Chloride: 107 mmol/L (ref 98–111)
Creatinine: 0.75 mg/dL (ref 0.44–1.00)
GFR, Est AFR Am: >60 mL/min (ref >60)
GFR, Estimated: >60 mL/min (ref >60)
Glucose, Bld: 84 mg/dL (ref 70–99)
Potassium: 3.9 mmol/L (ref 3.5–5.1)
Sodium: 141 mmol/L (ref 135–145)
Total Bilirubin: 0.3 mg/dL (ref 0.3–1.2)
Total Protein: 7 g/dL (ref 6.5–8.1)

## 2018-07-11 LAB — TSH: TSH: 3.342 u[IU]/mL (ref 0.308–3.960)

## 2018-07-11 MED ORDER — SODIUM CHLORIDE 0.9 % IV SOLN
Freq: Once | INTRAVENOUS | Status: AC
  Administered 2018-07-11: 11:00:00 via INTRAVENOUS
  Filled 2018-07-11: qty 250

## 2018-07-11 MED ORDER — SODIUM CHLORIDE 0.9 % IV SOLN
10.6000 mg/kg | Freq: Once | INTRAVENOUS | Status: AC
  Administered 2018-07-11: 360 mg via INTRAVENOUS
  Filled 2018-07-11: qty 7.2

## 2018-07-11 NOTE — Telephone Encounter (Signed)
Scheduled appt per 7/2 los - pt to get na updated schedule next visit.

## 2018-07-11 NOTE — Patient Instructions (Signed)
[facility] Discharge Instructions for Patients Receiving Chemotherapy  Today you received the following chemotherapy agents: Imfinzi.  To help prevent nausea and vomiting after your treatment, we encourage you to take your nausea medication as directed.   If you develop nausea and vomiting that is not controlled by your nausea medication, call the clinic.   BELOW ARE SYMPTOMS THAT SHOULD BE REPORTED IMMEDIATELY:  *FEVER GREATER THAN 100.5 F  *CHILLS WITH OR WITHOUT FEVER  NAUSEA AND VOMITING THAT IS NOT CONTROLLED WITH YOUR NAUSEA MEDICATION  *UNUSUAL SHORTNESS OF BREATH  *UNUSUAL BRUISING OR BLEEDING  TENDERNESS IN MOUTH AND THROAT WITH OR WITHOUT PRESENCE OF ULCERS  *URINARY PROBLEMS  *BOWEL PROBLEMS  UNUSUAL RASH Items with * indicate a potential emergency and should be followed up as soon as possible.  Feel free to call the clinic should you have any questions or concerns. The clinic phone number is [phone].  Please show the CHEMO ALERT CARD at check-in to the Emergency Department and triage nurse.   

## 2018-07-11 NOTE — Progress Notes (Signed)
[place] [phone]   [phone]  OFFICE PROGRESS NOTE  [name], NP [place] [address]  DIAGNOSIS: Stage IIb/IIIa (T3, N0/N1, M0) non-small cell lung cancer poorly differentiated squamous cell carcinoma diagnosed in August 2019.  The patient presented with large superior segment left lower lobe mass with questionable left hilar adenopathy.  PRIOR THERAPY: Concurrent chemoradiation with weekly carboplatin for AUC of 2 and paclitaxel 45 mg/M2.  Status post 7 cycles.  Last dose was given October 29, 2017 with partial response.  CURRENT THERAPY: Consolidation treatment with immunotherapy with Imfinzi 10 mg/KG every 2 weeks to start December 13, 2017.  Status post 14 cycles.  INTERVAL HISTORY: Sherry [name] 73 y.o. female returns to the clinic today for follow-up visit.  The patient is feeling fine today with no concerning complaints except for pain on the left side of the back secondary to osteoporosis and compression fracture.  She also has some mild numbness in the feet.  Her eyelashes are falling but she has an appointment with her ophthalmologist next months.  She denied having any chest pain, shortness of breath, cough or hemoptysis.  She denied having any fever or chills.  She has no nausea, vomiting, diarrhea or constipation.  She continues to tolerate her treatment with immunotherapy fairly well.  MEDICAL HISTORY: Past Medical History:  Diagnosis Date  . Allergy   . Anxiety   . Arthritis   . Cavitating mass in left lower lung lobe   . Complication of anesthesia    woke up during procedure  . COPD (chronic obstructive pulmonary disease) ([place])   . Depression   . Family history of adverse reaction to anesthesia    " my mother was always hard to wake up"  . Full dentures   . Headache   . Neuromuscular disorder ([place])   . Pneumonia   . Thoracic spine fracture ([place]) 11/01/2017   T9  . Wears glasses      ALLERGIES:  is allergic to omnipaque [iohexol] and penicillins.  MEDICATIONS:  Current Outpatient Medications  Medication Sig Dispense Refill  . acetaminophen (TYLENOL 8 HOUR) 650 MG CR tablet Take 1 tablet (650 mg total) by mouth every 8 (eight) hours as needed for pain or fever. 30 tablet 0  . alendronate (FOSAMAX) 70 MG tablet Take 70 mg by mouth once a week.    . cyanocobalamin (,VITAMIN B-12,) 1000 MCG/ML injection Inject 1,000 mcg into the muscle every 30 (thirty) days. Administered by Dr. Eldridge Abrahams  - every other month (per pt)    . gabapentin (NEURONTIN) 100 MG capsule Take 1 capsule (100 mg total) by mouth 3 (three) times daily. Take 1 tablet once a day 90 capsule 2  . guaifenesin (ROBITUSSIN) 100 MG/5ML syrup Take 200 mg by mouth 3 (three) times daily as needed for cough.     . Menthol-Ascorbic Acid (LUDENS COUGH DROPS MT) Use as directed 1 lozenge in the mouth or throat 3 (three) times daily as needed (cough).     . Multiple Vitamin (MULTIVITAMIN WITH MINERALS) TABS tablet Take 1 tablet by mouth daily.    Marland Kitchen oxyCODONE-acetaminophen (PERCOCET/ROXICET) 5-325 MG tablet Take 1 tablet by mouth every 8 (eight) hours as needed for severe pain. 20 tablet 0  . prochlorperazine (COMPAZINE) 10 MG tablet Take 1 tablet (10 mg total) by mouth every 6 (six) hours as needed for nausea or vomiting. 30 tablet 0  No current facility-administered medications for this visit.     SURGICAL HISTORY:  Past Surgical History:  Procedure Laterality Date  . ELBOW SURGERY     for  "Tennis Elbow"  . hand surgery    . IR KYPHO THORACIC WITH BONE BIOPSY  11/27/2017  . IR KYPHO THORACIC WITH BONE BIOPSY  03/01/2018  . IR KYPHO THORACIC WITH BONE BIOPSY  03/26/2018  . IR RADIOLOGIST EVAL & MGMT  11/20/2017  . IR RADIOLOGIST EVAL & MGMT  02/26/2018  . IR RADIOLOGIST EVAL & MGMT  03/13/2018  . MULTIPLE TOOTH EXTRACTIONS    . TUBAL LIGATION    . VIDEO BRONCHOSCOPY N/A 08/31/2017   Procedure: VIDEO BRONCHOSCOPY;   Surgeon: Melrose Nakayama, MD;  Location: [facility] OR;  Service: Thoracic;  Laterality: N/A;    REVIEW OF SYSTEMS:  A comprehensive review of systems was negative except for: Musculoskeletal: positive for back pain   PHYSICAL EXAMINATION: General appearance: alert, cooperative and no distress Head: Normocephalic, without obvious abnormality, atraumatic Neck: no adenopathy, no JVD, supple, symmetrical, trachea midline and thyroid not enlarged, symmetric, no tenderness/mass/nodules Lymph nodes: Cervical, supraclavicular, and axillary nodes normal. Resp: clear to auscultation bilaterally Back: symmetric, no curvature. ROM normal. No CVA tenderness. Cardio: regular rate and rhythm, S1, S2 normal, no murmur, click, rub or gallop GI: soft, non-tender; bowel sounds normal; no masses,  no organomegaly Extremities: extremities normal, atraumatic, no cyanosis or edema  ECOG PERFORMANCE STATUS: 1 - Symptomatic but completely ambulatory  Blood pressure 115/68, pulse 72, temperature 98.2 F (36.8 C), temperature source Oral, resp. rate 18, height 5\' 1"  (1.549 m), weight 77 lb 1.6 oz (35 kg), SpO2 96 %.  LABORATORY DATA: Lab Results  Component Value Date   WBC 4.8 07/11/2018   HGB 11.3 (L) 07/11/2018   HCT 36.5 07/11/2018   MCV 91.7 07/11/2018   PLT 308 07/11/2018      Chemistry      Component Value Date/Time   NA 142 06/27/2018 1209   K 3.6 06/27/2018 1209   CL 105 06/27/2018 1209   CO2 25 06/27/2018 1209   BUN 11 06/27/2018 1209   CREATININE 0.80 06/27/2018 1209      Component Value Date/Time   CALCIUM 9.0 06/27/2018 1209   ALKPHOS 129 (H) 06/27/2018 1209   AST 18 06/27/2018 1209   ALT 11 06/27/2018 1209   BILITOT 0.4 06/27/2018 1209       RADIOGRAPHIC STUDIES: Ct Chest W Contrast  Result Date: 06/12/2018 CLINICAL DATA:  Left lung cancer, diagnosed July 2019, status post chemotherapy and XRT, immunotherapy ongoing EXAM: CT CHEST WITH CONTRAST TECHNIQUE: Multidetector CT  imaging of the chest was performed during intravenous contrast administration. CONTRAST:  43mL OMNIPAQUE IOHEXOL 300 MG/ML  SOLN COMPARISON:  03/19/2018 FINDINGS: Cardiovascular: Heart is normal in size.  No pericardial effusion. No evidence of thoracic aortic aneurysm. Mild atherosclerotic calcifications of the aortic arch. Mild coronary atherosclerosis of the LAD. Mediastinum/Nodes: No suspicious mediastinal lymphadenopathy. 8 mm short axis AP window node. Subcentimeter right thyroid nodule, benign. Lungs/Pleura: Radiation changes in the medial left lower lobe. Stable 7 mm right lower lobe nodule (series 5/image 59). Moderate centrilobular and paraseptal emphysematous changes. Biapical pleural-parenchymal scarring. Small left pleural effusion, partially loculated.  No pneumothorax. Upper Abdomen: Visualized upper abdomen is unremarkable, noting vascular calcifications. Musculoskeletal: Prior vertebral augmentation at T10-12. IMPRESSION: Radiation changes in the medial left lower lobe. No findings specific for recurrent or metastatic disease. 7 mm right lower lobe nodule, unchanged, likely  benign. Continued attention on follow-up is suggested. Aortic Atherosclerosis (ICD10-I70.0) and Emphysema (ICD10-J43.9). Electronically Signed   By: Julian Hy M.D.   On: 06/12/2018 10:43    ASSESSMENT AND PLAN: This is a very pleasant 73 years old white female with a stage IIIa non-small cell lung cancer, poorly differentiated squamous cell carcinoma.   The patient underwent a course of concurrent chemoradiation with weekly carboplatin and paclitaxel status post 7 cycles with partial response.  The patient is currently undergoing consolidation treatment with immunotherapy with Imfinzi status post 14 cycles.  The patient continues to tolerate her treatment fairly well with no concerning adverse effects. I recommended for her to proceed with cycle #15 today as planned. I will see her back for follow-up visit in 2  weeks for evaluation before the next cycle of her treatment. She was advised to call immediately if she has any concerning symptoms in the interval. The patient voices understanding of current disease status and treatment options and is in agreement with the current care plan. All questions were answered. The patient knows to call the clinic with any problems, questions or concerns. We can certainly see the patient much sooner if necessary.  Disclaimer: This note was dictated with voice recognition software. Similar sounding words can inadvertently be transcribed and may not be corrected upon review.

## 2018-07-25 ENCOUNTER — Ambulatory Visit: Payer: Medicare Other | Admitting: Physician Assistant

## 2018-07-25 ENCOUNTER — Inpatient Hospital Stay (HOSPITAL_BASED_OUTPATIENT_CLINIC_OR_DEPARTMENT_OTHER): Payer: Medicare Other | Admitting: Physician Assistant

## 2018-07-25 ENCOUNTER — Ambulatory Visit: Payer: Medicare Other | Admitting: Internal Medicine

## 2018-07-25 ENCOUNTER — Ambulatory Visit: Payer: Medicare Other

## 2018-07-25 ENCOUNTER — Inpatient Hospital Stay: Payer: Medicare Other

## 2018-07-25 ENCOUNTER — Other Ambulatory Visit: Payer: Medicare Other

## 2018-07-25 ENCOUNTER — Other Ambulatory Visit: Payer: Self-pay

## 2018-07-25 VITALS — BP 106/67 | HR 71 | Temp 98.7°F | Resp 18 | Ht 61.0 in | Wt 77.5 lb

## 2018-07-25 DIAGNOSIS — M810 Age-related osteoporosis without current pathological fracture: Secondary | ICD-10-CM

## 2018-07-25 DIAGNOSIS — Z79899 Other long term (current) drug therapy: Secondary | ICD-10-CM

## 2018-07-25 DIAGNOSIS — I700 Atherosclerosis of aorta: Secondary | ICD-10-CM

## 2018-07-25 DIAGNOSIS — J439 Emphysema, unspecified: Secondary | ICD-10-CM

## 2018-07-25 DIAGNOSIS — Z880 Allergy status to penicillin: Secondary | ICD-10-CM

## 2018-07-25 DIAGNOSIS — M549 Dorsalgia, unspecified: Secondary | ICD-10-CM

## 2018-07-25 DIAGNOSIS — R200 Anesthesia of skin: Secondary | ICD-10-CM

## 2018-07-25 DIAGNOSIS — Z5112 Encounter for antineoplastic immunotherapy: Secondary | ICD-10-CM

## 2018-07-25 DIAGNOSIS — C3432 Malignant neoplasm of lower lobe, left bronchus or lung: Secondary | ICD-10-CM

## 2018-07-25 DIAGNOSIS — C3492 Malignant neoplasm of unspecified part of left bronchus or lung: Secondary | ICD-10-CM

## 2018-07-25 DIAGNOSIS — E041 Nontoxic single thyroid nodule: Secondary | ICD-10-CM

## 2018-07-25 DIAGNOSIS — Z9221 Personal history of antineoplastic chemotherapy: Secondary | ICD-10-CM

## 2018-07-25 DIAGNOSIS — M1990 Unspecified osteoarthritis, unspecified site: Secondary | ICD-10-CM

## 2018-07-25 DIAGNOSIS — J90 Pleural effusion, not elsewhere classified: Secondary | ICD-10-CM

## 2018-07-25 LAB — CMP (CANCER CENTER ONLY)
ALT: 8 U/L (ref 0–44)
AST: 17 U/L (ref 15–41)
Albumin: 3.5 g/dL (ref 3.5–5.0)
Alkaline Phosphatase: 118 U/L (ref 38–126)
Anion gap: 10 (ref 5–15)
BUN: 11 mg/dL (ref 8–23)
CO2: 25 mmol/L (ref 22–32)
Calcium: 8.6 mg/dL — ABNORMAL LOW (ref 8.9–10.3)
Chloride: 106 mmol/L (ref 98–111)
Creatinine: 0.75 mg/dL (ref 0.44–1.00)
GFR, Est AFR Am: >60 mL/min (ref >60)
GFR, Estimated: >60 mL/min (ref >60)
Glucose, Bld: 88 mg/dL (ref 70–99)
Potassium: 3.7 mmol/L (ref 3.5–5.1)
Sodium: 141 mmol/L (ref 135–145)
Total Bilirubin: 0.5 mg/dL (ref 0.3–1.2)
Total Protein: 6.9 g/dL (ref 6.5–8.1)

## 2018-07-25 LAB — CBC WITH DIFFERENTIAL (CANCER CENTER ONLY)
Abs Immature Granulocytes: 0.01 10*3/uL (ref 0.00–0.07)
Basophils Absolute: 0 10*3/uL (ref 0.0–0.1)
Basophils Relative: 1 %
Eosinophils Absolute: 0.2 10*3/uL (ref 0.0–0.5)
Eosinophils Relative: 5 %
HCT: 37.1 % (ref 36.0–46.0)
Hemoglobin: 11.6 g/dL — ABNORMAL LOW (ref 12.0–15.0)
Immature Granulocytes: 0 %
Lymphocytes Relative: 24 %
Lymphs Abs: 1.1 10*3/uL (ref 0.7–4.0)
MCH: 28.6 pg (ref 26.0–34.0)
MCHC: 31.3 g/dL (ref 30.0–36.0)
MCV: 91.4 fL (ref 80.0–100.0)
Monocytes Absolute: 0.5 10*3/uL (ref 0.1–1.0)
Monocytes Relative: 12 %
Neutro Abs: 2.5 10*3/uL (ref 1.7–7.7)
Neutrophils Relative %: 58 %
Platelet Count: 293 10*3/uL (ref 150–400)
RBC: 4.06 MIL/uL (ref 3.87–5.11)
RDW: 14.3 % (ref 11.5–15.5)
WBC Count: 4.4 10*3/uL (ref 4.0–10.5)
nRBC: 0 % (ref 0.0–0.2)

## 2018-07-25 MED ORDER — SODIUM CHLORIDE 0.9 % IV SOLN
10.6000 mg/kg | Freq: Once | INTRAVENOUS | Status: AC
  Administered 2018-07-25: 12:00:00 360 mg via INTRAVENOUS
  Filled 2018-07-25: qty 7.2

## 2018-07-25 MED ORDER — SODIUM CHLORIDE 0.9 % IV SOLN
Freq: Once | INTRAVENOUS | Status: AC
  Administered 2018-07-25: 11:00:00 via INTRAVENOUS
  Filled 2018-07-25: qty 250

## 2018-07-25 NOTE — Patient Instructions (Signed)
[facility] Discharge Instructions for Patients Receiving Chemotherapy  Today you received the following chemotherapy agents: Imfinzi.  To help prevent nausea and vomiting after your treatment, we encourage you to take your nausea medication as directed.   If you develop nausea and vomiting that is not controlled by your nausea medication, call the clinic.   BELOW ARE SYMPTOMS THAT SHOULD BE REPORTED IMMEDIATELY:  *FEVER GREATER THAN 100.5 F  *CHILLS WITH OR WITHOUT FEVER  NAUSEA AND VOMITING THAT IS NOT CONTROLLED WITH YOUR NAUSEA MEDICATION  *UNUSUAL SHORTNESS OF BREATH  *UNUSUAL BRUISING OR BLEEDING  TENDERNESS IN MOUTH AND THROAT WITH OR WITHOUT PRESENCE OF ULCERS  *URINARY PROBLEMS  *BOWEL PROBLEMS  UNUSUAL RASH Items with * indicate a potential emergency and should be followed up as soon as possible.  Feel free to call the clinic should you have any questions or concerns. The clinic phone number is [phone].  Please show the CHEMO ALERT CARD at check-in to the Emergency Department and triage nurse.   

## 2018-07-25 NOTE — Progress Notes (Signed)
[place] OFFICE PROGRESS NOTE  [name], NP [place] [address]  DIAGNOSIS: Stage IIb/IIIa (T3, N0/N1, M0)non-small cell lung cancer poorly differentiated squamous cell carcinoma diagnosed in August 2019. The patient presented with large superior segment leftlower lobe mass with questionable left hilar adenopathy.  PRIOR THERAPY: Concurrent chemoradiation with weekly carboplatin for AUC of 2 and paclitaxel 45 mg/M2. Status post 7 cycles. Last dose was given October 29, 2017 with partial response.  CURRENT THERAPY: Consolidation treatment with immunotherapy with Imfinzi 10 mg/KG every 2 weeks to start December 13, 2017. Status post15cycles.  INTERVAL HISTORY: Sherry [name] 73 y.o. female returns to the clinic for a follow-up visit.  The patient is feeling well today without any concerning complaints.  She is tolerating her treatment well without any adverse side effects.  She denies any fever, chills, night sweats, or weight loss.  She denies any chest pain, shortness of breath, cough, or hemoptysis.  She denies any nausea, vomiting, diarrhea, or constipation.  She denies any headache or visual changes.  Her back pain is significantly improved from prior. She states that she only needs to take pain medication once every 3 days or so.  She is here today for evaluation before starting cycle #16.  MEDICAL HISTORY: Past Medical History:  Diagnosis Date  . Allergy   . Anxiety   . Arthritis   . Cavitating mass in left lower lung lobe   . Complication of anesthesia    woke up during procedure  . COPD (chronic obstructive pulmonary disease) ([place])   . Depression   . Family history of adverse reaction to anesthesia    " my mother was always hard to wake up"  . Full dentures   . Headache   . Neuromuscular disorder ([place])   . Pneumonia   . Thoracic spine fracture ([place]) 11/01/2017   T9  . Wears glasses     ALLERGIES:  is allergic to  omnipaque [iohexol] and penicillins.  MEDICATIONS:  Current Outpatient Medications  Medication Sig Dispense Refill  . acetaminophen (TYLENOL 8 HOUR) 650 MG CR tablet Take 1 tablet (650 mg total) by mouth every 8 (eight) hours as needed for pain or fever. 30 tablet 0  . alendronate (FOSAMAX) 70 MG tablet Take 70 mg by mouth once a week.    . cyanocobalamin (,VITAMIN B-12,) 1000 MCG/ML injection Inject 1,000 mcg into the muscle every 30 (thirty) days. Administered by Dr. Eldridge Abrahams  - every other month (per pt)    . gabapentin (NEURONTIN) 100 MG capsule Take 1 capsule (100 mg total) by mouth 3 (three) times daily. Take 1 tablet once a day 90 capsule 2  . guaifenesin (ROBITUSSIN) 100 MG/5ML syrup Take 200 mg by mouth 3 (three) times daily as needed for cough.     . Menthol-Ascorbic Acid (LUDENS COUGH DROPS MT) Use as directed 1 lozenge in the mouth or throat 3 (three) times daily as needed (cough).     . Multiple Vitamin (MULTIVITAMIN WITH MINERALS) TABS tablet Take 1 tablet by mouth daily.    Marland Kitchen oxyCODONE-acetaminophen (PERCOCET/ROXICET) 5-325 MG tablet Take 1 tablet by mouth every 8 (eight) hours as needed for severe pain. 20 tablet 0  . prochlorperazine (COMPAZINE) 10 MG tablet Take 1 tablet (10 mg total) by mouth every 6 (six) hours as needed for nausea or vomiting. 30 tablet 0   No current facility-administered medications for this visit.     SURGICAL  HISTORY:  Past Surgical History:  Procedure Laterality Date  . ELBOW SURGERY     for  "Tennis Elbow"  . hand surgery    . IR KYPHO THORACIC WITH BONE BIOPSY  11/27/2017  . IR KYPHO THORACIC WITH BONE BIOPSY  03/01/2018  . IR KYPHO THORACIC WITH BONE BIOPSY  03/26/2018  . IR RADIOLOGIST EVAL & MGMT  11/20/2017  . IR RADIOLOGIST EVAL & MGMT  02/26/2018  . IR RADIOLOGIST EVAL & MGMT  03/13/2018  . MULTIPLE TOOTH EXTRACTIONS    . TUBAL LIGATION    . VIDEO BRONCHOSCOPY N/A 08/31/2017   Procedure: VIDEO BRONCHOSCOPY;  Surgeon: Melrose Nakayama, MD;  Location: [facility] OR;  Service: Thoracic;  Laterality: N/A;    REVIEW OF SYSTEMS:   Review of Systems  Constitutional: Negative for appetite change, chills, fatigue, fever and unexpected weight change.  HENT:   Negative for mouth sores, nosebleeds, sore throat and trouble swallowing.   Eyes: Negative for eye problems and icterus.  Respiratory: Negative for cough, hemoptysis, shortness of breath and wheezing.   Cardiovascular: Negative for chest pain and leg swelling.  Gastrointestinal: Negative for abdominal pain, constipation, diarrhea, nausea and vomiting.  Genitourinary: Negative for bladder incontinence, difficulty urinating, dysuria, frequency and hematuria.   Musculoskeletal: Positive for back pain (improved). Negative for gait problem, neck pain and neck stiffness.  Skin: Negative for itching and rash.  Neurological: Negative for dizziness, extremity weakness, gait problem, headaches, light-headedness and seizures.  Hematological: Negative for adenopathy. Does not bruise/bleed easily.  Psychiatric/Behavioral: Negative for confusion, depression and sleep disturbance. The patient is not nervous/anxious.     PHYSICAL EXAMINATION:  Blood pressure 106/67, pulse 71, temperature 98.7 F (37.1 C), temperature source Oral, resp. rate 18, height 5\' 1"  (1.549 m), weight 77 lb 8 oz (35.2 kg), SpO2 99 %.  ECOG PERFORMANCE STATUS: 1 - Symptomatic but completely ambulatory  Physical Exam  Constitutional: Oriented to person, place, and time and thin-appearing female and in no distress.  HENT:  Head: Normocephalic and atraumatic.  Mouth/Throat: Oropharynx is clear and moist. No oropharyngeal exudate.  Eyes: Conjunctivae are normal. Right eye exhibits no discharge. Left eye exhibits no discharge. No scleral icterus.  Neck: Normal range of motion. Neck supple.  Cardiovascular: Normal rate, regular rhythm, normal heart sounds and intact distal pulses.   Pulmonary/Chest: Wheezing present in all  lung fields. Effort normal and breath sounds normal. No respiratory distress. No rales.  Abdominal: Soft. Bowel sounds are normal. Exhibits no distension and no mass. There is no tenderness.  Musculoskeletal: Normal range of motion. Exhibits no edema.  Lymphadenopathy:    No cervical adenopathy.  Neurological: Alert and oriented to person, place, and time. Exhibits normal muscle tone. Gait normal. Coordination normal.  Skin: Skin is warm and dry. No rash noted. Not diaphoretic. No erythema. No pallor.  Psychiatric: Mood, memory and judgment normal.  Vitals reviewed.  LABORATORY DATA: Lab Results  Component Value Date   WBC 4.4 07/25/2018   HGB 11.6 (L) 07/25/2018   HCT 37.1 07/25/2018   MCV 91.4 07/25/2018   PLT 293 07/25/2018      Chemistry      Component Value Date/Time   NA 141 07/25/2018 1011   K 3.7 07/25/2018 1011   CL 106 07/25/2018 1011   CO2 25 07/25/2018 1011   BUN 11 07/25/2018 1011   CREATININE 0.75 07/25/2018 1011      Component Value Date/Time   CALCIUM 8.6 (L) 07/25/2018 1011   ALKPHOS  118 07/25/2018 1011   AST 17 07/25/2018 1011   ALT 8 07/25/2018 1011   BILITOT 0.5 07/25/2018 1011       RADIOGRAPHIC STUDIES:  No results found.   ASSESSMENT/PLAN:  This is a very pleasant 73 year old Caucasian female with stage IIIa non-small cell lung cancer, poorly differentiated squamous cell carcinoma. She presented with a large superior segment of theleft lower lobe mass with questionable left hilar adenopathy. She was diagnosed in August 2019. The patient underwent a course of concurrent chemoradiation with carboplatin for an AUC of 2 and paclitaxel45 mg/m. Status post 7 cycles with a partial response.  The patient is currently undergoing consolidation immunotherapy with Imfinzi 10 mg/kg IV every 2 weeks. She is status post 15cycles of treatment. She continues to tolerate treatment well without any concerning adverse effects.  Labs were reviewed with  the patient. I recommend that she proceed with cycle #16 today as scheduled.   I will see her back for a follow up visit in 2 weeks for evaluation prior to starting cycle #17.   The patient was advised to call immediately if she has any concerning symptoms in the interval. The patient voices understanding of current disease status and treatment options and is in agreement with the current care plan. All questions were answered. The patient knows to call the clinic with any problems, questions or concerns. We can certainly see the patient much sooner if necessary  No orders of the defined types were placed in this encounter.     L , PA-C 07/25/18

## 2018-08-08 ENCOUNTER — Inpatient Hospital Stay (HOSPITAL_BASED_OUTPATIENT_CLINIC_OR_DEPARTMENT_OTHER): Payer: Medicare Other | Admitting: Physician Assistant

## 2018-08-08 ENCOUNTER — Inpatient Hospital Stay: Payer: Medicare Other

## 2018-08-08 ENCOUNTER — Other Ambulatory Visit: Payer: Self-pay

## 2018-08-08 VITALS — BP 121/58 | HR 71 | Temp 98.7°F | Resp 18 | Ht 61.0 in | Wt 79.9 lb

## 2018-08-08 DIAGNOSIS — Z9221 Personal history of antineoplastic chemotherapy: Secondary | ICD-10-CM

## 2018-08-08 DIAGNOSIS — E041 Nontoxic single thyroid nodule: Secondary | ICD-10-CM

## 2018-08-08 DIAGNOSIS — I700 Atherosclerosis of aorta: Secondary | ICD-10-CM

## 2018-08-08 DIAGNOSIS — R200 Anesthesia of skin: Secondary | ICD-10-CM | POA: Diagnosis not present

## 2018-08-08 DIAGNOSIS — M549 Dorsalgia, unspecified: Secondary | ICD-10-CM

## 2018-08-08 DIAGNOSIS — J90 Pleural effusion, not elsewhere classified: Secondary | ICD-10-CM

## 2018-08-08 DIAGNOSIS — C3492 Malignant neoplasm of unspecified part of left bronchus or lung: Secondary | ICD-10-CM

## 2018-08-08 DIAGNOSIS — M810 Age-related osteoporosis without current pathological fracture: Secondary | ICD-10-CM

## 2018-08-08 DIAGNOSIS — C3432 Malignant neoplasm of lower lobe, left bronchus or lung: Secondary | ICD-10-CM | POA: Diagnosis not present

## 2018-08-08 DIAGNOSIS — J439 Emphysema, unspecified: Secondary | ICD-10-CM

## 2018-08-08 DIAGNOSIS — Z5112 Encounter for antineoplastic immunotherapy: Secondary | ICD-10-CM | POA: Diagnosis not present

## 2018-08-08 DIAGNOSIS — Z880 Allergy status to penicillin: Secondary | ICD-10-CM

## 2018-08-08 DIAGNOSIS — M1990 Unspecified osteoarthritis, unspecified site: Secondary | ICD-10-CM

## 2018-08-08 DIAGNOSIS — Z79899 Other long term (current) drug therapy: Secondary | ICD-10-CM

## 2018-08-08 LAB — CMP (CANCER CENTER ONLY)
ALT: 9 U/L (ref 0–44)
AST: 17 U/L (ref 15–41)
Albumin: 3.5 g/dL (ref 3.5–5.0)
Alkaline Phosphatase: 116 U/L (ref 38–126)
Anion gap: 8 (ref 5–15)
BUN: 15 mg/dL (ref 8–23)
CO2: 26 mmol/L (ref 22–32)
Calcium: 9 mg/dL (ref 8.9–10.3)
Chloride: 107 mmol/L (ref 98–111)
Creatinine: 0.81 mg/dL (ref 0.44–1.00)
GFR, Est AFR Am: >60 mL/min (ref >60)
GFR, Estimated: >60 mL/min (ref >60)
Glucose, Bld: 88 mg/dL (ref 70–99)
Potassium: 4 mmol/L (ref 3.5–5.1)
Sodium: 141 mmol/L (ref 135–145)
Total Bilirubin: 0.4 mg/dL (ref 0.3–1.2)
Total Protein: 6.8 g/dL (ref 6.5–8.1)

## 2018-08-08 LAB — CBC WITH DIFFERENTIAL (CANCER CENTER ONLY)
Abs Immature Granulocytes: 0.01 10*3/uL (ref 0.00–0.07)
Basophils Absolute: 0 10*3/uL (ref 0.0–0.1)
Basophils Relative: 1 %
Eosinophils Absolute: 0.2 10*3/uL (ref 0.0–0.5)
Eosinophils Relative: 5 %
HCT: 36.3 % (ref 36.0–46.0)
Hemoglobin: 11.3 g/dL — ABNORMAL LOW (ref 12.0–15.0)
Immature Granulocytes: 0 %
Lymphocytes Relative: 31 %
Lymphs Abs: 1.2 10*3/uL (ref 0.7–4.0)
MCH: 28.5 pg (ref 26.0–34.0)
MCHC: 31.1 g/dL (ref 30.0–36.0)
MCV: 91.4 fL (ref 80.0–100.0)
Monocytes Absolute: 0.5 10*3/uL (ref 0.1–1.0)
Monocytes Relative: 14 %
Neutro Abs: 1.8 10*3/uL (ref 1.7–7.7)
Neutrophils Relative %: 49 %
Platelet Count: 302 10*3/uL (ref 150–400)
RBC: 3.97 MIL/uL (ref 3.87–5.11)
RDW: 14.4 % (ref 11.5–15.5)
WBC Count: 3.7 10*3/uL — ABNORMAL LOW (ref 4.0–10.5)
nRBC: 0 % (ref 0.0–0.2)

## 2018-08-08 LAB — TSH: TSH: 4.565 u[IU]/mL — ABNORMAL HIGH (ref 0.308–3.960)

## 2018-08-08 MED ORDER — SODIUM CHLORIDE 0.9 % IV SOLN
Freq: Once | INTRAVENOUS | Status: AC
  Administered 2018-08-08: 12:00:00 via INTRAVENOUS
  Filled 2018-08-08: qty 250

## 2018-08-08 MED ORDER — SODIUM CHLORIDE 0.9 % IV SOLN
360.0000 mg | Freq: Once | INTRAVENOUS | Status: AC
  Administered 2018-08-08: 360 mg via INTRAVENOUS
  Filled 2018-08-08: qty 7.2

## 2018-08-08 NOTE — Patient Instructions (Signed)
[facility] Discharge Instructions for Patients Receiving Chemotherapy  Today you received the following chemotherapy agents: Imfinzi.  To help prevent nausea and vomiting after your treatment, we encourage you to take your nausea medication as directed.   If you develop nausea and vomiting that is not controlled by your nausea medication, call the clinic.   BELOW ARE SYMPTOMS THAT SHOULD BE REPORTED IMMEDIATELY:  *FEVER GREATER THAN 100.5 F  *CHILLS WITH OR WITHOUT FEVER  NAUSEA AND VOMITING THAT IS NOT CONTROLLED WITH YOUR NAUSEA MEDICATION  *UNUSUAL SHORTNESS OF BREATH  *UNUSUAL BRUISING OR BLEEDING  TENDERNESS IN MOUTH AND THROAT WITH OR WITHOUT PRESENCE OF ULCERS  *URINARY PROBLEMS  *BOWEL PROBLEMS  UNUSUAL RASH Items with * indicate a potential emergency and should be followed up as soon as possible.  Feel free to call the clinic should you have any questions or concerns. The clinic phone number is [phone].  Please show the CHEMO ALERT CARD at check-in to the Emergency Department and triage nurse.   

## 2018-08-08 NOTE — Progress Notes (Signed)
[facility] OFFICE PROGRESS NOTE  [name], NP [place] [address]  Stage IIb/IIIa (T3, N0/N1, M0)non-small cell lung cancer poorly differentiated squamous cell carcinoma diagnosed in August 2019. The patient presented with large superior segment leftlower lobe mass with questionable left hilar adenopathy.  PRIOR THERAPY:Concurrent chemoradiation with weekly carboplatin for AUC of 2 and paclitaxel 45 mg/M2. Status post 7 cycles. Last dose was given October 29, 2017 with partial response.  CURRENT THERAPY:Consolidation treatment with immunotherapy with Imfinzi 10 mg/KG every 2 weeks to start December 13, 2017. Status post16cycles  INTERVAL HISTORY: Sherry [name] 73 y.o. female returns to the clinic for a follow-up visit.  The patient is feeling well today without any concerning complaints.  She has been tolerating her treatment with immunotherapy well without any adverse side effects.  She denies any fever, chills, night sweats, or weight loss.  She actually gained a few pounds since her last appointment.  She denies any chest pain, shortness of breath, cough, or hemoptysis.  She denies any nausea, vomiting, diarrhea, or constipation.  She denies any headache or visual changes.  Her back pain from her history of a compression fracture is significantly improved from prior.  She has not needed to take a pain pill in 3 days.  She is here today for evaluation before starting cycle #17.  MEDICAL HISTORY: Past Medical History:  Diagnosis Date  . Allergy   . Anxiety   . Arthritis   . Cavitating mass in left lower lung lobe   . Complication of anesthesia    woke up during procedure  . COPD (chronic obstructive pulmonary disease) ([place])   . Depression   . Family history of adverse reaction to anesthesia    " my mother was always hard to wake up"  . Full dentures   . Headache   . Neuromuscular disorder ([place])   . Pneumonia   . Thoracic  spine fracture ([place]) 11/01/2017   T9  . Wears glasses     ALLERGIES:  is allergic to omnipaque [iohexol] and penicillins.  MEDICATIONS:  Current Outpatient Medications  Medication Sig Dispense Refill  . acetaminophen (TYLENOL 8 HOUR) 650 MG CR tablet Take 1 tablet (650 mg total) by mouth every 8 (eight) hours as needed for pain or fever. 30 tablet 0  . cyanocobalamin (,VITAMIN B-12,) 1000 MCG/ML injection Inject 1,000 mcg into the muscle every 30 (thirty) days. Administered by Dr. Eldridge Abrahams  - every other month (per pt)    . gabapentin (NEURONTIN) 100 MG capsule Take 1 capsule (100 mg total) by mouth 3 (three) times daily. Take 1 tablet once a day 90 capsule 2  . guaifenesin (ROBITUSSIN) 100 MG/5ML syrup Take 200 mg by mouth 3 (three) times daily as needed for cough.     . Menthol-Ascorbic Acid (LUDENS COUGH DROPS MT) Use as directed 1 lozenge in the mouth or throat 3 (three) times daily as needed (cough).     . Multiple Vitamin (MULTIVITAMIN WITH MINERALS) TABS tablet Take 1 tablet by mouth daily.    Marland Kitchen oxyCODONE-acetaminophen (PERCOCET/ROXICET) 5-325 MG tablet Take 1 tablet by mouth every 8 (eight) hours as needed for severe pain. 20 tablet 0  . prochlorperazine (COMPAZINE) 10 MG tablet Take 1 tablet (10 mg total) by mouth every 6 (six) hours as needed for nausea or vomiting. 30 tablet 0  . alendronate (FOSAMAX) 70 MG tablet Take 70 mg by mouth once a week.  No current facility-administered medications for this visit.     SURGICAL HISTORY:  Past Surgical History:  Procedure Laterality Date  . ELBOW SURGERY     for  "Tennis Elbow"  . hand surgery    . IR KYPHO THORACIC WITH BONE BIOPSY  11/27/2017  . IR KYPHO THORACIC WITH BONE BIOPSY  03/01/2018  . IR KYPHO THORACIC WITH BONE BIOPSY  03/26/2018  . IR RADIOLOGIST EVAL & MGMT  11/20/2017  . IR RADIOLOGIST EVAL & MGMT  02/26/2018  . IR RADIOLOGIST EVAL & MGMT  03/13/2018  . MULTIPLE TOOTH EXTRACTIONS    . TUBAL LIGATION    . VIDEO  BRONCHOSCOPY N/A 08/31/2017   Procedure: VIDEO BRONCHOSCOPY;  Surgeon: Melrose Nakayama, MD;  Location: [facility] OR;  Service: Thoracic;  Laterality: N/A;    REVIEW OF SYSTEMS:   Review of Systems  Constitutional: Negative for appetite change, chills, fatigue, fever and unexpected weight change.  HENT:   Negative for mouth sores, nosebleeds, sore throat and trouble swallowing.   Eyes: Negative for eye problems and icterus.  Respiratory: Negative for cough, hemoptysis, shortness of breath and wheezing.   Cardiovascular: Negative for chest pain and leg swelling.  Gastrointestinal: Negative for abdominal pain, constipation, diarrhea, nausea and vomiting.  Genitourinary: Negative for bladder incontinence, difficulty urinating, dysuria, frequency and hematuria.   Musculoskeletal: Negative for back pain, gait problem, neck pain and neck stiffness.  Skin: Negative for itching and rash.  Neurological: Negative for dizziness, extremity weakness, gait problem, headaches, light-headedness and seizures.  Hematological: Negative for adenopathy. Does not bruise/bleed easily.  Psychiatric/Behavioral: Negative for confusion, depression and sleep disturbance. The patient is not nervous/anxious.     PHYSICAL EXAMINATION:  Blood pressure (!) 121/58, pulse 71, temperature 98.7 F (37.1 C), resp. rate 18, height 5\' 1"  (1.549 m), weight 79 lb 14.4 oz (36.2 kg), SpO2 97 %.  ECOG PERFORMANCE STATUS: 1 - Symptomatic but completely ambulatory  Physical Exam  Constitutional: Oriented to person, place, and time and thin-appearing female and in no distress.  HENT:  Head: Normocephalic and atraumatic.  Mouth/Throat: Oropharynx is clear and moist. No oropharyngeal exudate.  Eyes: Conjunctivae are normal. Right eye exhibits no discharge. Left eye exhibits no discharge. No scleral icterus.  Neck: Normal range of motion. Neck supple.  Cardiovascular: Normal rate, regular rhythm, normal heart sounds and intact distal  pulses.   Pulmonary/Chest: Scattered wheezing. Effort normal. No respiratory distress. No rales.  Abdominal: Soft. Bowel sounds are normal. Exhibits no distension and no mass. There is no tenderness.  Musculoskeletal: Positive for back pain (improved). Normal range of motion. Exhibits no edema.  Lymphadenopathy:    No cervical adenopathy.  Neurological: Alert and oriented to person, place, and time. Exhibits normal muscle tone. Gait normal. Coordination normal.  Skin: Skin is warm and dry. No rash noted. Not diaphoretic. No erythema. No pallor.  Psychiatric: Mood, memory and judgment normal.  Vitals reviewed.  LABORATORY DATA: Lab Results  Component Value Date   WBC 3.7 (L) 08/08/2018   HGB 11.3 (L) 08/08/2018   HCT 36.3 08/08/2018   MCV 91.4 08/08/2018   PLT 302 08/08/2018      Chemistry      Component Value Date/Time   NA 141 08/08/2018 1034   K 4.0 08/08/2018 1034   CL 107 08/08/2018 1034   CO2 26 08/08/2018 1034   BUN 15 08/08/2018 1034   CREATININE 0.81 08/08/2018 1034      Component Value Date/Time   CALCIUM 9.0 08/08/2018  1034   ALKPHOS 116 08/08/2018 1034   AST 17 08/08/2018 1034   ALT 9 08/08/2018 1034   BILITOT 0.4 08/08/2018 1034       RADIOGRAPHIC STUDIES:  No results found.   ASSESSMENT/PLAN:  This is a very pleasant 73 year old Caucasian female with stage IIIa non-small cell lung cancer, poorly differentiated squamous cell carcinoma. She presented with a large superior segment of theleft lower lobe mass with questionable left hilar adenopathy. She was diagnosed in August 2019. The patient underwent a course of concurrent chemoradiation with carboplatin for an AUC of 2 and paclitaxel45 mg/m. Status post 7 cycles with a partial response.  The patient is currently undergoing consolidation immunotherapy with Imfinzi 10 mg/kg IV every 2 weeks. She is status post 16cycles of treatment. She continues to tolerate treatment well without any concerning  adverse effects.  Labs were reviewed with the patient. I recommend that she proceed with cycle #17 today as scheduled.   I will see her back for a follow up visit in 2 weeks for evaluation prior to starting cycle #18.  The patient was advised to call immediately if she has any concerning symptoms in the interval. The patient voices understanding of current disease status and treatment options and is in agreement with the current care plan. All questions were answered. The patient knows to call the clinic with any problems, questions or concerns. We can certainly see the patient much sooner if necessary  No orders of the defined types were placed in this encounter.    Cassandra L Heilingoetter, PA-C 08/08/18

## 2018-08-22 ENCOUNTER — Inpatient Hospital Stay: Payer: Medicare Other | Attending: Internal Medicine

## 2018-08-22 ENCOUNTER — Inpatient Hospital Stay (HOSPITAL_BASED_OUTPATIENT_CLINIC_OR_DEPARTMENT_OTHER): Payer: Medicare Other | Admitting: Physician Assistant

## 2018-08-22 ENCOUNTER — Inpatient Hospital Stay: Payer: Medicare Other

## 2018-08-22 ENCOUNTER — Encounter: Payer: Self-pay | Admitting: Physician Assistant

## 2018-08-22 ENCOUNTER — Other Ambulatory Visit: Payer: Self-pay

## 2018-08-22 VITALS — BP 119/68 | HR 72 | Temp 98.7°F | Resp 18 | Ht 61.0 in | Wt 80.8 lb

## 2018-08-22 DIAGNOSIS — C3492 Malignant neoplasm of unspecified part of left bronchus or lung: Secondary | ICD-10-CM | POA: Diagnosis not present

## 2018-08-22 DIAGNOSIS — J449 Chronic obstructive pulmonary disease, unspecified: Secondary | ICD-10-CM | POA: Insufficient documentation

## 2018-08-22 DIAGNOSIS — Z5112 Encounter for antineoplastic immunotherapy: Secondary | ICD-10-CM | POA: Insufficient documentation

## 2018-08-22 DIAGNOSIS — Z79899 Other long term (current) drug therapy: Secondary | ICD-10-CM | POA: Diagnosis not present

## 2018-08-22 DIAGNOSIS — F419 Anxiety disorder, unspecified: Secondary | ICD-10-CM | POA: Diagnosis not present

## 2018-08-22 DIAGNOSIS — C3432 Malignant neoplasm of lower lobe, left bronchus or lung: Secondary | ICD-10-CM | POA: Diagnosis present

## 2018-08-22 LAB — CMP (CANCER CENTER ONLY)
ALT: 12 U/L (ref 0–44)
AST: 20 U/L (ref 15–41)
Albumin: 3.7 g/dL (ref 3.5–5.0)
Alkaline Phosphatase: 117 U/L (ref 38–126)
Anion gap: 9 (ref 5–15)
BUN: 11 mg/dL (ref 8–23)
CO2: 26 mmol/L (ref 22–32)
Calcium: 9.1 mg/dL (ref 8.9–10.3)
Chloride: 105 mmol/L (ref 98–111)
Creatinine: 0.82 mg/dL (ref 0.44–1.00)
GFR, Est AFR Am: >60 mL/min (ref >60)
GFR, Estimated: >60 mL/min (ref >60)
Glucose, Bld: 76 mg/dL (ref 70–99)
Potassium: 4 mmol/L (ref 3.5–5.1)
Sodium: 140 mmol/L (ref 135–145)
Total Bilirubin: 0.3 mg/dL (ref 0.3–1.2)
Total Protein: 7.2 g/dL (ref 6.5–8.1)

## 2018-08-22 LAB — CBC WITH DIFFERENTIAL (CANCER CENTER ONLY)
Abs Immature Granulocytes: 0.01 10*3/uL (ref 0.00–0.07)
Basophils Absolute: 0.1 10*3/uL (ref 0.0–0.1)
Basophils Relative: 1 %
Eosinophils Absolute: 0.1 10*3/uL (ref 0.0–0.5)
Eosinophils Relative: 2 %
HCT: 38.7 % (ref 36.0–46.0)
Hemoglobin: 11.9 g/dL — ABNORMAL LOW (ref 12.0–15.0)
Immature Granulocytes: 0 %
Lymphocytes Relative: 21 %
Lymphs Abs: 1.2 10*3/uL (ref 0.7–4.0)
MCH: 27.7 pg (ref 26.0–34.0)
MCHC: 30.7 g/dL (ref 30.0–36.0)
MCV: 90.2 fL (ref 80.0–100.0)
Monocytes Absolute: 0.7 10*3/uL (ref 0.1–1.0)
Monocytes Relative: 12 %
Neutro Abs: 3.6 10*3/uL (ref 1.7–7.7)
Neutrophils Relative %: 64 %
Platelet Count: 296 10*3/uL (ref 150–400)
RBC: 4.29 MIL/uL (ref 3.87–5.11)
RDW: 14.4 % (ref 11.5–15.5)
WBC Count: 5.6 10*3/uL (ref 4.0–10.5)
nRBC: 0 % (ref 0.0–0.2)

## 2018-08-22 MED ORDER — SODIUM CHLORIDE 0.9 % IV SOLN
Freq: Once | INTRAVENOUS | Status: AC
  Administered 2018-08-22: 14:00:00 via INTRAVENOUS
  Filled 2018-08-22: qty 250

## 2018-08-22 MED ORDER — PREDNISONE 50 MG PO TABS: ORAL_TABLET | ORAL | 0 refills | Status: DC

## 2018-08-22 MED ORDER — SODIUM CHLORIDE 0.9 % IV SOLN
10.5000 mg/kg | Freq: Once | INTRAVENOUS | Status: AC
  Administered 2018-08-22: 15:00:00 360 mg via INTRAVENOUS
  Filled 2018-08-22: qty 7.2

## 2018-08-22 NOTE — Progress Notes (Signed)
[facility] OFFICE PROGRESS NOTE  [name], NP [place] [address]  DIAGNOSIS: Stage IIb/IIIa (T3, N0/N1, M0)non-small cell lung cancer poorly differentiated squamous cell carcinoma diagnosed in August 2019. The patient presented with large superior segment leftlower lobe mass with questionable left hilar adenopathy.  PRIOR THERAPY: Concurrent chemoradiation with weekly carboplatin for AUC of 2 and paclitaxel 45 mg/M2. Status post 7 cycles. Last dose was given October 29, 2017 with partial response.  CURRENT THERAPY: Consolidation treatment with immunotherapy with Imfinzi 10 mg/KG every 2 weeks to start December 13, 2017. Status post17cycles   INTERVAL HISTORY: Sherry [name] 73 y.o. female returns to the clinic for a follow-up visit.  The patient is feeling well today without any concerning complaints. She experienced an incident where she had a painless firm lump on her neck about 2 weeks ago that lasted 30 minutes. It was located on her left side of her neck. The patient saw her PCP regarding this issue. The patient denied any fever, sore throat, or cough. The swelling has not reoccurred at this time.   She has been tolerating her treatment with immunotherapy well without any adverse side effects.  She denies any fever, chills, night sweats, or weight loss.  She actually gained a few pounds since her last appointment.  She denies any chest pain, shortness of breath, cough, or hemoptysis.  She denies any nausea, vomiting, diarrhea, or constipation.  She denies any headache or visual changes.  Her back pain from her history of a compression fracture is significantly improved from prior. She is here today for evaluation before starting cycle #18.  MEDICAL HISTORY: Past Medical History:  Diagnosis Date  . Allergy   . Anxiety   . Arthritis   . Cavitating mass in left lower lung lobe   . Complication of anesthesia    woke up during  procedure  . COPD (chronic obstructive pulmonary disease) ([place])   . Depression   . Family history of adverse reaction to anesthesia    " my mother was always hard to wake up"  . Full dentures   . Headache   . Neuromuscular disorder ([place])   . Pneumonia   . Thoracic spine fracture ([place]) 11/01/2017   T9  . Wears glasses     ALLERGIES:  is allergic to omnipaque [iohexol] and penicillins.  MEDICATIONS:  Current Outpatient Medications  Medication Sig Dispense Refill  . acetaminophen (TYLENOL 8 HOUR) 650 MG CR tablet Take 1 tablet (650 mg total) by mouth every 8 (eight) hours as needed for pain or fever. 30 tablet 0  . alendronate (FOSAMAX) 70 MG tablet Take 70 mg by mouth once a week.    . gabapentin (NEURONTIN) 100 MG capsule Take 1 capsule (100 mg total) by mouth 3 (three) times daily. Take 1 tablet once a day 90 capsule 2  . HYDROcodone-acetaminophen (NORCO/VICODIN) 5-325 MG tablet TK 1-2 TS PO Q 6 H PRF MODERATE PAIN    . Multiple Vitamin (MULTIVITAMIN WITH MINERALS) TABS tablet Take 1 tablet by mouth daily.    Marland Kitchen guaifenesin (ROBITUSSIN) 100 MG/5ML syrup Take 200 mg by mouth 3 (three) times daily as needed for cough.     . Menthol-Ascorbic Acid (LUDENS COUGH DROPS MT) Use as directed 1 lozenge in the mouth or throat 3 (three) times daily as needed (cough).     . predniSONE (DELTASONE) 50 MG tablet Take 1 (50 mg) tablet 13 hours, 7 hours,  and 2 hours before your CT scan 3 tablet 0  . prochlorperazine (COMPAZINE) 10 MG tablet Take 1 tablet (10 mg total) by mouth every 6 (six) hours as needed for nausea or vomiting. (Patient not taking: Reported on 08/22/2018) 30 tablet 0   No current facility-administered medications for this visit.     SURGICAL HISTORY:  Past Surgical History:  Procedure Laterality Date  . ELBOW SURGERY     for  "Tennis Elbow"  . hand surgery    . IR KYPHO THORACIC WITH BONE BIOPSY  11/27/2017  . IR KYPHO THORACIC WITH BONE BIOPSY  03/01/2018  . IR KYPHO THORACIC  WITH BONE BIOPSY  03/26/2018  . IR RADIOLOGIST EVAL & MGMT  11/20/2017  . IR RADIOLOGIST EVAL & MGMT  02/26/2018  . IR RADIOLOGIST EVAL & MGMT  03/13/2018  . MULTIPLE TOOTH EXTRACTIONS    . TUBAL LIGATION    . VIDEO BRONCHOSCOPY N/A 08/31/2017   Procedure: VIDEO BRONCHOSCOPY;  Surgeon: Melrose Nakayama, MD;  Location: [facility] OR;  Service: Thoracic;  Laterality: N/A;    REVIEW OF SYSTEMS:   Review of Systems  Constitutional: Negative for appetite change, chills, fatigue, fever and unexpected weight change.  HENT: Positive for left cervical swelling (resolved). Negative for mouth sores, nosebleeds, sore throat and trouble swallowing.   Eyes: Negative for eye problems and icterus.  Respiratory: Negative for cough, hemoptysis, shortness of breath and wheezing.   Cardiovascular: Negative for chest pain and leg swelling.  Gastrointestinal: Negative for abdominal pain, constipation, diarrhea, nausea and vomiting.  Genitourinary: Negative for bladder incontinence, difficulty urinating, dysuria, frequency and hematuria.   Musculoskeletal: Positive for back pain (improved). Negative for gait problem, neck pain and neck stiffness.  Skin: Negative for itching and rash.  Neurological: Negative for dizziness, extremity weakness, gait problem, headaches, light-headedness and seizures.  Hematological: Negative for adenopathy. Does not bruise/bleed easily.  Psychiatric/Behavioral: Negative for confusion, depression and sleep disturbance. The patient is not nervous/anxious.     PHYSICAL EXAMINATION:  Blood pressure 119/68, pulse 72, temperature 98.7 F (37.1 C), temperature source Oral, resp. rate 18, height 5\' 1"  (1.549 m), weight 80 lb 12.8 oz (36.7 kg), SpO2 96 %.  ECOG PERFORMANCE STATUS: 1 - Symptomatic but completely ambulatory  Physical Exam  Constitutional: Oriented to person, place, and time and thin appearing female and in no distress.   HENT:  Head: Normocephalic and atraumatic.   Mouth/Throat: Oropharynx is clear and moist. No oropharyngeal exudate.  Eyes: Conjunctivae are normal. Right eye exhibits no discharge. Left eye exhibits no discharge. No scleral icterus.  Neck: Normal range of motion. Neck supple.  Cardiovascular: Normal rate, regular rhythm, normal heart sounds and intact distal pulses.   Pulmonary/Chest: Effort normal. Scattered wheezing.  No respiratory distress. No rales.  Abdominal: Soft. Bowel sounds are normal. Exhibits no distension and no mass. There is no tenderness.  Musculoskeletal: Normal range of motion. Exhibits no edema.  Lymphadenopathy:    No cervical adenopathy.  Neurological: Alert and oriented to person, place, and time. Exhibits normal muscle tone. Gait normal. Coordination normal.  Skin: Skin is warm and dry. No rash noted. Not diaphoretic. No erythema. No pallor.  Psychiatric: Mood, memory and judgment normal.  Vitals reviewed.  LABORATORY DATA: Lab Results  Component Value Date   WBC 3.7 (L) 08/08/2018   HGB 11.3 (L) 08/08/2018   HCT 36.3 08/08/2018   MCV 91.4 08/08/2018   PLT 302 08/08/2018      Chemistry  Component Value Date/Time   NA 140 08/22/2018 1227   K 4.0 08/22/2018 1227   CL 105 08/22/2018 1227   CO2 26 08/22/2018 1227   BUN 11 08/22/2018 1227   CREATININE 0.82 08/22/2018 1227      Component Value Date/Time   CALCIUM 9.1 08/22/2018 1227   ALKPHOS 117 08/22/2018 1227   AST 20 08/22/2018 1227   ALT 12 08/22/2018 1227   BILITOT 0.3 08/22/2018 1227       RADIOGRAPHIC STUDIES:  No results found.   ASSESSMENT/PLAN:  This is a very pleasant 73 year old Caucasian female with stage IIIa non-small cell lung cancer, poorly differentiated squamous cell carcinoma. She presented with a large superior segment of theleft lower lobe mass with questionable left hilar adenopathy. She was diagnosed in August 2019. The patient underwent a course of concurrent chemoradiation with carboplatin for an AUC of 2  and paclitaxel45 mg/m. Status post 7 cycles with a partial response.  The patient is currently undergoing consolidation immunotherapy with Imfinzi 10 mg/kg IV every 2 weeks. She is status post 17cycles of treatment. She continues to tolerate treatment well without any concerning adverse effects.  The patient was seen with Dr. Julien Nordmann. Labs were reviewed with the patient. I recommend that she proceed with cycle #18today as scheduled.   I will arrange for a restaging CT scan to be performed prior to her next visit. She developed itching after her last scan which went away after an hour without any intervention. We will send pre-medications with 50 mg of prednisone to be taken 13 hours, 7 hours, and 2 hours before her CT scan. She also was instructed to take 25 mg of benadryl 2 hours before her scan.   I will see her back for a follow up visit in 2 weeks for evaluation and to review her scan results prior to starting cycle #19.  The patient was advised to seek medical evaluation if she develops recurring symptoms of throat swelling so it can be evaluated.   The patient was advised to call immediately if she has any concerning symptoms in the interval. The patient voices understanding of current disease status and treatment options and is in agreement with the current care plan. All questions were answered. The patient knows to call the clinic with any problems, questions or concerns. We can certainly see the patient much sooner if necessary  Orders Placed This Encounter  Procedures  . CT Chest W Contrast    Standing Status:   Future    Standing Expiration Date:   08/22/2019    Order Specific Question:   ** REASON FOR EXAM (FREE TEXT)    Answer:   Restaging Lung Cancer    Order Specific Question:   If indicated for the ordered procedure, I authorize the administration of contrast media per Radiology protocol    Answer:   Yes    Order Specific Question:   Preferred imaging location?     Answer:   [facility]    Order Specific Question:   Radiology Contrast Protocol - do NOT remove file path    Answer:   [email or web address]     Brylin Stanislawski L Bucky Grigg, PA-C 08/22/18  ADDENDUM: Hematology/Oncology Attending: I had a face-to-face encounter with the patient today.  I recommended her care plan.  This is a very pleasant 73 years old white female with stage IIIa non-small cell lung cancer, squamous cell carcinoma status post concurrent chemoradiation and she is currently undergoing consolidation treatment with immunotherapy with  Imfinzi every 2 weeks status post 17 cycles.  The patient has been tolerating this treatment well with no concerning complaints.  She had the swelling in the left parotid gland area likely secondary to obstruction of the salivary gland duct but this was resolved with after the patient used mouthwash with salt water. I recommended for the patient to proceed with cycle #18 today as planned. We will see her back for follow-up visit in 2 weeks for evaluation with repeat CT scan of the chest for restaging of her disease. She was advised to call immediately if she has any concerning symptoms in the interval.  Disclaimer: This note was dictated with voice recognition software. Similar sounding words can inadvertently be transcribed and may be missed upon review. Eilleen Kempf, MD 08/22/18

## 2018-08-22 NOTE — Patient Instructions (Signed)
[facility] Discharge Instructions for Patients Receiving Chemotherapy  Today you received the following chemotherapy agents: Imfinzi.  To help prevent nausea and vomiting after your treatment, we encourage you to take your nausea medication as directed.   If you develop nausea and vomiting that is not controlled by your nausea medication, call the clinic.   BELOW ARE SYMPTOMS THAT SHOULD BE REPORTED IMMEDIATELY:  *FEVER GREATER THAN 100.5 F  *CHILLS WITH OR WITHOUT FEVER  NAUSEA AND VOMITING THAT IS NOT CONTROLLED WITH YOUR NAUSEA MEDICATION  *UNUSUAL SHORTNESS OF BREATH  *UNUSUAL BRUISING OR BLEEDING  TENDERNESS IN MOUTH AND THROAT WITH OR WITHOUT PRESENCE OF ULCERS  *URINARY PROBLEMS  *BOWEL PROBLEMS  UNUSUAL RASH Items with * indicate a potential emergency and should be followed up as soon as possible.  Feel free to call the clinic should you have any questions or concerns. The clinic phone number is [phone].  Please show the CHEMO ALERT CARD at check-in to the Emergency Department and triage nurse.   

## 2018-09-02 ENCOUNTER — Other Ambulatory Visit: Payer: Self-pay

## 2018-09-02 ENCOUNTER — Ambulatory Visit (HOSPITAL_COMMUNITY)
Admission: RE | Admit: 2018-09-02 | Discharge: 2018-09-02 | Disposition: A | Discharge status: Home / Self Care | Payer: Medicare Other | Source: Ambulatory Visit | Attending: Physician Assistant | Admitting: Physician Assistant

## 2018-09-02 DIAGNOSIS — C3492 Malignant neoplasm of unspecified part of left bronchus or lung: Secondary | ICD-10-CM | POA: Diagnosis not present

## 2018-09-02 MED ORDER — IOHEXOL 300 MG/ML  SOLN
75.0000 mL | Freq: Once | INTRAMUSCULAR | Status: AC | PRN
  Administered 2018-09-02: 15:00:00 75 mL via INTRAVENOUS

## 2018-09-02 MED ORDER — SODIUM CHLORIDE (PF) 0.9 % IJ SOLN
INTRAMUSCULAR | Status: AC
  Filled 2018-09-02: qty 50

## 2018-09-05 ENCOUNTER — Other Ambulatory Visit: Payer: Medicare Other

## 2018-09-05 ENCOUNTER — Encounter: Payer: Self-pay | Admitting: Internal Medicine

## 2018-09-05 ENCOUNTER — Inpatient Hospital Stay: Payer: Medicare Other

## 2018-09-05 ENCOUNTER — Other Ambulatory Visit: Payer: Self-pay

## 2018-09-05 ENCOUNTER — Inpatient Hospital Stay (HOSPITAL_BASED_OUTPATIENT_CLINIC_OR_DEPARTMENT_OTHER): Payer: Medicare Other | Admitting: Internal Medicine

## 2018-09-05 VITALS — BP 119/74 | HR 97 | Temp 98.7°F | Resp 18 | Ht 61.0 in | Wt 76.9 lb

## 2018-09-05 DIAGNOSIS — C3492 Malignant neoplasm of unspecified part of left bronchus or lung: Secondary | ICD-10-CM

## 2018-09-05 DIAGNOSIS — Z5112 Encounter for antineoplastic immunotherapy: Secondary | ICD-10-CM

## 2018-09-05 DIAGNOSIS — C3432 Malignant neoplasm of lower lobe, left bronchus or lung: Secondary | ICD-10-CM

## 2018-09-05 LAB — CMP (CANCER CENTER ONLY)
ALT: 8 U/L (ref 0–44)
AST: 17 U/L (ref 15–41)
Albumin: 3.9 g/dL (ref 3.5–5.0)
Alkaline Phosphatase: 117 U/L (ref 38–126)
Anion gap: 10 (ref 5–15)
BUN: 9 mg/dL (ref 8–23)
CO2: 26 mmol/L (ref 22–32)
Calcium: 9.1 mg/dL (ref 8.9–10.3)
Chloride: 104 mmol/L (ref 98–111)
Creatinine: 0.8 mg/dL (ref 0.44–1.00)
GFR, Est AFR Am: >60 mL/min (ref >60)
GFR, Estimated: >60 mL/min (ref >60)
Glucose, Bld: 103 mg/dL — ABNORMAL HIGH (ref 70–99)
Potassium: 3.8 mmol/L (ref 3.5–5.1)
Sodium: 140 mmol/L (ref 135–145)
Total Bilirubin: 0.3 mg/dL (ref 0.3–1.2)
Total Protein: 7.6 g/dL (ref 6.5–8.1)

## 2018-09-05 LAB — CBC WITH DIFFERENTIAL (CANCER CENTER ONLY)
Abs Immature Granulocytes: 0.01 10*3/uL (ref 0.00–0.07)
Basophils Absolute: 0.1 10*3/uL (ref 0.0–0.1)
Basophils Relative: 1 %
Eosinophils Absolute: 0.1 10*3/uL (ref 0.0–0.5)
Eosinophils Relative: 2 %
HCT: 40.4 % (ref 36.0–46.0)
Hemoglobin: 12.7 g/dL (ref 12.0–15.0)
Immature Granulocytes: 0 %
Lymphocytes Relative: 25 %
Lymphs Abs: 1.3 10*3/uL (ref 0.7–4.0)
MCH: 28 pg (ref 26.0–34.0)
MCHC: 31.4 g/dL (ref 30.0–36.0)
MCV: 89 fL (ref 80.0–100.0)
Monocytes Absolute: 0.6 10*3/uL (ref 0.1–1.0)
Monocytes Relative: 11 %
Neutro Abs: 3.1 10*3/uL (ref 1.7–7.7)
Neutrophils Relative %: 61 %
Platelet Count: 344 10*3/uL (ref 150–400)
RBC: 4.54 MIL/uL (ref 3.87–5.11)
RDW: 14.1 % (ref 11.5–15.5)
WBC Count: 5.1 10*3/uL (ref 4.0–10.5)
nRBC: 0 % (ref 0.0–0.2)

## 2018-09-05 LAB — TSH: TSH: 1.732 u[IU]/mL (ref 0.308–3.960)

## 2018-09-05 MED ORDER — SODIUM CHLORIDE 0.9 % IV SOLN
10.5000 mg/kg | Freq: Once | INTRAVENOUS | Status: AC
  Administered 2018-09-05: 14:00:00 360 mg via INTRAVENOUS
  Filled 2018-09-05: qty 7.2

## 2018-09-05 MED ORDER — SODIUM CHLORIDE 0.9 % IV SOLN
Freq: Once | INTRAVENOUS | Status: AC
  Administered 2018-09-05: 13:00:00 via INTRAVENOUS
  Filled 2018-09-05: qty 250

## 2018-09-05 NOTE — Progress Notes (Signed)
[place] [phone]   [phone]  OFFICE PROGRESS NOTE  [name], NP [place] [address]  DIAGNOSIS: Stage IIb/IIIa (T3, N0/N1, M0) non-small cell lung cancer poorly differentiated squamous cell carcinoma diagnosed in August 2019.  The patient presented with large superior segment left lower lobe mass with questionable left hilar adenopathy.  PRIOR THERAPY: Concurrent chemoradiation with weekly carboplatin for AUC of 2 and paclitaxel 45 mg/M2.  Status post 7 cycles.  Last dose was given October 29, 2017 with partial response.  CURRENT THERAPY: Consolidation treatment with immunotherapy with Imfinzi 10 mg/KG every 2 weeks to start December 13, 2017.  Status post 18 cycles.  INTERVAL HISTORY: Sherry [name] 73 y.o. female returns to the clinic today for follow-up visit.  The patient is feeling fine today with no concerning complaints.  She denied having any chest pain, shortness of breath, cough or hemoptysis.  She denied having any fever or chills.  She has no nausea, vomiting, diarrhea or constipation.  She denied having any headache or visual changes.  She has been tolerating her treatment with Imfinzi fairly well.  The patient had repeat CT scan of the chest performed recently and she is here for evaluation and discussion of her scan results.  MEDICAL HISTORY: Past Medical History:  Diagnosis Date  . Allergy   . Anxiety   . Arthritis   . Cavitating mass in left lower lung lobe   . Complication of anesthesia    woke up during procedure  . COPD (chronic obstructive pulmonary disease) ([place])   . Depression   . Family history of adverse reaction to anesthesia    " my mother was always hard to wake up"  . Full dentures   . Headache   . Neuromuscular disorder ([place])   . Pneumonia   . Thoracic spine fracture ([place]) 11/01/2017   T9  . Wears glasses     ALLERGIES:  is allergic to omnipaque [iohexol] and  penicillins.  MEDICATIONS:  Current Outpatient Medications  Medication Sig Dispense Refill  . acetaminophen (TYLENOL 8 HOUR) 650 MG CR tablet Take 1 tablet (650 mg total) by mouth every 8 (eight) hours as needed for pain or fever. 30 tablet 0  . alendronate (FOSAMAX) 70 MG tablet Take 70 mg by mouth once a week.    . gabapentin (NEURONTIN) 100 MG capsule Take 1 capsule (100 mg total) by mouth 3 (three) times daily. Take 1 tablet once a day 90 capsule 2  . guaifenesin (ROBITUSSIN) 100 MG/5ML syrup Take 200 mg by mouth 3 (three) times daily as needed for cough.     [place] Kitchen HYDROcodone-acetaminophen (NORCO/VICODIN) 5-325 MG tablet TK 1-2 TS PO Q 6 H PRF MODERATE PAIN    . Menthol-Ascorbic Acid (LUDENS COUGH DROPS MT) Use as directed 1 lozenge in the mouth or throat 3 (three) times daily as needed (cough).     . Multiple Vitamin (MULTIVITAMIN WITH MINERALS) TABS tablet Take 1 tablet by mouth daily.    . predniSONE (DELTASONE) 50 MG tablet Take 1 (50 mg) tablet 13 hours, 7 hours, and 2 hours before your CT scan 3 tablet 0  . prochlorperazine (COMPAZINE) 10 MG tablet Take 1 tablet (10 mg total) by mouth every 6 (six) hours as needed for nausea or vomiting. (Patient not taking: Reported on 08/22/2018) 30 tablet 0   No current facility-administered medications for this visit.     SURGICAL  HISTORY:  Past Surgical History:  Procedure Laterality Date  . ELBOW SURGERY     for  "Tennis Elbow"  . hand surgery    . IR KYPHO THORACIC WITH BONE BIOPSY  11/27/2017  . IR KYPHO THORACIC WITH BONE BIOPSY  03/01/2018  . IR KYPHO THORACIC WITH BONE BIOPSY  03/26/2018  . IR RADIOLOGIST EVAL & MGMT  11/20/2017  . IR RADIOLOGIST EVAL & MGMT  02/26/2018  . IR RADIOLOGIST EVAL & MGMT  03/13/2018  . MULTIPLE TOOTH EXTRACTIONS    . TUBAL LIGATION    . VIDEO BRONCHOSCOPY N/A 08/31/2017   Procedure: VIDEO BRONCHOSCOPY;  Surgeon: Melrose Nakayama, MD;  Location: [facility] OR;  Service: Thoracic;  Laterality: N/A;    REVIEW OF  SYSTEMS:  Constitutional: negative Eyes: negative Ears, nose, mouth, throat, and face: negative Respiratory: positive for wheezing Cardiovascular: negative Gastrointestinal: negative Genitourinary:negative Integument/breast: negative Hematologic/lymphatic: negative Musculoskeletal:negative Neurological: negative Behavioral/Psych: negative Endocrine: negative Allergic/Immunologic: negative   PHYSICAL EXAMINATION: General appearance: alert, cooperative and no distress Head: Normocephalic, without obvious abnormality, atraumatic Neck: no adenopathy, no JVD, supple, symmetrical, trachea midline and thyroid not enlarged, symmetric, no tenderness/mass/nodules Lymph nodes: Cervical, supraclavicular, and axillary nodes normal. Resp: clear to auscultation bilaterally Back: symmetric, no curvature. ROM normal. No CVA tenderness. Cardio: regular rate and rhythm, S1, S2 normal, no murmur, click, rub or gallop GI: soft, non-tender; bowel sounds normal; no masses,  no organomegaly Extremities: extremities normal, atraumatic, no cyanosis or edema Neurologic: Alert and oriented X 3, normal strength and tone. Normal symmetric reflexes. Normal coordination and gait  ECOG PERFORMANCE STATUS: 1 - Symptomatic but completely ambulatory  Blood pressure 119/74, pulse 97, temperature 98.7 F (37.1 C), temperature source Oral, resp. rate 18, height 5\' 1"  (1.549 m), weight 76 lb 14.4 oz (34.9 kg), SpO2 97 %.  LABORATORY DATA: Lab Results  Component Value Date   WBC 5.1 09/05/2018   HGB 12.7 09/05/2018   HCT 40.4 09/05/2018   MCV 89.0 09/05/2018   PLT 344 09/05/2018      Chemistry      Component Value Date/Time   NA 140 08/22/2018 1227   K 4.0 08/22/2018 1227   CL 105 08/22/2018 1227   CO2 26 08/22/2018 1227   BUN 11 08/22/2018 1227   CREATININE 0.82 08/22/2018 1227      Component Value Date/Time   CALCIUM 9.1 08/22/2018 1227   ALKPHOS 117 08/22/2018 1227   AST 20 08/22/2018 1227   ALT 12  08/22/2018 1227   BILITOT 0.3 08/22/2018 1227       RADIOGRAPHIC STUDIES: Ct Chest W Contrast  Result Date: 09/02/2018 CLINICAL DATA:  Restaging lung cancer EXAM: CT CHEST WITH CONTRAST TECHNIQUE: Multidetector CT imaging of the chest was performed during intravenous contrast administration. CONTRAST:  88mL OMNIPAQUE IOHEXOL 300 MG/ML  SOLN COMPARISON:  06/11/2018, 03/19/2018, 01/25/2018 FINDINGS: Cardiovascular: Minimal aortic atherosclerosis. Scattered coronary artery calcifications. Normal heart size. No pericardial effusion. Mediastinum/Nodes: No enlarged mediastinal, hilar, or axillary lymph nodes. Unchanged left hilar soft tissue thickening. Thyroid gland, trachea, and esophagus demonstrate no significant findings. Lungs/Pleura: Severe centrilobular emphysema. No significant change in dense, post treatment consolidation of the infrahilar left lower lobe. Unchanged small, likely loculated left pleural effusion. No pleural effusion or pneumothorax. Upper Abdomen: No acute abnormality. Musculoskeletal: No chest wall mass or suspicious bone lesions identified. Status post T9-T11 kyphoplasty. IMPRESSION: 1. No significant change in dense, post treatment consolidation of the infrahilar left lower lobe and soft tissue thickening of the left  hilum without discrete lymphadenopathy. Unchanged small, likely loculated left pleural effusion. 2. Stable 7 mm pulmonary nodule of the right lower lobe (series 5, image 60). Attention on follow-up. 3.  Severe emphysema. 4.  Coronary artery disease and aortic atherosclerosis. Electronically Signed   By: Eddie Candle M.D.   On: 09/02/2018 15:59    ASSESSMENT AND PLAN: This is a very pleasant 73 years old white female with a stage IIIa non-small cell lung cancer, poorly differentiated squamous cell carcinoma.   The patient underwent a course of concurrent chemoradiation with weekly carboplatin and paclitaxel status post 7 cycles with partial response.  The patient is  currently undergoing consolidation treatment with immunotherapy with Imfinzi status post 18 cycles.  The patient has been tolerating her treatment with Imfinzi fairly well with no concerning adverse effects. She had repeat CT scan of the chest performed recently.  I personally and independently reviewed the scan and discussed the results with the patient today. Her scan showed no concerning findings for disease progression. I recommended for her to continue her current treatment with Imfinzi and she will proceed with cycle #19 today. She will come back for follow-up visit in 2 weeks for evaluation before the next cycle of her treatment. She was advised to call immediately if she has any concerning symptoms in the interval. The patient voices understanding of current disease status and treatment options and is in agreement with the current care plan. All questions were answered. The patient knows to call the clinic with any problems, questions or concerns. We can certainly see the patient much sooner if necessary.  Disclaimer: This note was dictated with voice recognition software. Similar sounding words can inadvertently be transcribed and may not be corrected upon review.

## 2018-09-05 NOTE — Patient Instructions (Signed)
[facility] Discharge Instructions for Patients Receiving Chemotherapy  Today you received the following chemotherapy agents: Imfinzi.  To help prevent nausea and vomiting after your treatment, we encourage you to take your nausea medication as directed.   If you develop nausea and vomiting that is not controlled by your nausea medication, call the clinic.   BELOW ARE SYMPTOMS THAT SHOULD BE REPORTED IMMEDIATELY:  *FEVER GREATER THAN 100.5 F  *CHILLS WITH OR WITHOUT FEVER  NAUSEA AND VOMITING THAT IS NOT CONTROLLED WITH YOUR NAUSEA MEDICATION  *UNUSUAL SHORTNESS OF BREATH  *UNUSUAL BRUISING OR BLEEDING  TENDERNESS IN MOUTH AND THROAT WITH OR WITHOUT PRESENCE OF ULCERS  *URINARY PROBLEMS  *BOWEL PROBLEMS  UNUSUAL RASH Items with * indicate a potential emergency and should be followed up as soon as possible.  Feel free to call the clinic should you have any questions or concerns. The clinic phone number is [phone].  Please show the CHEMO ALERT CARD at check-in to the Emergency Department and triage nurse.   

## 2018-09-19 ENCOUNTER — Inpatient Hospital Stay: Payer: Medicare Other | Attending: Internal Medicine

## 2018-09-19 ENCOUNTER — Inpatient Hospital Stay (HOSPITAL_BASED_OUTPATIENT_CLINIC_OR_DEPARTMENT_OTHER): Payer: Medicare Other | Admitting: Physician Assistant

## 2018-09-19 ENCOUNTER — Other Ambulatory Visit: Payer: Self-pay

## 2018-09-19 ENCOUNTER — Inpatient Hospital Stay: Payer: Medicare Other

## 2018-09-19 VITALS — BP 124/62 | HR 106 | Temp 99.1°F | Resp 17 | Ht 61.0 in | Wt 75.3 lb

## 2018-09-19 VITALS — HR 97

## 2018-09-19 DIAGNOSIS — Z5112 Encounter for antineoplastic immunotherapy: Secondary | ICD-10-CM

## 2018-09-19 DIAGNOSIS — Z923 Personal history of irradiation: Secondary | ICD-10-CM | POA: Insufficient documentation

## 2018-09-19 DIAGNOSIS — Z9221 Personal history of antineoplastic chemotherapy: Secondary | ICD-10-CM | POA: Diagnosis not present

## 2018-09-19 DIAGNOSIS — C3432 Malignant neoplasm of lower lobe, left bronchus or lung: Secondary | ICD-10-CM | POA: Diagnosis present

## 2018-09-19 DIAGNOSIS — C3492 Malignant neoplasm of unspecified part of left bronchus or lung: Secondary | ICD-10-CM

## 2018-09-19 DIAGNOSIS — J449 Chronic obstructive pulmonary disease, unspecified: Secondary | ICD-10-CM | POA: Insufficient documentation

## 2018-09-19 DIAGNOSIS — F419 Anxiety disorder, unspecified: Secondary | ICD-10-CM | POA: Insufficient documentation

## 2018-09-19 DIAGNOSIS — Z79899 Other long term (current) drug therapy: Secondary | ICD-10-CM | POA: Insufficient documentation

## 2018-09-19 LAB — CMP (CANCER CENTER ONLY)
ALT: 6 U/L (ref 0–44)
AST: 19 U/L (ref 15–41)
Albumin: 4.1 g/dL (ref 3.5–5.0)
Alkaline Phosphatase: 119 U/L (ref 38–126)
Anion gap: 9 (ref 5–15)
BUN: 9 mg/dL (ref 8–23)
CO2: 27 mmol/L (ref 22–32)
Calcium: 9.3 mg/dL (ref 8.9–10.3)
Chloride: 105 mmol/L (ref 98–111)
Creatinine: 0.79 mg/dL (ref 0.44–1.00)
GFR, Est AFR Am: >60 mL/min (ref >60)
GFR, Estimated: >60 mL/min (ref >60)
Glucose, Bld: 100 mg/dL — ABNORMAL HIGH (ref 70–99)
Potassium: 3.8 mmol/L (ref 3.5–5.1)
Sodium: 141 mmol/L (ref 135–145)
Total Bilirubin: 0.3 mg/dL (ref 0.3–1.2)
Total Protein: 7.8 g/dL (ref 6.5–8.1)

## 2018-09-19 LAB — CBC WITH DIFFERENTIAL (CANCER CENTER ONLY)
Abs Immature Granulocytes: 0.01 10*3/uL (ref 0.00–0.07)
Basophils Absolute: 0.1 10*3/uL (ref 0.0–0.1)
Basophils Relative: 1 %
Eosinophils Absolute: 0 10*3/uL (ref 0.0–0.5)
Eosinophils Relative: 1 %
HCT: 43.6 % (ref 36.0–46.0)
Hemoglobin: 13.6 g/dL (ref 12.0–15.0)
Immature Granulocytes: 0 %
Lymphocytes Relative: 15 %
Lymphs Abs: 1 10*3/uL (ref 0.7–4.0)
MCH: 27.9 pg (ref 26.0–34.0)
MCHC: 31.2 g/dL (ref 30.0–36.0)
MCV: 89.3 fL (ref 80.0–100.0)
Monocytes Absolute: 0.6 10*3/uL (ref 0.1–1.0)
Monocytes Relative: 9 %
Neutro Abs: 5 10*3/uL (ref 1.7–7.7)
Neutrophils Relative %: 74 %
Platelet Count: 306 10*3/uL (ref 150–400)
RBC: 4.88 MIL/uL (ref 3.87–5.11)
RDW: 14.1 % (ref 11.5–15.5)
WBC Count: 6.7 10*3/uL (ref 4.0–10.5)
nRBC: 0 % (ref 0.0–0.2)

## 2018-09-19 MED ORDER — SODIUM CHLORIDE 0.9 % IV SOLN
10.5000 mg/kg | Freq: Once | INTRAVENOUS | Status: AC
  Administered 2018-09-19: 17:00:00 360 mg via INTRAVENOUS
  Filled 2018-09-19: qty 7.2

## 2018-09-19 MED ORDER — SODIUM CHLORIDE 0.9 % IV SOLN
Freq: Once | INTRAVENOUS | Status: AC
  Administered 2018-09-19: 16:00:00 via INTRAVENOUS
  Filled 2018-09-19: qty 250

## 2018-09-19 NOTE — Patient Instructions (Signed)
[facility] Discharge Instructions for Patients Receiving Chemotherapy  Today you received the following chemotherapy agents: Imfinzi.  To help prevent nausea and vomiting after your treatment, we encourage you to take your nausea medication as directed.   If you develop nausea and vomiting that is not controlled by your nausea medication, call the clinic.   BELOW ARE SYMPTOMS THAT SHOULD BE REPORTED IMMEDIATELY:  *FEVER GREATER THAN 100.5 F  *CHILLS WITH OR WITHOUT FEVER  NAUSEA AND VOMITING THAT IS NOT CONTROLLED WITH YOUR NAUSEA MEDICATION  *UNUSUAL SHORTNESS OF BREATH  *UNUSUAL BRUISING OR BLEEDING  TENDERNESS IN MOUTH AND THROAT WITH OR WITHOUT PRESENCE OF ULCERS  *URINARY PROBLEMS  *BOWEL PROBLEMS  UNUSUAL RASH Items with * indicate a potential emergency and should be followed up as soon as possible.  Feel free to call the clinic should you have any questions or concerns. The clinic phone number is [phone].  Please show the CHEMO ALERT CARD at check-in to the Emergency Department and triage nurse.   

## 2018-09-19 NOTE — Progress Notes (Signed)
[place] OFFICE PROGRESS NOTE  [name], NP [place] [address]  DIAGNOSIS: Stage IIb/IIIa (T3, N0/N1, M0)non-small cell lung cancer poorly differentiated squamous cell carcinoma diagnosed in August 2019. The patient presented with large superior segment leftlower lobe mass with questionable left hilar adenopathy.  PRIOR THERAPY: Concurrent chemoradiation with weekly carboplatin for AUC of 2 and paclitaxel 45 mg/M2. Status post 7 cycles. Last dose was given October 29, 2017 with partial response  CURRENT THERAPY:  Consolidation treatment with immunotherapy with Imfinzi 10 mg/KG every 2 weeks to start December 13, 2017. Status post19cycles   INTERVAL HISTORY: Sherry [name] 73 y.o. female returns to the clinic for a follow up visit. The patient is feeling well today without any concerning complaints except for a dry mouth for which she tries to drink more water and use mouth rinses. She is tolerating her treatment with Imfinzi well without any concerning adverse effects. She denies any fever, chills, night sweats, or weight loss. She denies any chest pain, shortness of breath, cough, or hemoptysis. She denies any nausea, vomiting, diarrhea, or constipation. She denies any headaches or visual changes. She denies any rashes or skin changes. She is here today for evaluation before starting cycle #20.   MEDICAL HISTORY: Past Medical History:  Diagnosis Date  . Allergy   . Anxiety   . Arthritis   . Cavitating mass in left lower lung lobe   . Complication of anesthesia    woke up during procedure  . COPD (chronic obstructive pulmonary disease) ([place])   . Depression   . Family history of adverse reaction to anesthesia    " my mother was always hard to wake up"  . Full dentures   . Headache   . Neuromuscular disorder ([place])   . Pneumonia   . Thoracic spine fracture ([place]) 11/01/2017   T9  . Wears glasses     ALLERGIES:  is allergic  to omnipaque [iohexol] and penicillins.  MEDICATIONS:  Current Outpatient Medications  Medication Sig Dispense Refill  . acetaminophen (TYLENOL 8 HOUR) 650 MG CR tablet Take 1 tablet (650 mg total) by mouth every 8 (eight) hours as needed for pain or fever. 30 tablet 0  . alendronate (FOSAMAX) 70 MG tablet Take 70 mg by mouth once a week.    . gabapentin (NEURONTIN) 100 MG capsule Take 1 capsule (100 mg total) by mouth 3 (three) times daily. Take 1 tablet once a day 90 capsule 2  . guaifenesin (ROBITUSSIN) 100 MG/5ML syrup Take 200 mg by mouth 3 (three) times daily as needed for cough.     [place] Kitchen HYDROcodone-acetaminophen (NORCO/VICODIN) 5-325 MG tablet TK 1-2 TS PO Q 6 H PRF MODERATE PAIN    . Menthol-Ascorbic Acid (LUDENS COUGH DROPS MT) Use as directed 1 lozenge in the mouth or throat 3 (three) times daily as needed (cough).     . Multiple Vitamin (MULTIVITAMIN WITH MINERALS) TABS tablet Take 1 tablet by mouth daily.    . predniSONE (DELTASONE) 50 MG tablet Take 1 (50 mg) tablet 13 hours, 7 hours, and 2 hours before your CT scan 3 tablet 0  . prochlorperazine (COMPAZINE) 10 MG tablet Take 1 tablet (10 mg total) by mouth every 6 (six) hours as needed for nausea or vomiting. (Patient not taking: Reported on 08/22/2018) 30 tablet 0   No current facility-administered medications for this visit.    Facility-Administered Medications Ordered in Other Visits  Medication Dose  Route Frequency Provider Last Rate Last Dose  . durvalumab (IMFINZI) 360 mg in sodium chloride 0.9 % 100 mL chemo infusion  10.5 mg/kg (Treatment Plan Recorded) Intravenous Once Curt Bears, MD 107 mL/hr at 09/19/18 1702 360 mg at 09/19/18 1702    SURGICAL HISTORY:  Past Surgical History:  Procedure Laterality Date  . ELBOW SURGERY     for  "Tennis Elbow"  . hand surgery    . IR KYPHO THORACIC WITH BONE BIOPSY  11/27/2017  . IR KYPHO THORACIC WITH BONE BIOPSY  03/01/2018  . IR KYPHO THORACIC WITH BONE BIOPSY  03/26/2018  .  IR RADIOLOGIST EVAL & MGMT  11/20/2017  . IR RADIOLOGIST EVAL & MGMT  02/26/2018  . IR RADIOLOGIST EVAL & MGMT  03/13/2018  . MULTIPLE TOOTH EXTRACTIONS    . TUBAL LIGATION    . VIDEO BRONCHOSCOPY N/A 08/31/2017   Procedure: VIDEO BRONCHOSCOPY;  Surgeon: Melrose Nakayama, MD;  Location: [facility] OR;  Service: Thoracic;  Laterality: N/A;    REVIEW OF SYSTEMS:   Review of Systems  Constitutional: Positive for fatigue. Negative for appetite change, chills,  fever and unexpected weight change.  HENT: Positive for dry mouth. Negative for mouth sores, nosebleeds, sore throat and trouble swallowing.   Eyes: Negative for eye problems and icterus.  Respiratory: Positive for wheezing. Negative for cough, hemoptysis, and shortness of breath Cardiovascular: Negative for chest pain and leg swelling.  Gastrointestinal: Negative for abdominal pain, constipation, diarrhea, nausea and vomiting.  Genitourinary: Negative for bladder incontinence, difficulty urinating, dysuria, frequency and hematuria.   Musculoskeletal: Positive for back pain (improved). Negative for gait problem, neck pain and neck stiffness.  Skin: Negative for itching and rash.  Neurological: Negative for dizziness, extremity weakness, gait problem, headaches, light-headedness and seizures.  Hematological: Negative for adenopathy. Does not bruise/bleed easily.  Psychiatric/Behavioral: Negative for confusion, depression and sleep disturbance. The patient is not nervous/anxious.      PHYSICAL EXAMINATION:  Blood pressure 124/62, pulse (!) 106, temperature 99.1 F (37.3 C), temperature source Oral, resp. rate 17, height 5\' 1"  (1.549 m), weight 75 lb 4.8 oz (34.2 kg), SpO2 94 %.  ECOG PERFORMANCE STATUS: 1 - Symptomatic but completely ambulatory  Physical Exam  Constitutional: Oriented to person, place, and time and thin appearing female and in no distress.   HENT:  Head: Normocephalic and atraumatic.  Mouth/Throat: Oropharynx is clear and  moist. No oropharyngeal exudate.  Eyes: Conjunctivae are normal. Right eye exhibits no discharge. Left eye exhibits no discharge. No scleral icterus.  Neck: Normal range of motion. Neck supple.  Cardiovascular: Normal rate, regular rhythm, normal heart sounds and intact distal pulses.   Pulmonary/Chest: Effort normal. Scattered wheezing. No respiratory distress. No rales.  Abdominal: Soft. Bowel sounds are normal. Exhibits no distension and no mass. There is no tenderness.  Musculoskeletal: Normal range of motion. Exhibits no edema.  Lymphadenopathy:    No cervical adenopathy.  Neurological: Alert and oriented to person, place, and time. Exhibits normal muscle tone. Gait normal. Coordination normal.  Skin: Skin is warm and dry. No rash noted. Not diaphoretic. No erythema. No pallor.  Psychiatric: Mood, memory and judgment normal.  Vitals reviewed.  LABORATORY DATA: Lab Results  Component Value Date   WBC 6.7 09/19/2018   HGB 13.6 09/19/2018   HCT 43.6 09/19/2018   MCV 89.3 09/19/2018   PLT 306 09/19/2018      Chemistry      Component Value Date/Time   NA 141 09/19/2018 1424  K 3.8 09/19/2018 1424   CL 105 09/19/2018 1424   CO2 27 09/19/2018 1424   BUN 9 09/19/2018 1424   CREATININE 0.79 09/19/2018 1424      Component Value Date/Time   CALCIUM 9.3 09/19/2018 1424   ALKPHOS 119 09/19/2018 1424   AST 19 09/19/2018 1424   ALT 6 09/19/2018 1424   BILITOT 0.3 09/19/2018 1424       RADIOGRAPHIC STUDIES:  Ct Chest W Contrast  Result Date: 09/02/2018 CLINICAL DATA:  Restaging lung cancer EXAM: CT CHEST WITH CONTRAST TECHNIQUE: Multidetector CT imaging of the chest was performed during intravenous contrast administration. CONTRAST:  24mL OMNIPAQUE IOHEXOL 300 MG/ML  SOLN COMPARISON:  06/11/2018, 03/19/2018, 01/25/2018 FINDINGS: Cardiovascular: Minimal aortic atherosclerosis. Scattered coronary artery calcifications. Normal heart size. No pericardial effusion.  Mediastinum/Nodes: No enlarged mediastinal, hilar, or axillary lymph nodes. Unchanged left hilar soft tissue thickening. Thyroid gland, trachea, and esophagus demonstrate no significant findings. Lungs/Pleura: Severe centrilobular emphysema. No significant change in dense, post treatment consolidation of the infrahilar left lower lobe. Unchanged small, likely loculated left pleural effusion. No pleural effusion or pneumothorax. Upper Abdomen: No acute abnormality. Musculoskeletal: No chest wall mass or suspicious bone lesions identified. Status post T9-T11 kyphoplasty. IMPRESSION: 1. No significant change in dense, post treatment consolidation of the infrahilar left lower lobe and soft tissue thickening of the left hilum without discrete lymphadenopathy. Unchanged small, likely loculated left pleural effusion. 2. Stable 7 mm pulmonary nodule of the right lower lobe (series 5, image 60). Attention on follow-up. 3.  Severe emphysema. 4.  Coronary artery disease and aortic atherosclerosis. Electronically Signed   By: Eddie Candle M.D.   On: 09/02/2018 15:59     ASSESSMENT/PLAN:  This is a very pleasant 73 year old Caucasian female with stage IIIa non-small cell lung cancer, poorly differentiated squamous cell carcinoma. She presented with a large superior segment of theleft lower lobe mass with questionable left hilar adenopathy. She was diagnosed in August 2019. The patient underwent a course of concurrent chemoradiation with carboplatin for an AUC of 2 and paclitaxel45 mg/m. Status post 7 cycles with a partial response.  The patient is currently undergoing consolidation immunotherapy with Imfinzi 10 mg/kg IV every 2 weeks. She is status post 19cycles of treatment. She continues to tolerate treatment well without any concerning adverse effects.   Labs were reviewed with the patient. I recommend that she proceed with cycle #20today as scheduled.   I will see her back for a follow up visit in 2  weeks for evaluation and to review her scan results prior to starting cycle #21.  The patient was encouraged to drink more water for her dry mouth.  The patient was advised to call immediately if she has any concerning symptoms in the interval. The patient voices understanding of current disease status and treatment options and is in agreement with the current care plan. All questions were answered. The patient knows to call the clinic with any problems, questions or concerns. We can certainly see the patient much sooner if necessary No orders of the defined types were placed in this encounter.    Cassandra L Heilingoetter, PA-C 09/19/18

## 2018-09-23 ENCOUNTER — Telehealth: Payer: Self-pay | Admitting: Physician Assistant

## 2018-09-23 NOTE — Telephone Encounter (Signed)
Scheduled appt per 9/10 los - pt to get an updated schedule next visit.

## 2018-10-03 ENCOUNTER — Other Ambulatory Visit: Payer: Self-pay

## 2018-10-03 ENCOUNTER — Other Ambulatory Visit: Payer: Self-pay | Admitting: Internal Medicine

## 2018-10-03 ENCOUNTER — Inpatient Hospital Stay: Payer: Medicare Other

## 2018-10-03 ENCOUNTER — Inpatient Hospital Stay (HOSPITAL_BASED_OUTPATIENT_CLINIC_OR_DEPARTMENT_OTHER): Payer: Medicare Other | Admitting: Internal Medicine

## 2018-10-03 ENCOUNTER — Encounter: Payer: Self-pay | Admitting: Internal Medicine

## 2018-10-03 VITALS — BP 113/66 | HR 97 | Temp 98.2°F | Resp 18 | Ht 61.0 in | Wt 75.0 lb

## 2018-10-03 DIAGNOSIS — C3492 Malignant neoplasm of unspecified part of left bronchus or lung: Secondary | ICD-10-CM

## 2018-10-03 DIAGNOSIS — C3432 Malignant neoplasm of lower lobe, left bronchus or lung: Secondary | ICD-10-CM | POA: Diagnosis not present

## 2018-10-03 DIAGNOSIS — Z5112 Encounter for antineoplastic immunotherapy: Secondary | ICD-10-CM

## 2018-10-03 LAB — CBC WITH DIFFERENTIAL (CANCER CENTER ONLY)
Abs Immature Granulocytes: 0.01 10*3/uL (ref 0.00–0.07)
Basophils Absolute: 0 10*3/uL (ref 0.0–0.1)
Basophils Relative: 1 %
Eosinophils Absolute: 0.1 10*3/uL (ref 0.0–0.5)
Eosinophils Relative: 1 %
HCT: 40.6 % (ref 36.0–46.0)
Hemoglobin: 12.5 g/dL (ref 12.0–15.0)
Immature Granulocytes: 0 %
Lymphocytes Relative: 15 %
Lymphs Abs: 0.8 10*3/uL (ref 0.7–4.0)
MCH: 27.7 pg (ref 26.0–34.0)
MCHC: 30.8 g/dL (ref 30.0–36.0)
MCV: 90 fL (ref 80.0–100.0)
Monocytes Absolute: 0.4 10*3/uL (ref 0.1–1.0)
Monocytes Relative: 8 %
Neutro Abs: 3.9 10*3/uL (ref 1.7–7.7)
Neutrophils Relative %: 75 %
Platelet Count: 295 10*3/uL (ref 150–400)
RBC: 4.51 MIL/uL (ref 3.87–5.11)
RDW: 14.6 % (ref 11.5–15.5)
WBC Count: 5.2 10*3/uL (ref 4.0–10.5)
nRBC: 0 % (ref 0.0–0.2)

## 2018-10-03 LAB — CMP (CANCER CENTER ONLY)
ALT: 6 U/L (ref 0–44)
AST: 17 U/L (ref 15–41)
Albumin: 3.9 g/dL (ref 3.5–5.0)
Alkaline Phosphatase: 111 U/L (ref 38–126)
Anion gap: 8 (ref 5–15)
BUN: 12 mg/dL (ref 8–23)
CO2: 29 mmol/L (ref 22–32)
Calcium: 9 mg/dL (ref 8.9–10.3)
Chloride: 106 mmol/L (ref 98–111)
Creatinine: 0.74 mg/dL (ref 0.44–1.00)
GFR, Est AFR Am: >60 mL/min (ref >60)
GFR, Estimated: >60 mL/min (ref >60)
Glucose, Bld: 90 mg/dL (ref 70–99)
Potassium: 3.4 mmol/L — ABNORMAL LOW (ref 3.5–5.1)
Sodium: 143 mmol/L (ref 135–145)
Total Bilirubin: 0.4 mg/dL (ref 0.3–1.2)
Total Protein: 7.2 g/dL (ref 6.5–8.1)

## 2018-10-03 LAB — TSH: TSH: 1.734 u[IU]/mL (ref 0.308–3.960)

## 2018-10-03 MED ORDER — SODIUM CHLORIDE 0.9 % IV SOLN
10.5000 mg/kg | Freq: Once | INTRAVENOUS | Status: AC
  Administered 2018-10-03: 360 mg via INTRAVENOUS
  Filled 2018-10-03: qty 7.2

## 2018-10-03 MED ORDER — ALBUTEROL SULFATE HFA 108 (90 BASE) MCG/ACT IN AERS: 2.0000 | INHALATION_SPRAY | Freq: Four times a day (QID) | RESPIRATORY_TRACT | 1 refills | Status: DC | PRN

## 2018-10-03 MED ORDER — SODIUM CHLORIDE 0.9 % IV SOLN
Freq: Once | INTRAVENOUS | Status: AC
  Administered 2018-10-03: 14:00:00 via INTRAVENOUS
  Filled 2018-10-03: qty 250

## 2018-10-03 NOTE — Patient Instructions (Signed)
[facility] Discharge Instructions for Patients Receiving Chemotherapy  Today you received the following chemotherapy agents: Durvalumab (Imfinzi)   To help prevent nausea and vomiting after your treatment, we encourage you to take your nausea medication as directed.   If you develop nausea and vomiting that is not controlled by your nausea medication, call the clinic.   BELOW ARE SYMPTOMS THAT SHOULD BE REPORTED IMMEDIATELY:  *FEVER GREATER THAN 100.5 F  *CHILLS WITH OR WITHOUT FEVER  NAUSEA AND VOMITING THAT IS NOT CONTROLLED WITH YOUR NAUSEA MEDICATION  *UNUSUAL SHORTNESS OF BREATH  *UNUSUAL BRUISING OR BLEEDING  TENDERNESS IN MOUTH AND THROAT WITH OR WITHOUT PRESENCE OF ULCERS  *URINARY PROBLEMS  *BOWEL PROBLEMS  UNUSUAL RASH Items with * indicate a potential emergency and should be followed up as soon as possible.  Feel free to call the clinic should you have any questions or concerns. The clinic phone number is [phone].  Please show the CHEMO ALERT CARD at check-in to the Emergency Department and triage nurse.   

## 2018-10-03 NOTE — Progress Notes (Signed)
[facility] [phone]   [phone]  OFFICE PROGRESS NOTE  [name], NP [place] [address]  DIAGNOSIS: Stage IIb/IIIa (T3, N0/N1, M0) non-small cell lung cancer poorly differentiated squamous cell carcinoma diagnosed in August 2019.  The patient presented with large superior segment left lower lobe mass with questionable left hilar adenopathy.  PRIOR THERAPY: Concurrent chemoradiation with weekly carboplatin for AUC of 2 and paclitaxel 45 mg/M2.  Status post 7 cycles.  Last dose was given October 29, 2017 with partial response.  CURRENT THERAPY: Consolidation treatment with immunotherapy with Imfinzi 10 mg/KG every 2 weeks to start December 13, 2017.  Status post 20 cycles.  INTERVAL HISTORY: Sherry [name] 73 y.o. female returns to the clinic today for follow-up visit.  The patient is feeling fine today with no concerning complaints.  She continues to have intermittent swelling of the submandibular salivary glands.  She is using salt water mouthwash.  She denied having any current chest pain, shortness of breath, cough or hemoptysis.  She denied having any fever or chills.  She has no nausea, vomiting, diarrhea or constipation.  She has no headache or visual changes.  She is here today for evaluation before starting cycle #21.   MEDICAL HISTORY: Past Medical History:  Diagnosis Date  . Allergy   . Anxiety   . Arthritis   . Cavitating mass in left lower lung lobe   . Complication of anesthesia    woke up during procedure  . COPD (chronic obstructive pulmonary disease) ([place])   . Depression   . Family history of adverse reaction to anesthesia    " my mother was always hard to wake up"  . Full dentures   . Headache   . Neuromuscular disorder ([facility])   . Pneumonia   . Thoracic spine fracture ([place]) 11/01/2017   T9  . Wears glasses     ALLERGIES:  is allergic to omnipaque [iohexol] and penicillins.   MEDICATIONS:  Current Outpatient Medications  Medication Sig Dispense Refill  . acetaminophen (TYLENOL 8 HOUR) 650 MG CR tablet Take 1 tablet (650 mg total) by mouth every 8 (eight) hours as needed for pain or fever. 30 tablet 0  . alendronate (FOSAMAX) 70 MG tablet Take 70 mg by mouth once a week.    . gabapentin (NEURONTIN) 100 MG capsule Take 1 capsule (100 mg total) by mouth 3 (three) times daily. Take 1 tablet once a day 90 capsule 2  . guaifenesin (ROBITUSSIN) 100 MG/5ML syrup Take 200 mg by mouth 3 (three) times daily as needed for cough.     Marland Kitchen HYDROcodone-acetaminophen (NORCO/VICODIN) 5-325 MG tablet TK 1-2 TS PO Q 6 H PRF MODERATE PAIN    . Menthol-Ascorbic Acid (LUDENS COUGH DROPS MT) Use as directed 1 lozenge in the mouth or throat 3 (three) times daily as needed (cough).     . Multiple Vitamin (MULTIVITAMIN WITH MINERALS) TABS tablet Take 1 tablet by mouth daily.    . predniSONE (DELTASONE) 50 MG tablet Take 1 (50 mg) tablet 13 hours, 7 hours, and 2 hours before your CT scan 3 tablet 0  . prochlorperazine (COMPAZINE) 10 MG tablet Take 1 tablet (10 mg total) by mouth every 6 (six) hours as needed for nausea or vomiting. (Patient not taking: Reported on 08/22/2018) 30 tablet 0   No current facility-administered medications for this visit.     SURGICAL HISTORY:  Past Surgical  History:  Procedure Laterality Date  . ELBOW SURGERY     for  "Tennis Elbow"  . hand surgery    . IR KYPHO THORACIC WITH BONE BIOPSY  11/27/2017  . IR KYPHO THORACIC WITH BONE BIOPSY  03/01/2018  . IR KYPHO THORACIC WITH BONE BIOPSY  03/26/2018  . IR RADIOLOGIST EVAL & MGMT  11/20/2017  . IR RADIOLOGIST EVAL & MGMT  02/26/2018  . IR RADIOLOGIST EVAL & MGMT  03/13/2018  . MULTIPLE TOOTH EXTRACTIONS    . TUBAL LIGATION    . VIDEO BRONCHOSCOPY N/A 08/31/2017   Procedure: VIDEO BRONCHOSCOPY;  Surgeon: Melrose Nakayama, MD;  Location: [facility] OR;  Service: Thoracic;  Laterality: N/A;    REVIEW OF SYSTEMS:  A  comprehensive review of systems was negative.   PHYSICAL EXAMINATION: General appearance: alert, cooperative and no distress Head: Normocephalic, without obvious abnormality, atraumatic Neck: no adenopathy, no JVD, supple, symmetrical, trachea midline and thyroid not enlarged, symmetric, no tenderness/mass/nodules Lymph nodes: Cervical, supraclavicular, and axillary nodes normal. Resp: clear to auscultation bilaterally Back: symmetric, no curvature. ROM normal. No CVA tenderness. Cardio: regular rate and rhythm, S1, S2 normal, no murmur, click, rub or gallop GI: soft, non-tender; bowel sounds normal; no masses,  no organomegaly Extremities: extremities normal, atraumatic, no cyanosis or edema  ECOG PERFORMANCE STATUS: 1 - Symptomatic but completely ambulatory  Blood pressure 113/66, pulse 97, temperature 98.2 F (36.8 C), temperature source Temporal, resp. rate 18, height 5\' 1"  (1.549 m), weight 75 lb (34 kg), SpO2 96 %.  LABORATORY DATA: Lab Results  Component Value Date   WBC 6.7 09/19/2018   HGB 13.6 09/19/2018   HCT 43.6 09/19/2018   MCV 89.3 09/19/2018   PLT 306 09/19/2018      Chemistry      Component Value Date/Time   NA 141 09/19/2018 1424   K 3.8 09/19/2018 1424   CL 105 09/19/2018 1424   CO2 27 09/19/2018 1424   BUN 9 09/19/2018 1424   CREATININE 0.79 09/19/2018 1424      Component Value Date/Time   CALCIUM 9.3 09/19/2018 1424   ALKPHOS 119 09/19/2018 1424   AST 19 09/19/2018 1424   ALT 6 09/19/2018 1424   BILITOT 0.3 09/19/2018 1424       RADIOGRAPHIC STUDIES: No results found.  ASSESSMENT AND PLAN: This is a very pleasant 73 years old white female with a stage IIIa non-small cell lung cancer, poorly differentiated squamous cell carcinoma.   The patient underwent a course of concurrent chemoradiation with weekly carboplatin and paclitaxel status post 7 cycles with partial response.  The patient is currently undergoing consolidation treatment with  immunotherapy with Imfinzi status post 20 cycles.  The patient continues to tolerate her treatment well with no concerning adverse effects. I recommended for her to proceed with cycle #21 today as planned. I will see her back for follow-up visit in 2 weeks for evaluation before the next cycle of her treatment. She was advised to call immediately if she has any concerning symptoms in the interval. The patient voices understanding of current disease status and treatment options and is in agreement with the current care plan. All questions were answered. The patient knows to call the clinic with any problems, questions or concerns. We can certainly see the patient much sooner if necessary.  Disclaimer: This note was dictated with voice recognition software. Similar sounding words can inadvertently be transcribed and may not be corrected upon review.

## 2018-10-16 ENCOUNTER — Other Ambulatory Visit: Payer: Self-pay

## 2018-10-16 ENCOUNTER — Encounter: Payer: Self-pay | Admitting: Physician Assistant

## 2018-10-16 ENCOUNTER — Inpatient Hospital Stay: Payer: Medicare Other | Attending: Internal Medicine

## 2018-10-16 ENCOUNTER — Inpatient Hospital Stay: Payer: Medicare Other | Admitting: Physician Assistant

## 2018-10-16 ENCOUNTER — Inpatient Hospital Stay: Payer: Medicare Other

## 2018-10-16 ENCOUNTER — Telehealth: Payer: Self-pay | Admitting: Internal Medicine

## 2018-10-16 VITALS — BP 124/75 | HR 93 | Temp 98.7°F | Resp 18 | Ht 61.0 in | Wt 74.9 lb

## 2018-10-16 DIAGNOSIS — J449 Chronic obstructive pulmonary disease, unspecified: Secondary | ICD-10-CM | POA: Diagnosis not present

## 2018-10-16 DIAGNOSIS — Z5112 Encounter for antineoplastic immunotherapy: Secondary | ICD-10-CM | POA: Insufficient documentation

## 2018-10-16 DIAGNOSIS — R682 Dry mouth, unspecified: Secondary | ICD-10-CM | POA: Diagnosis not present

## 2018-10-16 DIAGNOSIS — Z79899 Other long term (current) drug therapy: Secondary | ICD-10-CM | POA: Insufficient documentation

## 2018-10-16 DIAGNOSIS — C3492 Malignant neoplasm of unspecified part of left bronchus or lung: Secondary | ICD-10-CM

## 2018-10-16 DIAGNOSIS — C3432 Malignant neoplasm of lower lobe, left bronchus or lung: Secondary | ICD-10-CM | POA: Insufficient documentation

## 2018-10-16 LAB — CBC WITH DIFFERENTIAL (CANCER CENTER ONLY)
Abs Immature Granulocytes: 0.01 10*3/uL (ref 0.00–0.07)
Basophils Absolute: 0 10*3/uL (ref 0.0–0.1)
Basophils Relative: 1 %
Eosinophils Absolute: 0.1 10*3/uL (ref 0.0–0.5)
Eosinophils Relative: 1 %
HCT: 41.6 % (ref 36.0–46.0)
Hemoglobin: 13.1 g/dL (ref 12.0–15.0)
Immature Granulocytes: 0 %
Lymphocytes Relative: 18 %
Lymphs Abs: 0.9 10*3/uL (ref 0.7–4.0)
MCH: 27.5 pg (ref 26.0–34.0)
MCHC: 31.5 g/dL (ref 30.0–36.0)
MCV: 87.2 fL (ref 80.0–100.0)
Monocytes Absolute: 0.3 10*3/uL (ref 0.1–1.0)
Monocytes Relative: 7 %
Neutro Abs: 3.4 10*3/uL (ref 1.7–7.7)
Neutrophils Relative %: 73 %
Platelet Count: 273 10*3/uL (ref 150–400)
RBC: 4.77 MIL/uL (ref 3.87–5.11)
RDW: 14.7 % (ref 11.5–15.5)
WBC Count: 4.7 10*3/uL (ref 4.0–10.5)
nRBC: 0 % (ref 0.0–0.2)

## 2018-10-16 LAB — CMP (CANCER CENTER ONLY)
ALT: 6 U/L (ref 0–44)
AST: 16 U/L (ref 15–41)
Albumin: 3.8 g/dL (ref 3.5–5.0)
Alkaline Phosphatase: 109 U/L (ref 38–126)
Anion gap: 11 (ref 5–15)
BUN: 10 mg/dL (ref 8–23)
CO2: 26 mmol/L (ref 22–32)
Calcium: 9 mg/dL (ref 8.9–10.3)
Chloride: 106 mmol/L (ref 98–111)
Creatinine: 0.73 mg/dL (ref 0.44–1.00)
GFR, Est AFR Am: >60 mL/min (ref >60)
GFR, Estimated: >60 mL/min (ref >60)
Glucose, Bld: 98 mg/dL (ref 70–99)
Potassium: 3.5 mmol/L (ref 3.5–5.1)
Sodium: 143 mmol/L (ref 135–145)
Total Bilirubin: 0.4 mg/dL (ref 0.3–1.2)
Total Protein: 7.2 g/dL (ref 6.5–8.1)

## 2018-10-16 MED ORDER — SODIUM CHLORIDE 0.9 % IV SOLN
Freq: Once | INTRAVENOUS | Status: AC
  Administered 2018-10-16: 15:00:00 via INTRAVENOUS
  Filled 2018-10-16: qty 250

## 2018-10-16 MED ORDER — SODIUM CHLORIDE 0.9 % IV SOLN
10.5000 mg/kg | Freq: Once | INTRAVENOUS | Status: AC
  Administered 2018-10-16: 16:00:00 360 mg via INTRAVENOUS
  Filled 2018-10-16: qty 7.2

## 2018-10-16 NOTE — Patient Instructions (Signed)
[facility] Discharge Instructions for Patients Receiving Chemotherapy  Today you received the following chemotherapy agents: Durvalumab (Imfinzi)   To help prevent nausea and vomiting after your treatment, we encourage you to take your nausea medication as directed.   If you develop nausea and vomiting that is not controlled by your nausea medication, call the clinic.   BELOW ARE SYMPTOMS THAT SHOULD BE REPORTED IMMEDIATELY:  *FEVER GREATER THAN 100.5 F  *CHILLS WITH OR WITHOUT FEVER  NAUSEA AND VOMITING THAT IS NOT CONTROLLED WITH YOUR NAUSEA MEDICATION  *UNUSUAL SHORTNESS OF BREATH  *UNUSUAL BRUISING OR BLEEDING  TENDERNESS IN MOUTH AND THROAT WITH OR WITHOUT PRESENCE OF ULCERS  *URINARY PROBLEMS  *BOWEL PROBLEMS  UNUSUAL RASH Items with * indicate a potential emergency and should be followed up as soon as possible.  Feel free to call the clinic should you have any questions or concerns. The clinic phone number is [phone].  Please show the CHEMO ALERT CARD at check-in to the Emergency Department and triage nurse.   

## 2018-10-16 NOTE — Telephone Encounter (Signed)
Scheduled appt per 10/7 los - pt to get an updated schedule next visit.   

## 2018-10-16 NOTE — Progress Notes (Signed)
[facility] OFFICE PROGRESS NOTE  [name], NP [place] [address]  DIAGNOSIS: Stage IIb/IIIa (T3, N0/N1, M0)non-small cell lung cancer poorly differentiated squamous cell carcinoma diagnosed in August 2019. The patient presented with large superior segment leftlower lobe mass with questionable left hilar adenopathy.  PRIOR THERAPY: Concurrent chemoradiation with weekly carboplatin for AUC of 2 and paclitaxel 45 mg/M2. Status post 7 cycles. Last dose was given October 29, 2017 with partial response  CURRENT THERAPY: Consolidation treatment with immunotherapy with Imfinzi 10 mg/KG every 2 weeks to start December 13, 2017. Status post21cycles  INTERVAL HISTORY: Sherry [name] 73 y.o. female returns to the clinic for a follow up visit. The patient is feeling well today without any concerning complaints except for a dry mouth when she wakes up in the morning. The patient continues to tolerate immunotherapy with Imfinzi well without any adverse side effects. Denies any fever, chills, night sweats, or weight loss. Denies any chest pain, cough, or hemoptysis. She reports her baseline shortness of breath with exertion. Denies any nausea, vomiting, diarrhea, or constipation. Denies any headache or visual changes. Denies any rashes or skin changes. The patient is here today for evaluation prior to starting cycle # 22  MEDICAL HISTORY: Past Medical History:  Diagnosis Date  . Allergy   . Anxiety   . Arthritis   . Cavitating mass in left lower lung lobe   . Complication of anesthesia    woke up during procedure  . COPD (chronic obstructive pulmonary disease) ([place])   . Depression   . Family history of adverse reaction to anesthesia    " my mother was always hard to wake up"  . Full dentures   . Headache   . Neuromuscular disorder ([place])   . Pneumonia   . Thoracic spine fracture ([place]) 11/01/2017   T9  . Wears glasses     ALLERGIES:  is  allergic to omnipaque [iohexol] and penicillins.  MEDICATIONS:  Current Outpatient Medications  Medication Sig Dispense Refill  . acetaminophen (TYLENOL 8 HOUR) 650 MG CR tablet Take 1 tablet (650 mg total) by mouth every 8 (eight) hours as needed for pain or fever. 30 tablet 0  . albuterol (VENTOLIN HFA) 108 (90 Base) MCG/ACT inhaler INHALE 2 PUFFS INTO THE LUNGS EVERY 6 HOURS AS NEEDED FOR WHEEZING OR SHORTNESS OF BREATH 25.5 g 1  . alendronate (FOSAMAX) 70 MG tablet Take 70 mg by mouth once a week.    . gabapentin (NEURONTIN) 100 MG capsule Take 1 capsule (100 mg total) by mouth 3 (three) times daily. Take 1 tablet once a day 90 capsule 2  . guaifenesin (ROBITUSSIN) 100 MG/5ML syrup Take 200 mg by mouth 3 (three) times daily as needed for cough.     Marland Kitchen HYDROcodone-acetaminophen (NORCO/VICODIN) 5-325 MG tablet TK 1-2 TS PO Q 6 H PRF MODERATE PAIN    . Menthol-Ascorbic Acid (LUDENS COUGH DROPS MT) Use as directed 1 lozenge in the mouth or throat 3 (three) times daily as needed (cough).     . Multiple Vitamin (MULTIVITAMIN WITH MINERALS) TABS tablet Take 1 tablet by mouth daily.    . predniSONE (DELTASONE) 50 MG tablet Take 1 (50 mg) tablet 13 hours, 7 hours, and 2 hours before your CT scan 3 tablet 0  . prochlorperazine (COMPAZINE) 10 MG tablet Take 1 tablet (10 mg total) by mouth every 6 (six) hours as needed for nausea or vomiting. (Patient not taking:  Reported on 08/22/2018) 30 tablet 0   No current facility-administered medications for this visit.     SURGICAL HISTORY:  Past Surgical History:  Procedure Laterality Date  . ELBOW SURGERY     for  "Tennis Elbow"  . hand surgery    . IR KYPHO THORACIC WITH BONE BIOPSY  11/27/2017  . IR KYPHO THORACIC WITH BONE BIOPSY  03/01/2018  . IR KYPHO THORACIC WITH BONE BIOPSY  03/26/2018  . IR RADIOLOGIST EVAL & MGMT  11/20/2017  . IR RADIOLOGIST EVAL & MGMT  02/26/2018  . IR RADIOLOGIST EVAL & MGMT  03/13/2018  . MULTIPLE TOOTH EXTRACTIONS    .  TUBAL LIGATION    . VIDEO BRONCHOSCOPY N/A 08/31/2017   Procedure: VIDEO BRONCHOSCOPY;  Surgeon: Melrose Nakayama, MD;  Location: [facility] OR;  Service: Thoracic;  Laterality: N/A;    REVIEW OF SYSTEMS:   Review of Systems  Constitutional: Positive for fatigue. Negative for appetite change, chills,  fever and unexpected weight change.  HENT:Positive for dry mouth. Negative for mouth sores, nosebleeds, sore throat and trouble swallowing.  Eyes: Negative for eye problems and icterus.  Respiratory: Positive for wheezing. Negative for cough, hemoptysis, and shortness of breath Cardiovascular: Negative for chest pain and leg swelling.  Gastrointestinal: Negative for abdominal pain, constipation, diarrhea, nausea and vomiting.  Genitourinary: Negative for bladder incontinence, difficulty urinating, dysuria, frequency and hematuria.  Musculoskeletal:Negative for back pain, gait problem, neck pain and neck stiffness.  Skin: Negative for itching and rash.  Neurological: Negative for dizziness, extremity weakness, gait problem, headaches, light-headedness and seizures.  Hematological: Negative for adenopathy. Does not bruise/bleed easily.  Psychiatric/Behavioral: Negative for confusion, depression and sleep disturbance. The patient is not nervous/anxious.   PHYSICAL EXAMINATION:  Blood pressure 124/75, pulse 93, temperature 98.7 F (37.1 C), temperature source Temporal, resp. rate 18, height 5\' 1"  (1.549 m), weight 74 lb 14.4 oz (34 kg), SpO2 97 %.  ECOG PERFORMANCE STATUS: 1 - Symptomatic but completely ambulatory  Physical Exam  Constitutional: Oriented to person, place, and time andthin appearing femaleand in no distress.  HENT:  Head: Normocephalic and atraumatic.  Mouth/Throat: Oropharynx is clear and moist. No oropharyngeal exudate.  Eyes: Conjunctivae are normal. Right eye exhibits no discharge. Left eye exhibits no discharge. No scleral icterus.  Neck: Normal range of motion. Neck  supple.  Cardiovascular: Normal rate, regular rhythm, normal heart sounds and intact distal pulses.  Pulmonary/Chest: Effort normal.Scattered wheezing.No respiratory distress. No rales.  Abdominal: Soft. Bowel sounds are normal. Exhibits no distension and no mass. There is no tenderness.  Musculoskeletal: Normal range of motion. Exhibits no edema.  Lymphadenopathy:  No cervical adenopathy.  Neurological: Alert and oriented to person, place, and time. Exhibits normal muscle tone. Gait normal. Coordination normal.  Skin: Skin is warm and dry. No rash noted. Not diaphoretic. No erythema. No pallor.  Psychiatric: Mood, memory and judgment normal.  Vitals reviewed.  LABORATORY DATA: Lab Results  Component Value Date   WBC 4.7 10/16/2018   HGB 13.1 10/16/2018   HCT 41.6 10/16/2018   MCV 87.2 10/16/2018   PLT 273 10/16/2018      Chemistry      Component Value Date/Time   NA 143 10/16/2018 1340   K 3.5 10/16/2018 1340   CL 106 10/16/2018 1340   CO2 26 10/16/2018 1340   BUN 10 10/16/2018 1340   CREATININE 0.73 10/16/2018 1340      Component Value Date/Time   CALCIUM 9.0 10/16/2018 1340   ALKPHOS  109 10/16/2018 1340   AST 16 10/16/2018 1340   ALT 6 10/16/2018 1340   BILITOT 0.4 10/16/2018 1340       RADIOGRAPHIC STUDIES:  No results found.   ASSESSMENT/PLAN:  This is a very pleasant 73 year old Caucasian female with stage IIIa non-small cell lung cancer, poorly differentiated squamous cell carcinoma. She presented with a large superior segment of theleft lower lobe mass with questionable left hilar adenopathy. She was diagnosed in August 2019.   The patient underwent a course of concurrent chemoradiation with carboplatin for an AUC of 2 and paclitaxel45 mg/m. Status post 7 cycles with a partial response.  The patient is currently undergoing consolidation immunotherapy with Imfinzi 10 mg/kg IV every 2 weeks. She is status post 21cycles of treatment. She  continues to tolerate treatment well without any concerning adverse side effects.  Labs were reviewed with the patient. I recommend that she proceed with cycle #22today as scheduled.  We will see her back for a follow up visit in 2 weeks for evaluation before starting cycle #23.  The patient was advised to call immediately if she has any concerning symptoms in the interval. The patient voices understanding of current disease status and treatment options and is in agreement with the current care plan. All questions were answered. The patient knows to call the clinic with any problems, questions or concerns. We can certainly see the patient much sooner if necessary  No orders of the defined types were placed in this encounter.    Cassandra L Heilingoetter, PA-C 10/16/18

## 2018-10-31 ENCOUNTER — Inpatient Hospital Stay (HOSPITAL_BASED_OUTPATIENT_CLINIC_OR_DEPARTMENT_OTHER): Payer: Medicare Other | Admitting: Physician Assistant

## 2018-10-31 ENCOUNTER — Inpatient Hospital Stay: Payer: Medicare Other

## 2018-10-31 ENCOUNTER — Telehealth: Payer: Self-pay | Admitting: Physician Assistant

## 2018-10-31 ENCOUNTER — Other Ambulatory Visit: Payer: Self-pay

## 2018-10-31 VITALS — BP 123/63 | HR 82 | Temp 98.0°F | Resp 18 | Ht 61.0 in | Wt 72.8 lb

## 2018-10-31 DIAGNOSIS — Z5112 Encounter for antineoplastic immunotherapy: Secondary | ICD-10-CM | POA: Diagnosis not present

## 2018-10-31 DIAGNOSIS — C3492 Malignant neoplasm of unspecified part of left bronchus or lung: Secondary | ICD-10-CM

## 2018-10-31 LAB — TSH: TSH: 1.969 u[IU]/mL (ref 0.308–3.960)

## 2018-10-31 LAB — CBC WITH DIFFERENTIAL (CANCER CENTER ONLY)
Abs Immature Granulocytes: 0.01 10*3/uL (ref 0.00–0.07)
Basophils Absolute: 0.1 10*3/uL (ref 0.0–0.1)
Basophils Relative: 1 %
Eosinophils Absolute: 0.1 10*3/uL (ref 0.0–0.5)
Eosinophils Relative: 1 %
HCT: 41.7 % (ref 36.0–46.0)
Hemoglobin: 12.9 g/dL (ref 12.0–15.0)
Immature Granulocytes: 0 %
Lymphocytes Relative: 15 %
Lymphs Abs: 0.9 10*3/uL (ref 0.7–4.0)
MCH: 27.4 pg (ref 26.0–34.0)
MCHC: 30.9 g/dL (ref 30.0–36.0)
MCV: 88.5 fL (ref 80.0–100.0)
Monocytes Absolute: 0.5 10*3/uL (ref 0.1–1.0)
Monocytes Relative: 9 %
Neutro Abs: 4.5 10*3/uL (ref 1.7–7.7)
Neutrophils Relative %: 74 %
Platelet Count: 284 10*3/uL (ref 150–400)
RBC: 4.71 MIL/uL (ref 3.87–5.11)
RDW: 15.1 % (ref 11.5–15.5)
WBC Count: 6 10*3/uL (ref 4.0–10.5)
nRBC: 0 % (ref 0.0–0.2)

## 2018-10-31 LAB — CMP (CANCER CENTER ONLY)
ALT: 8 U/L (ref 0–44)
AST: 17 U/L (ref 15–41)
Albumin: 3.7 g/dL (ref 3.5–5.0)
Alkaline Phosphatase: 115 U/L (ref 38–126)
Anion gap: 13 (ref 5–15)
BUN: 13 mg/dL (ref 8–23)
CO2: 25 mmol/L (ref 22–32)
Calcium: 9.2 mg/dL (ref 8.9–10.3)
Chloride: 105 mmol/L (ref 98–111)
Creatinine: 0.71 mg/dL (ref 0.44–1.00)
GFR, Est AFR Am: >60 mL/min (ref >60)
GFR, Estimated: >60 mL/min (ref >60)
Glucose, Bld: 87 mg/dL (ref 70–99)
Potassium: 3.4 mmol/L — ABNORMAL LOW (ref 3.5–5.1)
Sodium: 143 mmol/L (ref 135–145)
Total Bilirubin: 0.5 mg/dL (ref 0.3–1.2)
Total Protein: 7.3 g/dL (ref 6.5–8.1)

## 2018-10-31 MED ORDER — SODIUM CHLORIDE 0.9 % IV SOLN
Freq: Once | INTRAVENOUS | Status: AC
  Administered 2018-10-31: 13:00:00 via INTRAVENOUS
  Filled 2018-10-31: qty 250

## 2018-10-31 MED ORDER — SODIUM CHLORIDE 0.9 % IV SOLN
10.5000 mg/kg | Freq: Once | INTRAVENOUS | Status: AC
  Administered 2018-10-31: 360 mg via INTRAVENOUS
  Filled 2018-10-31: qty 7.2

## 2018-10-31 NOTE — Progress Notes (Signed)
[facility] OFFICE PROGRESS NOTE  [name], NP [place] [address]  DIAGNOSIS: Stage IIb/IIIa (T3, N0/N1, M0)non-small cell lung cancer poorly differentiated squamous cell carcinoma diagnosed in August 2019. The patient presented with large superior segment leftlower lobe mass with questionable left hilar adenopathy.  PRIOR THERAPY: Concurrent chemoradiation with weekly carboplatin for AUC of 2 and paclitaxel 45 mg/M2. Status post 7 cycles. Last dose was given October 29, 2017 with partial response  CURRENT THERAPY: Consolidation treatment with immunotherapy with Imfinzi 10 mg/KG every 2 weeks to start December 13, 2017. Status post22cycles  INTERVAL HISTORY: Sherry [name] 73 y.o. female returns The patient is feeling well today without any concerning complaints except for a dry mouth when she wakes up in the morning. She has been rinsing with warm salt water. She also has some seasonal allergies for which she is taking Claritin. She lost two pounds recently because she says that she has been sleeping a lot due to her allergies and not eating as much.  Otherwise, she continues to tolerate immunotherapy well without any adverse side effects. She denies any Denies any fever, chills, or night sweats. Denies any chest pain, cough, or hemoptysis. She reports her baseline shortness of breath with exertion. Denies any nausea, vomiting, diarrhea, or constipation. Denies any headache or visual changes. Denies any rashes or skin changes. The patient is here today for evaluation prior to starting cycle # 23  MEDICAL HISTORY: Past Medical History:  Diagnosis Date  . Allergy   . Anxiety   . Arthritis   . Cavitating mass in left lower lung lobe   . Complication of anesthesia    woke up during procedure  . COPD (chronic obstructive pulmonary disease) ([place])   . Depression   . Family history of adverse reaction to anesthesia    " my mother was  always hard to wake up"  . Full dentures   . Headache   . Neuromuscular disorder ([place])   . Pneumonia   . Thoracic spine fracture ([place]) 11/01/2017   T9  . Wears glasses     ALLERGIES:  is allergic to omnipaque [iohexol] and penicillins.  MEDICATIONS:  Current Outpatient Medications  Medication Sig Dispense Refill  . acetaminophen (TYLENOL 8 HOUR) 650 MG CR tablet Take 1 tablet (650 mg total) by mouth every 8 (eight) hours as needed for pain or fever. 30 tablet 0  . albuterol (VENTOLIN HFA) 108 (90 Base) MCG/ACT inhaler INHALE 2 PUFFS INTO THE LUNGS EVERY 6 HOURS AS NEEDED FOR WHEEZING OR SHORTNESS OF BREATH 25.5 g 1  . alendronate (FOSAMAX) 70 MG tablet Take 70 mg by mouth once a week.    . gabapentin (NEURONTIN) 100 MG capsule Take 1 capsule (100 mg total) by mouth 3 (three) times daily. Take 1 tablet once a day 90 capsule 2  . guaifenesin (ROBITUSSIN) 100 MG/5ML syrup Take 200 mg by mouth 3 (three) times daily as needed for cough.     Marland Kitchen HYDROcodone-acetaminophen (NORCO/VICODIN) 5-325 MG tablet TK 1-2 TS PO Q 6 H PRF MODERATE PAIN    . Menthol-Ascorbic Acid (LUDENS COUGH DROPS MT) Use as directed 1 lozenge in the mouth or throat 3 (three) times daily as needed (cough).     . Multiple Vitamin (MULTIVITAMIN WITH MINERALS) TABS tablet Take 1 tablet by mouth daily.    . predniSONE (DELTASONE) 50 MG tablet Take 1 (50 mg) tablet 13 hours, 7 hours, and 2  hours before your CT scan 3 tablet 0  . prochlorperazine (COMPAZINE) 10 MG tablet Take 1 tablet (10 mg total) by mouth every 6 (six) hours as needed for nausea or vomiting. (Patient not taking: Reported on 08/22/2018) 30 tablet 0   No current facility-administered medications for this visit.    Facility-Administered Medications Ordered in Other Visits  Medication Dose Route Frequency Provider Last Rate Last Dose  . 0.9 %  sodium chloride infusion   Intravenous Once Curt Bears, MD      . durvalumab (IMFINZI) 340 mg in sodium chloride 0.9  % 100 mL chemo infusion  10 mg/kg (Treatment Plan Recorded) Intravenous Once Curt Bears, MD        SURGICAL HISTORY:  Past Surgical History:  Procedure Laterality Date  . ELBOW SURGERY     for  "Tennis Elbow"  . hand surgery    . IR KYPHO THORACIC WITH BONE BIOPSY  11/27/2017  . IR KYPHO THORACIC WITH BONE BIOPSY  03/01/2018  . IR KYPHO THORACIC WITH BONE BIOPSY  03/26/2018  . IR RADIOLOGIST EVAL & MGMT  11/20/2017  . IR RADIOLOGIST EVAL & MGMT  02/26/2018  . IR RADIOLOGIST EVAL & MGMT  03/13/2018  . MULTIPLE TOOTH EXTRACTIONS    . TUBAL LIGATION    . VIDEO BRONCHOSCOPY N/A 08/31/2017   Procedure: VIDEO BRONCHOSCOPY;  Surgeon: Melrose Nakayama, MD;  Location: [facility] OR;  Service: Thoracic;  Laterality: N/A;    REVIEW OF SYSTEMS:   Review of Systems  Constitutional:Positive for fatigue and 2 lb weight loss.Negative for appetite change, chills, and fever. HENT:Positive for dry mouth.Negative for mouth sores, nosebleeds, sore throat and trouble swallowing.  Eyes: Negative for eye problems and icterus.  Respiratory:Positive for wheezing.Negative for cough, hemoptysis,andshortness of breath Cardiovascular: Negative for chest pain and leg swelling.  Gastrointestinal: Negative for abdominal pain, constipation, diarrhea, nausea and vomiting.  Genitourinary: Negative for bladder incontinence, difficulty urinating, dysuria, frequency and hematuria.  Musculoskeletal:Negative for back pain, gait problem, neck pain and neck stiffness.  Skin: Negative for itching and rash.  Neurological: Negative for dizziness, extremity weakness, gait problem, headaches, light-headedness and seizures.  Hematological: Negative for adenopathy. Does not bruise/bleed easily.  Psychiatric/Behavioral: Negative for confusion, depression and sleep disturbance. The patient is not nervous/anxious.   PHYSICAL EXAMINATION:  Blood pressure 123/63, pulse 82, temperature 98 F (36.7 C), temperature source  Oral, resp. rate 18, height 5\' 1"  (1.549 m), weight 72 lb 12.8 oz (33 kg), SpO2 100 %.  ECOG PERFORMANCE STATUS: 1 - Symptomatic but completely ambulatory  Physical Exam  Constitutional: Oriented to person, place, and time and thin appearing female and in no distress.  HENT:  Head: Normocephalic and atraumatic.  Mouth/Throat: Oropharynx is clear and moist. No oropharyngeal exudate.  Eyes: Conjunctivae are normal. Right eye exhibits no discharge. Left eye exhibits no discharge. No scleral icterus.  Neck: Normal range of motion. Neck supple.  Cardiovascular: Normal rate, regular rhythm, normal heart sounds and intact distal pulses.   Pulmonary/Chest: Effort normal and breath sounds normal. No respiratory distress. No wheezes. No rales.  Abdominal: Soft. Bowel sounds are normal. Exhibits no distension and no mass. There is no tenderness.  Musculoskeletal: Normal range of motion. Exhibits no edema.  Lymphadenopathy:    No cervical adenopathy.  Neurological: Alert and oriented to person, place, and time. Exhibits muscle wasting. Gait normal. Coordination normal.  Skin: Skin is warm and dry. No rash noted. Not diaphoretic. No erythema. No pallor.  Psychiatric: Mood, memory and  judgment normal.  Vitals reviewed.  LABORATORY DATA: Lab Results  Component Value Date   WBC 6.0 10/31/2018   HGB 12.9 10/31/2018   HCT 41.7 10/31/2018   MCV 88.5 10/31/2018   PLT 284 10/31/2018      Chemistry      Component Value Date/Time   NA 143 10/31/2018 1050   K 3.4 (L) 10/31/2018 1050   CL 105 10/31/2018 1050   CO2 25 10/31/2018 1050   BUN 13 10/31/2018 1050   CREATININE 0.71 10/31/2018 1050      Component Value Date/Time   CALCIUM 9.2 10/31/2018 1050   ALKPHOS 115 10/31/2018 1050   AST 17 10/31/2018 1050   ALT 8 10/31/2018 1050   BILITOT 0.5 10/31/2018 1050       RADIOGRAPHIC STUDIES:  No results found.   ASSESSMENT/PLAN:  This is a very pleasant 73 year old Caucasian female with  stage IIIa non-small cell lung cancer, poorly differentiated squamous cell carcinoma. She presented with a large superior segment of theleft lower lobe mass with questionable left hilar adenopathy. She was diagnosed in August 2019.   The patient underwent a course of concurrent chemoradiation with carboplatin for an AUC of 2 and paclitaxel45 mg/m. Status post 7 cycles with a partial response.  The patient is currently undergoing consolidation immunotherapy with Imfinzi 10 mg/kg IV every 2 weeks. She is status post 22cycles of treatment. She continues to tolerate treatment well without any concerning adverse side effects.  Labs were reviewed. I recommend that she proceed with cycle #23 today as scheduled.   We will see her back for a follow up visit in 2 weeks for evaluation before starting cycle #24.   The patient was advised to call immediately if she has any concerning symptoms in the interval. The patient voices understanding of current disease status and treatment options and is in agreement with the current care plan. All questions were answered. The patient knows to call the clinic with any problems, questions or concerns. We can certainly see the patient much sooner if necessary  No orders of the defined types were placed in this encounter.    Kenyon Eshleman L Seerat Peaden, PA-C 10/31/18

## 2018-10-31 NOTE — Telephone Encounter (Signed)
Per 10/22 los Keep appointments as scheduled.

## 2018-10-31 NOTE — Patient Instructions (Signed)
[facility] Discharge Instructions for Patients Receiving Chemotherapy  Today you received the following chemotherapy agents: Durvalumab (Imfinzi)   To help prevent nausea and vomiting after your treatment, we encourage you to take your nausea medication as directed.   If you develop nausea and vomiting that is not controlled by your nausea medication, call the clinic.   BELOW ARE SYMPTOMS THAT SHOULD BE REPORTED IMMEDIATELY:  *FEVER GREATER THAN 100.5 F  *CHILLS WITH OR WITHOUT FEVER  NAUSEA AND VOMITING THAT IS NOT CONTROLLED WITH YOUR NAUSEA MEDICATION  *UNUSUAL SHORTNESS OF BREATH  *UNUSUAL BRUISING OR BLEEDING  TENDERNESS IN MOUTH AND THROAT WITH OR WITHOUT PRESENCE OF ULCERS  *URINARY PROBLEMS  *BOWEL PROBLEMS  UNUSUAL RASH Items with * indicate a potential emergency and should be followed up as soon as possible.  Feel free to call the clinic should you have any questions or concerns. The clinic phone number is [phone].  Please show the CHEMO ALERT CARD at check-in to the Emergency Department and triage nurse.   

## 2018-11-14 ENCOUNTER — Inpatient Hospital Stay: Payer: Medicare Other | Admitting: Internal Medicine

## 2018-11-14 ENCOUNTER — Inpatient Hospital Stay: Payer: Medicare Other

## 2018-11-14 ENCOUNTER — Telehealth: Payer: Self-pay | Admitting: *Deleted

## 2018-11-14 NOTE — Telephone Encounter (Signed)
Pt called lmovm states "I woke up with headache and cannot make it to my appts today to please r/s." Message to scheduling. MD made aware

## 2018-11-27 NOTE — Progress Notes (Signed)
[facility] OFFICE PROGRESS NOTE  [name], NP [name] [address]  DIAGNOSIS: Stage IIb/IIIa (T3, N0/N1, M0)non-small cell lung cancer poorly differentiated squamous cell carcinoma diagnosed in August 2019. The patient presented with large superior segment leftlower lobe mass with questionable left hilar adenopathy  PRIOR THERAPY: Concurrent chemoradiation with weekly carboplatin for AUC of 2 and paclitaxel 45 mg/M2. Status post 7 cycles. Last dose was given October 29, 2017 with partial response  CURRENT THERAPY: Consolidation treatment with immunotherapy with Imfinzi 10 mg/KG every 2 weeks to start December 13, 2017. Status post23cycles  INTERVAL HISTORY: Sherry [name] 73 y.o. female returns to the clinic for a follow up visit. The patient is feeling well today without any concerning complaints. She missed her last appointment secondary to having a headache which is better at this time. She attributes this to the change and weather. The patient continues to tolerate treatment with immunotherapy with [facility] well without any adverse side effects except for a occasional dry red lesions on her hands. Denies any fever, chills, night sweats, or weight loss. Denies any chest pain, cough, or hemoptysis. She reports her baseline shortness of breath with exertion. Denies any nausea, vomiting, diarrhea, or constipation. Denies any  visual changes. Denies any rashes or skin changes. The patient is here today for evaluation prior to starting cycle # 24  MEDICAL HISTORY: Past Medical History:  Diagnosis Date  . Allergy   . Anxiety   . Arthritis   . Cavitating mass in left lower lung lobe   . Complication of anesthesia    woke up during procedure  . COPD (chronic obstructive pulmonary disease) ([place])   . Depression   . Family history of adverse reaction to anesthesia    " my mother was always hard to wake up"  . Full dentures   . Headache    . Neuromuscular disorder ([place])   . Pneumonia   . Thoracic spine fracture ([place]) 11/01/2017   T9  . Wears glasses     ALLERGIES:  is allergic to omnipaque [iohexol] and penicillins.  MEDICATIONS:  Current Outpatient Medications  Medication Sig Dispense Refill  . acetaminophen (TYLENOL 8 HOUR) 650 MG CR tablet Take 1 tablet (650 mg total) by mouth every 8 (eight) hours as needed for pain or fever. 30 tablet 0  . albuterol (VENTOLIN HFA) 108 (90 Base) MCG/ACT inhaler INHALE 2 PUFFS INTO THE LUNGS EVERY 6 HOURS AS NEEDED FOR WHEEZING OR SHORTNESS OF BREATH 25.5 g 1  . alendronate (FOSAMAX) 70 MG tablet Take 70 mg by mouth once a week.    . gabapentin (NEURONTIN) 100 MG capsule Take 1 capsule (100 mg total) by mouth 3 (three) times daily. Take 1 tablet once a day 90 capsule 2  . guaifenesin (ROBITUSSIN) 100 MG/5ML syrup Take 200 mg by mouth 3 (three) times daily as needed for cough.     Marland Kitchen HYDROcodone-acetaminophen (NORCO/VICODIN) 5-325 MG tablet TK 1-2 TS PO Q 6 H PRF MODERATE PAIN    . Menthol-Ascorbic Acid (LUDENS COUGH DROPS MT) Use as directed 1 lozenge in the mouth or throat 3 (three) times daily as needed (cough).     . Multiple Vitamin (MULTIVITAMIN WITH MINERALS) TABS tablet Take 1 tablet by mouth daily.    . predniSONE (DELTASONE) 50 MG tablet Take 1 (50 mg) tablet 13 hours, 7 hours, and 2 hours before your CT scan 3 tablet 0  . prochlorperazine (COMPAZINE) 10  MG tablet Take 1 tablet (10 mg total) by mouth every 6 (six) hours as needed for nausea or vomiting. (Patient not taking: Reported on 08/22/2018) 30 tablet 0   No current facility-administered medications for this visit.     SURGICAL HISTORY:  Past Surgical History:  Procedure Laterality Date  . ELBOW SURGERY     for  "Tennis Elbow"  . hand surgery    . IR KYPHO THORACIC WITH BONE BIOPSY  11/27/2017  . IR KYPHO THORACIC WITH BONE BIOPSY  03/01/2018  . IR KYPHO THORACIC WITH BONE BIOPSY  03/26/2018  . IR RADIOLOGIST EVAL &  MGMT  11/20/2017  . IR RADIOLOGIST EVAL & MGMT  02/26/2018  . IR RADIOLOGIST EVAL & MGMT  03/13/2018  . MULTIPLE TOOTH EXTRACTIONS    . TUBAL LIGATION    . VIDEO BRONCHOSCOPY N/A 08/31/2017   Procedure: VIDEO BRONCHOSCOPY;  Surgeon: Melrose Nakayama, MD;  Location: [facility] OR;  Service: Thoracic;  Laterality: N/A;    REVIEW OF SYSTEMS:   Review of Systems  Constitutional:Negative for appetite change, fatigue, weight loss, chills, and fever. HENT:Positive for dry mouth.Negative for mouth sores, nosebleeds, sore throat and trouble swallowing.  Eyes: Negative for eye problems and icterus.  Respiratory:Positive for wheezing.Negative for cough, hemoptysis,andshortness of breath Cardiovascular: Negative for chest pain and leg swelling.  Gastrointestinal: Negative for abdominal pain, constipation, diarrhea, nausea and vomiting.  Genitourinary: Negative for bladder incontinence, difficulty urinating, dysuria, frequency and hematuria.  Musculoskeletal:Negative forback pain,gait problem, neck pain and neck stiffness.  Skin: Negative for itching and rash.  Neurological: Negative for dizziness, extremity weakness, gait problem, headaches, light-headedness and seizures.  Hematological: Negative for adenopathy. Does not bruise/bleed easily.  Psychiatric/Behavioral: Negative for confusion, depression and sleep disturbance. The patient is not nervous/anxious.   PHYSICAL EXAMINATION:  There were no vitals taken for this visit.  ECOG PERFORMANCE STATUS: 1 - Symptomatic but completely ambulatory  Physical Exam  Constitutional: Oriented to person, place, and time and thin appearing female and in no distress.  HENT:  Head: Normocephalic and atraumatic.  Mouth/Throat: Oropharynx is clear and moist. No oropharyngeal exudate.  Eyes: Conjunctivae are normal. Right eye exhibits no discharge. Left eye exhibits no discharge. No scleral icterus.  Neck: Normal range of motion. Neck supple.   Cardiovascular: Normal rate, regular rhythm, normal heart sounds and intact distal pulses.   Pulmonary/Chest: Effort normal. Wheezing in all lung fields (improved from prior) No respiratory distress.  No rales.  Abdominal: Soft. Bowel sounds are normal. Exhibits no distension and no mass. There is no tenderness.  Musculoskeletal: Normal range of motion. Exhibits no edema.  Lymphadenopathy:    No cervical adenopathy.  Neurological: Alert and oriented to person, place, and time. Exhibits muscle wasting. Gait normal. Coordination normal.  Skin: Skin is warm and dry. No rash noted. Not diaphoretic. No erythema. No pallor.  Psychiatric: Mood, memory and judgment normal.  Vitals reviewed.  LABORATORY DATA: Lab Results  Component Value Date   WBC 6.0 10/31/2018   HGB 12.9 10/31/2018   HCT 41.7 10/31/2018   MCV 88.5 10/31/2018   PLT 284 10/31/2018      Chemistry      Component Value Date/Time   NA 143 10/31/2018 1050   K 3.4 (L) 10/31/2018 1050   CL 105 10/31/2018 1050   CO2 25 10/31/2018 1050   BUN 13 10/31/2018 1050   CREATININE 0.71 10/31/2018 1050      Component Value Date/Time   CALCIUM 9.2 10/31/2018 1050  ALKPHOS 115 10/31/2018 1050   AST 17 10/31/2018 1050   ALT 8 10/31/2018 1050   BILITOT 0.5 10/31/2018 1050       RADIOGRAPHIC STUDIES:  No results found.   ASSESSMENT/PLAN:  This is a very pleasant 73 year old Caucasian female with stage IIIa non-small cell lung cancer, poorly differentiated squamous cell carcinoma. She presented with a large superior segment of theleft lower lobe mass with questionable left hilar adenopathy. She was diagnosed in August 2019.   The patient underwent a course of concurrent chemoradiation with carboplatin for an AUC of 2 and paclitaxel45 mg/m. Status post 7 cycles with a partial response.  The patient is currently undergoing consolidation immunotherapy with Imfinzi 10 mg/kg IV every 2 weeks. She is status post23cycles of  treatment. She continues to tolerate treatment well without any concerning adversesideeffects.  Labs were reviewed. I recommend that she proceed with cycle #24 today as scheduled.   We will see her back for a follow up visit in 2 weeks for evaluation before starting cycle #25.   The patient was advised to call immediately if she has any concerning symptoms in the interval. The patient voices understanding of current disease status and treatment options and is in agreement with the current care plan. All questions were answered. The patient knows to call the clinic with any problems, questions or concerns. We can certainly see the patient much sooner if necessary  No orders of the defined types were placed in this encounter.     L , PA-C 11/27/18

## 2018-11-28 ENCOUNTER — Other Ambulatory Visit: Payer: Self-pay

## 2018-11-28 ENCOUNTER — Inpatient Hospital Stay: Payer: Medicare Other

## 2018-11-28 ENCOUNTER — Inpatient Hospital Stay (HOSPITAL_BASED_OUTPATIENT_CLINIC_OR_DEPARTMENT_OTHER): Payer: Medicare Other | Admitting: Physician Assistant

## 2018-11-28 ENCOUNTER — Inpatient Hospital Stay: Payer: Medicare Other | Attending: Internal Medicine

## 2018-11-28 VITALS — HR 95

## 2018-11-28 VITALS — BP 143/79 | HR 103 | Temp 98.3°F | Resp 18 | Ht 61.0 in | Wt 71.5 lb

## 2018-11-28 DIAGNOSIS — Z79899 Other long term (current) drug therapy: Secondary | ICD-10-CM | POA: Diagnosis not present

## 2018-11-28 DIAGNOSIS — J449 Chronic obstructive pulmonary disease, unspecified: Secondary | ICD-10-CM | POA: Diagnosis not present

## 2018-11-28 DIAGNOSIS — Z5112 Encounter for antineoplastic immunotherapy: Secondary | ICD-10-CM | POA: Diagnosis not present

## 2018-11-28 DIAGNOSIS — C3492 Malignant neoplasm of unspecified part of left bronchus or lung: Secondary | ICD-10-CM | POA: Diagnosis not present

## 2018-11-28 DIAGNOSIS — C3432 Malignant neoplasm of lower lobe, left bronchus or lung: Secondary | ICD-10-CM | POA: Diagnosis present

## 2018-11-28 DIAGNOSIS — R519 Headache, unspecified: Secondary | ICD-10-CM | POA: Diagnosis not present

## 2018-11-28 LAB — CMP (CANCER CENTER ONLY)
ALT: 9 U/L (ref 0–44)
AST: 18 U/L (ref 15–41)
Albumin: 3.9 g/dL (ref 3.5–5.0)
Alkaline Phosphatase: 107 U/L (ref 38–126)
Anion gap: 11 (ref 5–15)
BUN: 16 mg/dL (ref 8–23)
CO2: 28 mmol/L (ref 22–32)
Calcium: 8.8 mg/dL — ABNORMAL LOW (ref 8.9–10.3)
Chloride: 105 mmol/L (ref 98–111)
Creatinine: 0.72 mg/dL (ref 0.44–1.00)
GFR, Est AFR Am: >60 mL/min (ref >60)
GFR, Estimated: >60 mL/min (ref >60)
Glucose, Bld: 101 mg/dL — ABNORMAL HIGH (ref 70–99)
Potassium: 3.4 mmol/L — ABNORMAL LOW (ref 3.5–5.1)
Sodium: 144 mmol/L (ref 135–145)
Total Bilirubin: 0.5 mg/dL (ref 0.3–1.2)
Total Protein: 7.4 g/dL (ref 6.5–8.1)

## 2018-11-28 LAB — CBC WITH DIFFERENTIAL (CANCER CENTER ONLY)
Abs Immature Granulocytes: 0.01 10*3/uL (ref 0.00–0.07)
Basophils Absolute: 0.1 10*3/uL (ref 0.0–0.1)
Basophils Relative: 1 %
Eosinophils Absolute: 0.1 10*3/uL (ref 0.0–0.5)
Eosinophils Relative: 1 %
HCT: 41.9 % (ref 36.0–46.0)
Hemoglobin: 12.8 g/dL (ref 12.0–15.0)
Immature Granulocytes: 0 %
Lymphocytes Relative: 17 %
Lymphs Abs: 0.8 10*3/uL (ref 0.7–4.0)
MCH: 27.5 pg (ref 26.0–34.0)
MCHC: 30.5 g/dL (ref 30.0–36.0)
MCV: 90.1 fL (ref 80.0–100.0)
Monocytes Absolute: 0.5 10*3/uL (ref 0.1–1.0)
Monocytes Relative: 9 %
Neutro Abs: 3.6 10*3/uL (ref 1.7–7.7)
Neutrophils Relative %: 72 %
Platelet Count: 269 10*3/uL (ref 150–400)
RBC: 4.65 MIL/uL (ref 3.87–5.11)
RDW: 15.9 % — ABNORMAL HIGH (ref 11.5–15.5)
WBC Count: 5 10*3/uL (ref 4.0–10.5)
nRBC: 0 % (ref 0.0–0.2)

## 2018-11-28 LAB — TSH: TSH: 2.009 u[IU]/mL (ref 0.308–3.960)

## 2018-11-28 MED ORDER — SODIUM CHLORIDE 0.9 % IV SOLN
10.5000 mg/kg | Freq: Once | INTRAVENOUS | Status: AC
  Administered 2018-11-28: 360 mg via INTRAVENOUS
  Filled 2018-11-28: qty 7.2

## 2018-11-28 MED ORDER — SODIUM CHLORIDE 0.9 % IV SOLN
Freq: Once | INTRAVENOUS | Status: AC
  Administered 2018-11-28: 17:00:00 via INTRAVENOUS
  Filled 2018-11-28: qty 250

## 2018-11-28 NOTE — Patient Instructions (Signed)
[facility] Discharge Instructions for Patients Receiving Chemotherapy  Today you received the following chemotherapy agents: Durvalumab (Imfinzi)   To help prevent nausea and vomiting after your treatment, we encourage you to take your nausea medication as directed.   If you develop nausea and vomiting that is not controlled by your nausea medication, call the clinic.   BELOW ARE SYMPTOMS THAT SHOULD BE REPORTED IMMEDIATELY:  *FEVER GREATER THAN 100.5 F  *CHILLS WITH OR WITHOUT FEVER  NAUSEA AND VOMITING THAT IS NOT CONTROLLED WITH YOUR NAUSEA MEDICATION  *UNUSUAL SHORTNESS OF BREATH  *UNUSUAL BRUISING OR BLEEDING  TENDERNESS IN MOUTH AND THROAT WITH OR WITHOUT PRESENCE OF ULCERS  *URINARY PROBLEMS  *BOWEL PROBLEMS  UNUSUAL RASH Items with * indicate a potential emergency and should be followed up as soon as possible.  Feel free to call the clinic should you have any questions or concerns. The clinic phone number is [phone].  Please show the CHEMO ALERT CARD at check-in to the Emergency Department and triage nurse.   

## 2018-11-29 ENCOUNTER — Telehealth: Payer: Self-pay

## 2018-11-29 ENCOUNTER — Telehealth: Payer: Self-pay | Admitting: Physician Assistant

## 2018-11-29 NOTE — Telephone Encounter (Signed)
Scheduled per los. Called and left msg. Mailed printout  °

## 2018-11-29 NOTE — Telephone Encounter (Signed)
Pt called requesting appointment times for 12/3 be earlier in the day.  A scheduling message was sent.  Ms Dannenberg was notified the request has been submitted.

## 2018-12-03 ENCOUNTER — Telehealth: Payer: Self-pay | Admitting: Physician Assistant

## 2018-12-03 NOTE — Telephone Encounter (Signed)
called pt per 11/20 sch message - no answer . Left message for patient to give [place] a call back if reschedule is still needed

## 2018-12-09 ENCOUNTER — Telehealth: Payer: Self-pay | Admitting: Medical Oncology

## 2018-12-09 NOTE — Telephone Encounter (Signed)
Appt

## 2018-12-11 ENCOUNTER — Other Ambulatory Visit: Payer: Self-pay | Admitting: *Deleted

## 2018-12-11 DIAGNOSIS — C3492 Malignant neoplasm of unspecified part of left bronchus or lung: Secondary | ICD-10-CM

## 2018-12-11 NOTE — Progress Notes (Signed)
[place] OFFICE PROGRESS NOTE  Sherry Harvey, NP [place] [address]  DIAGNOSIS: Stage IIb/IIIa (T3, N0/N1, M0)non-small cell lung cancer poorly differentiated squamous cell carcinoma diagnosed in August 2019. The patient presented with large superior segment leftlower lobe mass with questionable left hilar adenopathy  PRIOR THERAPY: Concurrent chemoradiation with weekly carboplatin for AUC of 2 and paclitaxel 45 mg/M2. Status post 7 cycles. Last dose was given October 29, 2017 with partial response   CURRENT THERAPY: Consolidation treatment with immunotherapy with Imfinzi 10 mg/KG every 2 weeks to start December 13, 2017. Status post24cycles  INTERVAL HISTORY: Sherry [name] 73 y.o. female returns to the clinic for a follow up visit. The patient is feeling well today without any concerning complaints. She continues to tolerate treatment with immunotherapy with Keytruda well without any adverse side effects except for a occasional dry red lesions on her hands. Denies any fever, chills, night sweats, or weight loss. Denies any chest pain, cough, or hemoptysis. She reports her baseline shortness of breath with exertion. Denies any nausea, vomiting, diarrhea, or constipation. Denies any visual changes. She gets occasional headaches that resolve after tylenol or resting. The patient is here today for evaluation prior to starting cycle # 25  MEDICAL HISTORY: Past Medical History:  Diagnosis Date  . Allergy   . Anxiety   . Arthritis   . Cavitating mass in left lower lung lobe   . Complication of anesthesia    woke up during procedure  . COPD (chronic obstructive pulmonary disease) ([place])   . Depression   . Family history of adverse reaction to anesthesia    " my mother was always hard to wake up"  . Full dentures   . Headache   . Neuromuscular disorder ([place])   . Pneumonia   . Thoracic spine fracture ([place]) 11/01/2017   T9  . Wears  glasses     ALLERGIES:  is allergic to omnipaque [iohexol] and penicillins.  MEDICATIONS:  Current Outpatient Medications  Medication Sig Dispense Refill  . acetaminophen (TYLENOL 8 HOUR) 650 MG CR tablet Take 1 tablet (650 mg total) by mouth every 8 (eight) hours as needed for pain or fever. 30 tablet 0  . albuterol (VENTOLIN HFA) 108 (90 Base) MCG/ACT inhaler INHALE 2 PUFFS INTO THE LUNGS EVERY 6 HOURS AS NEEDED FOR WHEEZING OR SHORTNESS OF BREATH 25.5 g 1  . alendronate (FOSAMAX) 70 MG tablet Take 70 mg by mouth once a week.    . gabapentin (NEURONTIN) 100 MG capsule Take 1 capsule (100 mg total) by mouth 3 (three) times daily. Take 1 tablet once a day 90 capsule 2  . guaifenesin (ROBITUSSIN) 100 MG/5ML syrup Take 200 mg by mouth 3 (three) times daily as needed for cough.     Marland Kitchen HYDROcodone-acetaminophen (NORCO/VICODIN) 5-325 MG tablet TK 1-2 TS PO Q 6 H PRF MODERATE PAIN    . Menthol-Ascorbic Acid (LUDENS COUGH DROPS MT) Use as directed 1 lozenge in the mouth or throat 3 (three) times daily as needed (cough).     . Multiple Vitamin (MULTIVITAMIN WITH MINERALS) TABS tablet Take 1 tablet by mouth daily.    . predniSONE (DELTASONE) 50 MG tablet Take 1 (50 mg) tablet 13 hours, 7 hours, and 2 hours before your CT scan 3 tablet 0  . prochlorperazine (COMPAZINE) 10 MG tablet Take 1 tablet (10 mg total) by mouth every 6 (six) hours as needed for nausea or vomiting. Finzel  tablet 0   No current facility-administered medications for this visit.    Facility-Administered Medications Ordered in Other Visits  Medication Dose Route Frequency Provider Last Rate Last Dose  . durvalumab (IMFINZI) 360 mg in sodium chloride 0.9 % 100 mL chemo infusion  10.5 mg/kg (Treatment Plan Recorded) Intravenous Once Curt Bears, MD        SURGICAL HISTORY:  Past Surgical History:  Procedure Laterality Date  . ELBOW SURGERY     for  "Tennis Elbow"  . hand surgery    . IR KYPHO THORACIC WITH BONE BIOPSY   11/27/2017  . IR KYPHO THORACIC WITH BONE BIOPSY  03/01/2018  . IR KYPHO THORACIC WITH BONE BIOPSY  03/26/2018  . IR RADIOLOGIST EVAL & MGMT  11/20/2017  . IR RADIOLOGIST EVAL & MGMT  02/26/2018  . IR RADIOLOGIST EVAL & MGMT  03/13/2018  . MULTIPLE TOOTH EXTRACTIONS    . TUBAL LIGATION    . VIDEO BRONCHOSCOPY N/A 08/31/2017   Procedure: VIDEO BRONCHOSCOPY;  Surgeon: Melrose Nakayama, MD;  Location: [facility] OR;  Service: Thoracic;  Laterality: N/A;    REVIEW OF SYSTEMS:   Review of Systems  Constitutional: Negative for appetite change, chills, fatigue, fever and unexpected weight change.  HENT: Negative for mouth sores, nosebleeds, sore throat and trouble swallowing.   Eyes: Negative for eye problems and icterus.  Respiratory: Positive for wheezing and baseline shortness of breath. Negative for cough and  hemoptysis. Cardiovascular: Negative for chest pain and leg swelling.  Gastrointestinal: Negative for abdominal pain, constipation, diarrhea, nausea and vomiting.  Genitourinary: Negative for bladder incontinence, difficulty urinating, dysuria, frequency and hematuria.   Musculoskeletal: Negative for back pain, gait problem, neck pain and neck stiffness.  Skin: Positive for occasional painless small dry skin lesions on hands. Negative for itching Neurological: Negative for dizziness, extremity weakness, gait problem, headaches, light-headedness and seizures.  Hematological: Negative for adenopathy. Does not bruise/bleed easily.  Psychiatric/Behavioral: Negative for confusion, depression and sleep disturbance. The patient is not nervous/anxious.     PHYSICAL EXAMINATION:  Blood pressure (!) 142/76, pulse (!) 110, temperature 98.5 F (36.9 C), temperature source Temporal, resp. rate 18, height 5\' 1"  (1.549 m), weight 71 lb 12.8 oz (32.6 kg), SpO2 93 %.  ECOG PERFORMANCE STATUS: 1 - Symptomatic but completely ambulatory  Physical Exam  Constitutional: Oriented to person, place, and time  andthin appearing femaleand in no distress.  HENT:  Head: Normocephalic and atraumatic.  Mouth/Throat: Oropharynx is clear and moist. No oropharyngeal exudate.  Eyes: Conjunctivae are normal. Right eye exhibits no discharge. Left eye exhibits no discharge. No scleral icterus.  Neck: Normal range of motion. Neck supple.  Cardiovascular: Tachycardic, regular rhythm, normal heart sounds and intact distal pulses.  Pulmonary/Chest: Effort normal. Wheezing in all lung fields. No respiratory distress.  No rales.  Abdominal: Soft. Bowel sounds are normal. Exhibits no distension and no mass. There is no tenderness.  Musculoskeletal: Normal range of motion. Exhibits no edema.  Lymphadenopathy:  No cervical adenopathy.  Neurological: Alert and oriented to person, place, and time. Exhibits muscle wasting.Gait normal. Coordination normal.  Skin: Skin is warm and dry. No rash noted. Not diaphoretic. No erythema. No pallor.  Psychiatric: Mood, memory and judgment normal.  Vitals reviewed.  LABORATORY DATA: Lab Results  Component Value Date   WBC 6.8 12/12/2018   HGB 13.0 12/12/2018   HCT 42.1 12/12/2018   MCV 88.8 12/12/2018   PLT 233 12/12/2018      Chemistry  Component Value Date/Time   NA 144 12/12/2018 1400   K 3.9 12/12/2018 1400   CL 105 12/12/2018 1400   CO2 31 12/12/2018 1400   BUN 12 12/12/2018 1400   CREATININE 0.71 12/12/2018 1400      Component Value Date/Time   CALCIUM 8.8 (L) 12/12/2018 1400   ALKPHOS 106 12/12/2018 1400   AST 16 12/12/2018 1400   ALT 7 12/12/2018 1400   BILITOT 0.4 12/12/2018 1400       RADIOGRAPHIC STUDIES:  No results found.   ASSESSMENT/PLAN:  This is a very pleasant 73 year old Caucasian female with stage IIIa non-small cell lung cancer, poorly differentiated squamous cell carcinoma. She presented with a large superior segment of theleft lower lobe mass with questionable left hilar adenopathy. She was diagnosed in August 2019.    The patient underwent a course of concurrent chemoradiation with carboplatin for an AUC of 2 and paclitaxel45 mg/m. Status post 7 cycles with a partial response.  The patient is currently undergoing consolidation immunotherapy with Imfinzi 10 mg/kg IV every 2 weeks. She is status post24cycles of treatment. She continues to tolerate treatment well without any concerning adversesideeffects.  Labs were reviewed. I recommend that she proceed with cycle #25 today as scheduled.   We will see her back for a follow up visit in 2 weeks for evaluation before starting cycle #26.  The patient was advised to call immediately if she has any concerning symptoms in the interval. The patient voices understanding of current disease status and treatment options and is in agreement with the current care plan. All questions were answered. The patient knows to call the clinic with any problems, questions or concerns. We can certainly see the patient much sooner if necessary   Orders Placed This Encounter  Procedures  . CBC with Differential (Cancer Center Only)    Standing Status:   Future    Standing Expiration Date:   12/12/2019  . CMP ([facility] only)    Standing Status:   Future    Standing Expiration Date:   12/12/2019     Cassandra L Heilingoetter, PA-C 12/12/18

## 2018-12-12 ENCOUNTER — Inpatient Hospital Stay: Payer: Medicare Other

## 2018-12-12 ENCOUNTER — Other Ambulatory Visit: Payer: Self-pay

## 2018-12-12 ENCOUNTER — Inpatient Hospital Stay: Payer: Medicare Other | Attending: Internal Medicine

## 2018-12-12 ENCOUNTER — Inpatient Hospital Stay (HOSPITAL_BASED_OUTPATIENT_CLINIC_OR_DEPARTMENT_OTHER): Payer: Medicare Other | Admitting: Physician Assistant

## 2018-12-12 VITALS — HR 88

## 2018-12-12 VITALS — BP 142/76 | HR 110 | Temp 98.5°F | Resp 18 | Ht 61.0 in | Wt 71.8 lb

## 2018-12-12 DIAGNOSIS — Z923 Personal history of irradiation: Secondary | ICD-10-CM | POA: Insufficient documentation

## 2018-12-12 DIAGNOSIS — C3432 Malignant neoplasm of lower lobe, left bronchus or lung: Secondary | ICD-10-CM | POA: Insufficient documentation

## 2018-12-12 DIAGNOSIS — Z5112 Encounter for antineoplastic immunotherapy: Secondary | ICD-10-CM

## 2018-12-12 DIAGNOSIS — Z79899 Other long term (current) drug therapy: Secondary | ICD-10-CM | POA: Diagnosis not present

## 2018-12-12 DIAGNOSIS — C3492 Malignant neoplasm of unspecified part of left bronchus or lung: Secondary | ICD-10-CM

## 2018-12-12 DIAGNOSIS — J449 Chronic obstructive pulmonary disease, unspecified: Secondary | ICD-10-CM | POA: Diagnosis not present

## 2018-12-12 DIAGNOSIS — Z9221 Personal history of antineoplastic chemotherapy: Secondary | ICD-10-CM | POA: Insufficient documentation

## 2018-12-12 LAB — CBC WITH DIFFERENTIAL (CANCER CENTER ONLY)
Abs Immature Granulocytes: 0.02 10*3/uL (ref 0.00–0.07)
Basophils Absolute: 0 10*3/uL (ref 0.0–0.1)
Basophils Relative: 0 %
Eosinophils Absolute: 0.1 10*3/uL (ref 0.0–0.5)
Eosinophils Relative: 1 %
HCT: 42.1 % (ref 36.0–46.0)
Hemoglobin: 13 g/dL (ref 12.0–15.0)
Immature Granulocytes: 0 %
Lymphocytes Relative: 10 %
Lymphs Abs: 0.7 10*3/uL (ref 0.7–4.0)
MCH: 27.4 pg (ref 26.0–34.0)
MCHC: 30.9 g/dL (ref 30.0–36.0)
MCV: 88.8 fL (ref 80.0–100.0)
Monocytes Absolute: 0.5 10*3/uL (ref 0.1–1.0)
Monocytes Relative: 7 %
Neutro Abs: 5.6 10*3/uL (ref 1.7–7.7)
Neutrophils Relative %: 82 %
Platelet Count: 233 10*3/uL (ref 150–400)
RBC: 4.74 MIL/uL (ref 3.87–5.11)
RDW: 16.2 % — ABNORMAL HIGH (ref 11.5–15.5)
WBC Count: 6.8 10*3/uL (ref 4.0–10.5)
nRBC: 0 % (ref 0.0–0.2)

## 2018-12-12 LAB — CMP (CANCER CENTER ONLY)
ALT: 7 U/L (ref 0–44)
AST: 16 U/L (ref 15–41)
Albumin: 3.8 g/dL (ref 3.5–5.0)
Alkaline Phosphatase: 106 U/L (ref 38–126)
Anion gap: 8 (ref 5–15)
BUN: 12 mg/dL (ref 8–23)
CO2: 31 mmol/L (ref 22–32)
Calcium: 8.8 mg/dL — ABNORMAL LOW (ref 8.9–10.3)
Chloride: 105 mmol/L (ref 98–111)
Creatinine: 0.71 mg/dL (ref 0.44–1.00)
GFR, Est AFR Am: >60 mL/min (ref >60)
GFR, Estimated: >60 mL/min (ref >60)
Glucose, Bld: 91 mg/dL (ref 70–99)
Potassium: 3.9 mmol/L (ref 3.5–5.1)
Sodium: 144 mmol/L (ref 135–145)
Total Bilirubin: 0.4 mg/dL (ref 0.3–1.2)
Total Protein: 7.1 g/dL (ref 6.5–8.1)

## 2018-12-12 MED ORDER — SODIUM CHLORIDE 0.9 % IV SOLN
Freq: Once | INTRAVENOUS | Status: AC
  Administered 2018-12-12: 15:00:00 via INTRAVENOUS
  Filled 2018-12-12: qty 250

## 2018-12-12 MED ORDER — SODIUM CHLORIDE 0.9 % IV SOLN
10.5000 mg/kg | Freq: Once | INTRAVENOUS | Status: AC
  Administered 2018-12-12: 16:00:00 360 mg via INTRAVENOUS
  Filled 2018-12-12: qty 7.2

## 2018-12-12 NOTE — Patient Instructions (Signed)
[facility] Discharge Instructions for Patients Receiving Chemotherapy  Today you received the following chemotherapy agents: Durvalumab (Imfinzi)   To help prevent nausea and vomiting after your treatment, we encourage you to take your nausea medication as directed.   If you develop nausea and vomiting that is not controlled by your nausea medication, call the clinic.   BELOW ARE SYMPTOMS THAT SHOULD BE REPORTED IMMEDIATELY:  *FEVER GREATER THAN 100.5 F  *CHILLS WITH OR WITHOUT FEVER  NAUSEA AND VOMITING THAT IS NOT CONTROLLED WITH YOUR NAUSEA MEDICATION  *UNUSUAL SHORTNESS OF BREATH  *UNUSUAL BRUISING OR BLEEDING  TENDERNESS IN MOUTH AND THROAT WITH OR WITHOUT PRESENCE OF ULCERS  *URINARY PROBLEMS  *BOWEL PROBLEMS  UNUSUAL RASH Items with * indicate a potential emergency and should be followed up as soon as possible.  Feel free to call the clinic should you have any questions or concerns. The clinic phone number is [phone].  Please show the CHEMO ALERT CARD at check-in to the Emergency Department and triage nurse.   

## 2018-12-13 ENCOUNTER — Telehealth: Payer: Self-pay | Admitting: Physician Assistant

## 2018-12-13 NOTE — Telephone Encounter (Signed)
Called the patient to confirm her updated appointment times on 12/26/2018. While speaking to her she stated that she was had experienced chest soreness/discomfort which she attributed to her positioning while in infusion yesterday. She states that it is better at this time, but I discussed concerning symptoms for cardiac related chest pain with her. She was instructed that if she is experiencing/ or experiences chest pain again, that she should be evaluated in the emergency department. She expressed understanding.

## 2018-12-13 NOTE — Telephone Encounter (Signed)
Scheduled per los. Called and left msg. Mailed printout  °

## 2018-12-19 ENCOUNTER — Telehealth: Payer: Self-pay | Admitting: *Deleted

## 2018-12-19 NOTE — Telephone Encounter (Signed)
Received vm call from pt asking if she can take Megace for appetite per her PCP, Eldridge Abrahams.  Message routed to Dr Mohamed/pod RN.

## 2018-12-20 ENCOUNTER — Telehealth: Payer: Self-pay | Admitting: *Deleted

## 2018-12-20 NOTE — Telephone Encounter (Signed)
Spoke with patient regarding megace. She says she eats all day long, she feels like she "has so much stuff in my system". Has decided not to take Megace

## 2018-12-26 ENCOUNTER — Telehealth: Payer: Self-pay | Admitting: Physician Assistant

## 2018-12-26 ENCOUNTER — Telehealth: Payer: Self-pay | Admitting: *Deleted

## 2018-12-26 ENCOUNTER — Inpatient Hospital Stay: Payer: Medicare Other

## 2018-12-26 ENCOUNTER — Inpatient Hospital Stay: Payer: Medicare Other | Admitting: Physician Assistant

## 2018-12-26 NOTE — Telephone Encounter (Signed)
Per Cassandra Heilingoetter, PA called patient to see if she wanted her next appt 01/09/2019 to be an infusion appt or to let it be a CT result appt.  Per Cassie if the patient wishes she can let her last infusion be her last one and then we can do scan to reevaluate at the next appt or she can have her final treatment on 01/09/2019 and then they can do the scan following that.  Either way decision is hers.  Left message, pending call back.    Depending on decision Cassie would need to enter scan if that is the route patient chooses.

## 2018-12-26 NOTE — Telephone Encounter (Signed)
Spoke to the patient about her missed appointment today. She is not feeling well and is getting ready to go to her PCP. She has nausea/vomiting/diarrhea. She is requesting to reschedule her appointment to 01/09/2019. Scheduling message sent. Will cancel appointments for today.

## 2018-12-26 NOTE — Progress Notes (Deleted)
[place] OFFICE PROGRESS NOTE  [name], NP [place] [address]  DIAGNOSIS: Stage IIb/IIIa (T3, N0/N1, M0)non-small cell lung cancer poorly differentiated squamous cell carcinoma diagnosed in August 2019. The patient presented with large superior segment leftlower lobe mass with questionable left hilar adenopathy  PRIOR THERAPY: Concurrent chemoradiation with weekly carboplatin for AUC of 2 and paclitaxel 45 mg/M2. Status post 7 cycles. Last dose was given October 29, 2017 with partial response  CURRENT THERAPY: Consolidation treatment with immunotherapy with Imfinzi 10 mg/KG every 2 weeks to start December 13, 2017. Status post25cycles  INTERVAL HISTORY: Sherry [name] 73 y.o. female returns to the clinic for a follow up visit. The patient is feeling well today without any concerning complaints. She continues to tolerate treatment with immunotherapy with [place] without any adverse sideeffectsexcept for a occasional dry red lesions on her hands. Denies any fever, chills, night sweats, or weight loss. Denies any chest pain, cough, or hemoptysis.She reports her baseline shortness of breath with exertion.Denies any nausea, vomiting, diarrhea, or constipation. Denies any visual changes. She gets occasional headaches that resolve after tylenol or resting. The patient is here today for evaluation prior to starting cycle # 26   MEDICAL HISTORY: Past Medical History:  Diagnosis Date  . Allergy   . Anxiety   . Arthritis   . Cavitating mass in left lower lung lobe   . Complication of anesthesia    woke up during procedure  . COPD (chronic obstructive pulmonary disease) ([place])   . Depression   . Family history of adverse reaction to anesthesia    " my mother was always hard to wake up"  . Full dentures   . Headache   . Neuromuscular disorder ([place])   . Pneumonia   . Thoracic spine fracture ([place]) 11/01/2017   T9  . Wears  glasses     ALLERGIES:  is allergic to omnipaque [iohexol] and penicillins.  MEDICATIONS:  Current Outpatient Medications  Medication Sig Dispense Refill  . acetaminophen (TYLENOL 8 HOUR) 650 MG CR tablet Take 1 tablet (650 mg total) by mouth every 8 (eight) hours as needed for pain or fever. 30 tablet 0  . albuterol (VENTOLIN HFA) 108 (90 Base) MCG/ACT inhaler INHALE 2 PUFFS INTO THE LUNGS EVERY 6 HOURS AS NEEDED FOR WHEEZING OR SHORTNESS OF BREATH 25.5 g 1  . alendronate (FOSAMAX) 70 MG tablet Take 70 mg by mouth once a week.    . gabapentin (NEURONTIN) 100 MG capsule Take 1 capsule (100 mg total) by mouth 3 (three) times daily. Take 1 tablet once a day 90 capsule 2  . guaifenesin (ROBITUSSIN) 100 MG/5ML syrup Take 200 mg by mouth 3 (three) times daily as needed for cough.     Marland Kitchen HYDROcodone-acetaminophen (NORCO/VICODIN) 5-325 MG tablet TK 1-2 TS PO Q 6 H PRF MODERATE PAIN    . Menthol-Ascorbic Acid (LUDENS COUGH DROPS MT) Use as directed 1 lozenge in the mouth or throat 3 (three) times daily as needed (cough).     . Multiple Vitamin (MULTIVITAMIN WITH MINERALS) TABS tablet Take 1 tablet by mouth daily.    . predniSONE (DELTASONE) 50 MG tablet Take 1 (50 mg) tablet 13 hours, 7 hours, and 2 hours before your CT scan 3 tablet 0  . prochlorperazine (COMPAZINE) 10 MG tablet Take 1 tablet (10 mg total) by mouth every 6 (six) hours as needed for nausea or vomiting. 30 tablet 0   No  current facility-administered medications for this visit.    SURGICAL HISTORY:  Past Surgical History:  Procedure Laterality Date  . ELBOW SURGERY     for  "Tennis Elbow"  . hand surgery    . IR KYPHO THORACIC WITH BONE BIOPSY  11/27/2017  . IR KYPHO THORACIC WITH BONE BIOPSY  03/01/2018  . IR KYPHO THORACIC WITH BONE BIOPSY  03/26/2018  . IR RADIOLOGIST EVAL & MGMT  11/20/2017  . IR RADIOLOGIST EVAL & MGMT  02/26/2018  . IR RADIOLOGIST EVAL & MGMT  03/13/2018  . MULTIPLE TOOTH EXTRACTIONS    . TUBAL LIGATION     . VIDEO BRONCHOSCOPY N/A 08/31/2017   Procedure: VIDEO BRONCHOSCOPY;  Surgeon: Melrose Nakayama, MD;  Location: [facility] OR;  Service: Thoracic;  Laterality: N/A;    REVIEW OF SYSTEMS:   Review of Systems  Constitutional: Negative for appetite change, chills, fatigue, fever and unexpected weight change.  HENT:   Negative for mouth sores, nosebleeds, sore throat and trouble swallowing.   Eyes: Negative for eye problems and icterus.  Respiratory: Negative for cough, hemoptysis, shortness of breath and wheezing.   Cardiovascular: Negative for chest pain and leg swelling.  Gastrointestinal: Negative for abdominal pain, constipation, diarrhea, nausea and vomiting.  Genitourinary: Negative for bladder incontinence, difficulty urinating, dysuria, frequency and hematuria.   Musculoskeletal: Negative for back pain, gait problem, neck pain and neck stiffness.  Skin: Negative for itching and rash.  Neurological: Negative for dizziness, extremity weakness, gait problem, headaches, light-headedness and seizures.  Hematological: Negative for adenopathy. Does not bruise/bleed easily.  Psychiatric/Behavioral: Negative for confusion, depression and sleep disturbance. The patient is not nervous/anxious.     PHYSICAL EXAMINATION:  There were no vitals taken for this visit.  ECOG PERFORMANCE STATUS: {CHL ONC ECOG [id number]  Physical Exam  Constitutional: Oriented to person, place, and time and well-developed, well-nourished, and in no distress. No distress.  HENT:  Head: Normocephalic and atraumatic.  Mouth/Throat: Oropharynx is clear and moist. No oropharyngeal exudate.  Eyes: Conjunctivae are normal. Right eye exhibits no discharge. Left eye exhibits no discharge. No scleral icterus.  Neck: Normal range of motion. Neck supple.  Cardiovascular: Normal rate, regular rhythm, normal heart sounds and intact distal pulses.   Pulmonary/Chest: Effort normal and breath sounds normal. No respiratory  distress. No wheezes. No rales.  Abdominal: Soft. Bowel sounds are normal. Exhibits no distension and no mass. There is no tenderness.  Musculoskeletal: Normal range of motion. Exhibits no edema.  Lymphadenopathy:    No cervical adenopathy.  Neurological: Alert and oriented to person, place, and time. Exhibits normal muscle tone. Gait normal. Coordination normal.  Skin: Skin is warm and dry. No rash noted. Not diaphoretic. No erythema. No pallor.  Psychiatric: Mood, memory and judgment normal.  Vitals reviewed.  LABORATORY DATA: Lab Results  Component Value Date   WBC 6.8 12/12/2018   HGB 13.0 12/12/2018   HCT 42.1 12/12/2018   MCV 88.8 12/12/2018   PLT 233 12/12/2018      Chemistry      Component Value Date/Time   NA 144 12/12/2018 1400   K 3.9 12/12/2018 1400   CL 105 12/12/2018 1400   CO2 31 12/12/2018 1400   BUN 12 12/12/2018 1400   CREATININE 0.71 12/12/2018 1400      Component Value Date/Time   CALCIUM 8.8 (L) 12/12/2018 1400   ALKPHOS 106 12/12/2018 1400   AST 16 12/12/2018 1400   ALT 7 12/12/2018 1400   BILITOT 0.4 12/12/2018 1400  RADIOGRAPHIC STUDIES:  No results found.   ASSESSMENT/PLAN:  This is a very pleasant 73 year old Caucasian female with stage IIIa non-small cell lung cancer, poorly differentiated squamous cell carcinoma. She presented with a large superior segment of theleft lower lobe mass with questionable left hilar adenopathy. She was diagnosed in August 2019.   The patient underwent a course of concurrent chemoradiation with carboplatin for an AUC of 2 and paclitaxel45 mg/m. Status post 7 cycles with a partial response.  The patient is currently undergoing consolidation immunotherapy with Imfinzi 10 mg/kg IV every 2 weeks. She is status post25cycles of treatment. She continues to tolerate treatment well without any concerning adversesideeffects.  The patient was seen with Dr. Julien Nordmann today. Labs were reviewed. We recommend  that she proceed with cycle #26today as scheduled.  I will arrange for the patient to have a restaging CT scan of her chest performed before her next visit.   We will see her back in about 2-3 weeks for evaluation and to review her scan results.   The patient was advised to call immediately if she has any concerning symptoms in the interval. The patient voices understanding of current disease status and treatment options and is in agreement with the current care plan. All questions were answered. The patient knows to call the clinic with any problems, questions or concerns. We can certainly see the patient much sooner if necessary    No orders of the defined types were placed in this encounter.    Brandilyn Nanninga L Tyrrell Stephens, PA-C 12/26/18

## 2018-12-27 ENCOUNTER — Ambulatory Visit: Payer: Medicare Other

## 2018-12-27 ENCOUNTER — Ambulatory Visit: Payer: Medicare Other | Admitting: Physician Assistant

## 2018-12-27 ENCOUNTER — Other Ambulatory Visit: Payer: Medicare Other

## 2018-12-31 ENCOUNTER — Telehealth: Payer: Self-pay | Admitting: Physician Assistant

## 2018-12-31 NOTE — Telephone Encounter (Signed)
r/s appt per 12/17 sch message - unable to reach . Left message with new appt date and time

## 2019-01-07 ENCOUNTER — Other Ambulatory Visit: Payer: Self-pay | Admitting: Physician Assistant

## 2019-01-07 ENCOUNTER — Telehealth: Payer: Self-pay | Admitting: *Deleted

## 2019-01-07 ENCOUNTER — Telehealth: Payer: Self-pay | Admitting: Internal Medicine

## 2019-01-07 DIAGNOSIS — C3492 Malignant neoplasm of unspecified part of left bronchus or lung: Secondary | ICD-10-CM

## 2019-01-07 NOTE — Telephone Encounter (Signed)
Sounds good. Thank you

## 2019-01-07 NOTE — Telephone Encounter (Signed)
Received call from patient. She states she has a headache and a sore throat today and wants to cancel her appt for 01/09/19. Denies fevers, chills, SOB.  She states she has not been around other people. Does not want to get Covid test at this time. Dr. Julien Nordmann notified.  Scheduling message sent to have her appts re-scheduled.

## 2019-01-07 NOTE — Telephone Encounter (Signed)
R/s appt per 12/29 sch message - pt is aware of appt date and time

## 2019-01-09 ENCOUNTER — Telehealth: Payer: Self-pay

## 2019-01-09 ENCOUNTER — Other Ambulatory Visit: Payer: Medicare Other

## 2019-01-09 ENCOUNTER — Ambulatory Visit: Payer: Medicare Other

## 2019-01-09 ENCOUNTER — Ambulatory Visit: Payer: Medicare Other | Admitting: Physician Assistant

## 2019-01-09 NOTE — Telephone Encounter (Signed)
I spoke to Ms Dorminey and told her to please schedule her Ct scan for January 2021.  She verbalized understanding.

## 2019-01-09 NOTE — Telephone Encounter (Signed)
Sherry [name] called stating she had a message to schedule a CT scan.  She doesn't think she was going to get a CT scan yet.  When should she schedule this.  I didn't see mention of CT in her notes

## 2019-01-16 ENCOUNTER — Encounter (HOSPITAL_COMMUNITY): Payer: Self-pay

## 2019-01-16 ENCOUNTER — Telehealth: Payer: Self-pay | Admitting: *Deleted

## 2019-01-16 ENCOUNTER — Other Ambulatory Visit: Payer: Self-pay

## 2019-01-16 ENCOUNTER — Other Ambulatory Visit: Payer: Self-pay | Admitting: Physician Assistant

## 2019-01-16 ENCOUNTER — Ambulatory Visit (HOSPITAL_COMMUNITY)
Admission: RE | Admit: 2019-01-16 | Discharge: 2019-01-16 | Disposition: A | Discharge status: Home / Self Care | Payer: Medicare Other | Source: Ambulatory Visit | Attending: Physician Assistant | Admitting: Physician Assistant

## 2019-01-16 DIAGNOSIS — C3492 Malignant neoplasm of unspecified part of left bronchus or lung: Secondary | ICD-10-CM

## 2019-01-16 MED ORDER — PREDNISONE 50 MG PO TABS: ORAL_TABLET | ORAL | 0 refills | Status: DC

## 2019-01-16 MED ORDER — SODIUM CHLORIDE (PF) 0.9 % IJ SOLN
INTRAMUSCULAR | Status: AC
  Filled 2019-01-16: qty 50

## 2019-01-16 NOTE — Telephone Encounter (Signed)
We were notified that patient did not have her Prednisone & Benadryl prep prior to her CT that was scheduled today.  Prednisone was ordered today 50 mg 13 hours before, 7 hours prior and 2 hours prior and also Benadryl 25 mg 2 hours prior.    She will need to get her CT rescheduled and her lab and visit with Cassie rescheduled once her CT is scheduled.  Patient made aware, no further questions.

## 2019-01-20 ENCOUNTER — Ambulatory Visit: Payer: Medicare Other | Admitting: Physician Assistant

## 2019-01-20 ENCOUNTER — Other Ambulatory Visit: Payer: Medicare Other

## 2019-01-21 ENCOUNTER — Other Ambulatory Visit: Payer: Self-pay | Admitting: Physician Assistant

## 2019-01-21 ENCOUNTER — Telehealth: Payer: Self-pay | Admitting: *Deleted

## 2019-01-21 NOTE — Telephone Encounter (Signed)
Called patient to inquire about getting her CT rescheduled and then also her lab and visit with Cassandra Heilingoetter, PA rescheduled.  Left detailed message and pending call back.

## 2019-01-23 ENCOUNTER — Telehealth: Payer: Self-pay | Admitting: *Deleted

## 2019-01-23 NOTE — Telephone Encounter (Signed)
Attempted again to reach patient to discuss the need for her to reschedule her CT and then Lab and follow up with Cassie.  Left message pending call back

## 2019-01-24 ENCOUNTER — Telehealth: Payer: Self-pay | Admitting: Physician Assistant

## 2019-01-24 NOTE — Telephone Encounter (Signed)
I talk with patient regarding schedule  

## 2019-01-27 ENCOUNTER — Other Ambulatory Visit: Payer: Self-pay | Admitting: Physician Assistant

## 2019-01-27 DIAGNOSIS — C3492 Malignant neoplasm of unspecified part of left bronchus or lung: Secondary | ICD-10-CM

## 2019-01-27 DIAGNOSIS — K130 Diseases of lips: Secondary | ICD-10-CM

## 2019-01-29 ENCOUNTER — Ambulatory Visit (HOSPITAL_COMMUNITY): Admission: RE | Admit: 2019-01-29 | Payer: Medicare Other | Source: Ambulatory Visit

## 2019-01-29 ENCOUNTER — Telehealth: Payer: Self-pay | Admitting: Medical Oncology

## 2019-01-29 ENCOUNTER — Inpatient Hospital Stay: Payer: Medicare Other

## 2019-01-29 NOTE — Telephone Encounter (Signed)
Pt slept through alarm and missed her labs and CT scan today.

## 2019-01-30 ENCOUNTER — Ambulatory Visit: Payer: Medicare Other | Admitting: Physician Assistant

## 2019-01-30 ENCOUNTER — Other Ambulatory Visit: Payer: Medicare Other

## 2019-01-31 ENCOUNTER — Telehealth: Payer: Self-pay | Admitting: Physician Assistant

## 2019-01-31 NOTE — Telephone Encounter (Signed)
Scheduled appt per 1/22 sch message - unable to reach pt . Left message with appt date and time

## 2019-02-05 ENCOUNTER — Telehealth: Payer: Self-pay | Admitting: Medical Oncology
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
## 2019-02-06 ENCOUNTER — Telehealth: Payer: Self-pay | Admitting: Internal Medicine

## 2019-02-06 ENCOUNTER — Inpatient Hospital Stay: Payer: Medicare Other

## 2019-02-06 ENCOUNTER — Ambulatory Visit (HOSPITAL_COMMUNITY): Admission: RE | Admit: 2019-02-06 | Payer: Medicare Other | Source: Ambulatory Visit

## 2019-02-06 NOTE — Telephone Encounter (Signed)
R/s apt per 1/27 sch message - unable to reach pt . Left message with appt date and time

## 2019-02-10 ENCOUNTER — Ambulatory Visit: Payer: Medicare Other | Admitting: Internal Medicine

## 2019-02-20 ENCOUNTER — Inpatient Hospital Stay: Payer: Medicare Other | Attending: Internal Medicine

## 2019-02-20 ENCOUNTER — Ambulatory Visit (HOSPITAL_COMMUNITY)
Admission: RE | Admit: 2019-02-20 | Discharge: 2019-02-20 | Disposition: A | Discharge status: Home / Self Care | Payer: Medicare Other | Source: Ambulatory Visit | Attending: Physician Assistant | Admitting: Physician Assistant

## 2019-02-20 ENCOUNTER — Other Ambulatory Visit: Payer: Self-pay

## 2019-02-20 DIAGNOSIS — R42 Dizziness and giddiness: Secondary | ICD-10-CM | POA: Insufficient documentation

## 2019-02-20 DIAGNOSIS — R634 Abnormal weight loss: Secondary | ICD-10-CM | POA: Diagnosis not present

## 2019-02-20 DIAGNOSIS — R0981 Nasal congestion: Secondary | ICD-10-CM | POA: Insufficient documentation

## 2019-02-20 DIAGNOSIS — Z79899 Other long term (current) drug therapy: Secondary | ICD-10-CM | POA: Insufficient documentation

## 2019-02-20 DIAGNOSIS — C3432 Malignant neoplasm of lower lobe, left bronchus or lung: Secondary | ICD-10-CM | POA: Diagnosis present

## 2019-02-20 DIAGNOSIS — Z7952 Long term (current) use of systemic steroids: Secondary | ICD-10-CM | POA: Insufficient documentation

## 2019-02-20 DIAGNOSIS — M1990 Unspecified osteoarthritis, unspecified site: Secondary | ICD-10-CM | POA: Diagnosis not present

## 2019-02-20 DIAGNOSIS — C3492 Malignant neoplasm of unspecified part of left bronchus or lung: Secondary | ICD-10-CM | POA: Insufficient documentation

## 2019-02-20 DIAGNOSIS — J449 Chronic obstructive pulmonary disease, unspecified: Secondary | ICD-10-CM | POA: Diagnosis not present

## 2019-02-20 DIAGNOSIS — I10 Essential (primary) hypertension: Secondary | ICD-10-CM | POA: Insufficient documentation

## 2019-02-20 LAB — CBC WITH DIFFERENTIAL (CANCER CENTER ONLY)
Abs Immature Granulocytes: 0.01 10*3/uL (ref 0.00–0.07)
Basophils Absolute: 0 10*3/uL (ref 0.0–0.1)
Basophils Relative: 0 %
Eosinophils Absolute: 0 10*3/uL (ref 0.0–0.5)
Eosinophils Relative: 0 %
HCT: 44.2 % (ref 36.0–46.0)
Hemoglobin: 14.1 g/dL (ref 12.0–15.0)
Immature Granulocytes: 0 %
Lymphocytes Relative: 8 %
Lymphs Abs: 0.3 10*3/uL — ABNORMAL LOW (ref 0.7–4.0)
MCH: 28.7 pg (ref 26.0–34.0)
MCHC: 31.9 g/dL (ref 30.0–36.0)
MCV: 90 fL (ref 80.0–100.0)
Monocytes Absolute: 0 10*3/uL — ABNORMAL LOW (ref 0.1–1.0)
Monocytes Relative: 1 %
Neutro Abs: 3.8 10*3/uL (ref 1.7–7.7)
Neutrophils Relative %: 91 %
Platelet Count: 279 10*3/uL (ref 150–400)
RBC: 4.91 MIL/uL (ref 3.87–5.11)
RDW: 15.9 % — ABNORMAL HIGH (ref 11.5–15.5)
WBC Count: 4.1 10*3/uL (ref 4.0–10.5)
nRBC: 0 % (ref 0.0–0.2)

## 2019-02-20 LAB — CMP (CANCER CENTER ONLY)
ALT: 7 U/L (ref 0–44)
AST: 13 U/L — ABNORMAL LOW (ref 15–41)
Albumin: 3.9 g/dL (ref 3.5–5.0)
Alkaline Phosphatase: 105 U/L (ref 38–126)
Anion gap: 11 (ref 5–15)
BUN: 14 mg/dL (ref 8–23)
CO2: 28 mmol/L (ref 22–32)
Calcium: 8.9 mg/dL (ref 8.9–10.3)
Chloride: 103 mmol/L (ref 98–111)
Creatinine: 0.8 mg/dL (ref 0.44–1.00)
GFR, Est AFR Am: >60 mL/min (ref >60)
GFR, Estimated: >60 mL/min (ref >60)
Glucose, Bld: 163 mg/dL — ABNORMAL HIGH (ref 70–99)
Potassium: 3.4 mmol/L — ABNORMAL LOW (ref 3.5–5.1)
Sodium: 142 mmol/L (ref 135–145)
Total Bilirubin: 0.4 mg/dL (ref 0.3–1.2)
Total Protein: 7.4 g/dL (ref 6.5–8.1)

## 2019-02-20 LAB — TSH: TSH: 1.302 u[IU]/mL (ref 0.308–3.960)

## 2019-02-20 MED ORDER — SODIUM CHLORIDE (PF) 0.9 % IJ SOLN
INTRAMUSCULAR | Status: AC
  Filled 2019-02-20: qty 50

## 2019-02-20 MED ORDER — IOHEXOL 300 MG/ML  SOLN
75.0000 mL | Freq: Once | INTRAMUSCULAR | Status: AC | PRN
  Administered 2019-02-20: 75 mL via INTRAVENOUS

## 2019-02-24 ENCOUNTER — Telehealth: Payer: Self-pay | Admitting: Medical Oncology

## 2019-02-24 ENCOUNTER — Inpatient Hospital Stay: Payer: Medicare Other | Admitting: Internal Medicine

## 2019-02-24 NOTE — Telephone Encounter (Signed)
Cancelled appt due to "sore throat ". Requests echart visit or appt next. "I don't know how to do mychart visit".  Schedule message sent.

## 2019-02-27 ENCOUNTER — Telehealth: Payer: Self-pay | Admitting: Internal Medicine

## 2019-02-27 NOTE — Telephone Encounter (Signed)
Per 2/15 sch msg. Left VM for pt to rs 2/15 appt.

## 2019-02-28 ENCOUNTER — Telehealth: Payer: Self-pay | Admitting: Medical Oncology

## 2019-02-28 NOTE — Telephone Encounter (Signed)
R/s f/u appt next Friday 2/26  with Cassie to review scans. Pt needed mid morning appt.

## 2019-02-28 NOTE — Telephone Encounter (Signed)
I will not be here next Friday morning but and may be here later in the afternoon.  Please coordinate with Cassie.  Thank you.

## 2019-03-05 NOTE — Progress Notes (Signed)
[place] OFFICE PROGRESS NOTE  [name], NP [place] [address]  DIAGNOSIS: Stage IIb/IIIa (T3, N0/N1, M0)non-small cell lung cancer poorly differentiated squamous cell carcinoma diagnosed in August 2019. The patient presented with large superior segment leftlower lobe mass with questionable left hilar adenopathy  PRIOR THERAPY: 1) Concurrent chemoradiation with weekly carboplatin for AUC of 2 and paclitaxel 45 mg/M2. Status post 7 cycles. Last dose was given October 29, 2017 with partial response 2) Consolidation treatment with immunotherapy with Imfinzi 10 mg/KG every 2 weeks. Status post25cycles. Last dose 12/12/2019  CURRENT THERAPY: Observation  INTERVAL HISTORY: Sherry [name] 74 y.o. female returns to the clinic. The patient missed several appointments over the last few months secondary to her son having major surgery, having a sore throat, and having to move. She completed 25 cycles of consolidation immunotherapy. Today, she denies fever, chills, night sweats, or weight loss. She is very thin but states that her appetite is good and that she "eats like a pig" and she nibbles all day long.  She denies chest pain, shortness of breath, cough, or hemoptysis. She denies nausea, vomiting, diarrhea, or constipation. She denies headaches or visual changes. She has been having trouble with allergies, nasal congestion, and left ear hearing changes. She states it sounds like she is "in a wind tunnel". She also notes dizziness when she lays down or gets up in the morning. She is inquiring if she should have the COVID-19 vaccine.  She recently had a restaging CT scan. She is here for evaluation and to review her scan.   MEDICAL HISTORY: Past Medical History:  Diagnosis Date  . Allergy   . Anxiety   . Arthritis   . Cavitating mass in left lower lung lobe   . Complication of anesthesia    woke up during procedure  . COPD (chronic  obstructive pulmonary disease) ([place])   . Depression   . Family history of adverse reaction to anesthesia    " my mother was always hard to wake up"  . Full dentures   . Headache   . Neuromuscular disorder ([place])   . Pneumonia   . Thoracic spine fracture ([place]) 11/01/2017   T9  . Wears glasses     ALLERGIES:  is allergic to omnipaque [iohexol] and penicillins.  MEDICATIONS:  Current Outpatient Medications  Medication Sig Dispense Refill  . acetaminophen (TYLENOL 8 HOUR) 650 MG CR tablet Take 1 tablet (650 mg total) by mouth every 8 (eight) hours as needed for pain or fever. 30 tablet 0  . albuterol (VENTOLIN HFA) 108 (90 Base) MCG/ACT inhaler INHALE 2 PUFFS INTO THE LUNGS EVERY 6 HOURS AS NEEDED FOR WHEEZING OR SHORTNESS OF BREATH 25.5 g 1  . alendronate (FOSAMAX) 70 MG tablet Take 70 mg by mouth once a week.    . gabapentin (NEURONTIN) 100 MG capsule Take 1 capsule (100 mg total) by mouth 3 (three) times daily. Take 1 tablet once a day 90 capsule 2  . guaifenesin (ROBITUSSIN) 100 MG/5ML syrup Take 200 mg by mouth 3 (three) times daily as needed for cough.     Marland Kitchen HYDROcodone-acetaminophen (NORCO/VICODIN) 5-325 MG tablet TK 1-2 TS PO Q 6 H PRF MODERATE PAIN    . Menthol-Ascorbic Acid (LUDENS COUGH DROPS MT) Use as directed 1 lozenge in the mouth or throat 3 (three) times daily as needed (cough).     . Multiple Vitamin (MULTIVITAMIN WITH MINERALS) TABS tablet Take  1 tablet by mouth daily.    . predniSONE (DELTASONE) 50 MG tablet Take 1 (50 mg) tablet 13 hours, 7 hours, and 2 hours before your CT scan 3 tablet 0  . prochlorperazine (COMPAZINE) 10 MG tablet Take 1 tablet (10 mg total) by mouth every 6 (six) hours as needed for nausea or vomiting. 30 tablet 0   No current facility-administered medications for this visit.    SURGICAL HISTORY:  Past Surgical History:  Procedure Laterality Date  . ELBOW SURGERY     for  "Tennis Elbow"  . hand surgery    . IR KYPHO THORACIC WITH BONE BIOPSY   11/27/2017  . IR KYPHO THORACIC WITH BONE BIOPSY  03/01/2018  . IR KYPHO THORACIC WITH BONE BIOPSY  03/26/2018  . IR RADIOLOGIST EVAL & MGMT  11/20/2017  . IR RADIOLOGIST EVAL & MGMT  02/26/2018  . IR RADIOLOGIST EVAL & MGMT  03/13/2018  . MULTIPLE TOOTH EXTRACTIONS    . TUBAL LIGATION    . VIDEO BRONCHOSCOPY N/A 08/31/2017   Procedure: VIDEO BRONCHOSCOPY;  Surgeon: Melrose Nakayama, MD;  Location: [facility] OR;  Service: Thoracic;  Laterality: N/A;    REVIEW OF SYSTEMS:   Review of Systems  Constitutional: Negative for appetite change, chills, fatigue, fever and unexpected weight change.  HENT: Positive for hearing changes in left ear. Negative for mouth sores, nosebleeds, sore throat and trouble swallowing.   Eyes: Negative for eye problems and icterus.  Respiratory: Negative for cough, hemoptysis, shortness of breath and wheezing.   Cardiovascular: Negative for chest pain and leg swelling.  Gastrointestinal: Negative for abdominal pain, constipation, diarrhea, nausea and vomiting.  Genitourinary: Negative for bladder incontinence, difficulty urinating, dysuria, frequency and hematuria.   Musculoskeletal: Negative for back pain, gait problem, neck pain and neck stiffness.  Skin: Negative for itching and rash.  Neurological: Positive for dizziness. Negative for extremity weakness, gait problem, headaches, light-headedness and seizures.  Hematological: Negative for adenopathy. Does not bruise/bleed easily.  Psychiatric/Behavioral: Negative for confusion, depression and sleep disturbance. The patient is not nervous/anxious.     PHYSICAL EXAMINATION:  Blood pressure (!) 151/88, pulse 99, temperature 98.2 F (36.8 C), temperature source Temporal, resp. rate 18, height 5\' 1"  (1.549 m), weight 68 lb 14.4 oz (31.3 kg), SpO2 95 %.  ECOG PERFORMANCE STATUS: 1 - Symptomatic but completely ambulatory  Physical Exam  Constitutional: Oriented to person, place, and time and thin-appearing female and in  no distress. HENT:  Head: Normocephalic and atraumatic.  Mouth/Throat: Oropharynx is clear and moist. No oropharyngeal exudate.  Eyes: Conjunctivae are normal. Right eye exhibits no discharge. Left eye exhibits no discharge. No scleral icterus.  Neck: Normal range of motion. Neck supple.  Cardiovascular: Normal rate, regular rhythm, normal heart sounds and intact distal pulses.   Pulmonary/Chest: Effort normal and breath sounds normal. No respiratory distress. No wheezes. No rales.  Abdominal: Soft. Bowel sounds are normal. Exhibits no distension and no mass. There is no tenderness.  Musculoskeletal: Normal range of motion. Exhibits no edema.  Lymphadenopathy:    No cervical adenopathy.  Neurological: Alert and oriented to person, place, and time. Exhibits normal muscle tone. Gait normal. Coordination normal.  Skin: Skin is warm and dry. No rash noted. Not diaphoretic. No erythema. No pallor.  Psychiatric: Mood, memory and judgment normal.  Vitals reviewed.  LABORATORY DATA: Lab Results  Component Value Date   WBC 4.1 02/20/2019   HGB 14.1 02/20/2019   HCT 44.2 02/20/2019   MCV 90.0 02/20/2019  PLT 279 02/20/2019      Chemistry      Component Value Date/Time   NA 142 02/20/2019 1230   K 3.4 (L) 02/20/2019 1230   CL 103 02/20/2019 1230   CO2 28 02/20/2019 1230   BUN 14 02/20/2019 1230   CREATININE 0.80 02/20/2019 1230      Component Value Date/Time   CALCIUM 8.9 02/20/2019 1230   ALKPHOS 105 02/20/2019 1230   AST 13 (L) 02/20/2019 1230   ALT 7 02/20/2019 1230   BILITOT 0.4 02/20/2019 1230       RADIOGRAPHIC STUDIES:  CT Chest W Contrast  Result Date: 02/21/2019 CLINICAL DATA:  Lung cancer. EXAM: CT CHEST WITH CONTRAST TECHNIQUE: Multidetector CT imaging of the chest was performed during intravenous contrast administration. CONTRAST:  36mL OMNIPAQUE IOHEXOL 300 MG/ML  SOLN COMPARISON:  09/02/2018 and 07/13/2017. FINDINGS: Cardiovascular: Atherosclerotic calcification  of the aorta and coronary arteries. Heart size normal. No pericardial effusion. Mediastinum/Nodes: Mediastinal lymph nodes measure up to 8 mm in the low right paratracheal station, similar. Right hilar lymph node measures 7 mm, stable. No left hilar or axillary adenopathy. Esophagus is grossly unremarkable. Lungs/Pleura: Biapical pleuroparenchymal scarring. Centrilobular emphysema. 7 mm right lower lobe nodule (5/64), stable. Image quality is degraded by respiratory motion. 2 mm peripheral left upper lobe nodule (5/60), new. Post treatment volume loss in the left lower lobe with trace associated left pleural fluid, similar. Airway is otherwise unremarkable. Upper Abdomen: Visualized portions of the liver, adrenal glands, kidneys, spleen, pancreas and stomach are grossly unremarkable. Small perihepatic ascites. Musculoskeletal: Degenerative changes in the spine. Vertebral body augmentations. T12 compression fracture is unchanged. IMPRESSION: 1. Post treatment scarring and volume loss in the left lower lobe with a small loculated left pleural effusion, stable. 2 mm left upper lobe nodule is new. Continued attention on follow-up exams is warranted. 2. 7 mm right lower lobe nodule, stable from 07/13/2017. 3. Aortic atherosclerosis (ICD10-I70.0). Coronary artery calcification. 4.  Emphysema (ICD10-J43.9). Electronically Signed   By: Lorin Picket M.D.   On: 02/21/2019 08:15     ASSESSMENT/PLAN:  This is a very pleasant 74 year old Caucasian female with stage IIIa non-small cell lung cancer, poorly differentiated squamous cell carcinoma. She presented with a large superior segment of theleft lower lobe mass with questionable left hilar adenopathy. She was diagnosed in August 2019.   The patient underwent a course of concurrent chemoradiation with carboplatin for an AUC of 2 and paclitaxel45 mg/m. Status post 7 cycles with a partial response.  The patient is status post 25 cycles of consolidation  immunotherapy with Imfinzi 10 mg/kg IV every 2 weeks.   She recently had a restaging CT scan performed. Dr. Julien Nordmann personally and independently reviewed the scan and discussed the results with the patient. The scan showed no evidence of disease progression.   Dr. Julien Nordmann recommends that she continue on observation with a repeat scan in 3 months.  We will see her back for a follow up visit in 3 months for evaluation and to review her scan.   We encouraged her to get the COVID-19 vaccine.   She inquired about recommended allergy medications. I advised her that she may take either claritin, allegra, or zyrtec.    The patient was advised to call immediately if she has any concerning symptoms in the interval. The patient voices understanding of current disease status and treatment options and is in agreement with the current care plan. All questions were answered. The patient knows to call the  clinic with any problems, questions or concerns. We can certainly see the patient much sooner if necessary   Orders Placed This Encounter  Procedures  . CT Chest W Contrast    Standing Status:   Future    Standing Expiration Date:   03/06/2020    Order Specific Question:   ** REASON FOR EXAM (FREE TEXT)    Answer:   Restaging Lung Cancer    Order Specific Question:   If indicated for the ordered procedure, I authorize the administration of contrast media per Radiology protocol    Answer:   Yes    Order Specific Question:   Preferred imaging location?    Answer:   [facility]    Order Specific Question:   Radiology Contrast Protocol - do NOT remove file path    Answer:   [email or web address]  . CBC with Differential ([place] Only)    Standing Status:   Future    Standing Expiration Date:   03/06/2020  . CMP ([place] only)    Standing Status:   Future    Standing Expiration Date:   03/06/2020     Tobe Sos Donyale Berthold,  PA-C 03/07/19 ADDENDUM: Hematology/Oncology Attending: I had a face-to-face encounter with the patient.  I recommended her care plan.  This is a very pleasant 74 years old white female with history of stage IIIa non-small cell lung cancer, squamous cell carcinoma status post a course of concurrent chemoradiation with partial response followed by consolidation treatment with immunotherapy for 25 cycles.  The patient tolerated her treatment well with no concerning complaints. She had repeat CT scan of the chest performed recently for restaging of her disease. The scan showed no concerning findings for disease progression. I recommended for the patient to continue on observation with repeat CT scan of the chest in 3 months. For hypertension she was advised to monitor her blood pressure closely.  She was also advised to increase her oral intake for the weight loss. The patient was advised to call immediately if she has any concerning symptoms in the interval.  Disclaimer: This note was dictated with voice recognition software. Similar sounding words can inadvertently be transcribed and may be missed upon review. Eilleen Kempf, MD 03/09/19

## 2019-03-07 ENCOUNTER — Other Ambulatory Visit: Payer: Self-pay

## 2019-03-07 ENCOUNTER — Inpatient Hospital Stay (HOSPITAL_BASED_OUTPATIENT_CLINIC_OR_DEPARTMENT_OTHER): Payer: Medicare Other | Admitting: Physician Assistant

## 2019-03-07 ENCOUNTER — Ambulatory Visit: Payer: Medicare Other | Admitting: Physician Assistant

## 2019-03-07 VITALS — BP 151/88 | HR 99 | Temp 98.2°F | Resp 18 | Ht 61.0 in | Wt <= 1120 oz

## 2019-03-07 DIAGNOSIS — C3432 Malignant neoplasm of lower lobe, left bronchus or lung: Secondary | ICD-10-CM | POA: Diagnosis not present

## 2019-03-07 DIAGNOSIS — C3492 Malignant neoplasm of unspecified part of left bronchus or lung: Secondary | ICD-10-CM

## 2019-03-07 MED ORDER — PREDNISONE 50 MG PO TABS: ORAL_TABLET | ORAL | 0 refills | Status: DC

## 2019-03-09 ENCOUNTER — Encounter: Payer: Self-pay | Admitting: Physician Assistant

## 2019-03-11 ENCOUNTER — Telehealth: Payer: Self-pay | Admitting: Internal Medicine

## 2019-03-11 NOTE — Telephone Encounter (Signed)
Scheduled per los. Called and left msg. Mailed printout  °

## 2019-05-30 ENCOUNTER — Telehealth: Payer: Self-pay | Admitting: Physician Assistant

## 2019-05-30 NOTE — Telephone Encounter (Signed)
Called the patient to remind her to take her prednisone 13 hours, 7 hours, and 2 hours before her scan as well as to take 25 mg of benadryl 2 hours before. Let her know the times of her labs, scan, and follow up appointments. She expressed understanding.

## 2019-06-02 ENCOUNTER — Telehealth: Payer: Self-pay | Admitting: Internal Medicine

## 2019-06-02 NOTE — Telephone Encounter (Signed)
Called pt to confirm this weeks appts = pt is aware.

## 2019-06-03 NOTE — Progress Notes (Signed)
[facility] OFFICE PROGRESS NOTE  Sherry Harvey, NP [place] [address]  DIAGNOSIS: Stage IIb/IIIa (T3, N0/N1, M0)non-small cell lung cancer poorly differentiated squamous cell carcinoma diagnosed in August 2019. The patient presented with large superior segment leftlower lobe mass with questionable left hilar adenopathy  PRIOR THERAPY:  1) Concurrent chemoradiation with weekly carboplatin for AUC of 2 and paclitaxel 45 mg/M2. Status post 7 cycles. Last dose was given October 29, 2017 with partial response 2) Consolidation treatment with immunotherapy with Imfinzi 10 mg/KG every 2 weeks. Status post25cycles. Last dose 12/12/2018  CURRENT THERAPY: Observation  INTERVAL HISTORY: Sherry [name] 74 y.o. female returns to the clinic today for a follow-up visit.  The patient is feeling well today without any concerning complaints except she has been having some family problems recently which has been causing panic attacks and anxiety. She also notes seasonal allergies. The patient completed 25 cycles of consolidation immunotherapy in December 2020.  She has been on observation since that time and she is feeling well.  Today she denies any recent fever, chills, or night sweats. She lost weight since her last visit. She states that she has been "eating like a pig" and is unsure why she is losing weight. She attributes it to sleeping a lot and stress from her family concerns.  She denies any chest pain or hemoptysis. She reports her baseline dyspnea on exertion and dry cough. She also has wheezing due to her COPD. She is prescribed inhalers. She states she has been "puffing a cigarettes or two" due to her recent stressors. She denies any nausea, vomiting, diarrhea, or constipation.  She denies any unusual headache or visual changes.  The patient recently had a restaging CT scan performed.  She is here today for evaluation and to review her scan  results.  MEDICAL HISTORY: Past Medical History:  Diagnosis Date  . Allergy   . Anxiety   . Arthritis   . Cavitating mass in left lower lung lobe   . Complication of anesthesia    woke up during procedure  . COPD (chronic obstructive pulmonary disease) ([place])   . Depression   . Family history of adverse reaction to anesthesia    " my mother was always hard to wake up"  . Full dentures   . Headache   . Neuromuscular disorder ([place])   . Pneumonia   . Thoracic spine fracture ([place]) 11/01/2017   T9  . Wears glasses     ALLERGIES:  is allergic to omnipaque [iohexol] and penicillins.  MEDICATIONS:  Current Outpatient Medications  Medication Sig Dispense Refill  . acetaminophen (TYLENOL 8 HOUR) 650 MG CR tablet Take 1 tablet (650 mg total) by mouth every 8 (eight) hours as needed for pain or fever. 30 tablet 0  . albuterol (VENTOLIN HFA) 108 (90 Base) MCG/ACT inhaler INHALE 2 PUFFS INTO THE LUNGS EVERY 6 HOURS AS NEEDED FOR WHEEZING OR SHORTNESS OF BREATH 25.5 g 1  . alendronate (FOSAMAX) 70 MG tablet Take 70 mg by mouth once a week.    . gabapentin (NEURONTIN) 100 MG capsule Take 1 capsule (100 mg total) by mouth 3 (three) times daily. Take 1 tablet once a day 90 capsule 2  . guaifenesin (ROBITUSSIN) 100 MG/5ML syrup Take 200 mg by mouth 3 (three) times daily as needed for cough.     Marland Kitchen HYDROcodone-acetaminophen (NORCO/VICODIN) 5-325 MG tablet TK 1-2 TS PO Q 6 H PRF MODERATE PAIN    .  Menthol-Ascorbic Acid (LUDENS COUGH DROPS MT) Use as directed 1 lozenge in the mouth or throat 3 (three) times daily as needed (cough).     . Multiple Vitamin (MULTIVITAMIN WITH MINERALS) TABS tablet Take 1 tablet by mouth daily.    . predniSONE (DELTASONE) 50 MG tablet Take 1 (50 mg) tablet 13 hours, 7 hours, and 2 hours before your CT scan 3 tablet 0  . prochlorperazine (COMPAZINE) 10 MG tablet Take 1 tablet (10 mg total) by mouth every 6 (six) hours as needed for nausea or vomiting. 30 tablet 0   No  current facility-administered medications for this visit.    SURGICAL HISTORY:  Past Surgical History:  Procedure Laterality Date  . ELBOW SURGERY     for  "Tennis Elbow"  . hand surgery    . IR KYPHO THORACIC WITH BONE BIOPSY  11/27/2017  . IR KYPHO THORACIC WITH BONE BIOPSY  03/01/2018  . IR KYPHO THORACIC WITH BONE BIOPSY  03/26/2018  . IR RADIOLOGIST EVAL & MGMT  11/20/2017  . IR RADIOLOGIST EVAL & MGMT  02/26/2018  . IR RADIOLOGIST EVAL & MGMT  03/13/2018  . MULTIPLE TOOTH EXTRACTIONS    . TUBAL LIGATION    . VIDEO BRONCHOSCOPY N/A 08/31/2017   Procedure: VIDEO BRONCHOSCOPY;  Surgeon: Melrose Nakayama, MD;  Location: [facility] OR;  Service: Thoracic;  Laterality: N/A;    REVIEW OF SYSTEMS:   Review of Systems  Constitutional: Positive for weight loss. Negative for appetite change, chills, fatigue, and fever.  HENT: Positive for seasonal allergies. Negative for mouth sores, nosebleeds, sore throat and trouble swallowing.   Eyes: Negative for eye problems and icterus.  Respiratory: Positive for baseline dyspnea on exertion, occasional wheezing, and dry cough. Negative for cough, hemoptysis, shortness of breath and wheezing.   Cardiovascular: Negative for chest pain and leg swelling.  Gastrointestinal: Negative for abdominal pain, constipation, diarrhea, nausea and vomiting.  Genitourinary: Negative for bladder incontinence, difficulty urinating, dysuria, frequency and hematuria.   Musculoskeletal: Negative for back pain, gait problem, neck pain and neck stiffness.  Skin: Negative for itching and rash.  Neurological: Negative for dizziness, extremity weakness, gait problem, headaches, light-headedness and seizures.  Hematological: Negative for adenopathy. Does not bruise/bleed easily.  Psychiatric/Behavioral: Negative for confusion, depression and sleep disturbance. The patient is not nervous/anxious.     PHYSICAL EXAMINATION:  Blood pressure (!) 148/86, pulse 100, temperature 97.7 F  (36.5 C), temperature source Temporal, resp. rate 18, height 5\' 1"  (1.549 m), weight 67 lb 6.4 oz (30.6 kg), SpO2 99 %.  ECOG PERFORMANCE STATUS: 1 - Symptomatic but completely ambulatory  Physical Exam  Constitutional: Oriented to person, place, and time and cachetic appearing female and in no acute distress.  HENT:  Head: Normocephalic and atraumatic.  Mouth/Throat: Oropharynx is clear and moist. No oropharyngeal exudate.  Eyes: Conjunctivae are normal. Right eye exhibits no discharge. Left eye exhibits no discharge. No scleral icterus.  Neck: Normal range of motion. Neck supple.  Cardiovascular: Normal rate, regular rhythm, normal heart sounds and intact distal pulses.   Pulmonary/Chest: Effort normal. Wheezing in all lung fields.  No respiratory distress. No rales.  Abdominal: Soft. Bowel sounds are normal. Exhibits no distension and no mass. There is no tenderness.  Musculoskeletal: Normal range of motion. Exhibits no edema.  Lymphadenopathy:    No cervical adenopathy.  Neurological: Alert and oriented to person, place, and time. Exhibits normal muscle tone. Gait normal. Coordination normal.  Skin: Skin is warm and dry. No rash  noted. Not diaphoretic. No erythema. No pallor.  Psychiatric: Mood, memory and judgment normal.  Vitals reviewed.  LABORATORY DATA: Lab Results  Component Value Date   WBC 3.4 (L) 06/04/2019   HGB 13.6 06/04/2019   HCT 43.7 06/04/2019   MCV 93.8 06/04/2019   PLT 274 06/04/2019      Chemistry      Component Value Date/Time   NA 143 06/04/2019 1044   K 4.0 06/04/2019 1044   CL 104 06/04/2019 1044   CO2 30 06/04/2019 1044   BUN 16 06/04/2019 1044   CREATININE 0.83 06/04/2019 1044      Component Value Date/Time   CALCIUM 9.2 06/04/2019 1044   ALKPHOS 90 06/04/2019 1044   AST 17 06/04/2019 1044   ALT 8 06/04/2019 1044   BILITOT 0.4 06/04/2019 1044       RADIOGRAPHIC STUDIES:  CT Chest W Contrast  Result Date: 06/04/2019 CLINICAL DATA:   Left lung stage III squamous cell carcinoma. Restaging. EXAM: CT CHEST WITH CONTRAST TECHNIQUE: Multidetector CT imaging of the chest was performed during intravenous contrast administration. CONTRAST:  3mL OMNIPAQUE IOHEXOL 300 MG/ML  SOLN COMPARISON:  02/20/2019 chest CT. FINDINGS: Cardiovascular: Normal heart size. No significant pericardial effusion/thickening. Left anterior descending coronary atherosclerosis. Atherosclerotic nonaneurysmal thoracic aorta. Normal caliber pulmonary arteries. No central pulmonary emboli. Mediastinum/Nodes: Sub 5 mm hypodense bilateral thyroid nodules are stable. Not clinically significant; no follow-up imaging recommended (ref: J Am Coll Radiol. 2015 Feb;12(2): 143-50). Unremarkable esophagus. No pathologically enlarged axillary, mediastinal or hilar lymph nodes. Lungs/Pleura: No pneumothorax. No pleural effusion. Severe centrilobular emphysema with diffuse bronchial wall thickening. Sharply marginated low left perihilar consolidation with associated bronchiectasis, volume loss and distortion, unchanged, compatible with radiation fibrosis. No acute consolidative airspace disease. Two scattered solid right pulmonary nodules, largest 7 mm in the superior segment right lower lobe (series 5/image 71), both stable. No new significant pulmonary nodules. Previously described 2 mm left upper lobe pulmonary nodule is absent on today's scan. Upper abdomen: No acute abnormality. Musculoskeletal: No aggressive appearing focal osseous lesions. Stable vertebroplasty changes in the mild T9, T10 and T11 vertebral compression fractures. Chronic mild T12 vertebral compression fracture. IMPRESSION: 1. Stable left perihilar radiation fibrosis with no findings of local tumor recurrence. 2. No evidence of metastatic disease in the chest. Small right pulmonary nodules are stable. 3. Aortic Atherosclerosis (ICD10-I70.0) and Emphysema (ICD10-J43.9). Electronically Signed   By: Ilona Sorrel M.D.   On:  06/04/2019 14:22     ASSESSMENT/PLAN:  This is a very pleasant 74 year old Caucasian female with stage IIIa non-small cell lung cancer, poorly differentiated squamous cell carcinoma. She presented with a large superior segment of theleft lower lobe mass with questionable left hilar adenopathy. She was diagnosed in August 2019.   The patient underwent a course of concurrent chemoradiation with carboplatin for an AUC of 2 and paclitaxel45 mg/m. Status post 7 cycles with a partial response.  The patient is status post 25 cycles of consolidation immunotherapy with Imfinzi 10 mg/kg IV every 2 weeks.   She recently had a restaging CT scan performed. Dr. Julien Nordmann personally and independently reviewed the scan and discussed the results with the patient. The scan showed no evidence of disease progression.   Dr. Julien Nordmann recommends that she continue on observation with a repeat scan in 4 months.  We will see her back for a follow up visit in 4 months for evaluation and to review her scan.  The patient does not want her prescription for the pre-medication  sent at this time due to the concern that she will lose it. I discussed with her to call the clinic as her scan gets closer to send the prednisone premedication due to her contrast allergy.   She was advised to use claritin or zyrtec for her allergies.   The patient was advised to call immediately if she has any concerning symptoms in the interval. The patient voices understanding of current disease status and treatment options and is in agreement with the current care plan. All questions were answered. The patient knows to call the clinic with any problems, questions or concerns. We can certainly see the patient much sooner if necessary         Orders Placed This Encounter  Procedures  . CT Chest W Contrast    Standing Status:   Future    Standing Expiration Date:   06/04/2020    Order Specific Question:   ** REASON FOR EXAM (FREE  TEXT)    Answer:   Restaging hx of stage III lung cancer    Order Specific Question:   If indicated for the ordered procedure, I authorize the administration of contrast media per Radiology protocol    Answer:   Yes    Order Specific Question:   Preferred imaging location?    Answer:   [facility]    Order Specific Question:   Radiology Contrast Protocol - do NOT remove file path    Answer:   [email or web address]  . CBC with Differential ([place] Only)    Standing Status:   Future    Standing Expiration Date:   06/04/2020  . CMP ([place] only)    Standing Status:   Future    Standing Expiration Date:   06/04/2020     Tobe Sos Cav[facility] Nicolls, PA-C 06/05/19   ADDENDUM: Hematology/Oncology Attending: I had a face-to-face encounter with the patient today.  I recommended her care plan.  This is a very pleasant 74 years old white female with a stage IIIa non-small cell lung cancer, poorly differentiated squamous cell carcinoma status post a course of concurrent chemoradiation with weekly carboplatin and paclitaxel with partial response.  The patient also completed 25 cycle of consolidation treatment with immunotherapy with Imfinzi.  She tolerated her treatment well.  She is currently on observation and feeling fine. She had repeat CT scan of the chest performed recently.  I personally and independently reviewed the scans and discussed the results with the patient today. Her scan showed no concerning findings for disease progression or metastasis. I recommended for the patient to continue on observation with repeat CT scan of the chest in 4 months. She was advised to call immediately if she has any concerning symptoms in the interval.  Disclaimer: This note was dictated with voice recognition software. Similar sounding words can inadvertently be transcribed and may be missed upon review. Eilleen Kempf, MD 06/05/19

## 2019-06-04 ENCOUNTER — Ambulatory Visit (HOSPITAL_COMMUNITY)
Admission: RE | Admit: 2019-06-04 | Discharge: 2019-06-04 | Disposition: A | Discharge status: Home / Self Care | Payer: Medicare Other | Source: Ambulatory Visit | Attending: Physician Assistant | Admitting: Physician Assistant

## 2019-06-04 ENCOUNTER — Inpatient Hospital Stay: Payer: Medicare Other | Attending: Internal Medicine

## 2019-06-04 ENCOUNTER — Other Ambulatory Visit: Payer: Self-pay

## 2019-06-04 DIAGNOSIS — C3492 Malignant neoplasm of unspecified part of left bronchus or lung: Secondary | ICD-10-CM

## 2019-06-04 DIAGNOSIS — Z923 Personal history of irradiation: Secondary | ICD-10-CM | POA: Insufficient documentation

## 2019-06-04 DIAGNOSIS — Z85118 Personal history of other malignant neoplasm of bronchus and lung: Secondary | ICD-10-CM | POA: Diagnosis not present

## 2019-06-04 DIAGNOSIS — Z9221 Personal history of antineoplastic chemotherapy: Secondary | ICD-10-CM | POA: Diagnosis not present

## 2019-06-04 LAB — CBC WITH DIFFERENTIAL (CANCER CENTER ONLY)
Abs Immature Granulocytes: 0.01 10*3/uL (ref 0.00–0.07)
Basophils Absolute: 0 10*3/uL (ref 0.0–0.1)
Basophils Relative: 0 %
Eosinophils Absolute: 0 10*3/uL (ref 0.0–0.5)
Eosinophils Relative: 0 %
HCT: 43.7 % (ref 36.0–46.0)
Hemoglobin: 13.6 g/dL (ref 12.0–15.0)
Immature Granulocytes: 0 %
Lymphocytes Relative: 13 %
Lymphs Abs: 0.4 10*3/uL — ABNORMAL LOW (ref 0.7–4.0)
MCH: 29.2 pg (ref 26.0–34.0)
MCHC: 31.1 g/dL (ref 30.0–36.0)
MCV: 93.8 fL (ref 80.0–100.0)
Monocytes Absolute: 0.1 10*3/uL (ref 0.1–1.0)
Monocytes Relative: 2 %
Neutro Abs: 2.9 10*3/uL (ref 1.7–7.7)
Neutrophils Relative %: 85 %
Platelet Count: 274 10*3/uL (ref 150–400)
RBC: 4.66 MIL/uL (ref 3.87–5.11)
RDW: 15.2 % (ref 11.5–15.5)
WBC Count: 3.4 10*3/uL — ABNORMAL LOW (ref 4.0–10.5)
nRBC: 0 % (ref 0.0–0.2)

## 2019-06-04 LAB — CMP (CANCER CENTER ONLY)
ALT: 8 U/L (ref 0–44)
AST: 17 U/L (ref 15–41)
Albumin: 3.9 g/dL (ref 3.5–5.0)
Alkaline Phosphatase: 90 U/L (ref 38–126)
Anion gap: 9 (ref 5–15)
BUN: 16 mg/dL (ref 8–23)
CO2: 30 mmol/L (ref 22–32)
Calcium: 9.2 mg/dL (ref 8.9–10.3)
Chloride: 104 mmol/L (ref 98–111)
Creatinine: 0.83 mg/dL (ref 0.44–1.00)
GFR, Est AFR Am: >60 mL/min (ref >60)
GFR, Estimated: >60 mL/min (ref >60)
Glucose, Bld: 164 mg/dL — ABNORMAL HIGH (ref 70–99)
Potassium: 4 mmol/L (ref 3.5–5.1)
Sodium: 143 mmol/L (ref 135–145)
Total Bilirubin: 0.4 mg/dL (ref 0.3–1.2)
Total Protein: 7.6 g/dL (ref 6.5–8.1)

## 2019-06-04 MED ORDER — IOHEXOL 300 MG/ML  SOLN
75.0000 mL | Freq: Once | INTRAMUSCULAR | Status: AC | PRN
  Administered 2019-06-04: 75 mL via INTRAVENOUS

## 2019-06-04 MED ORDER — SODIUM CHLORIDE (PF) 0.9 % IJ SOLN
INTRAMUSCULAR | Status: AC
  Filled 2019-06-04: qty 50

## 2019-06-05 ENCOUNTER — Inpatient Hospital Stay (HOSPITAL_BASED_OUTPATIENT_CLINIC_OR_DEPARTMENT_OTHER): Payer: Medicare Other | Admitting: Physician Assistant

## 2019-06-05 ENCOUNTER — Encounter: Payer: Self-pay | Admitting: Physician Assistant

## 2019-06-05 VITALS — BP 148/86 | HR 100 | Temp 97.7°F | Resp 18 | Ht 61.0 in | Wt <= 1120 oz

## 2019-06-05 DIAGNOSIS — Z85118 Personal history of other malignant neoplasm of bronchus and lung: Secondary | ICD-10-CM | POA: Diagnosis not present

## 2019-06-05 DIAGNOSIS — C3492 Malignant neoplasm of unspecified part of left bronchus or lung: Secondary | ICD-10-CM | POA: Diagnosis not present

## 2019-06-06 ENCOUNTER — Ambulatory Visit: Payer: Medicare Other | Admitting: Physician Assistant

## 2019-09-18 ENCOUNTER — Other Ambulatory Visit: Payer: Self-pay | Admitting: Medical Oncology

## 2019-09-18 DIAGNOSIS — T508X5A Adverse effect of diagnostic agents, initial encounter: Secondary | ICD-10-CM

## 2019-09-18 DIAGNOSIS — Z91041 Radiographic dye allergy status: Secondary | ICD-10-CM

## 2019-09-18 DIAGNOSIS — C3492 Malignant neoplasm of unspecified part of left bronchus or lung: Secondary | ICD-10-CM

## 2019-09-18 MED ORDER — DIPHENHYDRAMINE HCL 50 MG PO TABS: 50.0000 mg | ORAL_TABLET | ORAL | 0 refills | Status: DC

## 2019-09-18 MED ORDER — PREDNISONE 50 MG PO TABS: ORAL_TABLET | ORAL | 3 refills | Status: DC

## 2019-10-01 ENCOUNTER — Ambulatory Visit (HOSPITAL_COMMUNITY)
Admission: RE | Admit: 2019-10-01 | Discharge: 2019-10-01 | Disposition: A | Discharge status: Home / Self Care | Payer: Medicare Other | Source: Ambulatory Visit | Attending: Physician Assistant | Admitting: Physician Assistant

## 2019-10-01 ENCOUNTER — Inpatient Hospital Stay: Payer: Medicare Other | Attending: Internal Medicine

## 2019-10-01 ENCOUNTER — Other Ambulatory Visit: Payer: Self-pay

## 2019-10-01 DIAGNOSIS — C3432 Malignant neoplasm of lower lobe, left bronchus or lung: Secondary | ICD-10-CM | POA: Insufficient documentation

## 2019-10-01 DIAGNOSIS — G8929 Other chronic pain: Secondary | ICD-10-CM | POA: Insufficient documentation

## 2019-10-01 DIAGNOSIS — Z79899 Other long term (current) drug therapy: Secondary | ICD-10-CM | POA: Diagnosis not present

## 2019-10-01 DIAGNOSIS — C3492 Malignant neoplasm of unspecified part of left bronchus or lung: Secondary | ICD-10-CM | POA: Insufficient documentation

## 2019-10-01 DIAGNOSIS — J449 Chronic obstructive pulmonary disease, unspecified: Secondary | ICD-10-CM | POA: Diagnosis not present

## 2019-10-01 DIAGNOSIS — Z923 Personal history of irradiation: Secondary | ICD-10-CM | POA: Diagnosis not present

## 2019-10-01 DIAGNOSIS — M549 Dorsalgia, unspecified: Secondary | ICD-10-CM | POA: Diagnosis not present

## 2019-10-01 DIAGNOSIS — Z9221 Personal history of antineoplastic chemotherapy: Secondary | ICD-10-CM | POA: Diagnosis not present

## 2019-10-01 LAB — CBC WITH DIFFERENTIAL (CANCER CENTER ONLY)
Abs Immature Granulocytes: 0.03 10*3/uL (ref 0.00–0.07)
Basophils Absolute: 0 10*3/uL (ref 0.0–0.1)
Basophils Relative: 0 %
Eosinophils Absolute: 0 10*3/uL (ref 0.0–0.5)
Eosinophils Relative: 0 %
HCT: 41.9 % (ref 36.0–46.0)
Hemoglobin: 13.3 g/dL (ref 12.0–15.0)
Immature Granulocytes: 1 %
Lymphocytes Relative: 8 %
Lymphs Abs: 0.6 10*3/uL — ABNORMAL LOW (ref 0.7–4.0)
MCH: 29.8 pg (ref 26.0–34.0)
MCHC: 31.7 g/dL (ref 30.0–36.0)
MCV: 93.9 fL (ref 80.0–100.0)
Monocytes Absolute: 0.2 10*3/uL (ref 0.1–1.0)
Monocytes Relative: 2 %
Neutro Abs: 5.9 10*3/uL (ref 1.7–7.7)
Neutrophils Relative %: 89 %
Platelet Count: 276 10*3/uL (ref 150–400)
RBC: 4.46 MIL/uL (ref 3.87–5.11)
RDW: 13.8 % (ref 11.5–15.5)
WBC Count: 6.6 10*3/uL (ref 4.0–10.5)
nRBC: 0 % (ref 0.0–0.2)

## 2019-10-01 LAB — CMP (CANCER CENTER ONLY)
ALT: 8 U/L (ref 0–44)
AST: 15 U/L (ref 15–41)
Albumin: 3.8 g/dL (ref 3.5–5.0)
Alkaline Phosphatase: 96 U/L (ref 38–126)
Anion gap: 6 (ref 5–15)
BUN: 16 mg/dL (ref 8–23)
CO2: 31 mmol/L (ref 22–32)
Calcium: 9.4 mg/dL (ref 8.9–10.3)
Chloride: 103 mmol/L (ref 98–111)
Creatinine: 0.77 mg/dL (ref 0.44–1.00)
GFR, Est AFR Am: >60 mL/min (ref >60)
GFR, Estimated: >60 mL/min (ref >60)
Glucose, Bld: 111 mg/dL — ABNORMAL HIGH (ref 70–99)
Potassium: 4.4 mmol/L (ref 3.5–5.1)
Sodium: 140 mmol/L (ref 135–145)
Total Bilirubin: 0.4 mg/dL (ref 0.3–1.2)
Total Protein: 7.5 g/dL (ref 6.5–8.1)

## 2019-10-01 MED ORDER — IOHEXOL 300 MG/ML  SOLN
75.0000 mL | Freq: Once | INTRAMUSCULAR | Status: AC | PRN
  Administered 2019-10-01: 75 mL via INTRAVENOUS

## 2019-10-03 ENCOUNTER — Other Ambulatory Visit: Payer: Medicare Other

## 2019-10-06 ENCOUNTER — Encounter: Payer: Self-pay | Admitting: Internal Medicine

## 2019-10-06 ENCOUNTER — Other Ambulatory Visit: Payer: Self-pay

## 2019-10-06 ENCOUNTER — Inpatient Hospital Stay (HOSPITAL_BASED_OUTPATIENT_CLINIC_OR_DEPARTMENT_OTHER): Payer: Medicare Other | Admitting: Internal Medicine

## 2019-10-06 VITALS — BP 124/79 | HR 87 | Temp 97.1°F | Resp 16 | Ht 61.0 in | Wt <= 1120 oz

## 2019-10-06 DIAGNOSIS — C3432 Malignant neoplasm of lower lobe, left bronchus or lung: Secondary | ICD-10-CM | POA: Diagnosis not present

## 2019-10-06 DIAGNOSIS — C3490 Malignant neoplasm of unspecified part of unspecified bronchus or lung: Secondary | ICD-10-CM | POA: Diagnosis not present

## 2019-10-06 DIAGNOSIS — C3492 Malignant neoplasm of unspecified part of left bronchus or lung: Secondary | ICD-10-CM | POA: Diagnosis not present

## 2019-10-06 NOTE — Progress Notes (Signed)
[facility] [phone]   Fax:([phone]) [phone]  OFFICE PROGRESS NOTE  Sherry Harvey, NP [place] [address]  DIAGNOSIS: Stage IIb/IIIa (T3, N0/N1, M0) non-small cell lung cancer poorly differentiated squamous cell carcinoma diagnosed in August 2019.  The patient presented with large superior segment left lower lobe mass with questionable left hilar adenopathy.  PRIOR THERAPY: 1) Concurrent chemoradiation with weekly carboplatin for AUC of 2 and paclitaxel 45 mg/M2. Status post 7 cycles. Last dose was given October 29, 2017 with partial response 2) Consolidation treatment with immunotherapy with Imfinzi 10 mg/KG every 2 weeks. Status post25cycles. Last dose 12/12/2019  CURRENT THERAPY: Observation  INTERVAL HISTORY: HEAVIN [name] 74 y.o. female returns to the clinic today for follow-up visit.  The patient is feeling fine today with no concerning complaints.  She denied having any chest pain, shortness of breath, cough or hemoptysis.  She denied having any nausea, vomiting, diarrhea or constipation.  She has no headache or visual changes.  She has no recent weight loss or night sweats.  She continues to have the chronic back pain and she takes hydrocodone as needed.  The patient had repeat CT scan of the chest performed recently and she is here for evaluation and discussion of her risk her results.   MEDICAL HISTORY: Past Medical History:  Diagnosis Date  . Allergy   . Anxiety   . Arthritis   . Cavitating mass in left lower lung lobe   . Complication of anesthesia    woke up during procedure  . COPD (chronic obstructive pulmonary disease) ([place])   . Depression   . Family history of adverse reaction to anesthesia    " my mother was always hard to wake up"  . Full dentures   . Headache   . Neuromuscular disorder ([place])   . Pneumonia   . Thoracic spine fracture ([place]) 11/01/2017   T9  . Wears glasses     ALLERGIES:   is allergic to omnipaque [iohexol] and penicillins.  MEDICATIONS:  Current Outpatient Medications  Medication Sig Dispense Refill  . acetaminophen (TYLENOL 8 HOUR) 650 MG CR tablet Take 1 tablet (650 mg total) by mouth every 8 (eight) hours as needed for pain or fever. 30 tablet 0  . albuterol (VENTOLIN HFA) 108 (90 Base) MCG/ACT inhaler INHALE 2 PUFFS INTO THE LUNGS EVERY 6 HOURS AS NEEDED FOR WHEEZING OR SHORTNESS OF BREATH 25.5 g 1  . alendronate (FOSAMAX) 70 MG tablet Take 70 mg by mouth once a week.    . diphenhydrAMINE (BENADRYL) 50 MG tablet Take 1 tablet (50 mg total) by mouth as directed. Take one tablet po one hour prior to scan 30 tablet 0  . gabapentin (NEURONTIN) 100 MG capsule Take 1 capsule (100 mg total) by mouth 3 (three) times daily. Take 1 tablet once a day 90 capsule 2  . guaifenesin (ROBITUSSIN) 100 MG/5ML syrup Take 200 mg by mouth 3 (three) times daily as needed for cough.     Marland Kitchen HYDROcodone-acetaminophen (NORCO/VICODIN) 5-325 MG tablet TK 1-2 TS PO Q 6 H PRF MODERATE PAIN    . Menthol-Ascorbic Acid (LUDENS COUGH DROPS MT) Use as directed 1 lozenge in the mouth or throat 3 (three) times daily as needed (cough).     . Multiple Vitamin (MULTIVITAMIN WITH MINERALS) TABS tablet Take 1 tablet by mouth daily.    . predniSONE (DELTASONE) 50 MG tablet Take 1 (50 mg)  tablet 13 hours, 7 hours, and one hour before your CT scan 3 tablet 3  . prochlorperazine (COMPAZINE) 10 MG tablet Take 1 tablet (10 mg total) by mouth every 6 (six) hours as needed for nausea or vomiting. 30 tablet 0   No current facility-administered medications for this visit.    SURGICAL HISTORY:  Past Surgical History:  Procedure Laterality Date  . ELBOW SURGERY     for  "Tennis Elbow"  . hand surgery    . IR KYPHO THORACIC WITH BONE BIOPSY  11/27/2017  . IR KYPHO THORACIC WITH BONE BIOPSY  03/01/2018  . IR KYPHO THORACIC WITH BONE BIOPSY  03/26/2018  . IR RADIOLOGIST EVAL & MGMT  11/20/2017  . IR  RADIOLOGIST EVAL & MGMT  02/26/2018  . IR RADIOLOGIST EVAL & MGMT  03/13/2018  . MULTIPLE TOOTH EXTRACTIONS    . TUBAL LIGATION    . VIDEO BRONCHOSCOPY N/A 08/31/2017   Procedure: VIDEO BRONCHOSCOPY;  Surgeon: Melrose Nakayama, MD;  Location[facility] OR;  Service: Thoracic;  Laterality: N/A;    REVIEW OF SYSTEMS:  A comprehensive review of systems was negative except for: Musculoskeletal: positive for back pain   PHYSICAL EXAMINATION: General appearance: alert, cooperative and no distress Head: Normocephalic, without obvious abnormality, atraumatic Neck: no adenopathy, no JVD, supple, symmetrical, trachea midline and thyroid not enlarged, symmetric, no tenderness/mass/nodules Lymph nodes: Cervical, supraclavicular, and axillary nodes normal. Resp: clear to auscultation bilaterally Back: symmetric, no curvature. ROM normal. No CVA tenderness. Cardio: regular rate and rhythm, S1, S2 normal, no murmur, click, rub or gallop GI: soft, non-tender; bowel sounds normal; no masses,  no organomegaly Extremities: extremities normal, atraumatic, no cyanosis or edema  ECOG PERFORMANCE STATUS: 1 - Symptomatic but completely ambulatory  Blood pressure 124/79, pulse 87, temperature (!) 97.1 F (36.2 C), temperature source Tympanic, resp. rate 16, height 5\' 1"  (1.549 m), weight 68 lb 1.6 oz (30.9 kg), SpO2 97 %.  LABORATORY DATA: Lab Results  Component Value Date   WBC 6.6 10/01/2019   HGB 13.3 10/01/2019   HCT 41.9 10/01/2019   MCV 93.9 10/01/2019   PLT 276 10/01/2019      Chemistry      Component Value Date/Time   NA 140 10/01/2019 1326   K 4.4 10/01/2019 1326   CL 103 10/01/2019 1326   CO2 31 10/01/2019 1326   BUN 16 10/01/2019 1326   CREATININE 0.77 10/01/2019 1326      Component Value Date/Time   CALCIUM 9.4 10/01/2019 1326   ALKPHOS 96 10/01/2019 1326   AST 15 10/01/2019 1326   ALT 8 10/01/2019 1326   BILITOT 0.4 10/01/2019 1326       RADIOGRAPHIC STUDIES: CT Chest W  Contrast  Result Date: 10/01/2019 CLINICAL DATA:  Stage III left lower lobe lung cancer treated with chemoradiation therapy with ongoing immunotherapy. Restaging. EXAM: CT CHEST WITH CONTRAST TECHNIQUE: Multidetector CT imaging of the chest was performed during intravenous contrast administration. CONTRAST:  33mL OMNIPAQUE IOHEXOL 300 MG/ML  SOLN COMPARISON:  06/29/2019 chest CT. FINDINGS: Cardiovascular: Normal heart size. No significant pericardial effusion/thickening. Left anterior descending coronary atherosclerosis. Atherosclerotic nonaneurysmal thoracic aorta. Normal caliber pulmonary arteries. No central pulmonary emboli. Mediastinum/Nodes: Sub 5 mm hypodense bilateral thyroid nodules are stable. Unremarkable esophagus. No pathologically enlarged axillary, mediastinal or hilar lymph nodes. Lungs/Pleura: No pneumothorax. Trace loculated basilar left pleural effusion is stable. No right pleural effusion. Severe centrilobular emphysema with diffuse bronchial wall thickening. Sharply marginated lower left parahilar consolidation with associated  volume loss, mild bronchiectasis and distortion, unchanged, compatible with radiation fibrosis. Solid 7 mm right lower lobe pulmonary nodule (series 7/image 61), stable. Anterior right upper lobe 2 mm solid pulmonary nodule (series 7/image 68), stable. No acute consolidative airspace disease, lung masses or new significant pulmonary nodules. Upper abdomen: No acute abnormality. Musculoskeletal: No aggressive appearing focal osseous lesions. T9, T10 and T11 chronic mild-to-moderate vertebral compression fracture status post vertebroplasty, unchanged. Mild thoracic spondylosis. IMPRESSION: 1. Stable radiation fibrosis in the perihilar lower left lung with no evidence of local tumor recurrence. 2. No evidence of metastatic disease in the chest. 3. Aortic Atherosclerosis (ICD10-I70.0) and Emphysema (ICD10-J43.9). Electronically Signed   By: Ilona Sorrel M.D.   On: 10/01/2019  15:20    ASSESSMENT AND PLAN: This is a very pleasant 74 years old white female with a stage IIIa non-small cell lung cancer, poorly differentiated squamous cell carcinoma.   The patient underwent a course of concurrent chemoradiation with weekly carboplatin and paclitaxel status post 7 cycles with partial response.  The patient completed a course of consolidation treatment with immunotherapy with Imfinzi status post 25 cycles.  She is currently on observation and she is feeling fine today with no concerning complaints. She had repeat CT scan of the chest that showed no concerning findings for disease progression. I recommended for the patient to continue on observation with repeat CT scan of the chest in 6 months. She was advised to call immediately if she has any concerning symptoms in the interval. The patient voices understanding of current disease status and treatment options and is in agreement with the current care plan. All questions were answered. The patient knows to call the clinic with any problems, questions or concerns. We can certainly see the patient much sooner if necessary.  Disclaimer: This note was dictated with voice recognition software. Similar sounding words can inadvertently be transcribed and may not be corrected upon review.

## 2019-10-09 ENCOUNTER — Telehealth: Payer: Self-pay | Admitting: Internal Medicine

## 2019-10-09 NOTE — Telephone Encounter (Signed)
Scheduled per los. Called and left msg. Mailed printout  °

## 2020-04-01 ENCOUNTER — Telehealth: Payer: Self-pay | Admitting: Medical Oncology

## 2020-04-01 NOTE — Telephone Encounter (Signed)
Cancel March appts. She is in [place].   She will call after she returns home to schedule her labs /Ct and f/u . Expected date changed on orders.  Prednisone premed-CT - I told her to call pharmacy to refill it  because 3 refills were sent in Sept 2021.

## 2020-04-01 NOTE — Telephone Encounter (Signed)
VM on pharmacy line to refill prednisone.

## 2020-04-02 ENCOUNTER — Other Ambulatory Visit: Payer: Medicare Other

## 2020-04-05 ENCOUNTER — Ambulatory Visit: Payer: Medicare Other | Admitting: Internal Medicine

## 2020-04-28 ENCOUNTER — Telehealth: Payer: Self-pay | Admitting: Physician Assistant

## 2020-04-28 NOTE — Telephone Encounter (Signed)
Scheduled appts per 4/20 sch msg. Pt aware.  

## 2020-05-04 ENCOUNTER — Other Ambulatory Visit: Payer: Self-pay

## 2020-05-04 ENCOUNTER — Telehealth: Payer: Self-pay

## 2020-05-04 DIAGNOSIS — Z91041 Radiographic dye allergy status: Secondary | ICD-10-CM

## 2020-05-04 DIAGNOSIS — C3492 Malignant neoplasm of unspecified part of left bronchus or lung: Secondary | ICD-10-CM

## 2020-05-04 MED ORDER — PREDNISONE 50 MG PO TABS: ORAL_TABLET | ORAL | 3 refills | Status: DC

## 2020-05-04 MED ORDER — DIPHENHYDRAMINE HCL 50 MG PO TABS: 50.0000 mg | ORAL_TABLET | ORAL | 0 refills | Status: DC

## 2020-05-04 NOTE — Telephone Encounter (Signed)
Notification received from Radiology that pt needs 13hr CT prep due to her dye allergy. I have sent these rx's to pts pharmacy.  I have called and left a message for the pt on her number and called the pts sister to advise and remind her of this as well.

## 2020-05-04 NOTE — Progress Notes (Signed)
Notification received from Radiology that pt needs 13hr CT prep due to her dye allergy. I have sent these rx's to pts pharmacy.

## 2020-05-05 ENCOUNTER — Other Ambulatory Visit: Payer: Self-pay

## 2020-05-05 ENCOUNTER — Inpatient Hospital Stay: Payer: Medicare Other | Attending: Physician Assistant

## 2020-05-05 ENCOUNTER — Ambulatory Visit (HOSPITAL_COMMUNITY)
Admission: RE | Admit: 2020-05-05 | Discharge: 2020-05-05 | Disposition: A | Discharge status: Home / Self Care | Payer: Medicare Other | Source: Ambulatory Visit | Attending: Internal Medicine | Admitting: Internal Medicine

## 2020-05-05 DIAGNOSIS — G8929 Other chronic pain: Secondary | ICD-10-CM | POA: Insufficient documentation

## 2020-05-05 DIAGNOSIS — Z79899 Other long term (current) drug therapy: Secondary | ICD-10-CM | POA: Diagnosis not present

## 2020-05-05 DIAGNOSIS — C3490 Malignant neoplasm of unspecified part of unspecified bronchus or lung: Secondary | ICD-10-CM

## 2020-05-05 DIAGNOSIS — M549 Dorsalgia, unspecified: Secondary | ICD-10-CM | POA: Diagnosis not present

## 2020-05-05 DIAGNOSIS — J449 Chronic obstructive pulmonary disease, unspecified: Secondary | ICD-10-CM | POA: Insufficient documentation

## 2020-05-05 DIAGNOSIS — C3432 Malignant neoplasm of lower lobe, left bronchus or lung: Secondary | ICD-10-CM | POA: Insufficient documentation

## 2020-05-05 DIAGNOSIS — Z9221 Personal history of antineoplastic chemotherapy: Secondary | ICD-10-CM | POA: Insufficient documentation

## 2020-05-05 DIAGNOSIS — Z923 Personal history of irradiation: Secondary | ICD-10-CM | POA: Diagnosis not present

## 2020-05-05 LAB — CMP (CANCER CENTER ONLY)
ALT: <6 U/L (ref 0–44)
AST: 14 U/L — ABNORMAL LOW (ref 15–41)
Albumin: 3.7 g/dL (ref 3.5–5.0)
Alkaline Phosphatase: 110 U/L (ref 38–126)
Anion gap: 9 (ref 5–15)
BUN: 16 mg/dL (ref 8–23)
CO2: 31 mmol/L (ref 22–32)
Calcium: 9.1 mg/dL (ref 8.9–10.3)
Chloride: 102 mmol/L (ref 98–111)
Creatinine: 0.83 mg/dL (ref 0.44–1.00)
GFR, Estimated: >60 mL/min (ref >60)
Glucose, Bld: 125 mg/dL — ABNORMAL HIGH (ref 70–99)
Potassium: 3.8 mmol/L (ref 3.5–5.1)
Sodium: 142 mmol/L (ref 135–145)
Total Bilirubin: 0.4 mg/dL (ref 0.3–1.2)
Total Protein: 7.6 g/dL (ref 6.5–8.1)

## 2020-05-05 LAB — CBC WITH DIFFERENTIAL (CANCER CENTER ONLY)
Abs Immature Granulocytes: 0.03 10*3/uL (ref 0.00–0.07)
Basophils Absolute: 0 10*3/uL (ref 0.0–0.1)
Basophils Relative: 1 %
Eosinophils Absolute: 0 10*3/uL (ref 0.0–0.5)
Eosinophils Relative: 0 %
HCT: 42.7 % (ref 36.0–46.0)
Hemoglobin: 13.3 g/dL (ref 12.0–15.0)
Immature Granulocytes: 0 %
Lymphocytes Relative: 8 %
Lymphs Abs: 0.5 10*3/uL — ABNORMAL LOW (ref 0.7–4.0)
MCH: 28.5 pg (ref 26.0–34.0)
MCHC: 31.1 g/dL (ref 30.0–36.0)
MCV: 91.4 fL (ref 80.0–100.0)
Monocytes Absolute: 0.1 10*3/uL (ref 0.1–1.0)
Monocytes Relative: 1 %
Neutro Abs: 6.3 10*3/uL (ref 1.7–7.7)
Neutrophils Relative %: 90 %
Platelet Count: 266 10*3/uL (ref 150–400)
RBC: 4.67 MIL/uL (ref 3.87–5.11)
RDW: 15 % (ref 11.5–15.5)
WBC Count: 7 10*3/uL (ref 4.0–10.5)
nRBC: 0 % (ref 0.0–0.2)

## 2020-05-05 MED ORDER — IOHEXOL 300 MG/ML  SOLN
75.0000 mL | Freq: Once | INTRAMUSCULAR | Status: AC | PRN
  Administered 2020-05-05: 75 mL via INTRAVENOUS

## 2020-05-06 NOTE — Progress Notes (Signed)
[facility] OFFICE PROGRESS NOTE  [name], [place] [address]  DIAGNOSIS: Stage IIb/IIIa (T3, N0/N1, M0)non-small cell lung cancer poorly differentiated squamous cell carcinoma diagnosed in August 2019. The patient presented with large superior segment leftlower lobe mass with questionable left hilar adenopathy  PRIOR THERAPY:  1)Concurrent chemoradiation with weekly carboplatin for AUC of 2 and paclitaxel 45 mg/M2. Status post 7 cycles. Last dose was given October 29, 2017 with partial response 2)Consolidation treatment with immunotherapy with Imfinzi 10 mg/KG every 2 weeks. Status post25cycles. Last dose 12/12/2018  CURRENT THERAPY:  Observation  INTERVAL HISTORY: Sherry [name] 75 y.o. female returns to the clinic today for a follow-up visit. The patient is feeling well today without any concerning complaints except she has some skin lesions that she is concerned about. She had a basal cell carcinoma removed from her nose within the last 5 years but cannot recall who her dermatologist was. I do not have records of biopsied skin lesions in my EMR to see who performed this procedure. She has a few small dry skin growths on her head and a plaque appearing lesion in her scalp. She states she is going to make an appointment to see her PCP next week.  Regarding her malignancy, the patient completed 25 cycles of consolidation immunotherapy in December 2020.  She has been on observation since that time and she is feeling well. She started smoking again due to stress. She quit before. She states the nicotine gum makes her sick. She states that the OTC nicotine replacement is too expensive but would be open to a prescription. Today she denies any recent fever, chills, or night sweats. She still continues to be cachetic appearing but her weight is stable. She denies any chest pain or hemoptysis. She denies dyspnea. She sometimes has coughing  spells, especially due to her bad allergies and if she is outside. She also notes submandibular adenopathy bilaterally that comes and goes. No other adenopathy in the cervical region or supraclavicular region. She also has wheezing due to her COPD. She reports frequent wheezing. She is prescribed inhalers. She denies any nausea, vomiting, diarrhea, or constipation.  She denies any unusual headache or visual changes.  The patient recently had a restaging CT scan performed.  She is here today for evaluation and to review her scan results.  MEDICAL HISTORY: Past Medical History:  Diagnosis Date  . Allergy   . Anxiety   . Arthritis   . Cavitating mass in left lower lung lobe   . Complication of anesthesia    woke up during procedure  . COPD (chronic obstructive pulmonary disease) ([place])   . Depression   . Family history of adverse reaction to anesthesia    " my mother was always hard to wake up"  . Full dentures   . Headache   . Neuromuscular disorder ([place])   . Pneumonia   . Thoracic spine fracture ([place]) 11/01/2017   T9  . Wears glasses     ALLERGIES:  is allergic to omnipaque [iohexol] and penicillins.  MEDICATIONS:  Current Outpatient Medications  Medication Sig Dispense Refill  . nicotine (NICODERM CQ - DOSED IN MG/24 HR) 7 mg/24hr patch Place 1 patch (7 mg total) onto the skin daily. 28 patch 0  . nicotine polacrilex (COMMIT) 2 MG lozenge Take 1 lozenge (2 mg total) by mouth as needed for smoking cessation. 100 tablet 0  . acetaminophen (TYLENOL 8 HOUR)  650 MG CR tablet Take 1 tablet (650 mg total) by mouth every 8 (eight) hours as needed for pain or fever. 30 tablet 0  . albuterol (VENTOLIN HFA) 108 (90 Base) MCG/ACT inhaler INHALE 2 PUFFS INTO THE LUNGS EVERY 6 HOURS AS NEEDED FOR WHEEZING OR SHORTNESS OF BREATH 25.5 g 1  . alendronate (FOSAMAX) 70 MG tablet Take 70 mg by mouth once a week.    . diphenhydrAMINE (BENADRYL) 50 MG tablet Take 1 tablet (50 mg total) by mouth as  directed. Take one tablet po one hour prior to scan 30 tablet 0  . gabapentin (NEURONTIN) 100 MG capsule Take 1 capsule (100 mg total) by mouth 3 (three) times daily. Take 1 tablet once a day 90 capsule 2  . guaifenesin (ROBITUSSIN) 100 MG/5ML syrup Take 200 mg by mouth 3 (three) times daily as needed for cough.     Marland Kitchen HYDROcodone-acetaminophen (NORCO/VICODIN) 5-325 MG tablet TK 1-2 TS PO Q 6 H PRF MODERATE PAIN    . Menthol-Ascorbic Acid (LUDENS COUGH DROPS MT) Use as directed 1 lozenge in the mouth or throat 3 (three) times daily as needed (cough).     . Multiple Vitamin (MULTIVITAMIN WITH MINERALS) TABS tablet Take 1 tablet by mouth daily.    . predniSONE (DELTASONE) 50 MG tablet Take 1 (50 mg) tablet 13 hours, 7 hours, and one hour before your CT scan 3 tablet 3  . prochlorperazine (COMPAZINE) 10 MG tablet Take 1 tablet (10 mg total) by mouth every 6 (six) hours as needed for nausea or vomiting. 30 tablet 0   No current facility-administered medications for this visit.    SURGICAL HISTORY:  Past Surgical History:  Procedure Laterality Date  . ELBOW SURGERY     for  "Tennis Elbow"  . hand surgery    . IR KYPHO THORACIC WITH BONE BIOPSY  11/27/2017  . IR KYPHO THORACIC WITH BONE BIOPSY  03/01/2018  . IR KYPHO THORACIC WITH BONE BIOPSY  03/26/2018  . IR RADIOLOGIST EVAL & MGMT  11/20/2017  . IR RADIOLOGIST EVAL & MGMT  02/26/2018  . IR RADIOLOGIST EVAL & MGMT  03/13/2018  . MULTIPLE TOOTH EXTRACTIONS    . TUBAL LIGATION    . VIDEO BRONCHOSCOPY N/A 08/31/2017   Procedure: VIDEO BRONCHOSCOPY;  Surgeon: Melrose Nakayama, MD;  Location: [facility];  Service: Thoracic;  Laterality: N/A;    REVIEW OF SYSTEMS:   Review of Systems  Constitutional: Negative for appetite change, chills, fatigue, fever and unexpected weight change.  HENT: Positive for seasonal allergies. Negative for mouth sores, nosebleeds, sore throat and trouble swallowing.   Eyes: Negative for eye problems and icterus.   Respiratory: Positive for occasional wheezing and dry cough. Negative for hemoptysis and shortness of breath. Cardiovascular: Negative for chest pain and leg swelling.  Gastrointestinal: Negative for abdominal pain, constipation, diarrhea, nausea and vomiting.  Genitourinary: Negative for bladder incontinence, difficulty urinating, dysuria, frequency and hematuria.   Musculoskeletal: Negative for back pain, gait problem, neck pain and neck stiffness.  Skin: Positive for skin lesions.  Neurological: Negative for dizziness, extremity weakness, gait problem, headaches, light-headedness and seizures.  Hematological: Positive for submandibular adenopathy. Does not bruise/bleed easily.  Psychiatric/Behavioral: Negative for confusion, depression and sleep disturbance. The patient is not nervous/anxious.    PHYSICAL EXAMINATION:  Blood pressure 136/84, pulse (!) 115, temperature 98.1 F (36.7 C), temperature source Tympanic, resp. rate 17, height 5\' 1"  (1.549 m), weight 70 lb 14.4 oz (32.2 kg), SpO2 95 %.  ECOG PERFORMANCE STATUS: 1 - Symptomatic but completely ambulatory  Physical Exam  Constitutional: Oriented to person, place, and time and cachetic appearing female and in no acute distress.  HENT:  Head: Normocephalic and atraumatic.  Mouth/Throat: Oropharynx is clear and moist. No oropharyngeal exudate.  Eyes: Conjunctivae are normal. Right eye exhibits no discharge. Left eye exhibits no discharge. No scleral icterus.  Neck: Normal range of motion. Neck supple.  Cardiovascular: Normal rate, regular rhythm, normal heart sounds and intact distal pulses.   Pulmonary/Chest: Effort normal. Wheezing in all lung fields.  No respiratory distress. No rales.  Abdominal: Soft. Bowel sounds are normal. Exhibits no distension and no mass. There is no tenderness.  Musculoskeletal: Normal range of motion. Exhibits no edema.  Lymphadenopathy:    No cervical adenopathy.  Neurological: Alert and oriented  to person, place, and time. Exhibits normal muscle tone. Gait normal. Coordination normal.  Skin: Skin is warm and dry. Has some skin lesions on face that appear to be actinic keratosis. She also has a large plaque dry skin lesion in her scalp. Not diaphoretic. No erythema. No pallor.  Psychiatric: Mood, memory and judgment normal.  Vitals reviewed.  LABORATORY DATA: Lab Results  Component Value Date   WBC 7.0 05/05/2020   HGB 13.3 05/05/2020   HCT 42.7 05/05/2020   MCV 91.4 05/05/2020   PLT 266 05/05/2020      Chemistry      Component Value Date/Time   NA 142 05/05/2020 1357   K 3.8 05/05/2020 1357   CL 102 05/05/2020 1357   CO2 31 05/05/2020 1357   BUN 16 05/05/2020 1357   CREATININE 0.83 05/05/2020 1357      Component Value Date/Time   CALCIUM 9.1 05/05/2020 1357   ALKPHOS 110 05/05/2020 1357   AST 14 (L) 05/05/2020 1357   ALT <6 05/05/2020 1357   BILITOT 0.4 05/05/2020 1357       RADIOGRAPHIC STUDIES:  CT Chest W Contrast  Result Date: 05/06/2020 CLINICAL DATA:  Primary Cancer Type: Lung Imaging Indication: Routine surveillance Interval therapy since last imaging? No Initial Cancer Diagnosis Date: 08/31/2017; Established by: Biopsy-proven Detailed Pathology: Stage IIb/IIIa non-small cell lung cancer; poorly differentiated squamous cell carcinoma. Primary Tumor location: Left lower lobe. Surgeries: Kyphoplasty. Chemotherapy: Yes; Ongoing? No; Most recent administration: 10/29/2017 Immunotherapy?  Yes; Type: Imfinzi; Ongoing? No Radiation therapy? Yes; Date Range: 09/17/2017 - 10/31/2017; Target: Left lung EXAM: CT CHEST WITH CONTRAST TECHNIQUE: Multidetector CT imaging of the chest was performed during intravenous contrast administration. CONTRAST:  72mL OMNIPAQUE IOHEXOL 300 MG/ML  SOLN COMPARISON:  Most recent CT chest 10/01/2019.  08/03/2017 PET-CT. FINDINGS: Cardiovascular: Normal heart size. No significant pericardial effusion/thickening. Left anterior descending  coronary atherosclerosis. Atherosclerotic nonaneurysmal thoracic aorta. Normal caliber pulmonary arteries. No central pulmonary emboli. Mediastinum/Nodes: Subcentimeter hypodense bilateral thyroid nodules are unchanged. Not clinically significant; no follow-up imaging recommended (ref: J Am Coll Radiol. 2015 Feb;12(2): 143-50). Unremarkable esophagus. No pathologically enlarged axillary, mediastinal or hilar lymph nodes. Lungs/Pleura: No pneumothorax. Small loculated basilar left pleural effusion is unchanged. No right pleural effusion. Severe centrilobular emphysema with diffuse bronchial wall thickening. Sharply marginated patchy left lower perihilar consolidation with associated bronchiectasis, volume loss and distortion, not appreciably changed, compatible with radiation fibrosis. No acute consolidative airspace disease. Right lower lobe 7 mm solid pulmonary nodule (series 5/image 65), stable. Anterior right upper lobe 2 mm solid pulmonary nodule (series 5/image 70), stable. No new significant pulmonary nodules. Upper abdomen: No acute abnormality. Musculoskeletal: No aggressive appearing focal  osseous lesions. Chronic T9, T10 and T11 vertebral compression fracture status post vertebroplasty. Mild thoracic spondylosis. IMPRESSION: 1. Stable radiation fibrosis in the left lower perihilar lung. No evidence of local tumor recurrence. Stable small loculated basilar left pleural effusion. 2. No evidence of metastatic disease in the chest. 3. Severe centrilobular emphysema with diffuse bronchial wall thickening, suggesting COPD. 4. One vessel coronary atherosclerosis. 5. Aortic Atherosclerosis (ICD10-I70.0) and Emphysema (ICD10-J43.9). Electronically Signed   By: Ilona Sorrel M.D.   On: 05/06/2020 10:07     ASSESSMENT/PLAN:  This is a very pleasant 75 year old Caucasian female with a history of stage IIIa non-small cell lung cancer, poorly differentiated squamous cell carcinoma. She presented with a large  superior segment of theleft lower lobe mass with questionable left hilar adenopathy. She was diagnosed in August 2019.   The patient underwent a course of concurrent chemoradiation with carboplatin for an AUC of 2 and paclitaxel45 mg/m. Status post 7 cycles with a partial response.  The patientis status post 25 cycles ofconsolidation immunotherapy with Imfinzi 10 mg/kg IV every 2 weeks. This was completed in December 2020.  She recently had a restaging CT scan performed. Dr. Julien Nordmann personally and independently reviewed the scan and discussed the results with the patient. The scan showed no evidence of disease progression.   Dr. Julien Nordmann recommendsthat she continue on observation with a repeat scan in 6 months.  We will see her back for a follow up visit in 6 months for evaluation and to review her scan.  The patient does not want her prescription for the pre-medication sent at this time due to the concern that she will lose it. I discussed with her to call the clinic as her scan gets closer to send the prednisone premedication due to her contrast allergy.   I spent several minutes counseling the patient on smoking cessation. She states the OTC NRT is too expensive. The gum makes her nauseous. I sent in a prescription for the lozenges and the lowest dose of the patches.   The patient is wondering if she can have a prescription for anxiety medication. I advised her to discuss with her PCP when she sees her next week. I also advised that she discuss her skin lesions with them as well and consideration of referral to dermatology for biopsy at some point if they are unable to tell which dermatology practice she was seen at in the past.   The patient was advised to call immediately if she has any concerning symptoms in the interval. The patient voices understanding of current disease status and treatment options and is in agreement with the current care plan. All questions were answered.  The patient knows to call the clinic with any problems, questions or concerns. We can certainly see the patient much sooner if necessary     Orders Placed This Encounter  Procedures  . CT Chest W Contrast    Standing Status:   Future    Standing Expiration Date:   05/10/2021    Order Specific Question:   If indicated for the ordered procedure, I authorize the administration of contrast media per Radiology protocol    Answer:   Yes    Order Specific Question:   Preferred imaging location?    Answer:   [facility]  . CBC with Differential ([place] Only)    Standing Status:   Future    Standing Expiration Date:   05/10/2021  . CMP ([facility] only)    Standing  Status:   Future    Standing Expiration Date:   05/10/2021      Tobe Sos Tonya Carlile, PA-C 05/10/20  ADDENDUM: Hematology/Oncology Attending: I had a face-to-face encounter with the patient today.  I reviewed her records, scan and recommended her care plan.  This is a very pleasant 75 years old white female with stage IIIa non-small cell lung cancer, squamous cell carcinoma diagnosed in August 2019 status post a course of concurrent chemoradiation with weekly carboplatin and paclitaxel followed by consolidation treatment with immunotherapy every 2 weeks for 25 cycles.  The patient has been in observation since that time.  She is feeling fine today with no concerning complaints except for the baseline shortness of breath secondary to continuous smoking. She had repeat CT scan of the chest performed recently.  I personally and independently reviewed the scans and discussed the results with the patient today. Her scan showed no concerning findings for disease progression. I recommended for her to continue on observation with repeat CT scan of the chest in 6 months. She was advised to call immediately if she has any concerning symptoms in the interval. The total time spent in the appointment was 20  minutes. Disclaimer: This note was dictated with voice recognition software. Similar sounding words can inadvertently be transcribed and may be missed upon review. Eilleen Kempf, MD 05/10/20

## 2020-05-07 ENCOUNTER — Telehealth: Payer: Self-pay

## 2020-05-07 NOTE — Telephone Encounter (Signed)
Pt LM stating she received two text messages regarding rx's sent and she only received one.  I reviewed pts chart and found that Dr. Julien Nordmann sent Benadryl and Deltasone on 4/26. I have called the pt back and LM advising of this and for her to follow-up with the pharmacy. I also stated they may tell her that benadryl is OTC and not covered but she will need to follow-up with them regarding it.

## 2020-05-10 ENCOUNTER — Encounter: Payer: Self-pay | Admitting: Physician Assistant

## 2020-05-10 ENCOUNTER — Other Ambulatory Visit: Payer: Self-pay

## 2020-05-10 ENCOUNTER — Inpatient Hospital Stay: Payer: Medicare Other | Attending: Physician Assistant | Admitting: Physician Assistant

## 2020-05-10 VITALS — BP 136/84 | HR 115 | Temp 98.1°F | Resp 17 | Ht 61.0 in | Wt 70.9 lb

## 2020-05-10 DIAGNOSIS — F17290 Nicotine dependence, other tobacco product, uncomplicated: Secondary | ICD-10-CM | POA: Insufficient documentation

## 2020-05-10 DIAGNOSIS — J449 Chronic obstructive pulmonary disease, unspecified: Secondary | ICD-10-CM | POA: Insufficient documentation

## 2020-05-10 DIAGNOSIS — C3432 Malignant neoplasm of lower lobe, left bronchus or lung: Secondary | ICD-10-CM | POA: Insufficient documentation

## 2020-05-10 DIAGNOSIS — C3492 Malignant neoplasm of unspecified part of left bronchus or lung: Secondary | ICD-10-CM | POA: Diagnosis not present

## 2020-05-10 DIAGNOSIS — L988 Other specified disorders of the skin and subcutaneous tissue: Secondary | ICD-10-CM | POA: Insufficient documentation

## 2020-05-10 DIAGNOSIS — F17200 Nicotine dependence, unspecified, uncomplicated: Secondary | ICD-10-CM | POA: Diagnosis not present

## 2020-05-10 MED ORDER — NICOTINE POLACRILEX 2 MG MT LOZG: 2.0000 mg | LOZENGE | OROMUCOSAL | 0 refills | Status: DC | PRN

## 2020-05-10 MED ORDER — NICOTINE 7 MG/24HR TD PT24: 7.0000 mg | MEDICATED_PATCH | Freq: Every day | TRANSDERMAL | 0 refills | Status: DC

## 2020-07-26 IMAGING — CT CT ANGIO CHEST
3 of 7 series · 18 of 36 positions shown · IV contrast (APPLIED)
Comparison: Chest CT 07/13/2017, PET-CT 08/03/2017

CLINICAL DATA: Chest pain and shortness of breath. Recent diagnosis
of lung cancer.

EXAM:
CT ANGIOGRAPHY CHEST WITH CONTRAST
TECHNIQUE: Multidetector CT imaging of the chest was performed using the
standard protocol during bolus administration of intravenous
contrast. Multiplanar CT image reconstructions and MIPs were
obtained to evaluate the vascular anatomy.
CONTRAST:  80mL [id number] IOPAMIDOL ([id number]) INJECTION 76%

[Series 6: lung · axial · 0.50mm/px · z∈[+1285,+1397]mm · 3 of 140 slices shown]
[im 28/140  mediastinal]
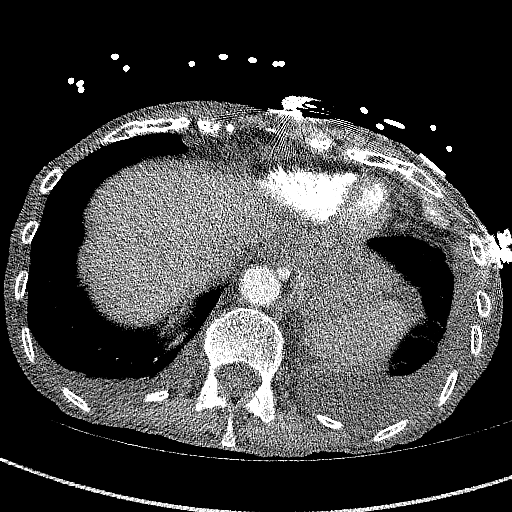
[im 56/140  mediastinal]
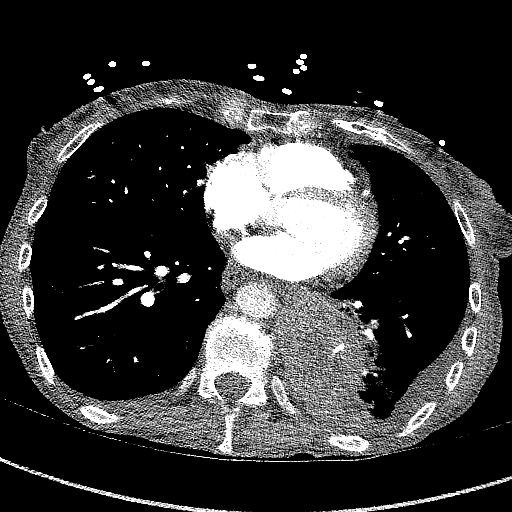
[im 84/140  mediastinal]
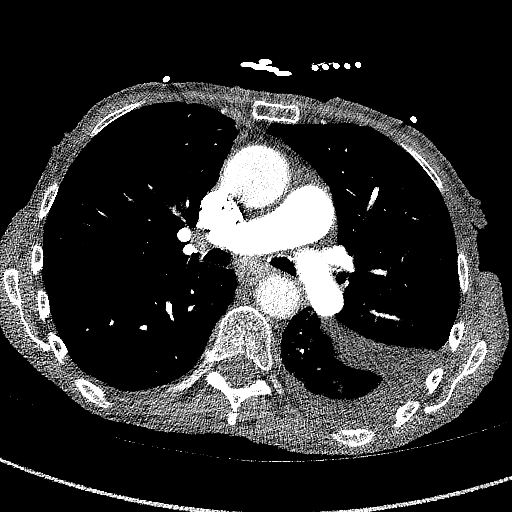

[Series 7: thins · axial · 0.50mm/px · z∈[+1248,+1490]mm · 14 of 400 slices shown]
[im 27/400  lung]
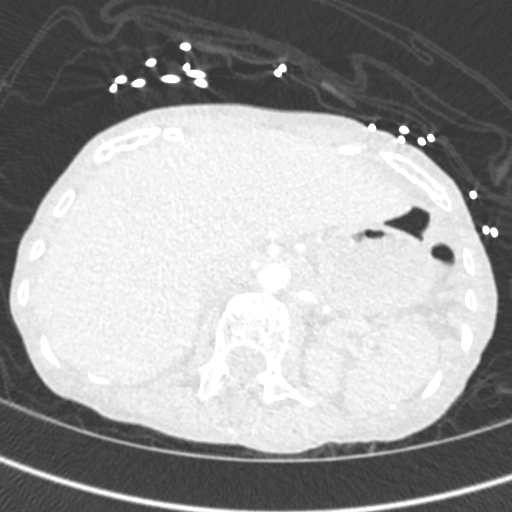
[im 54/400  mediastinal]
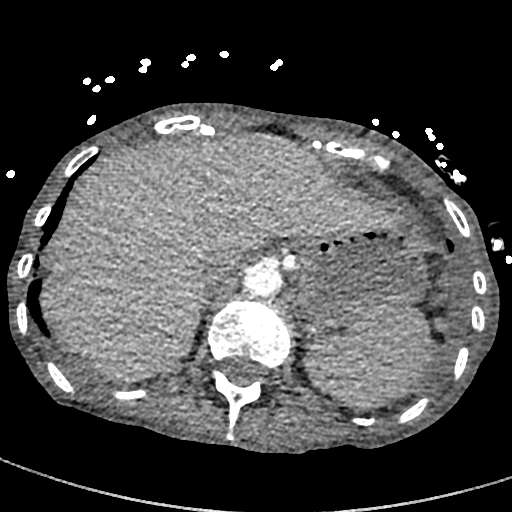
[im 80/400  lung]
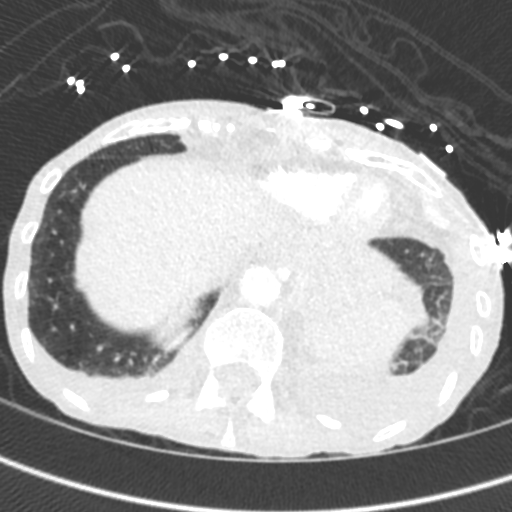
[im 107/400  mediastinal]
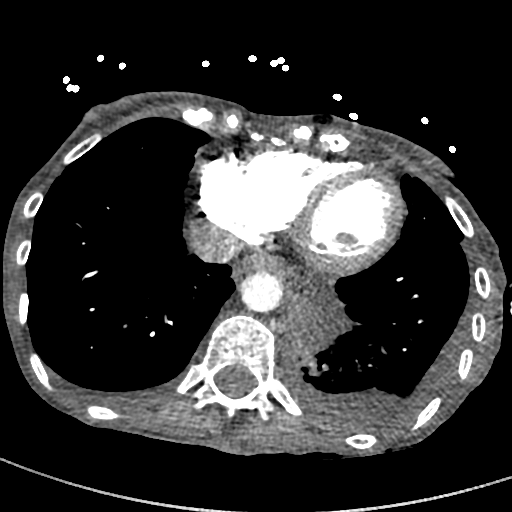
[im 134/400  lung]
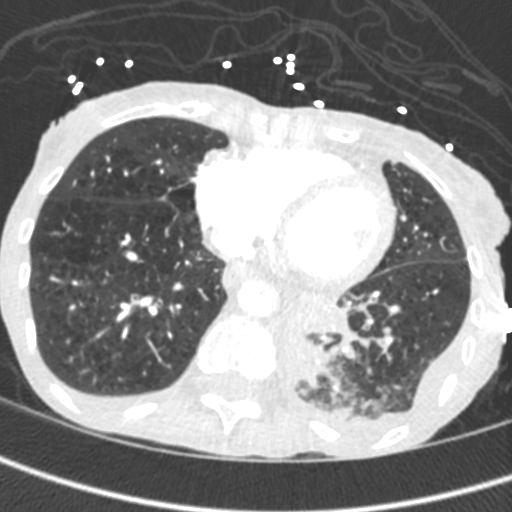
[im 160/400  mediastinal]
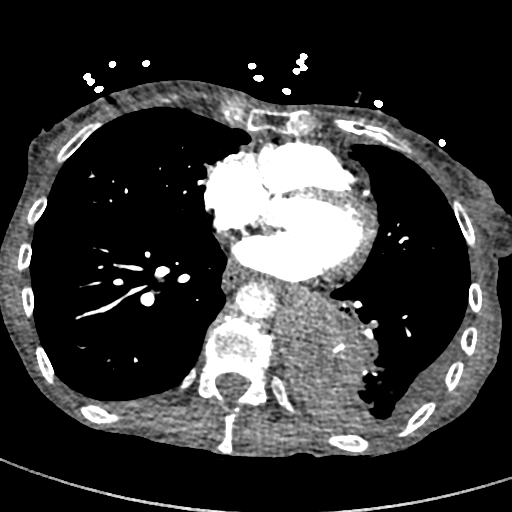
[im 187/400  lung]
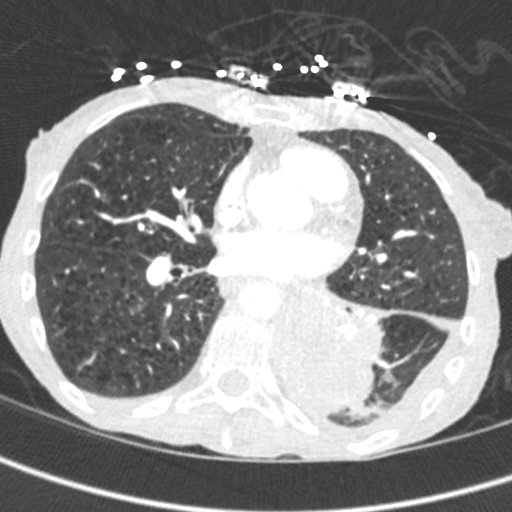
[im 213/400  mediastinal]
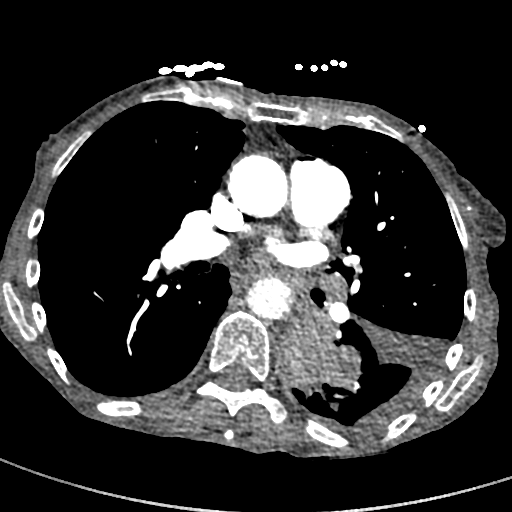
[im 240/400  lung]
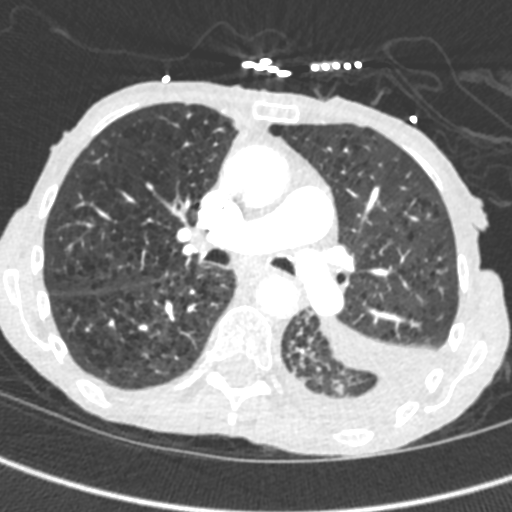
[im 267/400  mediastinal]
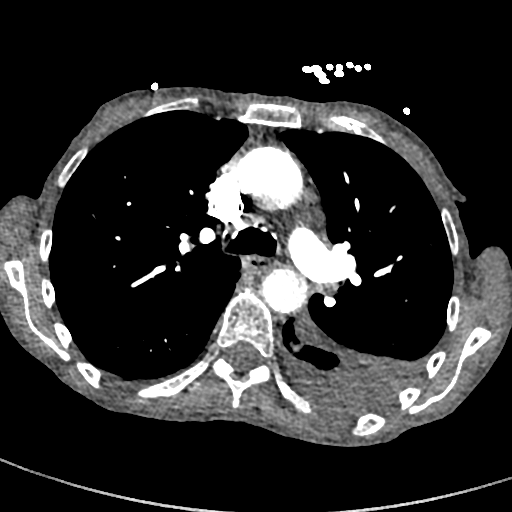
[im 293/400  lung]
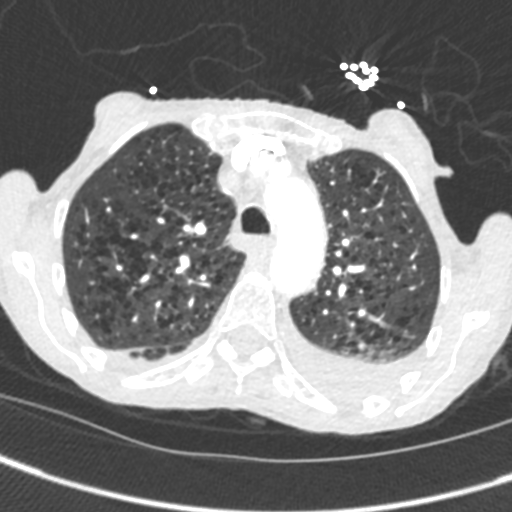
[im 320/400  mediastinal]
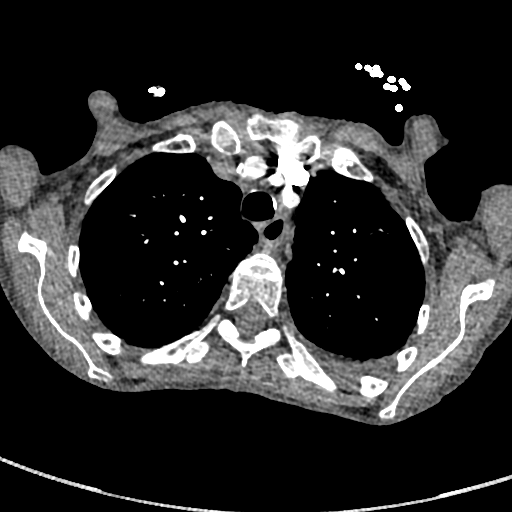
[im 346/400  lung]
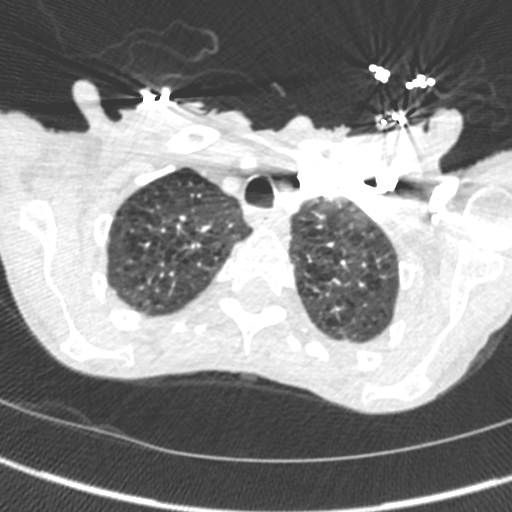
[im 373/400  mediastinal]
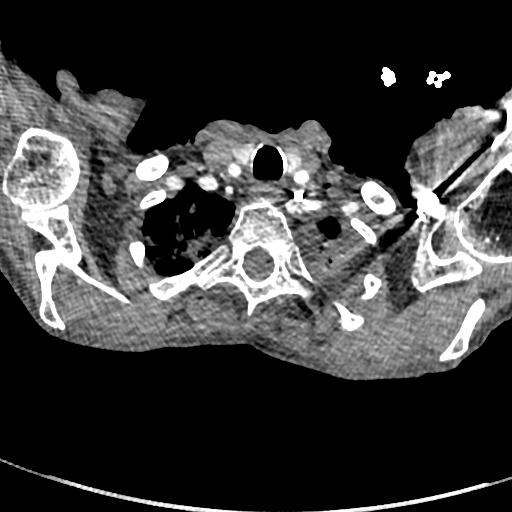

[Series 8: cor · coronal · 0.53mm/px · 1 of 101 slices shown]
[im 51/101  mediastinal]
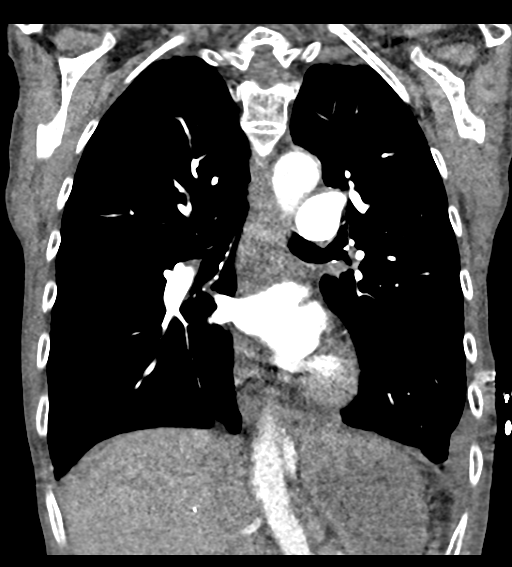

[18 of 36 positions shown; findings below may reference images not displayed]

FINDINGS: Cardiovascular: There are no filling defects within the pulmonary
arteries to suggest pulmonary embolus. None central left lower lobe
lung mass partially encases segmental left lower lobe pulmonary
artery with associated narrowing, no evidence of invasion or filling
defect. Aortic tortuosity without dissection. Mild aortic
atherosclerosis. No significant pericardial effusion.

Mediastinum/Nodes: Again seen 12 mm left hilar lymph nodes. Small
upper paratracheal and superior mediastinal nodes not enlarged by
size criteria. A right hilar node measures 9 mm, unchanged from
prior CT and not hypermetabolic on PET. The esophagus is
decompressed. Visualized thyroid gland is normal.

Lungs/Pleura: Heterogeneous left lower lobe pulmonary mass appears
grossly unchanged in size allowing for differences in caliper
placement measuring approximately 5.9 x 4.9 x 7 cm. Progressive
patchy opacities and nodular in the left lower lobe. Left pleural
effusion which is minimally loculated has progressed from prior PET,
moderate in size. There is a small right pleural effusion that is
new. 7 mm right lower lobe pulmonary nodule image 63 series 6 is
unchanged from prior exam. Again seen advanced emphysema. Biapical
pleuroparenchymal scarring.

Upper Abdomen: No acute findings, paucity of intra-abdominal fat
limits assessment.

Musculoskeletal: Generalized cachexia. No discrete lytic or blastic
osseous lesions. No acute osseous abnormalities.

Review of the MIP images confirms the above findings.
IMPRESSION: 1. No pulmonary embolus.
2. Grossly unchanged left infrahilar pulmonary mass since PET-CT 5
weeks ago. Increased patchy and nodular opacities in the more distal
left lower lobe likely postobstructive pneumonitis. Unchanged size
of bilateral hilar lymph nodes, previously not hypermetabolic on
PET.
3. Development of partially loculated moderate left pleural effusion
and small right pleural effusion, new.
4. Advanced emphysema. Aortic Atherosclerosis (TBBYF-EPK.K) and
Emphysema (TBBYF-NK7.5).

## 2020-11-04 ENCOUNTER — Other Ambulatory Visit: Payer: Self-pay | Admitting: Internal Medicine

## 2020-11-04 ENCOUNTER — Telehealth: Payer: Self-pay | Admitting: Medical Oncology

## 2020-11-04 DIAGNOSIS — C3492 Malignant neoplasm of unspecified part of left bronchus or lung: Secondary | ICD-10-CM

## 2020-11-04 DIAGNOSIS — Z91041 Radiographic dye allergy status: Secondary | ICD-10-CM

## 2020-11-04 NOTE — Telephone Encounter (Signed)
Pt requesting refill for prednisone. I told her to contact her pharmacy because she has 3 refills.

## 2020-11-08 ENCOUNTER — Telehealth: Payer: Self-pay | Admitting: Internal Medicine

## 2020-11-08 ENCOUNTER — Telehealth: Payer: Self-pay | Admitting: Medical Oncology

## 2020-11-08 ENCOUNTER — Other Ambulatory Visit: Payer: Medicare Other

## 2020-11-08 NOTE — Telephone Encounter (Signed)
Cancelled per 10/31 pt request, pt said they will call back to reschedule

## 2020-11-08 NOTE — Telephone Encounter (Signed)
Sore throat . She cancelled her appts for scans/f/u this week and on 11/07.  She will call back the cancer center to r/s when she feels better.

## 2020-11-10 ENCOUNTER — Ambulatory Visit: Payer: Medicare Other | Admitting: Physician Assistant

## 2020-11-10 ENCOUNTER — Other Ambulatory Visit: Payer: Medicare Other

## 2020-11-10 ENCOUNTER — Ambulatory Visit (HOSPITAL_COMMUNITY): Payer: Medicare Other

## 2020-11-12 ENCOUNTER — Ambulatory Visit: Payer: Medicare Other | Admitting: Physician Assistant

## 2020-11-15 ENCOUNTER — Ambulatory Visit: Payer: Medicare Other | Admitting: Physician Assistant

## 2020-11-26 ENCOUNTER — Telehealth: Payer: Self-pay
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
## 2020-12-30 ENCOUNTER — Telehealth: Payer: Self-pay | Admitting: Medical Oncology

## 2020-12-30 NOTE — Telephone Encounter (Signed)
Pt will schedule CT scan for January and let us know so we can schedule labs and f/u.

## 2021-06-20 ENCOUNTER — Encounter: Payer: Self-pay | Admitting: Internal Medicine

## 2021-06-20 ENCOUNTER — Emergency Department (HOSPITAL_COMMUNITY): Payer: Medicare Other

## 2021-06-20 ENCOUNTER — Inpatient Hospital Stay (HOSPITAL_COMMUNITY)
Admission: EM | Admit: 2021-06-20 | Discharge: 2021-06-23 | DRG: 291 | Disposition: A | Discharge status: Home / Self Care | Payer: Medicare Other | Attending: Internal Medicine | Admitting: Internal Medicine

## 2021-06-20 DIAGNOSIS — J9602 Acute respiratory failure with hypercapnia: Secondary | ICD-10-CM | POA: Diagnosis present

## 2021-06-20 DIAGNOSIS — C3492 Malignant neoplasm of unspecified part of left bronchus or lung: Secondary | ICD-10-CM | POA: Diagnosis present

## 2021-06-20 DIAGNOSIS — F17200 Nicotine dependence, unspecified, uncomplicated: Secondary | ICD-10-CM

## 2021-06-20 DIAGNOSIS — N1831 Chronic kidney disease, stage 3a: Secondary | ICD-10-CM | POA: Diagnosis present

## 2021-06-20 DIAGNOSIS — Z833 Family history of diabetes mellitus: Secondary | ICD-10-CM

## 2021-06-20 DIAGNOSIS — R54 Age-related physical debility: Secondary | ICD-10-CM | POA: Diagnosis present

## 2021-06-20 DIAGNOSIS — W19XXXA Unspecified fall, initial encounter: Principal | ICD-10-CM

## 2021-06-20 DIAGNOSIS — Z66 Do not resuscitate: Secondary | ICD-10-CM | POA: Diagnosis present

## 2021-06-20 DIAGNOSIS — Z20822 Contact with and (suspected) exposure to covid-19: Secondary | ICD-10-CM | POA: Diagnosis present

## 2021-06-20 DIAGNOSIS — J449 Chronic obstructive pulmonary disease, unspecified: Secondary | ICD-10-CM | POA: Diagnosis present

## 2021-06-20 DIAGNOSIS — Z79899 Other long term (current) drug therapy: Secondary | ICD-10-CM

## 2021-06-20 DIAGNOSIS — R42 Dizziness and giddiness: Secondary | ICD-10-CM

## 2021-06-20 DIAGNOSIS — E43 Unspecified severe protein-calorie malnutrition: Secondary | ICD-10-CM | POA: Diagnosis present

## 2021-06-20 DIAGNOSIS — I5033 Acute on chronic diastolic (congestive) heart failure: Secondary | ICD-10-CM | POA: Diagnosis not present

## 2021-06-20 DIAGNOSIS — J9601 Acute respiratory failure with hypoxia: Secondary | ICD-10-CM | POA: Diagnosis present

## 2021-06-20 DIAGNOSIS — J432 Centrilobular emphysema: Secondary | ICD-10-CM | POA: Diagnosis present

## 2021-06-20 DIAGNOSIS — Z923 Personal history of irradiation: Secondary | ICD-10-CM

## 2021-06-20 DIAGNOSIS — R64 Cachexia: Secondary | ICD-10-CM | POA: Diagnosis present

## 2021-06-20 DIAGNOSIS — R55 Syncope and collapse: Secondary | ICD-10-CM

## 2021-06-20 DIAGNOSIS — E46 Unspecified protein-calorie malnutrition: Secondary | ICD-10-CM | POA: Diagnosis present

## 2021-06-20 DIAGNOSIS — F419 Anxiety disorder, unspecified: Secondary | ICD-10-CM | POA: Diagnosis present

## 2021-06-20 DIAGNOSIS — F17210 Nicotine dependence, cigarettes, uncomplicated: Secondary | ICD-10-CM | POA: Diagnosis present

## 2021-06-20 DIAGNOSIS — J441 Chronic obstructive pulmonary disease with (acute) exacerbation: Secondary | ICD-10-CM | POA: Diagnosis present

## 2021-06-20 DIAGNOSIS — R0602 Shortness of breath: Secondary | ICD-10-CM | POA: Diagnosis not present

## 2021-06-20 DIAGNOSIS — F32A Depression, unspecified: Secondary | ICD-10-CM | POA: Diagnosis present

## 2021-06-20 DIAGNOSIS — Z681 Body mass index (BMI) 19 or less, adult: Secondary | ICD-10-CM

## 2021-06-20 DIAGNOSIS — Z9221 Personal history of antineoplastic chemotherapy: Secondary | ICD-10-CM

## 2021-06-20 HISTORY — DX: Malignant (primary) neoplasm, unspecified: C80.1

## 2021-06-20 LAB — CBG MONITORING, ED: Glucose-Capillary: 117 mg/dL — ABNORMAL HIGH (ref 70–99)

## 2021-06-20 LAB — CBC WITH DIFFERENTIAL/PLATELET
Abs Immature Granulocytes: 0.03 10*3/uL (ref 0.00–0.07)
Basophils Absolute: 0 10*3/uL (ref 0.0–0.1)
Basophils Relative: 1 %
Eosinophils Absolute: 0 10*3/uL (ref 0.0–0.5)
Eosinophils Relative: 0 %
HCT: 41 % (ref 36.0–46.0)
Hemoglobin: 12.2 g/dL (ref 12.0–15.0)
Immature Granulocytes: 1 %
Lymphocytes Relative: 13 %
Lymphs Abs: 0.7 10*3/uL (ref 0.7–4.0)
MCH: 28.6 pg (ref 26.0–34.0)
MCHC: 29.8 g/dL — ABNORMAL LOW (ref 30.0–36.0)
MCV: 96 fL (ref 80.0–100.0)
Monocytes Absolute: 0.4 10*3/uL (ref 0.1–1.0)
Monocytes Relative: 8 %
Neutro Abs: 4.4 10*3/uL (ref 1.7–7.7)
Neutrophils Relative %: 77 %
Platelets: 153 10*3/uL (ref 150–400)
RBC: 4.27 MIL/uL (ref 3.87–5.11)
RDW: 14.4 % (ref 11.5–15.5)
WBC: 5.6 10*3/uL (ref 4.0–10.5)
nRBC: 0 % (ref 0.0–0.2)

## 2021-06-20 LAB — COMPREHENSIVE METABOLIC PANEL
ALT: 13 U/L (ref 0–44)
AST: 27 U/L (ref 15–41)
Albumin: 2.9 g/dL — ABNORMAL LOW (ref 3.5–5.0)
Alkaline Phosphatase: 59 U/L (ref 38–126)
Anion gap: 8 (ref 5–15)
BUN: 29 mg/dL — ABNORMAL HIGH (ref 8–23)
CO2: 28 mmol/L (ref 22–32)
Calcium: 8.2 mg/dL — ABNORMAL LOW (ref 8.9–10.3)
Chloride: 106 mmol/L (ref 98–111)
Creatinine, Ser: 1.23 mg/dL — ABNORMAL HIGH (ref 0.44–1.00)
GFR, Estimated: 46 mL/min — ABNORMAL LOW (ref >60)
Glucose, Bld: 129 mg/dL — ABNORMAL HIGH (ref 70–99)
Potassium: 3.9 mmol/L (ref 3.5–5.1)
Sodium: 142 mmol/L (ref 135–145)
Total Bilirubin: 0.5 mg/dL (ref 0.3–1.2)
Total Protein: 5.6 g/dL — ABNORMAL LOW (ref 6.5–8.1)

## 2021-06-20 LAB — I-STAT VENOUS BLOOD GAS, ED
Acid-Base Excess: 1 mmol/L (ref 0.0–2.0)
Bicarbonate: 30.7 mmol/L — ABNORMAL HIGH (ref 20.0–28.0)
Calcium, Ion: 1.1 mmol/L — ABNORMAL LOW (ref 1.15–1.40)
HCT: 38 % (ref 36.0–46.0)
Hemoglobin: 12.9 g/dL (ref 12.0–15.0)
O2 Saturation: 92 %
Potassium: 3.8 mmol/L (ref 3.5–5.1)
Sodium: 144 mmol/L (ref 135–145)
TCO2: 33 mmol/L — ABNORMAL HIGH (ref 22–32)
pCO2, Ven: 76.7 mmHg (ref 44–60)
pH, Ven: 7.21 — ABNORMAL LOW (ref 7.25–7.43)
pO2, Ven: 80 mmHg — ABNORMAL HIGH (ref 32–45)

## 2021-06-20 LAB — TROPONIN I (HIGH SENSITIVITY): Troponin I (High Sensitivity): 80 ng/L — ABNORMAL HIGH (ref <18)

## 2021-06-20 NOTE — ED Provider Notes (Signed)
[facility] EMERGENCY DEPARTMENT Provider Note   CSN: [id number] Arrival date & time: 06/20/21  2038     History  Chief Complaint  Patient presents with   Sherry [name] is a 76 y.o. female with a past medical history of lung cancer who presents to the emergency department brought in by [facility] for syncopal episode and fall onset prior to arrival.  Patient notes that she drove herself to [facility] and felt nauseous and cold.  At that time she walked to the bathroom and woke up with EMS around her.  No meds tried prior to arrival to the ED.  Denies anticoagulant use.  Patient notes she is a current smoker.  She lives at home by herself. Denies chest pain, trouble breathing, nausea, vomiting, dizziness, lightheadedness.  The history is provided by the patient. No language interpreter was used.       Home Medications Prior to Admission medications   Medication Sig Start Date End Date Taking? Authorizing Provider  acetaminophen (TYLENOL 8 HOUR) 650 MG CR tablet Take 1 tablet (650 mg total) by mouth every 8 (eight) hours as needed for pain or fever. 10/30/17   Varney Biles, MD  albuterol (VENTOLIN HFA) 108 (90 Base) MCG/ACT inhaler INHALE 2 PUFFS INTO THE LUNGS EVERY 6 HOURS AS NEEDED FOR WHEEZING OR SHORTNESS OF BREATH 10/03/18   Curt Bears, MD  alendronate (FOSAMAX) 70 MG tablet Take 70 mg by mouth once a week. 03/08/18   [provider]  diphenhydrAMINE (BENADRYL) 50 MG tablet Take 1 tablet (50 mg total) by mouth as directed. Take one tablet po one hour prior to scan 05/04/20   Curt Bears, MD  gabapentin (NEURONTIN) 100 MG capsule Take 1 capsule (100 mg total) by mouth 3 (three) times daily. Take 1 tablet once a day 05/16/18   Heilingoetter, Cassandra L, PA-C  guaifenesin (ROBITUSSIN) 100 MG/5ML syrup Take 200 mg by mouth 3 (three) times daily as needed for cough.     [provider]  HYDROcodone-acetaminophen  (NORCO/VICODIN) 5-325 MG tablet TK 1-2 TS PO Q 6 H PRF MODERATE PAIN 08/13/18   [provider]  Menthol-Ascorbic Acid (LUDENS COUGH DROPS MT) Use as directed 1 lozenge in the mouth or throat 3 (three) times daily as needed (cough).     [provider]  Multiple Vitamin (MULTIVITAMIN WITH MINERALS) TABS tablet Take 1 tablet by mouth daily.    [provider]  nicotine (NICODERM CQ - DOSED IN MG/24 HR) 7 mg/24hr patch Place 1 patch (7 mg total) onto the skin daily. 05/10/20   Heilingoetter, Cassandra L, PA-C  nicotine polacrilex (COMMIT) 2 MG lozenge Take 1 lozenge (2 mg total) by mouth as needed for smoking cessation. 05/10/20   Heilingoetter, Cassandra L, PA-C  predniSONE (DELTASONE) 50 MG tablet TAKE 1 TABLET 13 HOURS BEFORE, THEN 7 HOURS BEFORE, THEN 1 HOUR BEFORE CT SCAN 11/04/20   Heilingoetter, Cassandra L, PA-C  prochlorperazine (COMPAZINE) 10 MG tablet Take 1 tablet (10 mg total) by mouth every 6 (six) hours as needed for nausea or vomiting. 04/18/18   Heilingoetter, Cassandra L, PA-C      Allergies    Omnipaque [iohexol] and Penicillins    Review of Systems   Review of Systems  Respiratory:  Negative for shortness of breath.   Cardiovascular:  Negative for chest pain.  Gastrointestinal:  Negative for nausea and vomiting.  Neurological:  Positive for syncope. Negative for dizziness and light-headedness.  All other systems reviewed and are negative.   Physical Exam Updated Vital Signs BP 109/71   Pulse (!) 101   Temp 97.7 F (36.5 C) (Oral)   Resp (!) 22   Ht 5' (1.524 m)   Wt 31.8 kg   SpO2 100%   BMI 13.67 kg/m  Physical Exam Vitals and nursing note reviewed.  Constitutional:      General: She is not in acute distress.    Appearance: She is not diaphoretic.     Comments: Nasal cannula in place, 6 L.  HENT:     Head: Normocephalic and atraumatic.     Mouth/Throat:     Pharynx: No oropharyngeal exudate.  Eyes:     General: No scleral icterus.     Conjunctiva/sclera: Conjunctivae normal.  Neck:     Comments: C-collar in place Cardiovascular:     Rate and Rhythm: Normal rate and regular rhythm.     Pulses: Normal pulses.     Heart sounds: Normal heart sounds.  Pulmonary:     Effort: Pulmonary effort is normal. No respiratory distress.     Breath sounds: Wheezing present.  Abdominal:     General: Bowel sounds are normal.     Palpations: Abdomen is soft. There is no mass.     Tenderness: There is no abdominal tenderness. There is no guarding or rebound.  Musculoskeletal:        General: Normal range of motion.     Cervical back: Normal range of motion and neck supple.  Skin:    General: Skin is warm and dry.  Neurological:     Mental Status: She is alert.  Psychiatric:        Behavior: Behavior normal.     ED Results / Procedures / Treatments   Labs (all labs ordered are listed, but only abnormal results are displayed) Labs Reviewed  COMPREHENSIVE METABOLIC PANEL - Abnormal; Notable for the following components:      Result Value   Glucose, Bld 129 (*)    BUN 29 (*)    Creatinine, Ser 1.23 (*)    Calcium 8.2 (*)    Total Protein 5.6 (*)    Albumin 2.9 (*)    GFR, Estimated 46 (*)    All other components within normal limits  CBC WITH DIFFERENTIAL/PLATELET - Abnormal; Notable for the following components:   MCHC 29.8 (*)    All other components within normal limits  I-STAT VENOUS BLOOD GAS, ED - Abnormal; Notable for the following components:   pH, Ven 7.210 (*)    pCO2, Ven 76.7 (*)    pO2, Ven 80 (*)    Bicarbonate 30.7 (*)    TCO2 33 (*)    Calcium, Ion 1.10 (*)    All other components within normal limits  CBG MONITORING, ED - Abnormal; Notable for the following components:   Glucose-Capillary 117 (*)    All other components within normal limits  TROPONIN I (HIGH SENSITIVITY) - Abnormal; Notable for the following components:   Troponin I (High Sensitivity) 80 (*)    All other components within normal  limits  URINALYSIS, ROUTINE W REFLEX MICROSCOPIC  TROPONIN I (HIGH SENSITIVITY)    EKG EKG Interpretation  Date/Time:  Monday June 20 2021 20:50:32 EDT Ventricular Rate:  96 PR Interval:  114 QRS Duration: 74 QT Interval:  352 QTC Calculation: 444 R Axis:   90 Text Interpretation: Normal sinus rhythm Rightward axis Nonspecific ST and T wave abnormality Abnormal ECG When  compared with ECG of 16-Sep-2017 19:18, PREVIOUS ECG IS PRESENT Confirmed by Dene Gentry [phone]) on 06/20/2021 9:24:51 PM  Radiology DG Chest 2 View  Result Date: 06/20/2021 CLINICAL DATA:  Fall and syncope EXAM: CHEST - 2 VIEW COMPARISON:  01/25/2018, CT chest 05/05/2020 FINDINGS: Advanced emphysema. Small bilateral pleural effusions which appear slightly loculated. Distortion in the left infrahilar lung corresponding to post therapy changes on recent CT. New left suprahilar somewhat nodular opacity. Stable cardiomediastinal silhouette. Vascular congestion and diffuse interstitial opacity which could be due to mild edema. IMPRESSION: 1. Increased small bilateral pleural effusion which appears slightly loculated. Diffuse increased interstitial opacity which may be due to mild edema 2. Emphysema with similar left infrahilar distortion and consolidation. New somewhat nodular opacity in the left suprahilar lung which could be due to a focus of pneumonia or possibly a lung nodule. Correlation with CT is suggested. Electronically Signed   By: Donavan Foil M.D.   On: 06/20/2021 21:46    Procedures Procedures    Medications Ordered in ED Medications - No data to display  ED Course/ Medical Decision Making/ A&P Clinical Course as of 06/21/21 0009  Mon Jun 20, 2021  2231 Discussed with patient in length regarding CTA chest and premedication for her contrast allergy. Pt declines at this time. Discussed with patient in length regarding CTA and need to rule out PE, PNA, lung nodule. Pt declines at this time. Pt also declines CT  chest wo contrast.  [SB]    Clinical Course User Index [SB] Keirsten Matuska A, PA-C                           Medical Decision Making Amount and/or Complexity of Data Reviewed Labs: ordered. Radiology: ordered.   Pt with syncopal episode occurring PTA.  Pt was at [place], felt nauseous, then cold, and then went to the bathroom where she passed out. Patient currently on 6 L of oxygen via nasal cannula, attempted to place patient on nonrebreather upon initial evaluation however patient unable to tolerate nonrebreather. On exam, patient with c-collar in place, wheezing noted. No acute cardiovascular or abdominal exam findings. Differential diagnosis includes electrolyte abnormality, intracranial abnormality, ACS, PE, pneumonia.    Labs:  I ordered, and personally interpreted labs.  The pertinent results include:   CBG at 117 CMP with slightly elevated glucose at 129, BUN and creatinine elevated at 1.23 and 29, otherwise unremarkable CBC overall unremarkable Initial troponin at 80, patient without chest pain or shortness of breath at this time.  Delta troponin ordered with results pending at time of signout  VBG notable for PCO2 of elevated at 76.7, PO2 slightly elevated at 80, bicarb elevated at 30.7.  Patient mentating well, no difficulty with breathing at this time, oxygen saturation remaining at 100% on 6 L via nasal cannula.  Discussed with attending regarding VBG findings.  Urinalysis ordered with results pending at time of signout.  Imaging: I ordered imaging studies including CXR, CT head, CT cervical spine I independently visualized and interpreted imaging which showed: Chest x-ray:  1. Increased small bilateral pleural effusion which appears slightly  loculated. Diffuse increased interstitial opacity which may be due  to mild edema  2. Emphysema with similar left infrahilar distortion and  consolidation. New somewhat nodular opacity in the left suprahilar  lung which could be due  to a focus of pneumonia or possibly a lung  nodule.    CT head and CT cervical spine  ordered with results pending at time of signout:  I agree with the radiologist interpretation   Patient case discussed with Deno Etienne, PA-C at sign-out. Plan at sign-out is pending CT scans, likely breathing treatments and possibly Bipap.  Patient will likely be admitted to the hospital. Patient care transferred at sign out.   This chart was dictated using voice recognition software, Dragon. Despite the best efforts of this provider to proofread and correct errors, errors may still occur which can change documentation meaning.  Final Clinical Impression(s) / ED Diagnoses Final diagnoses:  Fall, initial encounter  Syncope, unspecified syncope type    Rx / DC Orders ED Discharge Orders     None         Kassey Laforest A, PA-C 06/21/21 0017    Valarie Merino, MD 06/21/21 [id number]

## 2021-06-20 NOTE — ED Triage Notes (Signed)
Pt BIB [facility] for a syncopal episode and fall in the [employer] bathroom. Estimated 10 minutes of LOC per bystanders. Hx of lung cancer, -thinners.  Pulse 115, GCS 845, 36'I systolic > 680 systolic after 321YY, CBG 139.

## 2021-06-20 NOTE — ED Notes (Signed)
Patient transported to CT 

## 2021-06-21 ENCOUNTER — Emergency Department (HOSPITAL_COMMUNITY): Payer: Medicare Other

## 2021-06-21 ENCOUNTER — Inpatient Hospital Stay (HOSPITAL_COMMUNITY): Payer: Medicare Other

## 2021-06-21 ENCOUNTER — Telehealth: Payer: Self-pay | Admitting: Physician Assistant

## 2021-06-21 ENCOUNTER — Other Ambulatory Visit: Payer: Self-pay

## 2021-06-21 ENCOUNTER — Encounter (HOSPITAL_COMMUNITY): Payer: Self-pay | Admitting: Internal Medicine

## 2021-06-21 DIAGNOSIS — C3492 Malignant neoplasm of unspecified part of left bronchus or lung: Secondary | ICD-10-CM | POA: Diagnosis present

## 2021-06-21 DIAGNOSIS — J432 Centrilobular emphysema: Secondary | ICD-10-CM | POA: Diagnosis present

## 2021-06-21 DIAGNOSIS — R64 Cachexia: Secondary | ICD-10-CM | POA: Diagnosis present

## 2021-06-21 DIAGNOSIS — I340 Nonrheumatic mitral (valve) insufficiency: Secondary | ICD-10-CM | POA: Diagnosis not present

## 2021-06-21 DIAGNOSIS — R0602 Shortness of breath: Secondary | ICD-10-CM | POA: Diagnosis present

## 2021-06-21 DIAGNOSIS — E43 Unspecified severe protein-calorie malnutrition: Secondary | ICD-10-CM | POA: Diagnosis present

## 2021-06-21 DIAGNOSIS — J9602 Acute respiratory failure with hypercapnia: Secondary | ICD-10-CM | POA: Diagnosis present

## 2021-06-21 DIAGNOSIS — J441 Chronic obstructive pulmonary disease with (acute) exacerbation: Secondary | ICD-10-CM | POA: Diagnosis not present

## 2021-06-21 DIAGNOSIS — R0609 Other forms of dyspnea: Secondary | ICD-10-CM

## 2021-06-21 DIAGNOSIS — Z9221 Personal history of antineoplastic chemotherapy: Secondary | ICD-10-CM | POA: Diagnosis not present

## 2021-06-21 DIAGNOSIS — R55 Syncope and collapse: Secondary | ICD-10-CM

## 2021-06-21 DIAGNOSIS — F17200 Nicotine dependence, unspecified, uncomplicated: Secondary | ICD-10-CM | POA: Diagnosis present

## 2021-06-21 DIAGNOSIS — R54 Age-related physical debility: Secondary | ICD-10-CM | POA: Diagnosis present

## 2021-06-21 DIAGNOSIS — F32A Depression, unspecified: Secondary | ICD-10-CM | POA: Diagnosis present

## 2021-06-21 DIAGNOSIS — J9600 Acute respiratory failure, unspecified whether with hypoxia or hypercapnia: Secondary | ICD-10-CM | POA: Insufficient documentation

## 2021-06-21 DIAGNOSIS — J9601 Acute respiratory failure with hypoxia: Secondary | ICD-10-CM

## 2021-06-21 DIAGNOSIS — Z681 Body mass index (BMI) 19 or less, adult: Secondary | ICD-10-CM | POA: Diagnosis not present

## 2021-06-21 DIAGNOSIS — Z833 Family history of diabetes mellitus: Secondary | ICD-10-CM | POA: Diagnosis not present

## 2021-06-21 DIAGNOSIS — Z79899 Other long term (current) drug therapy: Secondary | ICD-10-CM | POA: Diagnosis not present

## 2021-06-21 DIAGNOSIS — N1831 Chronic kidney disease, stage 3a: Secondary | ICD-10-CM | POA: Diagnosis present

## 2021-06-21 DIAGNOSIS — F419 Anxiety disorder, unspecified: Secondary | ICD-10-CM | POA: Diagnosis present

## 2021-06-21 DIAGNOSIS — Z20822 Contact with and (suspected) exposure to covid-19: Secondary | ICD-10-CM | POA: Diagnosis present

## 2021-06-21 DIAGNOSIS — Z66 Do not resuscitate: Secondary | ICD-10-CM | POA: Diagnosis present

## 2021-06-21 DIAGNOSIS — F17210 Nicotine dependence, cigarettes, uncomplicated: Secondary | ICD-10-CM | POA: Diagnosis present

## 2021-06-21 DIAGNOSIS — I5033 Acute on chronic diastolic (congestive) heart failure: Secondary | ICD-10-CM | POA: Diagnosis present

## 2021-06-21 DIAGNOSIS — Z923 Personal history of irradiation: Secondary | ICD-10-CM | POA: Diagnosis not present

## 2021-06-21 LAB — RESPIRATORY PANEL BY PCR

## 2021-06-21 LAB — I-STAT ARTERIAL BLOOD GAS, ED
Acid-Base Excess: 4 mmol/L — ABNORMAL HIGH (ref 0.0–2.0)
Bicarbonate: 32.2 mmol/L — ABNORMAL HIGH (ref 20.0–28.0)
Calcium, Ion: 1.19 mmol/L (ref 1.15–1.40)
HCT: 37 % (ref 36.0–46.0)
Hemoglobin: 12.6 g/dL (ref 12.0–15.0)
O2 Saturation: 98 %
Potassium: 3.9 mmol/L (ref 3.5–5.1)
Sodium: 142 mmol/L (ref 135–145)
TCO2: 34 mmol/L — ABNORMAL HIGH (ref 22–32)
pCO2 arterial: 62.8 mmHg — ABNORMAL HIGH (ref 32–48)
pH, Arterial: 7.317 — ABNORMAL LOW (ref 7.35–7.45)
pO2, Arterial: 110 mmHg — ABNORMAL HIGH (ref 83–108)

## 2021-06-21 LAB — ECHOCARDIOGRAM COMPLETE
Area-P 1/2: 4.93 cm2
Height: 60 in
MV M vel: 4.25 m/s
MV Peak grad: 72.3 mmHg
Radius: 0.55 cm
S' Lateral: 2.1 cm
Weight: 1120 oz

## 2021-06-21 LAB — TROPONIN I (HIGH SENSITIVITY): Troponin I (High Sensitivity): 93 ng/L — ABNORMAL HIGH (ref <18)

## 2021-06-21 LAB — MRSA NEXT GEN BY PCR, NASAL: MRSA by PCR Next Gen: NOT DETECTED

## 2021-06-21 LAB — HIV ANTIBODY (ROUTINE TESTING W REFLEX): HIV Screen 4th Generation wRfx: NONREACTIVE

## 2021-06-21 LAB — BRAIN NATRIURETIC PEPTIDE: B Natriuretic Peptide: 1691.3 pg/mL — ABNORMAL HIGH (ref 0.0–100.0)

## 2021-06-21 MED ORDER — METHYLPREDNISOLONE SODIUM SUCC 40 MG IJ SOLR
40.0000 mg | Freq: Once | INTRAMUSCULAR | Status: AC
  Administered 2021-06-21: 40 mg via INTRAVENOUS
  Filled 2021-06-21: qty 1

## 2021-06-21 MED ORDER — IPRATROPIUM-ALBUTEROL 0.5-2.5 (3) MG/3ML IN SOLN
3.0000 mL | Freq: Four times a day (QID) | RESPIRATORY_TRACT | Status: DC
  Administered 2021-06-21 – 2021-06-22 (×4): 3 mL via RESPIRATORY_TRACT
  Filled 2021-06-21 (×4): qty 3

## 2021-06-21 MED ORDER — HEPARIN SODIUM (PORCINE) 5000 UNIT/ML IJ SOLN
5000.0000 [IU] | Freq: Three times a day (TID) | INTRAMUSCULAR | Status: DC
Start: 2021-06-21 — End: 2021-06-23
  Administered 2021-06-21 – 2021-06-23 (×6): 5000 [IU] via SUBCUTANEOUS
  Filled 2021-06-21 (×6): qty 1

## 2021-06-21 MED ORDER — ALBUTEROL SULFATE (2.5 MG/3ML) 0.083% IN NEBU: 2.5000 mg | INHALATION_SOLUTION | RESPIRATORY_TRACT | Status: DC | PRN

## 2021-06-21 MED ORDER — ONDANSETRON HCL 4 MG/2ML IJ SOLN: 4.0000 mg | Freq: Four times a day (QID) | INTRAMUSCULAR | Status: DC | PRN

## 2021-06-21 MED ORDER — IOHEXOL 350 MG/ML SOLN
56.0000 mL | Freq: Once | INTRAVENOUS | Status: AC | PRN
  Administered 2021-06-21: 56 mL via INTRAVENOUS

## 2021-06-21 MED ORDER — SODIUM CHLORIDE 0.9% FLUSH
3.0000 mL | Freq: Two times a day (BID) | INTRAVENOUS | Status: DC
  Administered 2021-06-21 – 2021-06-23 (×5): 3 mL via INTRAVENOUS

## 2021-06-21 MED ORDER — ONDANSETRON HCL 4 MG PO TABS: 4.0000 mg | ORAL_TABLET | Freq: Four times a day (QID) | ORAL | Status: DC | PRN

## 2021-06-21 MED ORDER — ALBUTEROL SULFATE (2.5 MG/3ML) 0.083% IN NEBU
5.0000 mg/h | INHALATION_SOLUTION | Freq: Once | RESPIRATORY_TRACT | Status: AC
  Administered 2021-06-21: 5 mg/h via RESPIRATORY_TRACT
  Filled 2021-06-21: qty 6

## 2021-06-21 MED ORDER — HYDROCODONE-ACETAMINOPHEN 5-325 MG PO TABS: 1.0000 | ORAL_TABLET | Freq: Four times a day (QID) | ORAL | Status: DC | PRN

## 2021-06-21 MED ORDER — ACETAMINOPHEN 325 MG PO TABS: 650.0000 mg | ORAL_TABLET | Freq: Four times a day (QID) | ORAL | Status: DC | PRN

## 2021-06-21 MED ORDER — DIPHENHYDRAMINE HCL 50 MG/ML IJ SOLN: 50.0000 mg | Freq: Once | INTRAMUSCULAR | Status: AC

## 2021-06-21 MED ORDER — METHYLPREDNISOLONE SODIUM SUCC 125 MG IJ SOLR
80.0000 mg | Freq: Once | INTRAMUSCULAR | Status: AC
  Administered 2021-06-21: 80 mg via INTRAVENOUS
  Filled 2021-06-21: qty 2

## 2021-06-21 MED ORDER — NICOTINE 14 MG/24HR TD PT24
14.0000 mg | MEDICATED_PATCH | Freq: Every day | TRANSDERMAL | Status: DC
Start: 2021-06-21 — End: 2021-06-23
  Administered 2021-06-21 – 2021-06-23 (×3): 14 mg via TRANSDERMAL
  Filled 2021-06-21 (×3): qty 1

## 2021-06-21 MED ORDER — ENOXAPARIN SODIUM 40 MG/0.4ML IJ SOSY: 40.0000 mg | PREFILLED_SYRINGE | INTRAMUSCULAR | Status: DC

## 2021-06-21 MED ORDER — METHYLPREDNISOLONE SODIUM SUCC 40 MG IJ SOLR
40.0000 mg | Freq: Two times a day (BID) | INTRAMUSCULAR | Status: DC
  Administered 2021-06-21 – 2021-06-23 (×4): 40 mg via INTRAVENOUS
  Filled 2021-06-21 (×4): qty 1

## 2021-06-21 MED ORDER — ENSURE ENLIVE PO LIQD
237.0000 mL | Freq: Two times a day (BID) | ORAL | Status: DC
  Administered 2021-06-22 (×2): 237 mL via ORAL

## 2021-06-21 MED ORDER — METHYLPREDNISOLONE SODIUM SUCC 40 MG IJ SOLR: 40.0000 mg | Freq: Every day | INTRAMUSCULAR | Status: DC

## 2021-06-21 MED ORDER — DIPHENHYDRAMINE HCL 25 MG PO CAPS
50.0000 mg | ORAL_CAPSULE | Freq: Once | ORAL | Status: AC
  Administered 2021-06-21: 50 mg via ORAL
  Filled 2021-06-21: qty 2

## 2021-06-21 MED ORDER — LORAZEPAM 2 MG/ML IJ SOLN: 0.5000 mg | Freq: Once | INTRAMUSCULAR | Status: DC

## 2021-06-21 MED ORDER — ACETAMINOPHEN 650 MG RE SUPP: 650.0000 mg | Freq: Four times a day (QID) | RECTAL | Status: DC | PRN

## 2021-06-21 NOTE — ED Notes (Signed)
Pt received breakfast tray 

## 2021-06-21 NOTE — TOC Progression Note (Signed)
Transition of Care [facility]) - Progression Note    Patient Details  Name: Sherry [name] MRN: [id number] Date of Birth: December 15, 1945  Transition of [facility]) CM/SW [place], RN Phone Number:[phone]  06/21/2021, 4:40 PM  Clinical Narrative:    Patient admitted with history of non-small cell lung cancer, COPD presenting to the ED after a syncopal episode. Currently there are no TOC needs noted.   Transition of Care [facility]) Screening Note   Patient Details  Name: [name] Date of Birth: 01-04-1946   Transition of Care [facility]) CM/SW Contact:    Angelita Ingles, RN Phone Number: 06/21/2021, 4:43 PM    Transition of Care Department [facility]) has reviewed patient and no TOC needs have been identified at this time. We will continue to monitor patient advancement through interdisciplinary progression rounds. If new patient transition needs arise, please place a TOC consult.          Expected Discharge Plan and Services                                                 Social Determinants of Health (SDOH) Interventions    Readmission Risk Interventions     No data to display

## 2021-06-21 NOTE — Progress Notes (Signed)
RT NOTE:  Pt refusing to wear BIPAP. MD made aware

## 2021-06-21 NOTE — Telephone Encounter (Signed)
Patient is scheduled for HFU on 06/27/2021 with Dr. Silas Flood- appointment reminder mailed to address on file-nothing further needed.

## 2021-06-21 NOTE — Consult Note (Addendum)
NAME:  NAJMAH CARRADINE, MRN:  [id number], DOB:  01/21/1945, LOS: 0 ADMISSION DATE:  06/20/2021, CONSULTATION DATE:  06/21/21 REFERRING MD:  Lorin Mercy CHIEF COMPLAINT:  Dyspnea   History of Present Illness:  DINNA SEVERS is a 76 y.o. female who has a PMH as below including but not limited to stage IIb/IIIa (T3, N0/N1, M0) NSCLC diagnosed August 2019 s/p chemo, XRT, and immunotherapy (followed by Dr. Julien Nordmann, currently on observation and was supposed to have f/u CT chest Nov 2022 but never did), emphysema, ongoing tobacco dependence.  She presented to [facility] ED 6/12 after syncopal episode at [facility]. She apparently lost consciousness for 15 minutes  In ED, she was slightly hypoxic and required 1 - 3L O2. CTA chest was negative for PE but showed atelectasis, chronic fibrosis, bilateral pleural effusions. She was initially acidotic and hypercapnic and BiPAP was ordered which she refused. Repeat ABG AM 6/13 showed improvement with normal pH but remained hypercapnic to 60s. CT head and C-spine were negative.  PCCM called for pulmonary consult 6/13. She denies any recent illness. Might have had a fever a week ago but doesn't know for sure. Has had chronic mild cough productive of white sputum, no changes. Denies chills/sweats, N/V/D, abd pain, myalgias, exposures to known sick contacts.  Of note, she does have DNR on file and confirms these wishes.  Pertinent  Medical History:  has Mediastinal mass; Primary malignant neoplasm of bronchus of left lower lobe ([place]); Pneumothorax; Encounter for antineoplastic chemotherapy; Goals of care, counseling/discussion; Malnutrition ([place]); Angular cheilitis; Cellulitis of right knee; Acute right-sided thoracic back pain; Thoracic compression fracture ([place]); Osteoporosis; Depressive disorder; Migraine; Chronic airway obstruction ([place]); Stage III squamous cell carcinoma of left lung ([place]); Encounter for antineoplastic immunotherapy; Constipation; and Acute respiratory failure ([place])  on their problem list.  Significant Hospital Events: Including procedures, antibiotic start and stop dates in addition to other pertinent events   6/13 admit  Interim History / Subjective:  Comfortable on 1L O2.  Has some diffuse wheezing. Feels better today.  Objective:  Blood pressure 92/80, pulse 90, temperature 98.6 F (37 C), temperature source Oral, resp. rate 13, height 5' (1.524 m), weight 31.8 kg, SpO2 100 %.    FiO2 (%):  [24 %] 24 %  No intake or output data in the 24 hours ending 06/21/21 1245 Filed Weights   06/20/21 2045  Weight: 31.8 kg    Examination: General: Adult female, frail, cachectic, resting in bed, in NAD. Neuro: A&O x 3, no deficits. HEENT: [place]/AT. Sclerae anicteric. EOMI. Cardiovascular: RRR, no M/R/G.  Lungs: Respirations even and unlabored.  Expiratory wheezes throughout. Abdomen: BS x 4, soft, NT/ND.  Musculoskeletal: Muscle wasting throughout. No gross deformities, no edema.  Skin: Intact, warm, no rashes.  Labs/imaging personally reviewed:  CTA chest 6/13 > negative for PE but showed atelectasis, chronic fibrosis, bilateral pleural effusions CT head / C-spine 6/13 > neg.  Assessment & Plan:   Emphysema with ongoing tobacco dependence. - BD's. - Solumedrol. - Nicotine patch. - Tobacco cessation counseling, she says she is quitting for good now. - Needs to establish with our office for ongoing care, will send message to office to help arrange.  Acute hypoxic and hypercapnic respiratory failure - 2/2 above. - Continue supplemental O2 as needed to maintain SpO2 > 88-90%, do not aim for higher. - Not interested in wearing BiPAP (does not need at this time however). - Bronchial hygiene. - Check RVP.  Hx NSCLC s/p chemo, XRT, immunotherapy (followed  by Dr. Julien Nordmann). - F/u as outpatient.   Goals of care: Continue supportive care. DNR confirmed with pt.  Rest per primary team.   Best practice (evaluated daily):  Per primary  team.  Labs   CBC: Recent Labs  Lab 06/20/21 1707 06/20/21 2140 06/21/21 0756  WBC 5.6  --   --   NEUTROABS 4.4  --   --   HGB 12.2 12.9 12.6  HCT 41.0 38.0 37.0  MCV 96.0  --   --   PLT 153  --   --     Basic Metabolic Panel: Recent Labs  Lab 06/20/21 1707 06/20/21 2140 06/21/21 0756  NA 142 144 142  K 3.9 3.8 3.9  CL 106  --   --   CO2 28  --   --   GLUCOSE 129*  --   --   BUN 29*  --   --   CREATININE 1.23*  --   --   CALCIUM 8.2*  --   --    GFR: Estimated Creatinine Clearance: 19.5 mL/min (A) (by C-G formula based on SCr of 1.23 mg/dL (H)). Recent Labs  Lab 06/20/21 1707  WBC 5.6    Liver Function Tests: Recent Labs  Lab 06/20/21 1707  AST 27  ALT 13  ALKPHOS 59  BILITOT 0.5  PROT 5.6*  ALBUMIN 2.9*   No results for input(s): "LIPASE", "AMYLASE" in the last 168 hours. No results for input(s): "AMMONIA" in the last 168 hours.  ABG    Component Value Date/Time   PHART 7.317 (L) 06/21/2021 0756   PCO2ART 62.8 (H) 06/21/2021 0756   PO2ART 110 (H) 06/21/2021 0756   HCO3 32.2 (H) 06/21/2021 0756   TCO2 34 (H) 06/21/2021 0756   O2SAT 98 06/21/2021 0756     Coagulation Profile: No results for input(s): "INR", "PROTIME" in the last 168 hours.  Cardiac Enzymes: No results for input(s): "CKTOTAL", "CKMB", "CKMBINDEX", "TROPONINI" in the last 168 hours.  HbA1C: No results found for: "HGBA1C"  CBG: Recent Labs  Lab 06/20/21 2113  GLUCAP 117*    Review of Systems:   All negative; except for those that are bolded, which indicate positives.  Constitutional: weight loss, weight gain, night sweats, fevers, chills, fatigue, weakness.  HEENT: headaches, sore throat, sneezing, nasal congestion, post nasal drip, difficulty swallowing, tooth/dental problems, visual complaints, visual changes, ear aches. Neuro: difficulty with speech, weakness, numbness, ataxia. CV:  chest pain, orthopnea, PND, swelling in lower extremities, dizziness, palpitations,  syncope.  Resp: cough, hemoptysis, dyspnea, wheezing. GI: heartburn, indigestion, abdominal pain, nausea, vomiting, diarrhea, constipation, change in bowel habits, loss of appetite, hematemesis, melena, hematochezia.  GU: dysuria, change in color of urine, urgency or frequency, flank pain, hematuria. MSK: joint pain or swelling, decreased range of motion. Psych: change in mood or affect, depression, anxiety, suicidal ideations, homicidal ideations. Skin: rash, itching, bruising.   Past Medical History:  She,  has a past medical history of Allergy, Anxiety, Arthritis, Cancer ([place]), Cavitating mass in left lower lung lobe, Complication of anesthesia, COPD (chronic obstructive pulmonary disease) ([place]), Depression, Family history of adverse reaction to anesthesia, Full dentures, Headache, Neuromuscular disorder ([place]), Pneumonia, Thoracic spine fracture ([place]) (11/01/2017), and Wears glasses.   Surgical History:   Past Surgical History:  Procedure Laterality Date   ELBOW SURGERY     for  "Tennis Elbow"   hand surgery     IR KYPHO THORACIC WITH BONE BIOPSY  11/27/2017   IR KYPHO THORACIC WITH BONE  BIOPSY  03/01/2018   IR KYPHO THORACIC WITH BONE BIOPSY  03/26/2018   IR RADIOLOGIST EVAL & MGMT  11/20/2017   IR RADIOLOGIST EVAL & MGMT  02/26/2018   IR RADIOLOGIST EVAL & MGMT  03/13/2018   MULTIPLE TOOTH EXTRACTIONS     TUBAL LIGATION     VIDEO BRONCHOSCOPY N/A 08/31/2017   Procedure: VIDEO BRONCHOSCOPY;  Surgeon: Melrose Nakayama, MD;  Location: [facility] OR;  Service: Thoracic;  Laterality: N/A;     Social History:   reports that she has been smoking cigarettes. She has a 50.00 pack-year smoking history. She has never used smokeless tobacco. She reports that she does not drink alcohol and does not use drugs.   Family History:  Her family history includes Diabetes in her mother.   Allergies Allergies  Allergen Reactions   Omnipaque [Iohexol] Hives and Itching    Patient had a late reaction of  itching and hives after scan will need premeds   Penicillins Hives    Has patient had a PCN reaction causing immediate rash, facial/tongue/throat swelling, SOB or lightheadedness with hypotension: no Has patient had a PCN reaction causing severe rash involving mucus membranes or skin necrosis: No Has patient had a PCN reaction that required hospitalization: No Has patient had a PCN reaction occurring within the last 10 years: No If all of the above answers are "NO", then may proceed with Cephalosporin use.      Home Medications  Prior to Admission medications   Medication Sig Start Date End Date Taking? Authorizing Provider  acetaminophen (TYLENOL 8 HOUR) 650 MG CR tablet Take 1 tablet (650 mg total) by mouth every 8 (eight) hours as needed for pain or fever. 10/30/17  Yes Varney Biles, MD  HYDROcodone-acetaminophen (NORCO/VICODIN) 5-325 MG tablet Take 1 tablet by mouth every 6 (six) hours as needed for moderate pain. 08/13/18  Yes [provider]  albuterol (VENTOLIN HFA) 108 (90 Base) MCG/ACT inhaler INHALE 2 PUFFS INTO THE LUNGS EVERY 6 HOURS AS NEEDED FOR WHEEZING OR SHORTNESS OF BREATH Patient not taking: Reported on 06/21/2021 10/03/18   Curt Bears, MD  nicotine (NICODERM CQ - DOSED IN MG/24 HR) 7 mg/24hr patch Place 1 patch (7 mg total) onto the skin daily. Patient not taking: Reported on 06/21/2021 05/10/20   Heilingoetter, Cassandra L, PA-C  predniSONE (DELTASONE) 50 MG tablet TAKE 1 TABLET 13 HOURS BEFORE, THEN 7 HOURS BEFORE, THEN 1 HOUR BEFORE CT SCAN Patient not taking: Reported on 06/21/2021 11/04/20   Heilingoetter, Cassandra L, PA-C     Critical care time: N/A.   Montey Hora, [facility] For pager details, please see AMION or use Epic chat  After 1900, please call [facility] for cross coverage needs 06/21/2021, 12:45 PM

## 2021-06-21 NOTE — Progress Notes (Signed)
Complaints of dizziness when standing, claimed she has vertigo.

## 2021-06-21 NOTE — ED Notes (Signed)
Echo at bedside

## 2021-06-21 NOTE — Progress Notes (Signed)
Hands are cold , pulse ox is unobtainable. Hands wrapped  with blanket , heat packs applied. Continue to monitor.

## 2021-06-21 NOTE — ED Provider Notes (Signed)
Received at shift change from Soijett blue  PA-C please see her note for full detail  In short patient with medical history including stage III squamous cell carcinoma currently not on chemotherapy presents with complaints of a syncopal episode.  Patient states that she was at [facility] started to feel hot sweaty and nauseous she went to the bathroom and collapsed.  She woke up with EMS around her, she states that she has no complaints at this time, she denies any cardiac history or history of PEs, she is currently not on hormone therapy, she endorses that she smokes about half a pack a day, she does not Dors any chest pain or shortness of breath, she denies URI-like symptoms, denies any fevers or chills.   Per previous provider patient should be admitted pending CT head and C-spine.   Physical Exam  BP 115/74   Pulse 88   Temp 97.7 F (36.5 C) (Oral)   Resp (!) 27   Ht 5' (1.524 m)   Wt 31.8 kg   SpO2 100%   BMI 13.67 kg/m   Physical Exam Vitals and nursing note reviewed.  Constitutional:      General: She is not in acute distress.    Appearance: She is not ill-appearing.     Comments: Chronically ill-appearing  HENT:     Head: Normocephalic and atraumatic.     Nose: No congestion.  Eyes:     Conjunctiva/sclera: Conjunctivae normal.  Cardiovascular:     Rate and Rhythm: Normal rate and regular rhythm.     Pulses: Normal pulses.     Heart sounds: No murmur heard.    No friction rub. No gallop.  Pulmonary:     Effort: No respiratory distress.     Breath sounds: Wheezing present. No rhonchi or rales.     Comments: No evidence of acute respiratory distress, she is nontachypneic able speak in full sentences, she is notably hypoxic during my exam was placed on 2 L via nasal cannula, O2 sat remained in the upper 90s, lung sounds have wheezing heard bilaterally no rales rhonchi or stridor present. Abdominal:     Palpations: Abdomen is soft.     Tenderness: There is no abdominal  tenderness. There is no right CVA tenderness or left CVA tenderness.  Musculoskeletal:     Right lower leg: No edema.     Left lower leg: No edema.  Skin:    General: Skin is warm and dry.  Neurological:     Mental Status: She is alert.  Psychiatric:        Mood and Affect: Mood normal.     Procedures  .Critical Care  Performed by: Marcello Fennel, PA-C Authorized by: Marcello Fennel, PA-C   Critical care provider statement:    Critical care time (minutes):  30   Critical care time was exclusive of:  Separately billable procedures and treating other patients   Critical care was necessary to treat or prevent imminent or life-threatening deterioration of the following conditions:  Respiratory failure   Critical care was time spent personally by me on the following activities:  Development of treatment plan with patient or surrogate, discussions with consultants, evaluation of patient's response to treatment, examination of patient, ordering and review of laboratory studies, ordering and review of radiographic studies, ordering and performing treatments and interventions, pulse oximetry, re-evaluation of patient's condition and review of old charts   Care discussed with: admitting provider     ED Course / MDM  Clinical Course as of 06/21/21 0713  Mon Jun 20, 2021  2231 Discussed with patient in length regarding CTA chest and premedication for her contrast allergy. Pt declines at this time. Discussed with patient in length regarding CTA and need to rule out PE, PNA, lung nodule. Pt declines at this time. Pt also declines CT chest wo contrast.  [SB]    Clinical Course User Index [SB] Blue, Soijett A, PA-C   Medical Decision Making Amount and/or Complexity of Data Reviewed Labs: ordered. Radiology: ordered.  Risk Prescription drug management. Decision regarding hospitalization.     Additional history obtained:  Additional history obtained from N/A External records  from outside source obtained and reviewed including previous oncology notes   Co morbidities that complicate the patient evaluation  Squamous of carcinoma  Social Determinants of Health:  N/A    Lab Tests:  I Ordered, and personally interpreted labs.  The pertinent results include: CBC unremarkable, CMP shows glucose of 129 BUN of 29 creatinine 1.23, calcium 8.2 GFR 46, iron sat VBG reveals PCO2 of 76 pH of 7.2 bicarb 30, first troponin is 80   Imaging Studies ordered:  I ordered imaging studies including CT head, C-spine, chest x-ray, CT of chest I independently visualized and interpreted imaging which showed CT head, C-spine both negative for acute findings, chest x-ray reveals slight bilateral pleural effusions, new nodules noted in the left upper lobe I agree with the radiologist interpretation   Cardiac Monitoring:  The patient was maintained on a cardiac monitor.  I personally viewed and interpreted the cardiac monitored which showed an underlying rhythm of: EKG without signs of ischemia   Medicines ordered and prescription drug management:  I ordered medication including Solu-Medrol, breathing treatment, Ativan I have reviewed the patients home medicines and have made adjustments as needed  Critical Interventions:  Patient is noted to be acidotic and is CO2 retaining, likely from acute respiratory failure, will place patient on BiPAP.   Reevaluation:  My initial evaluation patient after handoff reveals a chronically ill patient, she had noted wheezing present my exam, noted be hypoxic, she is now on 2 L via nasal cannula, will provide her with a breathing treatment, steroids, and place her on BiPAP due to her decreased pH as well as CO2 retaining.  Also send down for CT of chest to rule out of possible PE.  We will continue to monitor.  Patient is refusing BiPAP at this time, she is resting company, will continue with nasal cannula.  Updated patient on imaging,  will admit her to the hospitalist for further management she is agreement this plan.    Consultations Obtained:  I requested consultation with the hospitalist Dr. Marlowe Sax,  and discussed lab and imaging findings as well as pertinent plan - they recommend: She will admit the patient.    Test Considered:  N/A    Rule out I have low suspicion for ACS as history is atypical, patient has no cardiac history, EKG was sinus rhythm without signs of ischemia, she does have an elevated troponin but they have essentially plateaued, I suspect the elevation is from likely demand ischemia she has a new pleural effusion and increased labored breathing.  Low suspicion for PE or dissection as CT imaging is negative these findings.  I have low suspicion for pneumonia as she denies sputum production, she is afebrile, no leukocytosis. low suspicion for systemic infection as patient is nontoxic-appearing, vital signs reassuring, no obvious source infection noted on exam.  Dispostion and problem list  After consideration of the diagnostic results and the patients response to treatment, I feel that the patent would benefit from admission.  Acute respiratory failure-likely multifactorial, acute exacerbation of emphysema, currently receiving bronchodilators as well as steroids.  She has a new pleural effusion which I suspect is likely from the lung cancer,  may benefit from a thoracentesis.           Marcello Fennel, PA-C 06/21/21 [id number]    Fatima Blank, MD 06/21/21 [phone]

## 2021-06-21 NOTE — ED Notes (Signed)
Pt tolerating 2L [place] with an appropriate SPO2

## 2021-06-21 NOTE — Progress Notes (Signed)
Complaints of dizziness every time she stands up, unable to do orthostatic v/s. To try later again.

## 2021-06-21 NOTE — H&P (Signed)
History and Physical    Patient: Sherry [name] ZOX:[id number] DOB: 19-May-1945 DOA: 06/20/2021 DOS: the patient was seen and examined on 06/21/2021 PCP: [name], NP  Patient coming from: Home - lives alone; NOK: Corky Crafts, [phone]   Chief Complaint: Syncope  HPI: Sherry [name] is a 76 y.o. female with medical history significant of COPD and NSCLC presenting with syncope.  She was at [facility] and she got to the freezer section.  She was seeing multiple colors and felt nauseated.  She went to the bathroom and doesn't remember anything else.  EMS reported that she lost consciousness for 15 minutes.  No prior h/o similar.  For the last few weeks, her feet are swollen and her feet and legs have been jumping.  She was feeling well yesterday but she got very cold when she went in to the store.  No SOB.  No orthopnea.  +fever, maybe 2 weeks ago, 100.1, lasted for 1 day.  +cough, mild.  She was on chemo/rads for lung cancer, she went off for 6 months.  She was supposed to go back in November for CT and she didn't go.  She sees Dr. Julien Nordmann.    ER Course:  Carryover, per Dr. Marlowe Sax:  76 year old female with history of non-small cell lung cancer, COPD presenting to the ED after a syncopal event at [facility].  She denied any chest pain or respiratory symptoms.  Tachypneic on arrival and hypoxic, requiring 2 L O2.  Initial VBG showing pH 7.2, PCO2 76 and patient not able to tolerate BiPAP.  Troponin 80 >93.   CTA chest negative for PE. Showing progressed left lower lobe consolidation since last year, superimposed on post treatment pulmonary fibrosis. This might be  atelectasis but Pneumonia is not excluded. Constellation suspicious for a degree of pulmonary edema and right heart failure superimposed on chronic lung disease with Emphysema, including new moderate size right pleural effusion and increased partially loculated left pleural effusion.   No fever or leukocytosis.  She was given  bronchodilators and steroids.   I have requested ED PA to repeat blood gas.     Review of Systems: As mentioned in the history of present illness. All other systems reviewed and are negative. Past Medical History:  Diagnosis Date   Allergy    Anxiety    Arthritis    Cancer ([place])    Cavitating mass in left lower lung lobe    Complication of anesthesia    woke up during procedure   COPD (chronic obstructive pulmonary disease) ([place])    not on home O2   Depression    Family history of adverse reaction to anesthesia    " my mother was always hard to wake up"   Full dentures    Headache    Neuromuscular disorder ([place])    Pneumonia    Thoracic spine fracture ([place]) 11/01/2017   T9   Wears glasses    Past Surgical History:  Procedure Laterality Date   ELBOW SURGERY     for  "Tennis Elbow"   hand surgery     IR KYPHO THORACIC WITH BONE BIOPSY  11/27/2017   IR KYPHO THORACIC WITH BONE BIOPSY  03/01/2018   IR KYPHO THORACIC WITH BONE BIOPSY  03/26/2018   IR RADIOLOGIST EVAL & MGMT  11/20/2017   IR RADIOLOGIST EVAL & MGMT  02/26/2018   IR RADIOLOGIST EVAL & MGMT  03/13/2018   MULTIPLE TOOTH EXTRACTIONS     TUBAL LIGATION  VIDEO BRONCHOSCOPY N/A 08/31/2017   Procedure: VIDEO BRONCHOSCOPY;  Surgeon: Melrose Nakayama, MD;  Location: [facility] OR;  Service: Thoracic;  Laterality: N/A;   Social History:  reports that she has been smoking cigarettes. She has a 50.00 pack-year smoking history. She has never used smokeless tobacco. She reports that she does not drink alcohol and does not use drugs.  Allergies  Allergen Reactions   Omnipaque [Iohexol] Hives and Itching    Patient had a late reaction of itching and hives after scan will need premeds   Penicillins Hives    Has patient had a PCN reaction causing immediate rash, facial/tongue/throat swelling, SOB or lightheadedness with hypotension: no Has patient had a PCN reaction causing severe rash involving mucus membranes or skin  necrosis: No Has patient had a PCN reaction that required hospitalization: No Has patient had a PCN reaction occurring within the last 10 years: No If all of the above answers are "NO", then may proceed with Cephalosporin use.     Family History  Problem Relation Age of Onset   Diabetes Mother     Prior to Admission medications   Medication Sig Start Date End Date Taking? Authorizing Provider  acetaminophen (TYLENOL 8 HOUR) 650 MG CR tablet Take 1 tablet (650 mg total) by mouth every 8 (eight) hours as needed for pain or fever. 10/30/17  Yes Varney Biles, MD  HYDROcodone-acetaminophen (NORCO/VICODIN) 5-325 MG tablet Take 1 tablet by mouth every 6 (six) hours as needed for moderate pain. 08/13/18  Yes [provider]  albuterol (VENTOLIN HFA) 108 (90 Base) MCG/ACT inhaler INHALE 2 PUFFS INTO THE LUNGS EVERY 6 HOURS AS NEEDED FOR WHEEZING OR SHORTNESS OF BREATH Patient not taking: Reported on 06/21/2021 10/03/18   Curt Bears, MD  diphenhydrAMINE (BENADRYL) 50 MG tablet Take 1 tablet (50 mg total) by mouth as directed. Take one tablet po one hour prior to scan Patient not taking: Reported on 06/21/2021 05/04/20   Curt Bears, MD  gabapentin (NEURONTIN) 100 MG capsule Take 1 capsule (100 mg total) by mouth 3 (three) times daily. Take 1 tablet once a day Patient not taking: Reported on 06/21/2021 05/16/18   Heilingoetter, Cassandra L, PA-C  nicotine (NICODERM CQ - DOSED IN MG/24 HR) 7 mg/24hr patch Place 1 patch (7 mg total) onto the skin daily. Patient not taking: Reported on 06/21/2021 05/10/20   Heilingoetter, Cassandra L, PA-C  nicotine polacrilex (COMMIT) 2 MG lozenge Take 1 lozenge (2 mg total) by mouth as needed for smoking cessation. Patient not taking: Reported on 06/21/2021 05/10/20   Heilingoetter, Cassandra L, PA-C  predniSONE (DELTASONE) 50 MG tablet TAKE 1 TABLET 13 HOURS BEFORE, THEN 7 HOURS BEFORE, THEN 1 HOUR BEFORE CT SCAN Patient not taking: Reported on 06/21/2021  11/04/20   Heilingoetter, Cassandra L, PA-C  prochlorperazine (COMPAZINE) 10 MG tablet Take 1 tablet (10 mg total) by mouth every 6 (six) hours as needed for nausea or vomiting. Patient not taking: Reported on 06/21/2021 04/18/18   Heilingoetter, Cassandra L, PA-C    Physical Exam: Vitals:   06/21/21 1100 06/21/21 1304 06/21/21 1500 06/21/21 1600  BP: 92/80 116/81 98/69   Pulse: 90 92 99   Resp: 13 20 18    Temp:   98.5 F (36.9 C)   TempSrc:   Oral   SpO2: 100% 99% 96% 92%  Weight:      Height:       General:  Appears older than stated age, very frail and cachectic Eyes:  PERRL,  EOMI, normal lids, iris ENT:  grossly normal hearing, lips & tongue, mmm Neck:  no LAD, masses or thyromegaly Cardiovascular:  RRR, no m/r/g. No LE edema.  Respiratory:   CTA bilaterally with no wheezes/rales/rhonchi.  Normal respiratory effort.  On [place] O2 "for comfort" Abdomen:  soft, NT, ND Skin:  no rash or induration seen on limited exam Musculoskeletal:  grossly normal tone BUE/BLE, good ROM, no bony abnormality Psychiatric:  blunted mood and affect, speech fluent and appropriate, AOx3 Neurologic:  CN 2-12 grossly intact, moves all extremities in coordinated fashion   Radiological Exams on Admission: Independently reviewed - see discussion in A/P where applicable  ECHOCARDIOGRAM COMPLETE  Result Date: 06/21/2021    ECHOCARDIOGRAM REPORT   Patient Name:   JALIYA SIEGMANN Date of Exam: 06/21/2021 Medical Rec #:  [id number]      Height:       60.0 in Accession #:    [id number]     Weight:       70.0 lb Date of Birth:  01-16-1945      BSA:          1.195 m Patient Age:    38 years       BP:           116/81 mmHg Patient Gender: F              HR:           90 bpm. Exam Location:  Inpatient Procedure: 2D Echo, Color Doppler and Cardiac Doppler Indications:    R06.9 DOE  History:        Patient has no prior history of Echocardiogram examinations.                 COPD; Risk Factors:Lung Cancer.  Sonographer:     Raquel Sarna Senior RDCS Referring Phys: [id number] [place]  Sonographer Comments: Difficult windows due to very small body habitus IMPRESSIONS  1. Left ventricular ejection fraction, by estimation, is 55 to 60%. The left ventricle has normal function. The left ventricle has no regional wall motion abnormalities. Left ventricular diastolic parameters are consistent with Grade II diastolic dysfunction (pseudonormalization). Elevated left atrial pressure.  2. Right ventricular systolic function is mildly reduced. The right ventricular size is mildly enlarged. There is moderately elevated pulmonary artery systolic pressure. The estimated right ventricular systolic pressure is 40.9 mmHg.  3. The mitral valve is abnormal. Moderate to severe mitral valve regurgitation. No evidence of mitral stenosis. Posterior directed MR jet. Normal LA/LV size, low E wave velocity suggests likely moderate MR  4. Tricuspid valve regurgitation is mild to moderate.  5. The aortic valve is tricuspid. Aortic valve regurgitation is trivial. No aortic stenosis is present.  6. The inferior vena cava is dilated in size with <50% respiratory variability, suggesting right atrial pressure of 15 mmHg. FINDINGS  Left Ventricle: Left ventricular ejection fraction, by estimation, is 55 to 60%. The left ventricle has normal function. The left ventricle has no regional wall motion abnormalities. The left ventricular internal cavity size was small. There is no left ventricular hypertrophy. Left ventricular diastolic parameters are consistent with Grade II diastolic dysfunction (pseudonormalization). Elevated left atrial pressure. Right Ventricle: The right ventricular size is mildly enlarged. Right vetricular wall thickness was not well visualized. Right ventricular systolic function is mildly reduced. There is moderately elevated pulmonary artery systolic pressure. The tricuspid  regurgitant velocity is 3.13 m/s, and with an assumed right atrial pressure of 15  mmHg, the estimated  right ventricular systolic pressure is 65.4 mmHg. Left Atrium: Left atrial size was normal in size. Right Atrium: Right atrial size was normal in size. Pericardium: There is no evidence of pericardial effusion. Mitral Valve: The mitral valve is abnormal. Moderate to severe mitral valve regurgitation. No evidence of mitral valve stenosis. Tricuspid Valve: The tricuspid valve is normal in structure. Tricuspid valve regurgitation is mild to moderate. Aortic Valve: The aortic valve is tricuspid. Aortic valve regurgitation is trivial. No aortic stenosis is present. Pulmonic Valve: The pulmonic valve was not well visualized. Pulmonic valve regurgitation is not visualized. Aorta: The aortic root is normal in size and structure. Venous: The inferior vena cava is dilated in size with less than 50% respiratory variability, suggesting right atrial pressure of 15 mmHg. IAS/Shunts: The interatrial septum was not well visualized.  LEFT VENTRICLE PLAX 2D LVIDd:         3.10 cm   Diastology LVIDs:         2.10 cm   LV e' medial:    4.79 cm/s LV PW:         0.80 cm   LV E/e' medial:  19.5 LV IVS:        0.60 cm   LV e' lateral:   5.22 cm/s LVOT diam:     1.70 cm   LV E/e' lateral: 17.9 LV SV:         22 LV SV Index:   18 LVOT Area:     2.27 cm  RIGHT VENTRICLE RV S prime:     9.57 cm/s TAPSE (M-mode): 1.3 cm LEFT ATRIUM             Index        RIGHT ATRIUM          Index LA diam:        2.90 cm 2.43 cm/m   RA Area:     9.69 cm LA Vol (A2C):   30.3 ml 25.36 ml/m  RA Volume:   21.40 ml 17.91 ml/m LA Vol (A4C):   29.7 ml 24.86 ml/m LA Biplane Vol: 30.4 ml 25.44 ml/m  AORTIC VALVE LVOT Vmax:   63.80 cm/s LVOT Vmean:  47.400 cm/s LVOT VTI:    0.096 m  AORTA Ao Root diam: 3.00 cm MITRAL VALVE                  TRICUSPID VALVE MV Area (PHT): 4.93 cm       TR Peak grad:   39.2 mmHg MV Decel Time: 154 msec       TR Vmax:        313.00 cm/s MR Peak grad:    72.2 mmHg MR Mean grad:    50.0 mmHg    SHUNTS MR Vmax:          425.00 cm/s  Systemic VTI:  0.10 m MR Vmean:        329.0 cm/s   Systemic Diam: 1.70 cm MR PISA:         1.90 cm MR PISA Eff ROA: 16 mm MR PISA Radius:  0.55 cm MV E velocity: 93.60 cm/s MV A velocity: 79.60 cm/s MV E/A ratio:  1.18 Oswaldo Milian MD Electronically signed by Oswaldo Milian MD Signature Date/Time: 06/21/2021/3:11:35 PM    Final    CT Angio Chest PE W and/or Wo Contrast  Result Date: 06/21/2021 CLINICAL DATA:  76 year old female status post syncope and fall. History of lung cancer, left lower lobe radiation fibrosis,  emphysema. EXAM: CT ANGIOGRAPHY CHEST WITH CONTRAST TECHNIQUE: Multidetector CT imaging of the chest was performed using the standard protocol during bolus administration of intravenous contrast. Multiplanar CT image reconstructions and MIPs were obtained to evaluate the vascular anatomy. RADIATION DOSE REDUCTION: This exam was performed according to the departmental dose-optimization program which includes automated exposure control, adjustment of the mA and/or kV according to patient size and/or use of iterative reconstruction technique. CONTRAST:  22mL OMNIPAQUE IOHEXOL 350 MG/ML SOLN COMPARISON:  Restaging chest CT 05/05/2020. FINDINGS: Cardiovascular: Excellent contrast bolus timing in the pulmonary arterial tree. No focal filling defect identified in the pulmonary arteries to suggest acute pulmonary embolism. Calcified aortic atherosclerosis. Calcified coronary artery atherosclerosis. No cardiomegaly. No definite pericardial effusion. However, there is a small volume of contrast reflux into the hepatic IVC and hepatic veins. Mediastinum/Nodes: No definite mediastinal mass or lymphadenopathy. Lungs/Pleura: New moderate-sized right pleural effusion, mostly layering and with simple fluid density. Chronic but increased small left side pleural effusion, chronically somewhat loculated at the lung base (series 5, image 88). Major airways remain patent but there is  progressive consolidation of the left lower lobe from last year. Underlying centrilobular emphysema. Mild diffuse increased pulmonary interstitial density in both lungs. Mild compressive atelectasis also on the right. Upper Abdomen: Negative visible centrally noncontrast liver, spleen, pancreas, adrenal glands, kidneys, stomach in the upper abdomen. Musculoskeletal: Osteopenia. Chronic T9 through T12 compression fractures, some previously augmented. No acute or suspicious osseous lesion identified. Review of the MIP images confirms the above findings. IMPRESSION: 1. Negative for acute pulmonary embolus. 2. Progressed left lower lobe consolidation since last year, superimposed on post treatment pulmonary fibrosis. This might be atelectasis (see #3), but Pneumonia is not excluded. 3. Constellation suspicious for a degree of pulmonary edema and right heart failure superimposed on chronic lung disease with Emphysema (ICD10-J43.9), including new moderate size right pleural effusion and increased partially loculated left pleural effusion. 4. Calcified coronary artery and Aortic Atherosclerosis (ICD10-I70.0). Electronically Signed   By: Genevie Ann M.D.   On: 06/21/2021 06:22   CT Cervical Spine Wo Contrast  Result Date: 06/21/2021 CLINICAL DATA:  Neck trauma (Age >= 65y).  Syncope, fall EXAM: CT CERVICAL SPINE WITHOUT CONTRAST TECHNIQUE: Multidetector CT imaging of the cervical spine was performed without intravenous contrast. Multiplanar CT image reconstructions were also generated. RADIATION DOSE REDUCTION: This exam was performed according to the departmental dose-optimization program which includes automated exposure control, adjustment of the mA and/or kV according to patient size and/or use of iterative reconstruction technique. COMPARISON:  None Available. FINDINGS: Alignment: No subluxation Skull base and vertebrae: No acute fracture. No primary bone lesion or focal pathologic process. Soft tissues and spinal  canal: No prevertebral fluid or swelling. No visible canal hematoma. Disc levels: Diffuse degenerative disc disease with disc space narrowing and early spurring. Upper chest: Centrilobular emphysema. Bilateral pleural effusions partially imaged. Other: None IMPRESSION: No acute bony abnormality. Layering bilateral pleural effusions.  Emphysema. Electronically Signed   By: Rolm Baptise M.D.   On: 06/21/2021 00:14   CT Head Wo Contrast  Result Date: 06/21/2021 CLINICAL DATA:  Head trauma, minor (Age >= 65y).  Syncope, fall EXAM: CT HEAD WITHOUT CONTRAST TECHNIQUE: Contiguous axial images were obtained from the base of the skull through the vertex without intravenous contrast. RADIATION DOSE REDUCTION: This exam was performed according to the departmental dose-optimization program which includes automated exposure control, adjustment of the mA and/or kV according to patient size and/or use of iterative reconstruction technique. COMPARISON:  None Available. FINDINGS: Brain: No acute intracranial abnormality. Specifically, no hemorrhage, hydrocephalus, mass lesion, acute infarction, or significant intracranial injury. Vascular: No hyperdense vessel or unexpected calcification. Skull: No acute calvarial abnormality. Sinuses/Orbits: No acute findings Other: None IMPRESSION: No acute intracranial abnormality. Electronically Signed   By: Rolm Baptise M.D.   On: 06/21/2021 00:08   DG Chest 2 View  Result Date: 06/20/2021 CLINICAL DATA:  Fall and syncope EXAM: CHEST - 2 VIEW COMPARISON:  01/25/2018, CT chest 05/05/2020 FINDINGS: Advanced emphysema. Small bilateral pleural effusions which appear slightly loculated. Distortion in the left infrahilar lung corresponding to post therapy changes on recent CT. New left suprahilar somewhat nodular opacity. Stable cardiomediastinal silhouette. Vascular congestion and diffuse interstitial opacity which could be due to mild edema. IMPRESSION: 1. Increased small bilateral pleural  effusion which appears slightly loculated. Diffuse increased interstitial opacity which may be due to mild edema 2. Emphysema with similar left infrahilar distortion and consolidation. New somewhat nodular opacity in the left suprahilar lung which could be due to a focus of pneumonia or possibly a lung nodule. Correlation with CT is suggested. Electronically Signed   By: Donavan Foil M.D.   On: 06/20/2021 21:46    EKG: Independently reviewed.  NSR with rate 96; nonspecific ST changes with no evidence of acute ischemia   Labs on Admission: I have personally reviewed the available labs and imaging studies at the time of the admission.  Pertinent labs:    VBG: 7.21/76.7/30.7 Glucose 129 BUN 29/Creatinine 1.23/GFR 46 Albumin 2.9 HS troponin 80, 93 Normal CBC   Assessment and Plan: Principal Problem:   Syncope and collapse Active Problems:   Malnutrition (HCC)   COPD with acute exacerbation (HCC)   Stage III squamous cell carcinoma of left lung (HCC)   Tobacco dependence    Syncope -Etiology is not clear but appears likely to be related to her respiratory compromise - acute vs. chronic -This patient is at moderate/high risk for serious outcome and will be admitted on telemetry -Orthostatic vital signs now and in AM -Mildly elevated troponin with negative delta, no chest pain, low suspicion for ACS -2d echo is part of the extended work-up when cardiac etiology is suspected; will order given concern for R heart failure -Neuro checks  -PT/OT eval and treat  COPD with possible exacerbation -Patient has known COPD with h/o [place]  -She has not seen pulmonology -No home O2 -She does not have fever or leukocytosis but has bronchospasm, likely non-infectious COPD exacerbation  -Chest CTA with progressive LLL consolidation - infection vs. Scarring vs. atelectasis -She was hypotensive on arrival -will admit patient - with her complicated pulm history including CA, it seems likely that  she will need several days of hospitalization to show sufficient improvement for discharge. -Nebulizers: scheduled Duoneb and prn albuterol -Solu-Medrol 80 mg IV BID -> Prednisone 40 mg PO daily -No antibiotics for now -Pulm consult -Coordinated care with [facility] team/PT/OT/Nutrition/RT consults  [place] -Stage IIb/IIIa poorly differentiated NSCLC diagnosed in 08/2017 -Treated with concurrent chemoradiation with carboplatin, paclitaxel, and immunotherapy with Imfinzi -Last saw pulm on 05/10/20 -She is supposed to be on q6 month surveillance but has been lost to follow-up -Will add Dr. Julien Nordmann to the treatment team  Malnutrition -Body mass index is 13.67 kg/m..  -The patient has at least 2 indicators for malnutrition (insufficient energy intake, weight loss, loss of muscle mass, loss of subcutaneous fat).  -This is likely due to starvation related/chronic disease -Will obtain a nutrition consult for further  recommendations.   Tobacco dependence -Encourage cessation.   -Patch ordered   DNR -I have discussed code status with the patient and she patient would not desire resuscitation and would prefer to die a natural death should that situation arise. -She will need a gold out of facility DNR form at the time of discharge     Advance Care Planning:   Code Status: DNR   Consults: Pulmonology; Oncology (added to treatment team); TOC team/PT/OT/Nutrition/RT consults  DVT Prophylaxis: Heparin  Family Communication: None present; she declined to have me call family at the time of admission  Severity of Illness: The appropriate patient status for this patient is INPATIENT. Inpatient status is judged to be reasonable and necessary in order to provide the required intensity of service to ensure the patient's safety. The patient's presenting symptoms, physical exam findings, and initial radiographic and laboratory data in the context of their chronic comorbidities is felt to place them at high  risk for further clinical deterioration. Furthermore, it is not anticipated that the patient will be medically stable for discharge from the hospital within 2 midnights of admission.   * I certify that at the point of admission it is my clinical judgment that the patient will require inpatient hospital care spanning beyond 2 midnights from the point of admission due to high intensity of service, high risk for further deterioration and high frequency of surveillance required.*  Author: Karmen Bongo, MD 06/21/2021 4:22 PM  For on call review [email or web address].

## 2021-06-21 NOTE — ED Notes (Signed)
Pt requesting to know what the cost of the nicotine patch is because, "I'm not paying $499 for a damn patch".

## 2021-06-22 ENCOUNTER — Telehealth: Payer: Self-pay | Admitting: Pulmonary Disease

## 2021-06-22 DIAGNOSIS — R42 Dizziness and giddiness: Secondary | ICD-10-CM

## 2021-06-22 DIAGNOSIS — E43 Unspecified severe protein-calorie malnutrition: Secondary | ICD-10-CM | POA: Insufficient documentation

## 2021-06-22 DIAGNOSIS — N1831 Chronic kidney disease, stage 3a: Secondary | ICD-10-CM

## 2021-06-22 DIAGNOSIS — I5033 Acute on chronic diastolic (congestive) heart failure: Secondary | ICD-10-CM

## 2021-06-22 DIAGNOSIS — J9601 Acute respiratory failure with hypoxia: Secondary | ICD-10-CM

## 2021-06-22 LAB — CBC
HCT: 36.7 % (ref 36.0–46.0)
Hemoglobin: 11.1 g/dL — ABNORMAL LOW (ref 12.0–15.0)
MCH: 28.1 pg (ref 26.0–34.0)
MCHC: 30.2 g/dL (ref 30.0–36.0)
MCV: 92.9 fL (ref 80.0–100.0)
Platelets: 149 10*3/uL — ABNORMAL LOW (ref 150–400)
RBC: 3.95 MIL/uL (ref 3.87–5.11)
RDW: 14.4 % (ref 11.5–15.5)
WBC: 5.7 10*3/uL (ref 4.0–10.5)
nRBC: 0 % (ref 0.0–0.2)

## 2021-06-22 LAB — BASIC METABOLIC PANEL
Anion gap: 8 (ref 5–15)
BUN: 32 mg/dL — ABNORMAL HIGH (ref 8–23)
CO2: 31 mmol/L (ref 22–32)
Calcium: 8.6 mg/dL — ABNORMAL LOW (ref 8.9–10.3)
Chloride: 104 mmol/L (ref 98–111)
Creatinine, Ser: 1.02 mg/dL — ABNORMAL HIGH (ref 0.44–1.00)
GFR, Estimated: 57 mL/min — ABNORMAL LOW (ref >60)
Glucose, Bld: 120 mg/dL — ABNORMAL HIGH (ref 70–99)
Potassium: 4.2 mmol/L (ref 3.5–5.1)
Sodium: 143 mmol/L (ref 135–145)

## 2021-06-22 MED ORDER — IPRATROPIUM-ALBUTEROL 0.5-2.5 (3) MG/3ML IN SOLN
3.0000 mL | Freq: Three times a day (TID) | RESPIRATORY_TRACT | Status: DC
  Administered 2021-06-22 – 2021-06-23 (×2): 3 mL via RESPIRATORY_TRACT
  Filled 2021-06-22 (×2): qty 3

## 2021-06-22 MED ORDER — MECLIZINE HCL 12.5 MG PO TABS: 12.5000 mg | ORAL_TABLET | Freq: Two times a day (BID) | ORAL | Status: DC | PRN

## 2021-06-22 NOTE — Consult Note (Addendum)
Cardiology Consultation:   Patient ID: Sherry [name] MRN: [id number]; DOB: [birth year]  Admit date: 06/20/2021 Date of Consult: 06/22/2021  PCP:  [name], NP   [facility] Providers Cardiologist:  None   New  Patient Profile:   Sherry [name] is a 76 y.o. female with a hx of COPD, nonsmall cell lung cancer (in remission), depression who is being seen 06/22/2021 for the evaluation of moderate to severe MR at the request of Dr. Maylene Roes.  History of Present Illness:   Ms. Sherry [name] is a 76 year old female with past medical history noted above.  She has not been evaluated by cardiology in the past.  She has known history of nonsmall lung cancer stage IIb/IIIa initially diagnosed 08/2017.  Treated with concurrent chemoradiation and also follows with pulmonology.  She presented to the ED on 6/12 with complaints of syncope.  Reported she was at [facility] walking in the freezer section whenever she developed a sudden onset of nausea and noted multiple colors in her vision.  States she walked to the bathroom and did not remember any events after that.  EMS reported that she had a positive LOC for 10 minutes.  Reported to be tachypneic and hypoxic requiring O2.   Labs in the ED showed sodium 142, potassium 3.9, creatinine 1.23, high-sensitivity troponin 80>> 93, BNP 1691, WBC 5.6, hemoglobin 12.2.  EKG showed sinus rhythm, 96 bpm, nonspecific changes.  Chest x-ray showed increased small bilateral pleural effusion slightly loculated, emphysema with suggestion for CT to correlate.  She was admitted to internal medicine for further management.  Echocardiogram 6/13 showed LVEF of 55 to 60%, no regional wall motion normality, grade 2 diastolic dysfunction, mildly reduced RV, moderately elevated PA pressure, moderate to severe MR, low E wave velocity suggesting likely moderate MR, mild to moderate TR. cardiology now asked to evaluate.  In talking with patient she has been in remission for her lung cancer.   She was supposed to have a CT scan back in November but did not follow through with this.  Her activity level is somewhat limited but she does do grocery shopping and simple ADLs.  States she is not normally dyspneic with these activities, chest pain.   Past Medical History:  Diagnosis Date   Allergy    Anxiety    Arthritis    Cancer ([place])    Cavitating mass in left lower lung lobe    Complication of anesthesia    woke up during procedure   COPD (chronic obstructive pulmonary disease) ([place])    not on home O2   Depression    Family history of adverse reaction to anesthesia    " my mother was always hard to wake up"   Full dentures    Headache    Neuromuscular disorder ([place])    Pneumonia    Thoracic spine fracture ([place]) 11/01/2017   T9   Wears glasses     Past Surgical History:  Procedure Laterality Date   ELBOW SURGERY     for  "Tennis Elbow"   hand surgery     IR KYPHO THORACIC WITH BONE BIOPSY  11/27/2017   IR KYPHO THORACIC WITH BONE BIOPSY  03/01/2018   IR KYPHO THORACIC WITH BONE BIOPSY  03/26/2018   IR RADIOLOGIST EVAL & MGMT  11/20/2017   IR RADIOLOGIST EVAL & MGMT  02/26/2018   IR RADIOLOGIST EVAL & MGMT  03/13/2018   MULTIPLE TOOTH EXTRACTIONS     TUBAL LIGATION  VIDEO BRONCHOSCOPY N/A 08/31/2017   Procedure: VIDEO BRONCHOSCOPY;  Surgeon: Melrose Nakayama, MD;  Location: [facility] OR;  Service: Thoracic;  Laterality: N/A;     Home Medications:  Prior to Admission medications   Medication Sig Start Date End Date Taking? Authorizing Provider  acetaminophen (TYLENOL 8 HOUR) 650 MG CR tablet Take 1 tablet (650 mg total) by mouth every 8 (eight) hours as needed for pain or fever. 10/30/17  Yes Varney Biles, MD  HYDROcodone-acetaminophen (NORCO/VICODIN) 5-325 MG tablet Take 1 tablet by mouth every 6 (six) hours as needed for moderate pain. 08/13/18  Yes [provider]  albuterol (VENTOLIN HFA) 108 (90 Base) MCG/ACT inhaler INHALE 2 PUFFS INTO THE LUNGS EVERY 6  HOURS AS NEEDED FOR WHEEZING OR SHORTNESS OF BREATH Patient not taking: Reported on 06/21/2021 10/03/18   Curt Bears, MD  nicotine (NICODERM CQ - DOSED IN MG/24 HR) 7 mg/24hr patch Place 1 patch (7 mg total) onto the skin daily. Patient not taking: Reported on 06/21/2021 05/10/20   Heilingoetter, Cassandra L, PA-C  predniSONE (DELTASONE) 50 MG tablet TAKE 1 TABLET 13 HOURS BEFORE, THEN 7 HOURS BEFORE, THEN 1 HOUR BEFORE CT SCAN Patient not taking: Reported on 06/21/2021 11/04/20   Heilingoetter, Cassandra L, PA-C    Inpatient Medications: Scheduled Meds:  feeding supplement  237 mL Oral BID BM   heparin injection (subcutaneous)  5,000 Units Subcutaneous Q8H   ipratropium-albuterol  3 mL Nebulization Q6H   methylPREDNISolone (SOLU-MEDROL) injection  40 mg Intravenous Q12H   nicotine  14 mg Transdermal Daily   sodium chloride flush  3 mL Intravenous Q12H   Continuous Infusions:  PRN Meds: acetaminophen **OR** acetaminophen, albuterol, HYDROcodone-acetaminophen, meclizine, ondansetron **OR** ondansetron (ZOFRAN) IV  Allergies:    Allergies  Allergen Reactions   Omnipaque [Iohexol] Hives and Itching    Patient had a late reaction of itching and hives after scan will need premeds   Penicillins Hives    Has patient had a PCN reaction causing immediate rash, facial/tongue/throat swelling, SOB or lightheadedness with hypotension: no Has patient had a PCN reaction causing severe rash involving mucus membranes or skin necrosis: No Has patient had a PCN reaction that required hospitalization: No Has patient had a PCN reaction occurring within the last 10 years: No If all of the above answers are "NO", then may proceed with Cephalosporin use.     Social History:   Social History   Socioeconomic History   Marital status: Divorced    Spouse name: Not on file   Number of children: Not on file   Years of education: Not on file   Highest education level: Not on file  Occupational History    Occupation: retired  Tobacco Use   Smoking status: Every Day    Packs/day: 1.00    Years: 50.00    Total pack years: 50.00    Types: Cigarettes   Smokeless tobacco: Never   Tobacco comments:    0.5 ppd currently  Vaping Use   Vaping Use: Never used  Substance and Sexual Activity   Alcohol use: No   Drug use: No   Sexual activity: Never  Other Topics Concern   Not on file  Social History Narrative   Not on file   Social Determinants of Health   Financial Resource Strain: Not on file  Food Insecurity: Not on file  Transportation Needs: Not on file  Physical Activity: Not on file  Stress: Not on file  Social Connections: Not on file  Intimate Partner Violence: Not on file    Family History:    Family History  Problem Relation Age of Onset   Diabetes Mother      ROS:  Please see the history of present illness.   All other ROS reviewed and negative.     Physical Exam/Data:   Vitals:   06/22/21 0900 06/22/21 1106 06/22/21 1110 06/22/21 1433  BP: (!) 122/94 (!) 90/58 (!) 90/58   Pulse: 91   84  Resp: (!) 21 20 (!) 25 18  Temp:  98.7 F (37.1 C)    TempSrc:  Oral    SpO2:  97% 92% 94%  Weight:      Height:        Intake/Output Summary (Last 24 hours) at 06/22/2021 1538 Last data filed at 06/21/2021 1800 Gross per 24 hour  Intake 150 ml  Output --  Net 150 ml      06/22/2021    4:08 AM 06/20/2021    8:45 PM 05/10/2020    2:54 PM  Last 3 Weights  Weight (lbs) 72 lb 1.5 oz 70 lb 70 lb 14.4 oz  Weight (kg) 32.7 kg 31.752 kg 32.16 kg     Body mass index is 14.08 kg/m.  General: Thin frail older female HEENT: normal Neck: no JVD Vascular: No carotid bruits; Distal pulses 2+ bilaterally Cardiac:  normal S1, S2; RRR; + systolic murmur  Lungs: Expiratory wheeze Abd: soft, nontender, no hepatomegaly  Ext: no edema Musculoskeletal:  No deformities, BUE and BLE strength normal and equal Skin: warm and dry  Neuro:  CNs 2-12 intact, no focal abnormalities  noted Psych:  Normal affect   EKG:  The EKG was personally reviewed and demonstrates:  Sinus rhythm, 96 bpm, nonspecific changes Telemetry:  Telemetry was personally reviewed and demonstrates: Sinus rhythm  Relevant CV Studies:  Echo: 06/21/2021  IMPRESSIONS     1. Left ventricular ejection fraction, by estimation, is 55 to 60%. The  left ventricle has normal function. The left ventricle has no regional  wall motion abnormalities. Left ventricular diastolic parameters are  consistent with Grade II diastolic  dysfunction (pseudonormalization). Elevated left atrial pressure.   2. Right ventricular systolic function is mildly reduced. The right  ventricular size is mildly enlarged. There is moderately elevated  pulmonary artery systolic pressure. The estimated right ventricular  systolic pressure is 41.9 mmHg.   3. The mitral valve is abnormal. Moderate to severe mitral valve  regurgitation. No evidence of mitral stenosis. Posterior directed MR jet.  Normal LA/LV size, low E wave velocity suggests likely moderate MR   4. Tricuspid valve regurgitation is mild to moderate.   5. The aortic valve is tricuspid. Aortic valve regurgitation is trivial.  No aortic stenosis is present.   6. The inferior vena cava is dilated in size with <50% respiratory  variability, suggesting right atrial pressure of 15 mmHg.   FINDINGS   Left Ventricle: Left ventricular ejection fraction, by estimation, is 55  to 60%. The left ventricle has normal function. The left ventricle has no  regional wall motion abnormalities. The left ventricular internal cavity  size was small. There is no left  ventricular hypertrophy. Left ventricular diastolic parameters are  consistent with Grade II diastolic dysfunction (pseudonormalization).  Elevated left atrial pressure.   Right Ventricle: The right ventricular size is mildly enlarged. Right  vetricular wall thickness was not well visualized. Right ventricular   systolic function is mildly reduced. There is moderately elevated  pulmonary  artery systolic pressure. The tricuspid   regurgitant velocity is 3.13 m/s, and with an assumed right atrial  pressure of 15 mmHg, the estimated right ventricular systolic pressure is  05.6 mmHg.   Left Atrium: Left atrial size was normal in size.   Right Atrium: Right atrial size was normal in size.   Pericardium: There is no evidence of pericardial effusion.   Mitral Valve: The mitral valve is abnormal. Moderate to severe mitral  valve regurgitation. No evidence of mitral valve stenosis.   Tricuspid Valve: The tricuspid valve is normal in structure. Tricuspid  valve regurgitation is mild to moderate.   Aortic Valve: The aortic valve is tricuspid. Aortic valve regurgitation is  trivial. No aortic stenosis is present.   Pulmonic Valve: The pulmonic valve was not well visualized. Pulmonic valve  regurgitation is not visualized.   Aorta: The aortic root is normal in size and structure.   Venous: The inferior vena cava is dilated in size with less than 50%  respiratory variability, suggesting right atrial pressure of 15 mmHg.   IAS/Shunts: The interatrial septum was not well visualized.   Laboratory Data:  High Sensitivity Troponin:   Recent Labs  Lab 06/20/21 1707 06/21/21 0015  TROPONINIHS 80* 93*     Chemistry Recent Labs  Lab 06/20/21 1707 06/20/21 2140 06/21/21 0756 06/22/21 0321  NA 142 144 142 143  K 3.9 3.8 3.9 4.2  CL 106  --   --  104  CO2 28  --   --  31  GLUCOSE 129*  --   --  120*  BUN 29*  --   --  32*  CREATININE 1.23*  --   --  1.02*  CALCIUM 8.2*  --   --  8.6*  GFRNONAA 46*  --   --  57*  ANIONGAP 8  --   --  8    Recent Labs  Lab 06/20/21 1707  PROT 5.6*  ALBUMIN 2.9*  AST 27  ALT 13  ALKPHOS 59  BILITOT 0.5   Lipids No results for input(s): "CHOL", "TRIG", "HDL", "LABVLDL", "LDLCALC", "CHOLHDL" in the last 168 hours.  Hematology Recent Labs  Lab  06/20/21 1707 06/20/21 2140 06/21/21 0756 06/22/21 0321  WBC 5.6  --   --  5.7  RBC 4.27  --   --  3.95  HGB 12.2 12.9 12.6 11.1*  HCT 41.0 38.0 37.0 36.7  MCV 96.0  --   --  92.9  MCH 28.6  --   --  28.1  MCHC 29.8*  --   --  30.2  RDW 14.4  --   --  14.4  PLT 153  --   --  149*   Thyroid No results for input(s): "TSH", "FREET4" in the last 168 hours.  BNP Recent Labs  Lab 06/21/21 1614  BNP 1,691.3*    DDimer No results for input(s): "DDIMER" in the last 168 hours.   Radiology/Studies:  ECHOCARDIOGRAM COMPLETE  Result Date: 06/21/2021    ECHOCARDIOGRAM REPORT   Patient Name:   HILARIA TITSWORTH Date of Exam: 06/21/2021 Medical Rec #:  [id number]      Height:       60.0 in Accession #:    [id number]     Weight:       70.0 lb Date of Birth:  1945-09-16      BSA:          1.195 m Patient Age:    58 years  BP:           116/81 mmHg Patient Gender: F              HR:           90 bpm. Exam Location:  Inpatient Procedure: 2D Echo, Color Doppler and Cardiac Doppler Indications:    R06.9 DOE  History:        Patient has no prior history of Echocardiogram examinations.                 COPD; Risk Factors:Lung Cancer.  Sonographer:    Raquel Sarna Senior RDCS Referring Phys: [id number] JENNIFER YATES  Sonographer Comments: Difficult windows due to very small body habitus IMPRESSIONS  1. Left ventricular ejection fraction, by estimation, is 55 to 60%. The left ventricle has normal function. The left ventricle has no regional wall motion abnormalities. Left ventricular diastolic parameters are consistent with Grade II diastolic dysfunction (pseudonormalization). Elevated left atrial pressure.  2. Right ventricular systolic function is mildly reduced. The right ventricular size is mildly enlarged. There is moderately elevated pulmonary artery systolic pressure. The estimated right ventricular systolic pressure is 46.5 mmHg.  3. The mitral valve is abnormal. Moderate to severe mitral valve regurgitation. No  evidence of mitral stenosis. Posterior directed MR jet. Normal LA/LV size, low E wave velocity suggests likely moderate MR  4. Tricuspid valve regurgitation is mild to moderate.  5. The aortic valve is tricuspid. Aortic valve regurgitation is trivial. No aortic stenosis is present.  6. The inferior vena cava is dilated in size with <50% respiratory variability, suggesting right atrial pressure of 15 mmHg. FINDINGS  Left Ventricle: Left ventricular ejection fraction, by estimation, is 55 to 60%. The left ventricle has normal function. The left ventricle has no regional wall motion abnormalities. The left ventricular internal cavity size was small. There is no left ventricular hypertrophy. Left ventricular diastolic parameters are consistent with Grade II diastolic dysfunction (pseudonormalization). Elevated left atrial pressure. Right Ventricle: The right ventricular size is mildly enlarged. Right vetricular wall thickness was not well visualized. Right ventricular systolic function is mildly reduced. There is moderately elevated pulmonary artery systolic pressure. The tricuspid  regurgitant velocity is 3.13 m/s, and with an assumed right atrial pressure of 15 mmHg, the estimated right ventricular systolic pressure is 03.5 mmHg. Left Atrium: Left atrial size was normal in size. Right Atrium: Right atrial size was normal in size. Pericardium: There is no evidence of pericardial effusion. Mitral Valve: The mitral valve is abnormal. Moderate to severe mitral valve regurgitation. No evidence of mitral valve stenosis. Tricuspid Valve: The tricuspid valve is normal in structure. Tricuspid valve regurgitation is mild to moderate. Aortic Valve: The aortic valve is tricuspid. Aortic valve regurgitation is trivial. No aortic stenosis is present. Pulmonic Valve: The pulmonic valve was not well visualized. Pulmonic valve regurgitation is not visualized. Aorta: The aortic root is normal in size and structure. Venous: The inferior  vena cava is dilated in size with less than 50% respiratory variability, suggesting right atrial pressure of 15 mmHg. IAS/Shunts: The interatrial septum was not well visualized.  LEFT VENTRICLE PLAX 2D LVIDd:         3.10 cm   Diastology LVIDs:         2.10 cm   LV e' medial:    4.79 cm/s LV PW:         0.80 cm   LV E/e' medial:  19.5 LV IVS:        0.60  cm   LV e' lateral:   5.22 cm/s LVOT diam:     1.70 cm   LV E/e' lateral: 17.9 LV SV:         22 LV SV Index:   18 LVOT Area:     2.27 cm  RIGHT VENTRICLE RV S prime:     9.57 cm/s TAPSE (M-mode): 1.3 cm LEFT ATRIUM             Index        RIGHT ATRIUM          Index LA diam:        2.90 cm 2.43 cm/m   RA Area:     9.69 cm LA Vol (A2C):   30.3 ml 25.36 ml/m  RA Volume:   21.40 ml 17.91 ml/m LA Vol (A4C):   29.7 ml 24.86 ml/m LA Biplane Vol: 30.4 ml 25.44 ml/m  AORTIC VALVE LVOT Vmax:   63.80 cm/s LVOT Vmean:  47.400 cm/s LVOT VTI:    0.096 m  AORTA Ao Root diam: 3.00 cm MITRAL VALVE                  TRICUSPID VALVE MV Area (PHT): 4.93 cm       TR Peak grad:   39.2 mmHg MV Decel Time: 154 msec       TR Vmax:        313.00 cm/s MR Peak grad:    72.2 mmHg MR Mean grad:    50.0 mmHg    SHUNTS MR Vmax:         425.00 cm/s  Systemic VTI:  0.10 m MR Vmean:        329.0 cm/s   Systemic Diam: 1.70 cm MR PISA:         1.90 cm MR PISA Eff ROA: 16 mm MR PISA Radius:  0.55 cm MV E velocity: 93.60 cm/s MV A velocity: 79.60 cm/s MV E/A ratio:  1.18 Oswaldo Milian MD Electronically signed by Oswaldo Milian MD Signature Date/Time: 06/21/2021/3:11:35 PM    Final    CT Angio Chest PE W and/or Wo Contrast  Result Date: 06/21/2021 CLINICAL DATA:  76 year old female status post syncope and fall. History of lung cancer, left lower lobe radiation fibrosis, emphysema. EXAM: CT ANGIOGRAPHY CHEST WITH CONTRAST TECHNIQUE: Multidetector CT imaging of the chest was performed using the standard protocol during bolus administration of intravenous contrast. Multiplanar CT  image reconstructions and MIPs were obtained to evaluate the vascular anatomy. RADIATION DOSE REDUCTION: This exam was performed according to the departmental dose-optimization program which includes automated exposure control, adjustment of the mA and/or kV according to patient size and/or use of iterative reconstruction technique. CONTRAST:  68mL OMNIPAQUE IOHEXOL 350 MG/ML SOLN COMPARISON:  Restaging chest CT 05/05/2020. FINDINGS: Cardiovascular: Excellent contrast bolus timing in the pulmonary arterial tree. No focal filling defect identified in the pulmonary arteries to suggest acute pulmonary embolism. Calcified aortic atherosclerosis. Calcified coronary artery atherosclerosis. No cardiomegaly. No definite pericardial effusion. However, there is a small volume of contrast reflux into the hepatic IVC and hepatic veins. Mediastinum/Nodes: No definite mediastinal mass or lymphadenopathy. Lungs/Pleura: New moderate-sized right pleural effusion, mostly layering and with simple fluid density. Chronic but increased small left side pleural effusion, chronically somewhat loculated at the lung base (series 5, image 88). Major airways remain patent but there is progressive consolidation of the left lower lobe from last year. Underlying centrilobular emphysema. Mild diffuse increased pulmonary interstitial density in both lungs.  Mild compressive atelectasis also on the right. Upper Abdomen: Negative visible centrally noncontrast liver, spleen, pancreas, adrenal glands, kidneys, stomach in the upper abdomen. Musculoskeletal: Osteopenia. Chronic T9 through T12 compression fractures, some previously augmented. No acute or suspicious osseous lesion identified. Review of the MIP images confirms the above findings. IMPRESSION: 1. Negative for acute pulmonary embolus. 2. Progressed left lower lobe consolidation since last year, superimposed on post treatment pulmonary fibrosis. This might be atelectasis (see #3), but Pneumonia  is not excluded. 3. Constellation suspicious for a degree of pulmonary edema and right heart failure superimposed on chronic lung disease with Emphysema (ICD10-J43.9), including new moderate size right pleural effusion and increased partially loculated left pleural effusion. 4. Calcified coronary artery and Aortic Atherosclerosis (ICD10-I70.0). Electronically Signed   By: Genevie Ann M.D.   On: 06/21/2021 06:22   CT Cervical Spine Wo Contrast  Result Date: 06/21/2021 CLINICAL DATA:  Neck trauma (Age >= 65y).  Syncope, fall EXAM: CT CERVICAL SPINE WITHOUT CONTRAST TECHNIQUE: Multidetector CT imaging of the cervical spine was performed without intravenous contrast. Multiplanar CT image reconstructions were also generated. RADIATION DOSE REDUCTION: This exam was performed according to the departmental dose-optimization program which includes automated exposure control, adjustment of the mA and/or kV according to patient size and/or use of iterative reconstruction technique. COMPARISON:  None Available. FINDINGS: Alignment: No subluxation Skull base and vertebrae: No acute fracture. No primary bone lesion or focal pathologic process. Soft tissues and spinal canal: No prevertebral fluid or swelling. No visible canal hematoma. Disc levels: Diffuse degenerative disc disease with disc space narrowing and early spurring. Upper chest: Centrilobular emphysema. Bilateral pleural effusions partially imaged. Other: None IMPRESSION: No acute bony abnormality. Layering bilateral pleural effusions.  Emphysema. Electronically Signed   By: Rolm Baptise M.D.   On: 06/21/2021 00:14   CT Head Wo Contrast  Result Date: 06/21/2021 CLINICAL DATA:  Head trauma, minor (Age >= 65y).  Syncope, fall EXAM: CT HEAD WITHOUT CONTRAST TECHNIQUE: Contiguous axial images were obtained from the base of the skull through the vertex without intravenous contrast. RADIATION DOSE REDUCTION: This exam was performed according to the departmental  dose-optimization program which includes automated exposure control, adjustment of the mA and/or kV according to patient size and/or use of iterative reconstruction technique. COMPARISON:  None Available. FINDINGS: Brain: No acute intracranial abnormality. Specifically, no hemorrhage, hydrocephalus, mass lesion, acute infarction, or significant intracranial injury. Vascular: No hyperdense vessel or unexpected calcification. Skull: No acute calvarial abnormality. Sinuses/Orbits: No acute findings Other: None IMPRESSION: No acute intracranial abnormality. Electronically Signed   By: Rolm Baptise M.D.   On: 06/21/2021 00:08   DG Chest 2 View  Result Date: 06/20/2021 CLINICAL DATA:  Fall and syncope EXAM: CHEST - 2 VIEW COMPARISON:  01/25/2018, CT chest 05/05/2020 FINDINGS: Advanced emphysema. Small bilateral pleural effusions which appear slightly loculated. Distortion in the left infrahilar lung corresponding to post therapy changes on recent CT. New left suprahilar somewhat nodular opacity. Stable cardiomediastinal silhouette. Vascular congestion and diffuse interstitial opacity which could be due to mild edema. IMPRESSION: 1. Increased small bilateral pleural effusion which appears slightly loculated. Diffuse increased interstitial opacity which may be due to mild edema 2. Emphysema with similar left infrahilar distortion and consolidation. New somewhat nodular opacity in the left suprahilar lung which could be due to a focus of pneumonia or possibly a lung nodule. Correlation with CT is suggested. Electronically Signed   By: Donavan Foil M.D.   On: 06/20/2021 21:46  Assessment and Plan:   Sherry [name] is a 76 y.o. female with a hx of COPD, nonsmall cell lung cancer (in remission), depression who is being seen 06/22/2021 for the evaluation of moderate to severe MR at the request of Dr. Maylene Roes.  Syncope: Suspect vagal symptoms based off patient's presentation, also with history of vertigo. No concerning  arrhythmias noted on telemetry during admission.  CT angio chest negative for PE. Echo showed normal LV function, with moderate to severe MR.  -- Would plan for cardiac monitor at discharge  Moderate to severe MR: Echo this admission noting mild to moderate MR, no mitral stenosis, low E wave velocity suggesting likely moderate MR.   -- would continue to surveillance monitoring  Non-small cell lung cancer: Diagnosed 08/2017.  Treated with chemo and radiation.  Currently in remission --Follows with oncology  Acute hypoxic respiratory failure secondary to COPD HFpEF --Initially hypoxic on admission and continues to require O2.  BNP elevated 1691.  Chest with moderate size right pleural effusion and left-sided partially loculated effusion. --Will give IV Lasix 40 mg x 1  Malnutrition: BMI 14  Risk Assessment/Risk Scores:   [facility] [place] [facility] Association (NYHA) Functional Class NYHA Class II      For questions or updates, please contact [place] Please consult [email or web address] for contact info under    Signed, Reino Bellis, NP  06/22/2021 3:38 PM  Agree with note by Reino Bellis NP-C  We are asked to see Sherry [name] for an episode of syncope.  She apparently was in [place] shopping, got cold and sweaty and passed out.  She is never passed out before.  Her history is remarkable for long history tobacco use having stopped several years ago with diagnosis of small cell lung cancer currently in remission.  She is never had a heart attack or stroke.  She denies chest pain.  Her exam is benign.  EKG shows no acute changes.  Chest CT showed no evidence of pulmonary emboli but there were pleural effusions noted.  Her cardiac enzymes were mildly elevated in the 80-90 range but flat.  2D echo revealed normal LV systolic function with moderate to severe MR.  Telemetry shows no arrhythmias.  Is unclear the etiology of her syncopal episode.  Her symptoms sound vagal.  Probably okay to discharge home  tomorrow.  Given elevated BNP and pleural effusions not unreasonable to give some IV Lasix although she does appear dry peripherally.  She has elevated JVP.  Would recommend 2-week Zio patch as an outpatient.   Lorretta Harp, M.D., [place], [facility], [place] [address] [place], [place]  [id number]  [phone] 06/22/2021 4:41 PM

## 2021-06-22 NOTE — Evaluation (Signed)
Occupational Therapy Evaluation Patient Details Name: Sherry [name] MRN: [id number] DOB: 11/07/1945 Today's Date: 06/22/2021   History of Present Illness Pt is a 76 yo female presenting s/p syncopal episode and collapse. Pt was admitted on 6/12 for syncope and collapse with possible COPD exacerbation. CTA of chest revealed atelectasis, chronic fibrosis, and bilateral pleural effusions. PMH includes stage 3 non-small cell lung cancer, COPD, and emphysema.   Clinical Impression   Pt PTA: Pt living alone, driving, independent; assist from brother for appointments. Pt currently, appears to be at her functional baseline for ADL and mobility. No physical assist for pt other than managing lines for safety in room. Pt reported no SOB, but fatigue after tasks. Pt standing >3 mins at this time for ADL tasks at sink with  modified independence. Pt reaching upper and lower body sitting EOB without physical assist for ADL. Unable to get O2 reading. Pt reports fatigue with simple tasks at sink. Pt wearing 2L O2.  Pt does not require continued OT skilled services. OT signing off.     Recommendations for follow up therapy are one component of a multi-disciplinary discharge planning process, led by the attending physician.  Recommendations may be updated based on patient status, additional functional criteria and insurance authorization.   Follow Up Recommendations  No OT follow up    Assistance Recommended at Discharge Set up Supervision/Assistance  Patient can return home with the following Assistance with cooking/housework    Functional Status Assessment  Patient has had a recent decline in their functional status and demonstrates the ability to make significant improvements in function in a reasonable and predictable amount of time.  Equipment Recommendations  None recommended by OT    Recommendations for Other Services       Precautions / Restrictions Precautions Precautions: Fall;Other  (comment) Precaution Comments: Watch O2 Restrictions Weight Bearing Restrictions: No      Mobility Bed Mobility Overal bed mobility: Modified Independent                  Transfers Overall transfer level: Modified independent                        Balance Overall balance assessment: Needs assistance Sitting-balance support: Feet supported, No upper extremity supported Sitting balance-Leahy Scale: Fair       Standing balance-Leahy Scale: Fair Standing balance comment: using sink as needed for stability.                           ADL either performed or assessed with clinical judgement   ADL Overall ADL's : At baseline;Modified independent                                       General ADL Comments: Pt feels more fatigued, but overall she is performing at baseline functional status.     Vision Baseline Vision/History: 1 Wears glasses Ability to See in Adequate Light: 0 Adequate Patient Visual Report: No change from baseline Vision Assessment?: No apparent visual deficits Additional Comments: uses glassses for reading; had cataract sx and can see very well now     Perception     Praxis      Pertinent Vitals/Pain Pain Assessment Pain Assessment: No/denies pain Pain Location: low back Pain Descriptors / Indicators: Aching, Sore Pain Intervention(s): Monitored during session, Repositioned  Hand Dominance Right   Extremity/Trunk Assessment Upper Extremity Assessment Upper Extremity Assessment: Overall WFL for tasks assessed   Lower Extremity Assessment Lower Extremity Assessment: Generalized weakness   Cervical / Trunk Assessment Cervical / Trunk Assessment: Other exceptions Cervical / Trunk Exceptions: forward head, rounded shoulders   Communication Communication Communication: No difficulties   Cognition Arousal/Alertness: Awake/alert Behavior During Therapy: WFL for tasks assessed/performed Overall  Cognitive Status: Within Functional Limits for tasks assessed                                       General Comments  Unable to get O2 reading, pt reports fatigue with simple tasks at sink. Pt wearing 2L O2.    Exercises     Shoulder Instructions      Home Living Family/patient expects to be discharged to:: Private residence Living Arrangements: Alone Available Help at Discharge: Family Type of Home: Apartment Home Access: Level entry     Home Layout: One level     Bathroom Shower/Tub: Teacher, early years/pre: Standard     Home Equipment: None          Prior Functioning/Environment Prior Level of Function : Independent/Modified Independent             Mobility Comments: independent ADLs Comments: independent; brother goes to appts with student and takes grocery shopping; pt uses buggy in store        OT Problem List: Decreased activity tolerance      OT Treatment/Interventions:      OT Goals(Current goals can be found in the care plan section) Acute Rehab OT Goals Patient Stated Goal: to go home OT Goal Formulation: All assessment and education complete, DC therapy Potential to Achieve Goals: Good  OT Frequency:      Co-evaluation              AM-PAC OT "6 Clicks" Daily Activity     Outcome Measure Help from another person eating meals?: None Help from another person taking care of personal grooming?: None Help from another person toileting, which includes using toliet, bedpan, or urinal?: None Help from another person bathing (including washing, rinsing, drying)?: A Little Help from another person to put on and taking off regular upper body clothing?: None Help from another person to put on and taking off regular lower body clothing?: None 6 Click Score: 23   End of Session Equipment Utilized During Treatment: Oxygen Nurse Communication: Mobility status;Other (comment) (need for comfort for behind her ears d/t pain  from O2 tubing)  Activity Tolerance: Patient tolerated treatment well;Patient limited by fatigue Patient left: in bed;with call bell/phone within reach;with family/visitor present  OT Visit Diagnosis: Unsteadiness on feet (R26.81)                Time: 8938-1017 OT Time Calculation (min): 25 min Charges:  OT General Charges $OT Visit: 1 Visit OT Evaluation $OT Eval Moderate Complexity: 1 Mod OT Treatments $Self Care/Home Management : 8-22 mins  Jefferey Pica, OTR/L Acute Rehabilitation Services Office: [phone]   OEUMPNT I RWERX 06/22/2021, 3:34 PM

## 2021-06-22 NOTE — Plan of Care (Signed)

## 2021-06-22 NOTE — Telephone Encounter (Signed)
Please make hospital follow-up appointment in 2 weeks with APP COPD Hypoxic respiratory failure CHF/mitral regurgitation -causing bilateral effusions

## 2021-06-22 NOTE — Evaluation (Signed)
Physical Therapy Evaluation Patient Details Name: Sherry [name] MRN: [id number]      History of Present Illness  Pt is a 76 yo female presenting s/p syncopal episode and collapse. Pt was admitted on 6/12 for syncope and collapse with possible COPD exacerbation. CTA of chest revealed atelectasis, chronic fibrosis, and bilateral pleural effusions. PMH includes stage 3 non-small cell lung cancer, COPD, and emphysema.  Clinical Impression  Pt admitted with above diagnosis. PTA pt was completely independent with all ADLs/iADLs, transfers, and ambulation. Upon PT eval, pt presents with generalized weakness and mild balance deficits. After completing a short Vestibular screen, it is recommended pt receives a full evaluation due to + VOR to R, nystagmus after sidelying to sit, symptom provocation with convergence in visual tracking, and dizziness lasting 2-3 seconds after sit to supine. Recommendation for d/c is dependent upon this suggested evaluation. Pt tolerated 35 ft of ambulation with supervision and RW for safety, able to discontinue use of RW and regressed to min G for safety. PT was able to measure SpO2 pre-ambulation at 95% on 4L , and 91% on RA seated. Pt lives alone in a one-level apartment with no stairs to enter, has a brother who lives across town and is able to help PRN. Pt will continue to benefit from skilled PT for continued functional mobility training and higher level balance training in order to increase independence for safe d/c home. Will continue to follow acutely.      Recommendations for follow up therapy are one component of a multi-disciplinary discharge planning process, led by the attending physician.  Recommendations may be updated based on patient status, additional functional criteria and insurance authorization.  Follow Up Recommendations Outpatient PT (pending acute vestibular evaluation)    Assistance Recommended at Discharge PRN  Patient can return home with the  following  A little help with walking and/or transfers;A little help with bathing/dressing/bathroom;Assistance with cooking/housework;Assist for transportation    Equipment Recommendations None recommended by PT  Recommendations for Other Services       Functional Status Assessment Patient has had a recent decline in their functional status and demonstrates the ability to make significant improvements in function in a reasonable and predictable amount of time.     Precautions / Restrictions Precautions Precautions: Fall;Other (comment) Precaution Comments: Watch O2 Restrictions Weight Bearing Restrictions: No      Mobility  Bed Mobility Overal bed mobility: Needs Assistance Bed Mobility: Rolling, Sidelying to Sit Rolling: Supervision Sidelying to sit: Supervision       General bed mobility comments: supervision for safety, required VCs for hand placement on bed rail    Transfers Overall transfer level: Needs assistance Equipment used: Rolling walker (2 wheels) Transfers: Sit to/from Stand Sit to Stand: Supervision           General transfer comment: from EOB x1, from toilet x1. supervision and RW for safety for stability. pt with use of momentum to stand and required cues for hand placement on bed and RW. seated EOB post-ambulation.    Ambulation/Gait Ambulation/Gait assistance: Supervision, Min guard Gait Distance (Feet): 35 Feet Assistive device: Rolling walker (2 wheels), None Gait Pattern/deviations: Step-through pattern, Decreased stride length, Narrow base of support, Trunk flexed Gait velocity: decreased Gait velocity interpretation: <1.31 ft/sec, indicative of household ambulator   General Gait Details: pt with slow but steady gait pattern, supervision for safety with RW and regressed to min G for safety without RW. minimal knee and hip flexion. BP taken in laying 101/65  with HR 85 bpm, seated 112/68 with HR 91 bpm, post-ambulation BP 103/54 with HR 107  bpm.  Stairs            Wheelchair Mobility    Modified Rankin (Stroke Patients Only)       Balance Overall balance assessment: Needs assistance Sitting-balance support: Feet supported, No upper extremity supported Sitting balance-Leahy Scale: Fair Sitting balance - Comments: able to reach for tissues outside BOS without LOB.   Standing balance support: During functional activity, Single extremity supported Standing balance-Leahy Scale: Fair Standing balance comment: leaned on grab bar while standing in the bathroom without LOB. required at least 1 UE support while standing.                             Pertinent Vitals/Pain Pain Assessment Pain Assessment: Faces Faces Pain Scale: Hurts a little bit Pain Location: low back Pain Descriptors / Indicators: Aching, Sore Pain Intervention(s): Monitored during session    Home Living Family/patient expects to be discharged to:: Private residence Living Arrangements: Alone Available Help at Discharge: Family (brother) Type of Home: Apartment Home Access: Level entry       Home Layout: One level Home Equipment: None      Prior Function Prior Level of Function : Independent/Modified Independent             Mobility Comments: independent ADLs Comments: independent     Hand Dominance        Extremity/Trunk Assessment   Upper Extremity Assessment Upper Extremity Assessment: Defer to OT evaluation    Lower Extremity Assessment Lower Extremity Assessment: Generalized weakness    Cervical / Trunk Assessment Cervical / Trunk Assessment: Other exceptions Cervical / Trunk Exceptions: forward head, rounded shoulders  Communication   Communication: No difficulties  Cognition Arousal/Alertness: Awake/alert Behavior During Therapy: WFL for tasks assessed/performed Overall Cognitive Status: Within Functional Limits for tasks assessed                                 General Comments:  pt agreeable for mobility and understands need        General Comments General comments (skin integrity, edema, etc.): Performed short vestibular screening. Observed Nystagmus from sidelying to sit in R direction, VOR + toward R, pt reported occasional ringing in ears with dizziness upon laying down that lasts 2-3 seconds, and visual tracking + for symptoms with convergence.    Exercises     Assessment/Plan    PT Assessment Patient needs continued PT services  PT Problem List Decreased strength;Decreased activity tolerance;Decreased mobility;Decreased balance;Pain       PT Treatment Interventions Gait training;Functional mobility training;Therapeutic activities;Therapeutic exercise;Balance training;Patient/family education    PT Goals (Current goals can be found in the Care Plan section)  Acute Rehab PT Goals Patient Stated Goal: to go home PT Goal Formulation: With patient Time For Goal Achievement: 07/06/21 Potential to Achieve Goals: Good    Frequency Min 3X/week     Co-evaluation               AM-PAC PT "6 Clicks" Mobility  Outcome Measure Help needed turning from your back to your side while in a flat bed without using bedrails?: A Little Help needed moving from lying on your back to sitting on the side of a flat bed without using bedrails?: A Little Help needed moving to and from a bed to  a chair (including a wheelchair)?: A Little Help needed standing up from a chair using your arms (e.g., wheelchair or bedside chair)?: A Little Help needed to walk in hospital room?: A Little Help needed climbing 3-5 steps with a railing? : A Little 6 Click Score: 18    End of Session Equipment Utilized During Treatment: Gait belt Activity Tolerance: Patient tolerated treatment well Patient left: in bed;with call bell/phone within reach Nurse Communication: Mobility status;Other (comment) PT Visit Diagnosis: Unsteadiness on feet (R26.81);Other abnormalities of gait and  mobility (R26.89)    Time: 2505-3976 PT Time Calculation (min) (ACUTE ONLY): 34 min   Charges:   PT Evaluation $PT Eval Moderate Complexity: 1 Mod PT Treatments $Therapeutic Activity: 8-22 mins        Havery Moros, MS, SPT Acute Rehabilitation Services Office: [phone]  Havery Moros 06/22/2021, 2:03 PM

## 2021-06-22 NOTE — Progress Notes (Signed)
Attempted by PT to get pulse ox on ambulation but was not able to do it. Hands are so cold. Also tried to do it on forehead and ear lobe but unsuccessful, MD made aware.

## 2021-06-22 NOTE — Progress Notes (Addendum)
PROGRESS NOTE    Sherry [name]  GUR:[id number] DOB: 1946/01/09 DOA: 06/20/2021 PCP: [name], NP     Brief Narrative:  Sherry [name] is a 76 y.o. female with medical history significant of COPD and NSCLC presenting with syncope.  She was at [facility] and she got to the freezer section.  She was seeing multiple colors and felt nauseated.  She went to the bathroom and doesn't remember anything else.  EMS reported that she lost consciousness for 15 minutes.  No prior h/o similar.  For the last few weeks, her feet are swollen and her feet and legs have been jumping.  She was feeling well yesterday but she got very cold when she went in to the store.  No SOB.  No orthopnea.  +fever, maybe 2 weeks ago, 100.1, lasted for 1 day.  +cough, mild.  She was on chemo/rads for lung cancer, she went off for 6 months.  She was supposed to go back in November for CT and she didn't go.  She sees Dr. Julien Nordmann.  CTA chest negative for PE. Showing progressed left lower lobe consolidation since last year, superimposed on post treatment pulmonary fibrosis. This might be atelectasis but Pneumonia is not excluded. Constellation suspicious for a degree of pulmonary edema and right heart failure superimposed on chronic lung disease with Emphysema, including new moderate size right pleural effusion and increased partially loculated left pleural effusion.   New events last 24 hours / Subjective: States that for the past several weeks, wake up and has tremors of her hands. These improve and resolve throughout the day. Also has had some feet swelling, which seems better now that she has been keeping them elevated. Was able to get out of bed yesterday to use the bathroom, had some dizziness. Also admits to vertigo/room spinning at rest, especially with movement in bed.   Assessment & Plan:  Principal Problem:   Syncope and collapse Active Problems:   Malnutrition ([place])   COPD with acute exacerbation (HCC)   Stage III  squamous cell carcinoma of left lung (HCC)   Tobacco dependence   DNR (do not resuscitate)   Acute on chronic diastolic CHF (congestive heart failure) (HCC)   Chronic kidney disease, stage 3a (HCC)   Vertigo   Syncope -Could be likely related to respiratory compromise -Orthostatic vital sign unable to complete due to patient's complaints of dizziness -Echocardiogram revealed grade 2 diastolic dysfunction, no aortic stenosis -Continue to monitor on telemetry -PT OT   Acute hypoxemic respiratory failure secondary to COPD, CHF -Does not wear oxygen at baseline.  Wean to room air as able -Pulmonology following -Continue prednisone  Acute on chronic diastolic heart failure, mod-severe MR  -Echocardiogram revealed grade 2 diastolic dysfunction -BNP 1691.3 -Has intermittent pedal edema -Discussed fluid restriction diet with patient today -Limited in diuresis due to low BP -Cardiology consult   NSCLC -Stage IIb/IIIa poorly differentiated NSCLC diagnosed in 08/2017 -Treated with concurrent chemoradiation with carboplatin, paclitaxel, and immunotherapy with Imfinzi -Last saw pulm on 05/10/20, will need to follow-up on discharge  Malnutrition -Estimated body mass index is 14.08 kg/m as calculated from the following:   Height as of this encounter: 5' (1.524 m).   Weight as of this encounter: 32.7 kg.  Tobacco dependence -Cessation counseling, nicotine patch  CKD stage IIIa -Stable  Vertigo -Meclizine as needed  DVT prophylaxis:  heparin injection 5,000 Units Start: 06/21/21 1300  Code Status: DNR Family Communication: No family at bedside Disposition Plan:  Status is: Inpatient Remains inpatient appropriate because: remains on IV steroids and O2. PT OT pending    Antimicrobials:  Anti-infectives (From admission, onward)    None        Objective: Vitals:   06/22/21 0353 06/22/21 0408 06/22/21 0714 06/22/21 0843  BP:   93/63   Pulse:   87 86  Resp:   18 (!) 22   Temp:   98 F (36.7 C)   TempSrc:   Oral   SpO2: 91%  97% 97%  Weight:  32.7 kg    Height:        Intake/Output Summary (Last 24 hours) at 06/22/2021 1042 Last data filed at 06/21/2021 1800 Gross per 24 hour  Intake 153 ml  Output --  Net 153 ml   Filed Weights   06/20/21 2045 06/22/21 0408  Weight: 31.8 kg 32.7 kg    Examination:  General exam: Appears calm and comfortable, cachectic in appearance Respiratory system: Diminished breath sounds without distress, on nasal cannula O2 Cardiovascular system: S1 & S2 heard, RRR. No murmurs. No pedal edema. Gastrointestinal system: Abdomen is nondistended, soft and nontender. Normal bowel sounds heard. Central nervous system: Alert and oriented. No focal neurological deficits. Speech clear.  Extremities: Symmetric in appearance  Skin: No rashes, lesions or ulcers on exposed skin  Psychiatry: Judgement and insight appear normal. Mood & affect appropriate.   Data Reviewed: I have personally reviewed following labs and imaging studies  CBC: Recent Labs  Lab 06/20/21 1707 06/20/21 2140 06/21/21 0756 06/22/21 0321  WBC 5.6  --   --  5.7  NEUTROABS 4.4  --   --   --   HGB 12.2 12.9 12.6 11.1*  HCT 41.0 38.0 37.0 36.7  MCV 96.0  --   --  92.9  PLT 153  --   --  676*   Basic Metabolic Panel: Recent Labs  Lab 06/20/21 1707 06/20/21 2140 06/21/21 0756 06/22/21 0321  NA 142 144 142 143  K 3.9 3.8 3.9 4.2  CL 106  --   --  104  CO2 28  --   --  31  GLUCOSE 129*  --   --  120*  BUN 29*  --   --  32*  CREATININE 1.23*  --   --  1.02*  CALCIUM 8.2*  --   --  8.6*   GFR: Estimated Creatinine Clearance: 24.2 mL/min (A) (by C-G formula based on SCr of 1.02 mg/dL (H)). Liver Function Tests: Recent Labs  Lab 06/20/21 1707  AST 27  ALT 13  ALKPHOS 59  BILITOT 0.5  PROT 5.6*  ALBUMIN 2.9*   No results for input(s): "LIPASE", "AMYLASE" in the last 168 hours. No results for input(s): "AMMONIA" in the last 168  hours. Coagulation Profile: No results for input(s): "INR", "PROTIME" in the last 168 hours. Cardiac Enzymes: No results for input(s): "CKTOTAL", "CKMB", "CKMBINDEX", "TROPONINI" in the last 168 hours. BNP (last 3 results) No results for input(s): "PROBNP" in the last 8760 hours. HbA1C: No results for input(s): "HGBA1C" in the last 72 hours. CBG: Recent Labs  Lab 06/20/21 2113  GLUCAP 117*   Lipid Profile: No results for input(s): "CHOL", "HDL", "LDLCALC", "TRIG", "CHOLHDL", "LDLDIRECT" in the last 72 hours. Thyroid Function Tests: No results for input(s): "TSH", "T4TOTAL", "FREET4", "T3FREE", "THYROIDAB" in the last 72 hours. Anemia Panel: No results for input(s): "VITAMINB12", "FOLATE", "FERRITIN", "TIBC", "IRON", "RETICCTPCT" in the last 72 hours. Sepsis Labs: No results for input(s): "PROCALCITON", "  LATICACIDVEN" in the last 168 hours.  Recent Results (from the past 240 hour(s))  Respiratory (~20 pathogens) panel by PCR     Status: None   Collection Time: 06/21/21  1:14 PM   Specimen: Nasopharyngeal Swab; Respiratory  Result Value Ref Range Status   Adenovirus NOT DETECTED NOT DETECTED Final   Coronavirus 229E NOT DETECTED NOT DETECTED Final    Comment: (NOTE) The Coronavirus on the Respiratory Panel, DOES NOT test for the novel  Coronavirus (2019 nCoV)    Coronavirus HKU1 NOT DETECTED NOT DETECTED Final   Coronavirus NL63 NOT DETECTED NOT DETECTED Final   Coronavirus OC43 NOT DETECTED NOT DETECTED Final   Metapneumovirus NOT DETECTED NOT DETECTED Final   Rhinovirus / Enterovirus NOT DETECTED NOT DETECTED Final   Influenza A NOT DETECTED NOT DETECTED Final   Influenza B NOT DETECTED NOT DETECTED Final   Parainfluenza Virus 1 NOT DETECTED NOT DETECTED Final   Parainfluenza Virus 2 NOT DETECTED NOT DETECTED Final   Parainfluenza Virus 3 NOT DETECTED NOT DETECTED Final   Parainfluenza Virus 4 NOT DETECTED NOT DETECTED Final   Respiratory Syncytial Virus NOT DETECTED NOT  DETECTED Final   Bordetella pertussis NOT DETECTED NOT DETECTED Final   Bordetella Parapertussis NOT DETECTED NOT DETECTED Final   Chlamydophila pneumoniae NOT DETECTED NOT DETECTED Final   Mycoplasma pneumoniae NOT DETECTED NOT DETECTED Final    Comment: Performed at [facility], [address] [place], [place] [address]  MRSA Next Gen by PCR, Nasal     Status: None   Collection Time: 06/21/21  3:30 PM   Specimen: Nasal Mucosa; Nasal Swab  Result Value Ref Range Status   MRSA by PCR Next Gen NOT DETECTED NOT DETECTED Final    Comment: (NOTE) The GeneXpert MRSA Assay (FDA approved for NASAL specimens only), is one component of a comprehensive MRSA colonization surveillance program. It is not intended to diagnose MRSA infection nor to guide or monitor treatment for MRSA infections. Test performance is not FDA approved in patients less than 31 years old. Performed at [facility], [place] [address] [place], [place] [address]       Radiology Studies: ECHOCARDIOGRAM COMPLETE  Result Date: 06/21/2021    ECHOCARDIOGRAM REPORT   Patient Name:   SIGNA CHEEK Date of Exam: 06/21/2021 Medical Rec #:  [id number]      Height:       60.0 in Accession #:    [id number]     Weight:       70.0 lb Date of Birth:  1945/07/25      BSA:          1.195 m Patient Age:    84 years       BP:           116/81 mmHg Patient Gender: F              HR:           90 bpm. Exam Location:  Inpatient Procedure: 2D Echo, Color Doppler and Cardiac Doppler Indications:    R06.9 DOE  History:        Patient has no prior history of Echocardiogram examinations.                 COPD; Risk Factors:Lung Cancer.  Sonographer:    Raquel Sarna Senior RDCS Referring Phys: [id number] Mandee Pluta YATES  Sonographer Comments: Difficult windows due to very small body habitus IMPRESSIONS  1. Left ventricular ejection fraction,  by estimation, is 55 to 60%. The left ventricle has normal function. The left ventricle has no regional wall motion  abnormalities. Left ventricular diastolic parameters are consistent with Grade II diastolic dysfunction (pseudonormalization). Elevated left atrial pressure.  2. Right ventricular systolic function is mildly reduced. The right ventricular size is mildly enlarged. There is moderately elevated pulmonary artery systolic pressure. The estimated right ventricular systolic pressure is 32.3 mmHg.  3. The mitral valve is abnormal. Moderate to severe mitral valve regurgitation. No evidence of mitral stenosis. Posterior directed MR jet. Normal LA/LV size, low E wave velocity suggests likely moderate MR  4. Tricuspid valve regurgitation is mild to moderate.  5. The aortic valve is tricuspid. Aortic valve regurgitation is trivial. No aortic stenosis is present.  6. The inferior vena cava is dilated in size with <50% respiratory variability, suggesting right atrial pressure of 15 mmHg. FINDINGS  Left Ventricle: Left ventricular ejection fraction, by estimation, is 55 to 60%. The left ventricle has normal function. The left ventricle has no regional wall motion abnormalities. The left ventricular internal cavity size was small. There is no left ventricular hypertrophy. Left ventricular diastolic parameters are consistent with Grade II diastolic dysfunction (pseudonormalization). Elevated left atrial pressure. Right Ventricle: The right ventricular size is mildly enlarged. Right vetricular wall thickness was not well visualized. Right ventricular systolic function is mildly reduced. There is moderately elevated pulmonary artery systolic pressure. The tricuspid  regurgitant velocity is 3.13 m/s, and with an assumed right atrial pressure of 15 mmHg, the estimated right ventricular systolic pressure is 55.7 mmHg. Left Atrium: Left atrial size was normal in size. Right Atrium: Right atrial size was normal in size. Pericardium: There is no evidence of pericardial effusion. Mitral Valve: The mitral valve is abnormal. Moderate to severe  mitral valve regurgitation. No evidence of mitral valve stenosis. Tricuspid Valve: The tricuspid valve is normal in structure. Tricuspid valve regurgitation is mild to moderate. Aortic Valve: The aortic valve is tricuspid. Aortic valve regurgitation is trivial. No aortic stenosis is present. Pulmonic Valve: The pulmonic valve was not well visualized. Pulmonic valve regurgitation is not visualized. Aorta: The aortic root is normal in size and structure. Venous: The inferior vena cava is dilated in size with less than 50% respiratory variability, suggesting right atrial pressure of 15 mmHg. IAS/Shunts: The interatrial septum was not well visualized.  LEFT VENTRICLE PLAX 2D LVIDd:         3.10 cm   Diastology LVIDs:         2.10 cm   LV e' medial:    4.79 cm/s LV PW:         0.80 cm   LV E/e' medial:  19.5 LV IVS:        0.60 cm   LV e' lateral:   5.22 cm/s LVOT diam:     1.70 cm   LV E/e' lateral: 17.9 LV SV:         22 LV SV Index:   18 LVOT Area:     2.27 cm  RIGHT VENTRICLE RV S prime:     9.57 cm/s TAPSE (M-mode): 1.3 cm LEFT ATRIUM             Index        RIGHT ATRIUM          Index LA diam:        2.90 cm 2.43 cm/m   RA Area:     9.69 cm LA Vol (A2C):   30.3  ml 25.36 ml/m  RA Volume:   21.40 ml 17.91 ml/m LA Vol (A4C):   29.7 ml 24.86 ml/m LA Biplane Vol: 30.4 ml 25.44 ml/m  AORTIC VALVE LVOT Vmax:   63.80 cm/s LVOT Vmean:  47.400 cm/s LVOT VTI:    0.096 m  AORTA Ao Root diam: 3.00 cm MITRAL VALVE                  TRICUSPID VALVE MV Area (PHT): 4.93 cm       TR Peak grad:   39.2 mmHg MV Decel Time: 154 msec       TR Vmax:        313.00 cm/s MR Peak grad:    72.2 mmHg MR Mean grad:    50.0 mmHg    SHUNTS MR Vmax:         425.00 cm/s  Systemic VTI:  0.10 m MR Vmean:        329.0 cm/s   Systemic Diam: 1.70 cm MR PISA:         1.90 cm MR PISA Eff ROA: 16 mm MR PISA Radius:  0.55 cm MV E velocity: 93.60 cm/s MV A velocity: 79.60 cm/s MV E/A ratio:  1.18 Oswaldo Milian MD Electronically signed by  Oswaldo Milian MD Signature Date/Time: 06/21/2021/3:11:35 PM    Final    CT Angio Chest PE W and/or Wo Contrast  Result Date: 06/21/2021 CLINICAL DATA:  76 year old female status post syncope and fall. History of lung cancer, left lower lobe radiation fibrosis, emphysema. EXAM: CT ANGIOGRAPHY CHEST WITH CONTRAST TECHNIQUE: Multidetector CT imaging of the chest was performed using the standard protocol during bolus administration of intravenous contrast. Multiplanar CT image reconstructions and MIPs were obtained to evaluate the vascular anatomy. RADIATION DOSE REDUCTION: This exam was performed according to the departmental dose-optimization program which includes automated exposure control, adjustment of the mA and/or kV according to patient size and/or use of iterative reconstruction technique. CONTRAST:  70mL OMNIPAQUE IOHEXOL 350 MG/ML SOLN COMPARISON:  Restaging chest CT 05/05/2020. FINDINGS: Cardiovascular: Excellent contrast bolus timing in the pulmonary arterial tree. No focal filling defect identified in the pulmonary arteries to suggest acute pulmonary embolism. Calcified aortic atherosclerosis. Calcified coronary artery atherosclerosis. No cardiomegaly. No definite pericardial effusion. However, there is a small volume of contrast reflux into the hepatic IVC and hepatic veins. Mediastinum/Nodes: No definite mediastinal mass or lymphadenopathy. Lungs/Pleura: New moderate-sized right pleural effusion, mostly layering and with simple fluid density. Chronic but increased small left side pleural effusion, chronically somewhat loculated at the lung base (series 5, image 88). Major airways remain patent but there is progressive consolidation of the left lower lobe from last year. Underlying centrilobular emphysema. Mild diffuse increased pulmonary interstitial density in both lungs. Mild compressive atelectasis also on the right. Upper Abdomen: Negative visible centrally noncontrast liver, spleen,  pancreas, adrenal glands, kidneys, stomach in the upper abdomen. Musculoskeletal: Osteopenia. Chronic T9 through T12 compression fractures, some previously augmented. No acute or suspicious osseous lesion identified. Review of the MIP images confirms the above findings. IMPRESSION: 1. Negative for acute pulmonary embolus. 2. Progressed left lower lobe consolidation since last year, superimposed on post treatment pulmonary fibrosis. This might be atelectasis (see #3), but Pneumonia is not excluded. 3. Constellation suspicious for a degree of pulmonary edema and right heart failure superimposed on chronic lung disease with Emphysema (ICD10-J43.9), including new moderate size right pleural effusion and increased partially loculated left pleural effusion. 4. Calcified coronary artery and Aortic Atherosclerosis (ICD10-I70.0).  Electronically Signed   By: Genevie Ann M.D.   On: 06/21/2021 06:22   CT Cervical Spine Wo Contrast  Result Date: 06/21/2021 CLINICAL DATA:  Neck trauma (Age >= 65y).  Syncope, fall EXAM: CT CERVICAL SPINE WITHOUT CONTRAST TECHNIQUE: Multidetector CT imaging of the cervical spine was performed without intravenous contrast. Multiplanar CT image reconstructions were also generated. RADIATION DOSE REDUCTION: This exam was performed according to the departmental dose-optimization program which includes automated exposure control, adjustment of the mA and/or kV according to patient size and/or use of iterative reconstruction technique. COMPARISON:  None Available. FINDINGS: Alignment: No subluxation Skull base and vertebrae: No acute fracture. No primary bone lesion or focal pathologic process. Soft tissues and spinal canal: No prevertebral fluid or swelling. No visible canal hematoma. Disc levels: Diffuse degenerative disc disease with disc space narrowing and early spurring. Upper chest: Centrilobular emphysema. Bilateral pleural effusions partially imaged. Other: None IMPRESSION: No acute bony  abnormality. Layering bilateral pleural effusions.  Emphysema. Electronically Signed   By: Rolm Baptise M.D.   On: 06/21/2021 00:14   CT Head Wo Contrast  Result Date: 06/21/2021 CLINICAL DATA:  Head trauma, minor (Age >= 65y).  Syncope, fall EXAM: CT HEAD WITHOUT CONTRAST TECHNIQUE: Contiguous axial images were obtained from the base of the skull through the vertex without intravenous contrast. RADIATION DOSE REDUCTION: This exam was performed according to the departmental dose-optimization program which includes automated exposure control, adjustment of the mA and/or kV according to patient size and/or use of iterative reconstruction technique. COMPARISON:  None Available. FINDINGS: Brain: No acute intracranial abnormality. Specifically, no hemorrhage, hydrocephalus, mass lesion, acute infarction, or significant intracranial injury. Vascular: No hyperdense vessel or unexpected calcification. Skull: No acute calvarial abnormality. Sinuses/Orbits: No acute findings Other: None IMPRESSION: No acute intracranial abnormality. Electronically Signed   By: Rolm Baptise M.D.   On: 06/21/2021 00:08   DG Chest 2 View  Result Date: 06/20/2021 CLINICAL DATA:  Fall and syncope EXAM: CHEST - 2 VIEW COMPARISON:  01/25/2018, CT chest 05/05/2020 FINDINGS: Advanced emphysema. Small bilateral pleural effusions which appear slightly loculated. Distortion in the left infrahilar lung corresponding to post therapy changes on recent CT. New left suprahilar somewhat nodular opacity. Stable cardiomediastinal silhouette. Vascular congestion and diffuse interstitial opacity which could be due to mild edema. IMPRESSION: 1. Increased small bilateral pleural effusion which appears slightly loculated. Diffuse increased interstitial opacity which may be due to mild edema 2. Emphysema with similar left infrahilar distortion and consolidation. New somewhat nodular opacity in the left suprahilar lung which could be due to a focus of pneumonia  or possibly a lung nodule. Correlation with CT is suggested. Electronically Signed   By: Donavan Foil M.D.   On: 06/20/2021 21:46      Scheduled Meds:  feeding supplement  237 mL Oral BID BM   heparin injection (subcutaneous)  5,000 Units Subcutaneous Q8H   ipratropium-albuterol  3 mL Nebulization Q6H   methylPREDNISolone (SOLU-MEDROL) injection  40 mg Intravenous Q12H   nicotine  14 mg Transdermal Daily   sodium chloride flush  3 mL Intravenous Q12H   Continuous Infusions:   LOS: 1 day     Dessa Phi, DO [facility] 06/22/2021, 10:42 AM   Available via Epic secure chat 7am-7pm After these hours, please refer to coverage provider listed on [email or web address]

## 2021-06-22 NOTE — Progress Notes (Addendum)
NAME:  Sherry [name], MRN:  [id number], DOB:  02/04/1945, LOS: 1 ADMISSION DATE:  06/20/2021, CONSULTATION DATE:  06/21/21 REFERRING MD:  Lorin Mercy CHIEF COMPLAINT:  Dyspnea   History of Present Illness:  Sherry [name] is a 76 y.o. female who has a PMH as below including but not limited to stage IIb/IIIa (T3, N0/N1, M0) NSCLC diagnosed August 2019 s/p chemo, XRT, and immunotherapy (followed by Dr. Julien Nordmann, currently on observation and was supposed to have f/u CT chest Nov 2022 but never did), emphysema, ongoing tobacco dependence.  She presented to [facility] ED 6/12 after syncopal episode at [facility]. She apparently lost consciousness for 15 minutes  In ED, she was slightly hypoxic and required 1 - 3L O2. CTA chest was negative for PE but showed atelectasis, chronic fibrosis, bilateral pleural effusions. She was initially acidotic and hypercapnic and BiPAP was ordered which she refused. Repeat ABG AM 6/13 showed improvement with normal pH but remained hypercapnic to 60s. CT head and C-spine were negative.  PCCM called for pulmonary consult 6/13. She denies any recent illness. Might have had a fever a week ago but doesn't know for sure. Has had chronic mild cough productive of white sputum, no changes. Denies chills/sweats, N/V/D, abd pain, myalgias, exposures to known sick contacts.  Of note, she does have DNR on file and confirms these wishes.  Pertinent  Medical History:  has Mediastinal mass; Primary malignant neoplasm of bronchus of left lower lobe ([place]); Pneumothorax; Encounter for antineoplastic chemotherapy; Goals of care, counseling/discussion; Malnutrition ([place]); Angular cheilitis; Cellulitis of right knee; Acute right-sided thoracic back pain; Thoracic compression fracture ([place]); Osteoporosis; Depressive disorder; Migraine; COPD with acute exacerbation ([place]); Stage III squamous cell carcinoma of left lung ([place]); Encounter for antineoplastic immunotherapy; Constipation; Acute respiratory failure ([place]);  Syncope and collapse; Tobacco dependence; and DNR (do not resuscitate) on their problem list.  Significant Hospital Events: Including procedures, antibiotic start and stop dates in addition to other pertinent events   6/13 admit  Interim History / Subjective:  No acute distress  Objective:  Blood pressure 93/63, pulse 86, temperature 98 F (36.7 C), temperature source Oral, resp. rate (!) 22, height 5' (1.524 m), weight 32.7 kg, SpO2 97 %.    FiO2 (%):  [24 %-28 %] 28 %   Intake/Output Summary (Last 24 hours) at 06/22/2021 0855 Last data filed at 06/21/2021 1800 Gross per 24 hour  Intake 153 ml  Output --  Net 153 ml   Filed Weights   06/20/21 2045 06/22/21 0408  Weight: 31.8 kg 32.7 kg    Examination: General: Frail cachectic female no acute distress HEENT: MM pink/moist no JVD is appreciated Neuro: Somewhat strange affect otherwise intact CV: Heart sounds are regular PULM: Diminished throughout with rhonchi  GI: soft, bsx4 active  GU: Voids Extremities: warm/dry, negative edema  Skin: no rashes or lesions   Labs/imaging personally reviewed:    Assessment & Plan:   Emphysema with ongoing tobacco dependence. Continue bronchodilators and steroids Continue nicotine patch Counseling for tobacco abuse Continue O2 as needed Follow-up with pulmonary      Acute hypoxic and hypercapnic respiratory failure - 2/2 above      O2 as needed but was not on home oxygen Pulmonary toilet Respiratory virus panel is negative   Hx NSCLC s/p chemo, XRT, immunotherapy (followed by Dr. Julien Nordmann). Follow-up with heme-onc as outpatient  Goals of care: Continue supportive care. DNR confirmed with pt.  Rest per primary team.   Best practice (evaluated daily):  Per primary team.  Labs   CBC: Recent Labs  Lab 06/20/21 1707 06/20/21 2140 06/21/21 0756 06/22/21 0321  WBC 5.6  --   --  5.7  NEUTROABS 4.4  --   --   --   HGB 12.2 12.9 12.6 11.1*  HCT 41.0 38.0 37.0  36.7  MCV 96.0  --   --  92.9  PLT 153  --   --  149*    Basic Metabolic Panel: Recent Labs  Lab 06/20/21 1707 06/20/21 2140 06/21/21 0756 06/22/21 0321  NA 142 144 142 143  K 3.9 3.8 3.9 4.2  CL 106  --   --  104  CO2 28  --   --  31  GLUCOSE 129*  --   --  120*  BUN 29*  --   --  32*  CREATININE 1.23*  --   --  1.02*  CALCIUM 8.2*  --   --  8.6*   GFR: Estimated Creatinine Clearance: 24.2 mL/min (A) (by C-G formula based on SCr of 1.02 mg/dL (H)). Recent Labs  Lab 06/20/21 1707 06/22/21 0321  WBC 5.6 5.7    Liver Function Tests: Recent Labs  Lab 06/20/21 1707  AST 27  ALT 13  ALKPHOS 59  BILITOT 0.5  PROT 5.6*  ALBUMIN 2.9*   No results for input(s): "LIPASE", "AMYLASE" in the last 168 hours. No results for input(s): "AMMONIA" in the last 168 hours.  ABG    Component Value Date/Time   PHART 7.317 (L) 06/21/2021 0756   PCO2ART 62.8 (H) 06/21/2021 0756   PO2ART 110 (H) 06/21/2021 0756   HCO3 32.2 (H) 06/21/2021 0756   TCO2 34 (H) 06/21/2021 0756   O2SAT 98 06/21/2021 0756     Coagulation Profile: No results for input(s): "INR", "PROTIME" in the last 168 hours.  Cardiac Enzymes: No results for input(s): "CKTOTAL", "CKMB", "CKMBINDEX", "TROPONINI" in the last 168 hours.  HbA1C: No results found for: "HGBA1C"  CBG: Recent Labs  Lab 06/20/21 2113  Treutlen Minor ACNP Acute Care Nurse Practitioner [place] Please consult Amion 06/22/2021, 8:55 AM     I personally saw the patient and performed a substantive portion of this encounter, including a complete performance of at least one of the key components (MDM, Hx and/or Exam), in conjunction with the Advanced Practice Provider.    76 year old smoker admitted 6/12 after syncopal episode at [facility] with LOC for 15 minutes per patient.  She has no recollection of prodromal symptoms, reports chronic shortness of breath, no preceding URI or  fevers Initial ABG of 7.2 1/77/80 , refused BiPAP, follow-up ABG in 8 hours was 732/63/110 Troponin was 93, no leukocytosis .  She was placed on nasal cannula. PMH -left lower lobe squamous cell lung cancer diagnosed 2019 status post chemotherapy, radiation and immunotherapy, currently on observation Last CT chest 04/2020 showed stable changes of radiation fibrosis in the left lower lung and small loculated left pleural effusion with severe emphysema and chronic vertebral fractures status post kyphoplasty   CT angiogram chest on admission showed progressive left lower lobe consolidation with new moderate right effusion and increased partially loculated left effusion, with increased diffuse interstitial prominence concerning for pulmonary edema  Echo showed grade 2 diastolic dysfunction with moderate MR BNP was high  On exam -thin frail woman, sitting up in bed, decreased breath sounds bilateral, no rhonchi, S1-S2 regular, soft nontender abdomen, no edema.  Impression/plan  Acute hypoxic respiratory failure COPD exacerbation -can switch to oral prednisone and rapidly taper once bronchospasm resolves. -She should be discharged on triple therapy-either Trelegy or Breztri depending on insurance coverage  Bilateral effusions -likely due to acute on chronic diastolic heart failure and severe MR. -We will need diuresis and cardiology input.  PCCM will be available as needed. Leanna Sato Elsworth Soho MD

## 2021-06-22 NOTE — Progress Notes (Signed)
Initial Nutrition Assessment  DOCUMENTATION CODES:   Underweight, Severe malnutrition in context of chronic illness  INTERVENTION:   - Ensure Enlive po BID, each supplement provides 350 kcal and 20 grams of protein  - Magic Cup BID with meals, each supplement provides 290 kcal and 9 grams of protein  - Liberalize diet to Regular  - Recommend MVI with minerals daily, pt refused  NUTRITION DIAGNOSIS:   Severe Malnutrition related to chronic illness (COPD, NSCLC) as evidenced by severe muscle depletion, severe fat depletion.  GOAL:   Patient will meet greater than or equal to 90% of their needs  MONITOR:   PO intake, Supplement acceptance, Labs, Weight trends  REASON FOR ASSESSMENT:   Malnutrition Screening Tool, Consult COPD Protocol  ASSESSMENT:   76 year old female who presented to the ED on 6/12 after a syncopal episode and fall. PMH of COPD, depression, anxiety, tobacco abuse, stage IIb/IIIa NSCLC s/p chemotherapy, radiation, and immunotherapy currently on observation.  Spoke with pt at bedside. Pt reports good appetite today but with several complaints about breakfast meal tray and why she was unable to eat various food items. Pt reports that she requested a pancake to be brought up on a separate tray but has not yet received this. Noted pt with the applesauce, juice, and banana from her breakfast meal tray on tray table along with a chocolate Ensure supplement.  Pt reports good appetite and PO intake PTA. She states that she has been eating her baseline of 5 small meals daily. A meal may include peanut butter crackers OR an oatmeal cream pie OR cornbread and buttermilk OR a crunchy taco with pinto beans and cheese from [employer]. Pt shares that she drinks water, coffee, buttermilk, and sweet tea. Pt does not consume oral nutrition supplements at home.  Pt reports UBW of 110 lbs and that she last weighed this prior to chemotherapy and radiation back in 2019-2020. Pt  reports losing weight down to 70 lbs throughout the course of cancer treatments. She states that she has been unable to gain weight back since that time. She is pleased with her current weight of 72 lbs as this is up slightly compared to previous encounters. Reviewed weight history in chart. Current weight is up compared to weights from 2021.  Pt denies any issues chewing or swallowing food but reports occasional issues with swallowing pills. She reports that her bowel movements are regular and denies any issues with constipation or diarrhea.  Pt willing to consume oral nutrition supplements in the hospital. Will continue Ensure Enlive BID and will add Magic Cups. RD will also liberalize diet to Regular to provide the most options at mealtimes. Pt refuses MVI with minerals because these "fill me up" and she doesn't eat if she takes the MVI.  Meal Completion: 90% x 1 documented meal  Medications reviewed and include: Ensure Enlive BID, IV solu-medrol  Labs reviewed: BUN 32, creatinine 1.02  NUTRITION - FOCUSED PHYSICAL EXAM:  Flowsheet Row Most Recent Value  Orbital Region Severe depletion  Upper Arm Region Severe depletion  Thoracic and Lumbar Region Severe depletion  Buccal Region Severe depletion  Temple Region Severe depletion  Clavicle Bone Region Severe depletion  Clavicle and Acromion Bone Region Severe depletion  Scapular Bone Region Severe depletion  Dorsal Hand Severe depletion  Patellar Region Severe depletion  Anterior Thigh Region Severe depletion  Posterior Calf Region Severe depletion  Edema (RD Assessment) None  Hair Reviewed  Eyes Reviewed  Mouth Reviewed  Skin  Reviewed  Nails Reviewed       Diet Order:   Diet Order             Diet regular Room service appropriate? Yes; Fluid consistency: Thin  Diet effective now                   EDUCATION NEEDS:   Education needs have been addressed  Skin:  Skin Assessment: Reviewed RN Assessment  Last BM:   06/20/21  Height:   Ht Readings from Last 1 Encounters:  06/20/21 5' (1.524 m)    Weight:   Wt Readings from Last 1 Encounters:  06/22/21 32.7 kg    BMI:  Body mass index is 14.08 kg/m.  Estimated Nutritional Needs:   Kcal:  1400-1600  Protein:  55-65 grams  Fluid:  1.4-1.6 L    Gustavus Bryant, MS, RD, LDN Inpatient Clinical Dietitian Please see AMiON for contact information.

## 2021-06-23 ENCOUNTER — Encounter: Payer: Self-pay | Admitting: Internal Medicine

## 2021-06-23 ENCOUNTER — Inpatient Hospital Stay: Payer: Medicare Other

## 2021-06-23 ENCOUNTER — Other Ambulatory Visit: Payer: Self-pay | Admitting: Cardiology

## 2021-06-23 ENCOUNTER — Other Ambulatory Visit (HOSPITAL_COMMUNITY): Payer: Self-pay

## 2021-06-23 DIAGNOSIS — R55 Syncope and collapse: Secondary | ICD-10-CM

## 2021-06-23 DIAGNOSIS — I340 Nonrheumatic mitral (valve) insufficiency: Secondary | ICD-10-CM

## 2021-06-23 LAB — BASIC METABOLIC PANEL
Anion gap: 11 (ref 5–15)
BUN: 29 mg/dL — ABNORMAL HIGH (ref 8–23)
CO2: 35 mmol/L — ABNORMAL HIGH (ref 22–32)
Calcium: 9 mg/dL (ref 8.9–10.3)
Chloride: 102 mmol/L (ref 98–111)
Creatinine, Ser: 1.08 mg/dL — ABNORMAL HIGH (ref 0.44–1.00)
GFR, Estimated: 53 mL/min — ABNORMAL LOW (ref >60)
Glucose, Bld: 138 mg/dL — ABNORMAL HIGH (ref 70–99)
Potassium: 3.6 mmol/L (ref 3.5–5.1)
Sodium: 148 mmol/L — ABNORMAL HIGH (ref 135–145)

## 2021-06-23 MED ORDER — PREDNISONE 10 MG PO TABS
ORAL_TABLET | ORAL | 0 refills | Status: DC
  Filled 2021-06-23: qty 32, 16d supply, fill #0

## 2021-06-23 MED ORDER — FUROSEMIDE 10 MG/ML IJ SOLN
40.0000 mg | Freq: Once | INTRAMUSCULAR | Status: AC
  Administered 2021-06-23: 40 mg via INTRAVENOUS
  Filled 2021-06-23: qty 4

## 2021-06-23 MED ORDER — TRELEGY ELLIPTA 100-62.5-25 MCG/ACT IN AEPB
1.0000 | INHALATION_SPRAY | Freq: Every day | RESPIRATORY_TRACT | 1 refills | Status: DC
  Filled 2021-06-23: qty 60, 30d supply, fill #0

## 2021-06-23 MED ORDER — IPRATROPIUM-ALBUTEROL 0.5-2.5 (3) MG/3ML IN SOLN: 3.0000 mL | Freq: Two times a day (BID) | RESPIRATORY_TRACT | Status: DC

## 2021-06-23 MED ORDER — PREDNISONE 50 MG PO TABS: 50.0000 mg | ORAL_TABLET | Freq: Every day | ORAL | Status: DC

## 2021-06-23 MED ORDER — POTASSIUM CHLORIDE CRYS ER 20 MEQ PO TBCR: 40.0000 meq | EXTENDED_RELEASE_TABLET | Freq: Once | ORAL | Status: DC

## 2021-06-23 MED ORDER — NICOTINE 7 MG/24HR TD PT24
7.0000 mg | MEDICATED_PATCH | Freq: Every day | TRANSDERMAL | 0 refills | Status: DC
  Filled 2021-06-23: qty 28, 28d supply, fill #0

## 2021-06-23 MED ORDER — ALBUTEROL SULFATE HFA 108 (90 BASE) MCG/ACT IN AERS
2.0000 | INHALATION_SPRAY | Freq: Four times a day (QID) | RESPIRATORY_TRACT | 1 refills | Status: DC | PRN
  Filled 2021-06-23: qty 8.5, 25d supply, fill #0

## 2021-06-23 MED ORDER — MECLIZINE HCL 12.5 MG PO TABS
12.5000 mg | ORAL_TABLET | Freq: Two times a day (BID) | ORAL | 0 refills | Status: DC | PRN
  Filled 2021-06-23: qty 30, 15d supply, fill #0

## 2021-06-23 NOTE — Progress Notes (Signed)
Physical Therapy Treatment Patient Details Name: Sherry [name] MRN: [id number] DOB: [birth year] Today's Date: 06/23/2021   History of Present Illness Pt is a 76 yo female presenting s/p syncopal episode and collapse. Pt was admitted on 6/12 for syncope and collapse with possible COPD exacerbation. CTA of chest revealed atelectasis, chronic fibrosis, and bilateral pleural effusions. PMH includes stage 3 non-small cell lung cancer, COPD, and emphysema.    PT Comments    Pt admitted with above diagnosis. Pt was able to tolerate Epley maneuver for right posterior canal BPPV.  Pt reports all symptoms resolved post treatment.  MD:  Please give prescription to pt for Outpt PT and she can call if symptoms return.   Pt continues to need 4LO2 for DOE 3/4. Pt is most likely close to baseline functionally.  Pt getting ready to go home today.  Pt currently with functional limitations due to balance and endurance deficits. Pt will benefit from skilled PT to increase their independence and safety with mobility to allow discharge to the venue listed below.      Recommendations for follow up therapy are one component of a multi-disciplinary discharge planning process, led by the attending physician.  Recommendations may be updated based on patient status, additional functional criteria and insurance authorization.  Follow Up Recommendations  Outpatient PT (vestibular Rehab)     Assistance Recommended at Discharge PRN  Patient can return home with the following A little help with walking and/or transfers;A little help with bathing/dressing/bathroom;Assistance with cooking/housework;Assist for transportation   Equipment Recommendations  None recommended by PT    Recommendations for Other Services       Precautions / Restrictions Precautions Precautions: Fall;Other (comment) Precaution Comments: Watch O2 Restrictions Weight Bearing Restrictions: No     Mobility  Bed Mobility Overal bed mobility:  Modified Independent Bed Mobility: Rolling, Sidelying to Sit Rolling: Supervision Sidelying to sit: Supervision       General bed mobility comments: supervision    Transfers Overall transfer level: Modified independent Equipment used: Rolling walker (2 wheels) Transfers: Sit to/from Stand Sit to Stand: Supervision           General transfer comment: from EOB x1, from toilet x1. supervision only wihtout device.    Ambulation/Gait Ambulation/Gait assistance: Supervision Gait Distance (Feet): 40 Feet (20 feet x 2) Assistive device: Rolling walker (2 wheels), None Gait Pattern/deviations: Step-through pattern, Decreased stride length, Narrow base of support Gait velocity: decreased Gait velocity interpretation: <1.31 ft/sec, indicative of household ambulator   General Gait Details: pt with slow but steady gait pattern, supervision without device.  Dizziness resolved with treatment per pt.   Stairs             Wheelchair Mobility    Modified Rankin (Stroke Patients Only)       Balance Overall balance assessment: Needs assistance Sitting-balance support: Feet supported, No upper extremity supported Sitting balance-Leahy Scale: Fair     Standing balance support: During functional activity, Single extremity supported Standing balance-Leahy Scale: Fair Standing balance comment: can stand statically without UE support                            Cognition Arousal/Alertness: Awake/alert Behavior During Therapy: WFL for tasks assessed/performed Overall Cognitive Status: Within Functional Limits for tasks assessed  General Comments: pt agreeable for mobility and understands need        Exercises Other Exercises Other Exercises: Gave pt Nestor Lewandowsky and self epley maneuver handout and demonstrated and educated.    General Comments General comments (skin integrity, edema, etc.): Hallpike Dix positive  for right posterior canalilithiasis.  Treated with Epley maneuver with pt having no symptoms post treatment.      Pertinent Vitals/Pain Pain Assessment Pain Assessment: No/denies pain    Home Living                          Prior Function            PT Goals (current goals can now be found in the care plan section) Acute Rehab PT Goals Patient Stated Goal: to go home Progress towards PT goals: Progressing toward goals    Frequency    Min 3X/week      PT Plan Current plan remains appropriate    Co-evaluation              AM-PAC PT "6 Clicks" Mobility   Outcome Measure  Help needed turning from your back to your side while in a flat bed without using bedrails?: A Little Help needed moving from lying on your back to sitting on the side of a flat bed without using bedrails?: A Little Help needed moving to and from a bed to a chair (including a wheelchair)?: A Little Help needed standing up from a chair using your arms (e.g., wheelchair or bedside chair)?: A Little Help needed to walk in hospital room?: A Little Help needed climbing 3-5 steps with a railing? : A Little 6 Click Score: 18    End of Session Equipment Utilized During Treatment: Gait belt Activity Tolerance: Patient tolerated treatment well Patient left: in bed;with call bell/phone within reach Nurse Communication: Mobility status PT Visit Diagnosis: Unsteadiness on feet (R26.81);Other abnormalities of gait and mobility (R26.89)     Time: 2119-4174 PT Time Calculation (min) (ACUTE ONLY): 31 min  Charges:  $Therapeutic Exercise: 8-22 mins $Canalith Rep Proc: 8-22 mins                     Yazmin Locher M,PT Acute Rehab Services [phone]    Alvira Philips 06/23/2021, 1:04 PM

## 2021-06-23 NOTE — Discharge Summary (Signed)
Physician Discharge Summary  Sherry [name] HQI:[id number] DOB: 20-Apr-1945 DOA: 06/20/2021  PCP: [name], NP  Admit date: 06/20/2021 Discharge date: 06/23/2021  Admitted From: Home Disposition:  Home with outpatient PT   Recommendations for Outpatient Follow-up:  Follow up with PCP in 1 week Follow up with pulmonology Dr. Silas Flood 6/19 Follow-up with cardiology Almyra Deforest PA 7/7.  They will arrange for outpatient monitor Follow-up with oncology, Dr. Julien Nordmann  Discharge Condition: Stable CODE STATUS: DNR Diet recommendation: Heart healthy diet  Brief/Interim Summary: Sherry [name] is a 76 y.o. female with medical history significant of COPD and NSCLC presenting with syncope.  She was at [facility] and she got to the freezer section.  She was seeing multiple colors and felt nauseated.  She went to the bathroom and doesn't remember anything else.  EMS reported that she lost consciousness for 15 minutes.  No prior h/o similar.  For the last few weeks, her feet are swollen and her feet and legs have been jumping.  She was feeling well yesterday but she got very cold when she went in to the store.  No SOB.  No orthopnea.  +fever, maybe 2 weeks ago, 100.1, lasted for 1 day.  +cough, mild.  She was on chemo/rads for lung cancer, she went off for 6 months.  She was supposed to go back in November for CT and she didn't go.  She sees Dr. Julien Nordmann.   CTA chest negative for PE. Showing progressed left lower lobe consolidation since last year, superimposed on post treatment pulmonary fibrosis. This might be atelectasis but Pneumonia is not excluded. Constellation suspicious for a degree of pulmonary edema and right heart failure superimposed on chronic lung disease with Emphysema, including new moderate size right pleural effusion and increased partially loculated left pleural effusion.  Pulmonology as well as cardiology were consulted.  Patient underwent echocardiogram which showed grade 2 diastolic  dysfunction, no aortic stenosis, moderate to severe mitral regurgitation.  Patient was given 1 dose of IV Lasix as well as potassium replacement.  She was placed on Solu-Medrol which was weaned to steroid taper.  She was arranged for home oxygen on discharge.  Discharge Diagnoses:   Principal Problem:   Syncope and collapse Active Problems:   Malnutrition ([place])   COPD with acute exacerbation (HCC)   Stage III squamous cell carcinoma of left lung (HCC)   Tobacco dependence   DNR (do not resuscitate)   Acute on chronic diastolic CHF (congestive heart failure) (HCC)   Chronic kidney disease, stage 3a (HCC)   Vertigo   Protein-calorie malnutrition, severe   Syncope -Could be likely related to respiratory compromise -Echocardiogram revealed grade 2 diastolic dysfunction, no aortic stenosis -PT OT recommended outpatient PT -Cardiology team arrange for outpatient monitor  Acute hypoxemic respiratory failure secondary to COPD, CHF -Does not wear oxygen at baseline.  Home oxygen ordered -Pulmonology following -Continue prednisone taper, trelegy    Acute on chronic diastolic heart failure, mod-severe MR  -Echocardiogram revealed grade 2 diastolic dysfunction -BNP 1691.3 -Has intermittent pedal edema -Discussed fluid restriction diet with patient -Cardiology consulted, patient given 1 dose of IV Lasix and potassium replacement -Follow-up outpatient  NSCLC -Stage IIb/IIIa poorly differentiated NSCLC diagnosed in 08/2017 -Treated with concurrent chemoradiation with carboplatin, paclitaxel, and immunotherapy with Imfinzi -Last saw pulm on 05/10/20, will need to follow-up on discharge  Malnutrition -Estimated body mass index is 14.08 kg/m as calculated from the following:   Height as of this encounter: 5' (1.524 m).  Weight as of this encounter: 32.7 kg.   Tobacco dependence -Cessation counseling, nicotine patch   CKD stage IIIa -Stable   Vertigo -Meclizine as  needed  Discharge Instructions  Discharge Instructions     (HEART FAILURE PATIENTS) Call MD:  Anytime you have any of the following symptoms: 1) 3 pound weight gain in 24 hours or 5 pounds in 1 week 2) shortness of breath, with or without a dry hacking cough 3) swelling in the hands, feet or stomach 4) if you have to sleep on extra pillows at night in order to breathe.   Complete by: As directed    Ambulatory referral to Physical Therapy   Complete by: As directed    Call MD for:  difficulty breathing, headache or visual disturbances   Complete by: As directed    Call MD for:  extreme fatigue   Complete by: As directed    Call MD for:  persistant dizziness or light-headedness   Complete by: As directed    Call MD for:  persistant nausea and vomiting   Complete by: As directed    Call MD for:  severe uncontrolled pain   Complete by: As directed    Call MD for:  temperature >100.4   Complete by: As directed    Diet - low sodium heart healthy   Complete by: As directed    Discharge instructions   Complete by: As directed    You were cared for by a hospitalist during your hospital stay. If you have any questions about your discharge medications or the care you received while you were in the hospital after you are discharged, you can call the unit and ask to speak with the hospitalist on call if the hospitalist that took care of you is not available. Once you are discharged, your primary care physician will handle any further medical issues. Please note that NO REFILLS for any discharge medications will be authorized once you are discharged, as it is imperative that you return to your primary care physician (or establish a relationship with a primary care physician if you do not have one) for your aftercare needs so that they can reassess your need for medications and monitor your lab values.   Increase activity slowly   Complete by: As directed       Allergies as of 06/23/2021        Reactions   Omnipaque [iohexol] Hives, Itching   Patient had a late reaction of itching and hives after scan will need premeds   Penicillins Hives   Has patient had a PCN reaction causing immediate rash, facial/tongue/throat swelling, SOB or lightheadedness with hypotension: no Has patient had a PCN reaction causing severe rash involving mucus membranes or skin necrosis: No Has patient had a PCN reaction that required hospitalization: No Has patient had a PCN reaction occurring within the last 10 years: No If all of the above answers are "NO", then may proceed with Cephalosporin use.        Medication List     TAKE these medications    acetaminophen 650 MG CR tablet Commonly known as: Tylenol 8 Hour Take 1 tablet (650 mg total) by mouth every 8 (eight) hours as needed for pain or fever.   albuterol 108 (90 Base) MCG/ACT inhaler Commonly known as: VENTOLIN HFA Inhale 2 puffs into the lungs every 6 (six) hours as needed for wheezing or shortness of breath.   HYDROcodone-acetaminophen 5-325 MG tablet Commonly known as: NORCO/VICODIN Take  1 tablet by mouth every 6 (six) hours as needed for moderate pain.   meclizine 12.5 MG tablet Commonly known as: ANTIVERT Take 1 tablet (12.5 mg total) by mouth 2 (two) times daily as needed for dizziness (vertigo).   nicotine 7 mg/24hr patch Commonly known as: NICODERM CQ - dosed in mg/24 hr Place 1 patch (7 mg total) onto the skin daily.   predniSONE 10 MG tablet Commonly known as: DELTASONE Take 4 tabs for 3 days, then 3 tabs for 3 days, then 2 tabs for 3 days, then 1 tab for 3 days, then 1/2 tab for 4 days. What changed:  medication strength additional instructions   Trelegy Ellipta 100-62.5-25 MCG/ACT Aepb Generic drug: Fluticasone-Umeclidin-Vilant Inhale 1 Inhalation into the lungs daily.               Durable Medical Equipment  (From admission, onward)           Start     Ordered   06/23/21 1057  For home use only  DME oxygen  Once       Question Answer Comment  Length of Need 6 Months   Mode or (Route) Nasal cannula   Liters per Minute 2   Frequency Continuous (stationary and portable oxygen unit needed)   Oxygen delivery system Gas      06/23/21 1056            Follow-up Information     Almyra Deforest, PA Follow up on 07/15/2021.   Specialties: Cardiology, Radiology Why: at 10am for your follow up appt with Dr. Rachel Bo' [place] information: [address] [place] [address] [phone]         [facility] Follow up.   Specialty: Rehabilitation Why: Your outpatient referral has been sent to [facility]. The office will call you to establish care. If you have any questions please call number listed above. Contact information: [address] [id number] [place] [phone]        Hunsucker, Bonna Gains, MD. Go on 06/27/2021.   Specialty: Pulmonary Disease Contact information: [address] [place] [address] [phone]         Curt Bears, MD Follow up.   Specialty: Oncology Contact information: [place] [address] [phone]         [name], NP Follow up.   Specialty: Nurse Practitioner Contact information: [place] [address] [phone]                Allergies  Allergen Reactions   Omnipaque [Iohexol] Hives and Itching    Patient had a late reaction of itching and hives after scan will need premeds   Penicillins Hives    Has patient had a PCN reaction causing immediate rash, facial/tongue/throat swelling, SOB or lightheadedness with hypotension: no Has patient had a PCN reaction causing severe rash involving mucus membranes or skin necrosis: No Has patient had a PCN reaction that required hospitalization: No Has patient had a PCN reaction occurring within the last  10 years: No If all of the above answers are "NO", then may proceed with Cephalosporin use.     Consultations: Pulmonology Cardiology   Procedures/Studies: ECHOCARDIOGRAM COMPLETE  Result Date: 06/21/2021    ECHOCARDIOGRAM REPORT   Patient Name:   MELISSA TOMASELLI Date of Exam: 06/21/2021 Medical Rec #:  [id number]      Height:  60.0 in Accession #:    [id number]     Weight:       70.0 lb Date of Birth:  07-28-1945      BSA:          1.195 m Patient Age:    84 years       BP:           116/81 mmHg Patient Gender: F              HR:           90 bpm. Exam Location:  Inpatient Procedure: 2D Echo, Color Doppler and Cardiac Doppler Indications:    R06.9 DOE  History:        Patient has no prior history of Echocardiogram examinations.                 COPD; Risk Factors:Lung Cancer.  Sonographer:    Raquel Sarna Senior RDCS Referring Phys: [id number] YATES  Sonographer Comments: Difficult windows due to very small body habitus IMPRESSIONS  1. Left ventricular ejection fraction, by estimation, is 55 to 60%. The left ventricle has normal function. The left ventricle has no regional wall motion abnormalities. Left ventricular diastolic parameters are consistent with Grade II diastolic dysfunction (pseudonormalization). Elevated left atrial pressure.  2. Right ventricular systolic function is mildly reduced. The right ventricular size is mildly enlarged. There is moderately elevated pulmonary artery systolic pressure. The estimated right ventricular systolic pressure is 93.8 mmHg.  3. The mitral valve is abnormal. Moderate to severe mitral valve regurgitation. No evidence of mitral stenosis. Posterior directed MR jet. Normal LA/LV size, low E wave velocity suggests likely moderate MR  4. Tricuspid valve regurgitation is mild to moderate.  5. The aortic valve is tricuspid. Aortic valve regurgitation is trivial. No aortic stenosis is present.  6. The inferior vena cava is dilated in size with <50% respiratory  variability, suggesting right atrial pressure of 15 mmHg. FINDINGS  Left Ventricle: Left ventricular ejection fraction, by estimation, is 55 to 60%. The left ventricle has normal function. The left ventricle has no regional wall motion abnormalities. The left ventricular internal cavity size was small. There is no left ventricular hypertrophy. Left ventricular diastolic parameters are consistent with Grade II diastolic dysfunction (pseudonormalization). Elevated left atrial pressure. Right Ventricle: The right ventricular size is mildly enlarged. Right vetricular wall thickness was not well visualized. Right ventricular systolic function is mildly reduced. There is moderately elevated pulmonary artery systolic pressure. The tricuspid  regurgitant velocity is 3.13 m/s, and with an assumed right atrial pressure of 15 mmHg, the estimated right ventricular systolic pressure is 18.2 mmHg. Left Atrium: Left atrial size was normal in size. Right Atrium: Right atrial size was normal in size. Pericardium: There is no evidence of pericardial effusion. Mitral Valve: The mitral valve is abnormal. Moderate to severe mitral valve regurgitation. No evidence of mitral valve stenosis. Tricuspid Valve: The tricuspid valve is normal in structure. Tricuspid valve regurgitation is mild to moderate. Aortic Valve: The aortic valve is tricuspid. Aortic valve regurgitation is trivial. No aortic stenosis is present. Pulmonic Valve: The pulmonic valve was not well visualized. Pulmonic valve regurgitation is not visualized. Aorta: The aortic root is normal in size and structure. Venous: The inferior vena cava is dilated in size with less than 50% respiratory variability, suggesting right atrial pressure of 15 mmHg. IAS/Shunts: The interatrial septum was not well visualized.  LEFT VENTRICLE PLAX 2D LVIDd:  3.10 cm   Diastology LVIDs:         2.10 cm   LV e' medial:    4.79 cm/s LV PW:         0.80 cm   LV E/e' medial:  19.5 LV IVS:         0.60 cm   LV e' lateral:   5.22 cm/s LVOT diam:     1.70 cm   LV E/e' lateral: 17.9 LV SV:         22 LV SV Index:   18 LVOT Area:     2.27 cm  RIGHT VENTRICLE RV S prime:     9.57 cm/s TAPSE (M-mode): 1.3 cm LEFT ATRIUM             Index        RIGHT ATRIUM          Index LA diam:        2.90 cm 2.43 cm/m   RA Area:     9.69 cm LA Vol (A2C):   30.3 ml 25.36 ml/m  RA Volume:   21.40 ml 17.91 ml/m LA Vol (A4C):   29.7 ml 24.86 ml/m LA Biplane Vol: 30.4 ml 25.44 ml/m  AORTIC VALVE LVOT Vmax:   63.80 cm/s LVOT Vmean:  47.400 cm/s LVOT VTI:    0.096 m  AORTA Ao Root diam: 3.00 cm MITRAL VALVE                  TRICUSPID VALVE MV Area (PHT): 4.93 cm       TR Peak grad:   39.2 mmHg MV Decel Time: 154 msec       TR Vmax:        313.00 cm/s MR Peak grad:    72.2 mmHg MR Mean grad:    50.0 mmHg    SHUNTS MR Vmax:         425.00 cm/s  Systemic VTI:  0.10 m MR Vmean:        329.0 cm/s   Systemic Diam: 1.70 cm MR PISA:         1.90 cm MR PISA Eff ROA: 16 mm MR PISA Radius:  0.55 cm MV E velocity: 93.60 cm/s MV A velocity: 79.60 cm/s MV E/A ratio:  1.18 Oswaldo Milian MD Electronically signed by Oswaldo Milian MD Signature Date/Time: 06/21/2021/3:11:35 PM    Final    CT Angio Chest PE W and/or Wo Contrast  Result Date: 06/21/2021 CLINICAL DATA:  76 year old female status post syncope and fall. History of lung cancer, left lower lobe radiation fibrosis, emphysema. EXAM: CT ANGIOGRAPHY CHEST WITH CONTRAST TECHNIQUE: Multidetector CT imaging of the chest was performed using the standard protocol during bolus administration of intravenous contrast. Multiplanar CT image reconstructions and MIPs were obtained to evaluate the vascular anatomy. RADIATION DOSE REDUCTION: This exam was performed according to the departmental dose-optimization program which includes automated exposure control, adjustment of the mA and/or kV according to patient size and/or use of iterative reconstruction technique. CONTRAST:  41mL  OMNIPAQUE IOHEXOL 350 MG/ML SOLN COMPARISON:  Restaging chest CT 05/05/2020. FINDINGS: Cardiovascular: Excellent contrast bolus timing in the pulmonary arterial tree. No focal filling defect identified in the pulmonary arteries to suggest acute pulmonary embolism. Calcified aortic atherosclerosis. Calcified coronary artery atherosclerosis. No cardiomegaly. No definite pericardial effusion. However, there is a small volume of contrast reflux into the hepatic IVC and hepatic veins. Mediastinum/Nodes: No definite mediastinal mass or lymphadenopathy. Lungs/Pleura: New moderate-sized right pleural  effusion, mostly layering and with simple fluid density. Chronic but increased small left side pleural effusion, chronically somewhat loculated at the lung base (series 5, image 88). Major airways remain patent but there is progressive consolidation of the left lower lobe from last year. Underlying centrilobular emphysema. Mild diffuse increased pulmonary interstitial density in both lungs. Mild compressive atelectasis also on the right. Upper Abdomen: Negative visible centrally noncontrast liver, spleen, pancreas, adrenal glands, kidneys, stomach in the upper abdomen. Musculoskeletal: Osteopenia. Chronic T9 through T12 compression fractures, some previously augmented. No acute or suspicious osseous lesion identified. Review of the MIP images confirms the above findings. IMPRESSION: 1. Negative for acute pulmonary embolus. 2. Progressed left lower lobe consolidation since last year, superimposed on post treatment pulmonary fibrosis. This might be atelectasis (see #3), but Pneumonia is not excluded. 3. Constellation suspicious for a degree of pulmonary edema and right heart failure superimposed on chronic lung disease with Emphysema (ICD10-J43.9), including new moderate size right pleural effusion and increased partially loculated left pleural effusion. 4. Calcified coronary artery and Aortic Atherosclerosis (ICD10-I70.0).  Electronically Signed   By: Genevie Ann M.D.   On: 06/21/2021 06:22   CT Cervical Spine Wo Contrast  Result Date: 06/21/2021 CLINICAL DATA:  Neck trauma (Age >= 65y).  Syncope, fall EXAM: CT CERVICAL SPINE WITHOUT CONTRAST TECHNIQUE: Multidetector CT imaging of the cervical spine was performed without intravenous contrast. Multiplanar CT image reconstructions were also generated. RADIATION DOSE REDUCTION: This exam was performed according to the departmental dose-optimization program which includes automated exposure control, adjustment of the mA and/or kV according to patient size and/or use of iterative reconstruction technique. COMPARISON:  None Available. FINDINGS: Alignment: No subluxation Skull base and vertebrae: No acute fracture. No primary bone lesion or focal pathologic process. Soft tissues and spinal canal: No prevertebral fluid or swelling. No visible canal hematoma. Disc levels: Diffuse degenerative disc disease with disc space narrowing and early spurring. Upper chest: Centrilobular emphysema. Bilateral pleural effusions partially imaged. Other: None IMPRESSION: No acute bony abnormality. Layering bilateral pleural effusions.  Emphysema. Electronically Signed   By: Rolm Baptise M.D.   On: 06/21/2021 00:14   CT Head Wo Contrast  Result Date: 06/21/2021 CLINICAL DATA:  Head trauma, minor (Age >= 65y).  Syncope, fall EXAM: CT HEAD WITHOUT CONTRAST TECHNIQUE: Contiguous axial images were obtained from the base of the skull through the vertex without intravenous contrast. RADIATION DOSE REDUCTION: This exam was performed according to the departmental dose-optimization program which includes automated exposure control, adjustment of the mA and/or kV according to patient size and/or use of iterative reconstruction technique. COMPARISON:  None Available. FINDINGS: Brain: No acute intracranial abnormality. Specifically, no hemorrhage, hydrocephalus, mass lesion, acute infarction, or significant  intracranial injury. Vascular: No hyperdense vessel or unexpected calcification. Skull: No acute calvarial abnormality. Sinuses/Orbits: No acute findings Other: None IMPRESSION: No acute intracranial abnormality. Electronically Signed   By: Rolm Baptise M.D.   On: 06/21/2021 00:08   DG Chest 2 View  Result Date: 06/20/2021 CLINICAL DATA:  Fall and syncope EXAM: CHEST - 2 VIEW COMPARISON:  01/25/2018, CT chest 05/05/2020 FINDINGS: Advanced emphysema. Small bilateral pleural effusions which appear slightly loculated. Distortion in the left infrahilar lung corresponding to post therapy changes on recent CT. New left suprahilar somewhat nodular opacity. Stable cardiomediastinal silhouette. Vascular congestion and diffuse interstitial opacity which could be due to mild edema. IMPRESSION: 1. Increased small bilateral pleural effusion which appears slightly loculated. Diffuse increased interstitial opacity which may be due to mild  edema 2. Emphysema with similar left infrahilar distortion and consolidation. New somewhat nodular opacity in the left suprahilar lung which could be due to a focus of pneumonia or possibly a lung nodule. Correlation with CT is suggested. Electronically Signed   By: Donavan Foil M.D.   On: 06/20/2021 21:46       Discharge Exam: Vitals:   06/23/21 0727 06/23/21 0745  BP: 97/62   Pulse: 97   Resp: 20   Temp: 97.8 F (36.6 C)   SpO2: 96% 98%    General: Pt is alert, awake, not in acute distress Cardiovascular: RRR, S1/S2 +, no edema Respiratory: Diminished breath sounds bilaterally without wheezing or distress, no conversational dyspnea Abdominal: Soft, NT, ND, bowel sounds + Extremities: no edema, no cyanosis Psych: Normal mood and affect, stable judgement and insight     The results of significant diagnostics from this hospitalization (including imaging, microbiology, ancillary and laboratory) are listed below for reference.     Microbiology: Recent Results (from  the past 240 hour(s))  Respiratory (~20 pathogens) panel by PCR     Status: None   Collection Time: 06/21/21  1:14 PM   Specimen: Nasopharyngeal Swab; Respiratory  Result Value Ref Range Status   Adenovirus NOT DETECTED NOT DETECTED Final   Coronavirus 229E NOT DETECTED NOT DETECTED Final    Comment: (NOTE) The Coronavirus on the Respiratory Panel, DOES NOT test for the novel  Coronavirus (2019 nCoV)    Coronavirus HKU1 NOT DETECTED NOT DETECTED Final   Coronavirus NL63 NOT DETECTED NOT DETECTED Final   Coronavirus OC43 NOT DETECTED NOT DETECTED Final   Metapneumovirus NOT DETECTED NOT DETECTED Final   Rhinovirus / Enterovirus NOT DETECTED NOT DETECTED Final   Influenza A NOT DETECTED NOT DETECTED Final   Influenza B NOT DETECTED NOT DETECTED Final   Parainfluenza Virus 1 NOT DETECTED NOT DETECTED Final   Parainfluenza Virus 2 NOT DETECTED NOT DETECTED Final   Parainfluenza Virus 3 NOT DETECTED NOT DETECTED Final   Parainfluenza Virus 4 NOT DETECTED NOT DETECTED Final   Respiratory Syncytial Virus NOT DETECTED NOT DETECTED Final   Bordetella pertussis NOT DETECTED NOT DETECTED Final   Bordetella Parapertussis NOT DETECTED NOT DETECTED Final   Chlamydophila pneumoniae NOT DETECTED NOT DETECTED Final   Mycoplasma pneumoniae NOT DETECTED NOT DETECTED Final    Comment: Performed at [facility], [address], [place], [place] [address]  MRSA Next Gen by PCR, Nasal     Status: None   Collection Time: 06/21/21  3:30 PM   Specimen: Nasal Mucosa; Nasal Swab  Result Value Ref Range Status   MRSA by PCR Next Gen NOT DETECTED NOT DETECTED Final    Comment: (NOTE) The GeneXpert MRSA Assay (FDA approved for NASAL specimens only), is one component of a comprehensive MRSA colonization surveillance program. It is not intended to diagnose MRSA infection nor to guide or monitor treatment for MRSA infections. Test performance is not FDA approved in patients less than 57  years old. Performed at [facility], [place] [address], [place], [place] [address]      Labs: BNP (last 3 results) Recent Labs    06/21/21 1614  BNP 2,202.5*   Basic Metabolic Panel: Recent Labs  Lab 06/20/21 1707 06/20/21 2140 06/21/21 0756 06/22/21 0321 06/23/21 0934  NA 142 144 142 143 148*  K 3.9 3.8 3.9 4.2 3.6  CL 106  --   --  104 102  CO2 28  --   --  31 35*  GLUCOSE 129*  --   --  120* 138*  BUN 29*  --   --  32* 29*  CREATININE 1.23*  --   --  1.02* 1.08*  CALCIUM 8.2*  --   --  8.6* 9.0   Liver Function Tests: Recent Labs  Lab 06/20/21 1707  AST 27  ALT 13  ALKPHOS 59  BILITOT 0.5  PROT 5.6*  ALBUMIN 2.9*   No results for input(s): "LIPASE", "AMYLASE" in the last 168 hours. No results for input(s): "AMMONIA" in the last 168 hours. CBC: Recent Labs  Lab 06/20/21 1707 06/20/21 2140 06/21/21 0756 06/22/21 0321  WBC 5.6  --   --  5.7  NEUTROABS 4.4  --   --   --   HGB 12.2 12.9 12.6 11.1*  HCT 41.0 38.0 37.0 36.7  MCV 96.0  --   --  92.9  PLT 153  --   --  149*   Cardiac Enzymes: No results for input(s): "CKTOTAL", "CKMB", "CKMBINDEX", "TROPONINI" in the last 168 hours. BNP: Invalid input(s): "POCBNP" CBG: Recent Labs  Lab 06/20/21 2113  GLUCAP 117*   D-Dimer No results for input(s): "DDIMER" in the last 72 hours. Hgb A1c No results for input(s): "HGBA1C" in the last 72 hours. Lipid Profile No results for input(s): "CHOL", "HDL", "LDLCALC", "TRIG", "CHOLHDL", "LDLDIRECT" in the last 72 hours. Thyroid function studies No results for input(s): "TSH", "T4TOTAL", "T3FREE", "THYROIDAB" in the last 72 hours.  Invalid input(s): "FREET3" Anemia work up No results for input(s): "VITAMINB12", "FOLATE", "FERRITIN", "TIBC", "IRON", "RETICCTPCT" in the last 72 hours. Urinalysis    Component Value Date/Time   COLORURINE STRAW (A) 03/01/2018 1225   APPEARANCEUR CLEAR 03/01/2018 1225   LABSPEC 1.008 03/01/2018 1225   PHURINE 7.0  03/01/2018 1225   GLUCOSEU NEGATIVE 03/01/2018 1225   HGBUR NEGATIVE 03/01/2018 [place] 03/01/2018 1225   KETONESUR NEGATIVE 03/01/2018 1225   PROTEINUR NEGATIVE 03/01/2018 1225   NITRITE NEGATIVE 03/01/2018 1225   LEUKOCYTESUR NEGATIVE 03/01/2018 1225   Sepsis Labs Recent Labs  Lab 06/20/21 1707 06/22/21 0321  WBC 5.6 5.7   Microbiology Recent Results (from the past 240 hour(s))  Respiratory (~20 pathogens) panel by PCR     Status: None   Collection Time: 06/21/21  1:14 PM   Specimen: Nasopharyngeal Swab; Respiratory  Result Value Ref Range Status   Adenovirus NOT DETECTED NOT DETECTED Final   Coronavirus 229E NOT DETECTED NOT DETECTED Final    Comment: (NOTE) The Coronavirus on the Respiratory Panel, DOES NOT test for the novel  Coronavirus (2019 nCoV)    Coronavirus HKU1 NOT DETECTED NOT DETECTED Final   Coronavirus NL63 NOT DETECTED NOT DETECTED Final   Coronavirus OC43 NOT DETECTED NOT DETECTED Final   Metapneumovirus NOT DETECTED NOT DETECTED Final   Rhinovirus / Enterovirus NOT DETECTED NOT DETECTED Final   Influenza A NOT DETECTED NOT DETECTED Final   Influenza B NOT DETECTED NOT DETECTED Final   Parainfluenza Virus 1 NOT DETECTED NOT DETECTED Final   Parainfluenza Virus 2 NOT DETECTED NOT DETECTED Final   Parainfluenza Virus 3 NOT DETECTED NOT DETECTED Final   Parainfluenza Virus 4 NOT DETECTED NOT DETECTED Final   Respiratory Syncytial Virus NOT DETECTED NOT DETECTED Final   Bordetella pertussis NOT DETECTED NOT DETECTED Final   Bordetella Parapertussis NOT DETECTED NOT DETECTED Final   Chlamydophila pneumoniae NOT DETECTED NOT DETECTED Final   Mycoplasma pneumoniae NOT DETECTED NOT DETECTED Final  Comment: Performed at [facility], [place] [address] [place], [place] [address]  MRSA Next Gen by PCR, Nasal     Status: None   Collection Time: 06/21/21  3:30 PM   Specimen: Nasal Mucosa; Nasal Swab  Result Value Ref Range Status   MRSA  by PCR Next Gen NOT DETECTED NOT DETECTED Final    Comment: (NOTE) The GeneXpert MRSA Assay (FDA approved for NASAL specimens only), is one component of a comprehensive MRSA colonization surveillance program. It is not intended to diagnose MRSA infection nor to guide or monitor treatment for MRSA infections. Test performance is not FDA approved in patients less than 27 years old. Performed at [facility], [place] [address] [place],  [address]      Patient was seen and examined on the day of discharge and was found to be in stable condition. Time coordinating discharge: 35 minutes including assessment and coordination of care, as well as examination of the patient.   SIGNED:  Dessa Phi, DO [facility] 06/23/2021, 10:56 AM

## 2021-06-23 NOTE — Progress Notes (Signed)
Progress Note  Patient Name: Sherry [name] Date of Encounter: 06/23/2021  [facility] Cardiologist: Quay Burow, MD   Subjective   Feeling well this morning. No complaints. Hopeful to DC home today.  Inpatient Medications    Scheduled Meds:  feeding supplement  237 mL Oral BID BM   heparin injection (subcutaneous)  5,000 Units Subcutaneous Q8H   ipratropium-albuterol  3 mL Nebulization BID   nicotine  14 mg Transdermal Daily   [START ON 06/24/2021] predniSONE  50 mg Oral Q breakfast   sodium chloride flush  3 mL Intravenous Q12H   Continuous Infusions:  PRN Meds: acetaminophen **OR** acetaminophen, albuterol, HYDROcodone-acetaminophen, meclizine, ondansetron **OR** ondansetron (ZOFRAN) IV   Vital Signs    Vitals:   06/22/21 2343 06/23/21 0332 06/23/21 0727 06/23/21 0745  BP: 93/61 108/71 97/62   Pulse: 95 85 97   Resp: 20 20 20    Temp: 98 F (36.7 C) 97.7 F (36.5 C) 97.8 F (36.6 C)   TempSrc: Oral Oral Oral   SpO2: 93% 97% 96% 98%  Weight:      Height:        Intake/Output Summary (Last 24 hours) at 06/23/2021 0941 Last data filed at 06/23/2021 0700 Gross per 24 hour  Intake 710 ml  Output --  Net 710 ml      06/22/2021    4:08 AM 06/20/2021    8:45 PM 05/10/2020    2:54 PM  Last 3 Weights  Weight (lbs) 72 lb 1.5 oz 70 lb 70 lb 14.4 oz  Weight (kg) 32.7 kg 31.752 kg 32.16 kg      Telemetry    Sinus Rhythm - Personally Reviewed  ECG    No new tracing  Physical Exam   GEN: Thin, frail older female  Neck: No JVD Cardiac: RRR, + systolic murmur, no rubs, or gallops.  Respiratory: Clear to auscultation bilaterally. GI: Soft, nontender, non-distended  MS: No edema; No deformity. Neuro:  Nonfocal  Psych: Normal affect   Labs    High Sensitivity Troponin:   Recent Labs  Lab 06/20/21 1707 06/21/21 0015  TROPONINIHS 80* 93*     Chemistry Recent Labs  Lab 06/20/21 1707 06/20/21 2140 06/21/21 0756 06/22/21 0321  NA 142 144 142  143  K 3.9 3.8 3.9 4.2  CL 106  --   --  104  CO2 28  --   --  31  GLUCOSE 129*  --   --  120*  BUN 29*  --   --  32*  CREATININE 1.23*  --   --  1.02*  CALCIUM 8.2*  --   --  8.6*  PROT 5.6*  --   --   --   ALBUMIN 2.9*  --   --   --   AST 27  --   --   --   ALT 13  --   --   --   ALKPHOS 59  --   --   --   BILITOT 0.5  --   --   --   GFRNONAA 46*  --   --  57*  ANIONGAP 8  --   --  8    Lipids No results for input(s): "CHOL", "TRIG", "HDL", "LABVLDL", "LDLCALC", "CHOLHDL" in the last 168 hours.  Hematology Recent Labs  Lab 06/20/21 1707 06/20/21 2140 06/21/21 0756 06/22/21 0321  WBC 5.6  --   --  5.7  RBC 4.27  --   --  3.95  HGB 12.2 12.9 12.6 11.1*  HCT 41.0 38.0 37.0 36.7  MCV 96.0  --   --  92.9  MCH 28.6  --   --  28.1  MCHC 29.8*  --   --  30.2  RDW 14.4  --   --  14.4  PLT 153  --   --  149*   Thyroid No results for input(s): "TSH", "FREET4" in the last 168 hours.  BNP Recent Labs  Lab 06/21/21 1614  BNP 1,691.3*    DDimer No results for input(s): "DDIMER" in the last 168 hours.   Radiology    ECHOCARDIOGRAM COMPLETE  Result Date: 06/21/2021    ECHOCARDIOGRAM REPORT   Patient Name:   DANDRA SHAMBAUGH Date of Exam: 06/21/2021 Medical Rec #:  [id number]      Height:       60.0 in Accession #:    [id number]     Weight:       70.0 lb Date of Birth:  1945/01/28      BSA:          1.195 m Patient Age:    58 years       BP:           116/81 mmHg Patient Gender: F              HR:           90 bpm. Exam Location:  Inpatient Procedure: 2D Echo, Color Doppler and Cardiac Doppler Indications:    R06.9 DOE  History:        Patient has no prior history of Echocardiogram examinations.                 COPD; Risk Factors:Lung Cancer.  Sonographer:    Raquel Sarna Senior RDCS Referring Phys: [id number] JENNIFER YATES  Sonographer Comments: Difficult windows due to very small body habitus IMPRESSIONS  1. Left ventricular ejection fraction, by estimation, is 55 to 60%. The left ventricle has  normal function. The left ventricle has no regional wall motion abnormalities. Left ventricular diastolic parameters are consistent with Grade II diastolic dysfunction (pseudonormalization). Elevated left atrial pressure.  2. Right ventricular systolic function is mildly reduced. The right ventricular size is mildly enlarged. There is moderately elevated pulmonary artery systolic pressure. The estimated right ventricular systolic pressure is 03.4 mmHg.  3. The mitral valve is abnormal. Moderate to severe mitral valve regurgitation. No evidence of mitral stenosis. Posterior directed MR jet. Normal LA/LV size, low E wave velocity suggests likely moderate MR  4. Tricuspid valve regurgitation is mild to moderate.  5. The aortic valve is tricuspid. Aortic valve regurgitation is trivial. No aortic stenosis is present.  6. The inferior vena cava is dilated in size with <50% respiratory variability, suggesting right atrial pressure of 15 mmHg. FINDINGS  Left Ventricle: Left ventricular ejection fraction, by estimation, is 55 to 60%. The left ventricle has normal function. The left ventricle has no regional wall motion abnormalities. The left ventricular internal cavity size was small. There is no left ventricular hypertrophy. Left ventricular diastolic parameters are consistent with Grade II diastolic dysfunction (pseudonormalization). Elevated left atrial pressure. Right Ventricle: The right ventricular size is mildly enlarged. Right vetricular wall thickness was not well visualized. Right ventricular systolic function is mildly reduced. There is moderately elevated pulmonary artery systolic pressure. The tricuspid  regurgitant velocity is 3.13 m/s, and with an assumed right atrial pressure of 15 mmHg, the estimated right ventricular systolic pressure is  54.2 mmHg. Left Atrium: Left atrial size was normal in size. Right Atrium: Right atrial size was normal in size. Pericardium: There is no evidence of pericardial effusion.  Mitral Valve: The mitral valve is abnormal. Moderate to severe mitral valve regurgitation. No evidence of mitral valve stenosis. Tricuspid Valve: The tricuspid valve is normal in structure. Tricuspid valve regurgitation is mild to moderate. Aortic Valve: The aortic valve is tricuspid. Aortic valve regurgitation is trivial. No aortic stenosis is present. Pulmonic Valve: The pulmonic valve was not well visualized. Pulmonic valve regurgitation is not visualized. Aorta: The aortic root is normal in size and structure. Venous: The inferior vena cava is dilated in size with less than 50% respiratory variability, suggesting right atrial pressure of 15 mmHg. IAS/Shunts: The interatrial septum was not well visualized.  LEFT VENTRICLE PLAX 2D LVIDd:         3.10 cm   Diastology LVIDs:         2.10 cm   LV e' medial:    4.79 cm/s LV PW:         0.80 cm   LV E/e' medial:  19.5 LV IVS:        0.60 cm   LV e' lateral:   5.22 cm/s LVOT diam:     1.70 cm   LV E/e' lateral: 17.9 LV SV:         22 LV SV Index:   18 LVOT Area:     2.27 cm  RIGHT VENTRICLE RV S prime:     9.57 cm/s TAPSE (M-mode): 1.3 cm LEFT ATRIUM             Index        RIGHT ATRIUM          Index LA diam:        2.90 cm 2.43 cm/m   RA Area:     9.69 cm LA Vol (A2C):   30.3 ml 25.36 ml/m  RA Volume:   21.40 ml 17.91 ml/m LA Vol (A4C):   29.7 ml 24.86 ml/m LA Biplane Vol: 30.4 ml 25.44 ml/m  AORTIC VALVE LVOT Vmax:   63.80 cm/s LVOT Vmean:  47.400 cm/s LVOT VTI:    0.096 m  AORTA Ao Root diam: 3.00 cm MITRAL VALVE                  TRICUSPID VALVE MV Area (PHT): 4.93 cm       TR Peak grad:   39.2 mmHg MV Decel Time: 154 msec       TR Vmax:        313.00 cm/s MR Peak grad:    72.2 mmHg MR Mean grad:    50.0 mmHg    SHUNTS MR Vmax:         425.00 cm/s  Systemic VTI:  0.10 m MR Vmean:        329.0 cm/s   Systemic Diam: 1.70 cm MR PISA:         1.90 cm MR PISA Eff ROA: 16 mm MR PISA Radius:  0.55 cm MV E velocity: 93.60 cm/s MV A velocity: 79.60 cm/s MV E/A  ratio:  1.18 Oswaldo Milian MD Electronically signed by Oswaldo Milian MD Signature Date/Time: 06/21/2021/3:11:35 PM    Final     Cardiac Studies   Echo: 06/21/2021   IMPRESSIONS     1. Left ventricular ejection fraction, by estimation, is 55 to 60%. The  left ventricle has normal function. The left ventricle  has no regional  wall motion abnormalities. Left ventricular diastolic parameters are  consistent with Grade II diastolic  dysfunction (pseudonormalization). Elevated left atrial pressure.   2. Right ventricular systolic function is mildly reduced. The right  ventricular size is mildly enlarged. There is moderately elevated  pulmonary artery systolic pressure. The estimated right ventricular  systolic pressure is 00.9 mmHg.   3. The mitral valve is abnormal. Moderate to severe mitral valve  regurgitation. No evidence of mitral stenosis. Posterior directed MR jet.  Normal LA/LV size, low E wave velocity suggests likely moderate MR   4. Tricuspid valve regurgitation is mild to moderate.   5. The aortic valve is tricuspid. Aortic valve regurgitation is trivial.  No aortic stenosis is present.   6. The inferior vena cava is dilated in size with <50% respiratory  variability, suggesting right atrial pressure of 15 mmHg.   FINDINGS   Left Ventricle: Left ventricular ejection fraction, by estimation, is 55  to 60%. The left ventricle has normal function. The left ventricle has no  regional wall motion abnormalities. The left ventricular internal cavity  size was small. There is no left  ventricular hypertrophy. Left ventricular diastolic parameters are  consistent with Grade II diastolic dysfunction (pseudonormalization).  Elevated left atrial pressure.   Right Ventricle: The right ventricular size is mildly enlarged. Right  vetricular wall thickness was not well visualized. Right ventricular  systolic function is mildly reduced. There is moderately elevated  pulmonary  artery systolic pressure. The tricuspid   regurgitant velocity is 3.13 m/s, and with an assumed right atrial  pressure of 15 mmHg, the estimated right ventricular systolic pressure is  38.1 mmHg.   Left Atrium: Left atrial size was normal in size.   Right Atrium: Right atrial size was normal in size.   Pericardium: There is no evidence of pericardial effusion.   Mitral Valve: The mitral valve is abnormal. Moderate to severe mitral  valve regurgitation. No evidence of mitral valve stenosis.   Tricuspid Valve: The tricuspid valve is normal in structure. Tricuspid  valve regurgitation is mild to moderate.   Aortic Valve: The aortic valve is tricuspid. Aortic valve regurgitation is  trivial. No aortic stenosis is present.   Pulmonic Valve: The pulmonic valve was not well visualized. Pulmonic valve  regurgitation is not visualized.   Aorta: The aortic root is normal in size and structure.   Venous: The inferior vena cava is dilated in size with less than 50%  respiratory variability, suggesting right atrial pressure of 15 mmHg.   IAS/Shunts: The interatrial septum was not well visualized.   Patient Profile     76 y.o. female with a hx of COPD, nonsmall cell lung cancer (in remission), depression who is being seen 06/22/2021 for the evaluation of moderate to severe MR at the request of Dr. Maylene Roes.  Assessment & Plan    Syncope: Suspect vagal symptoms based off patient's presentation, also with history of vertigo. No concerning arrhythmias noted on telemetry during admission.  CT angio chest negative for PE. Echo showed normal LV function, with moderate to severe MR.  -- will arrange for outpatient monitor   Moderate to severe MR: Echo this admission noting mild to moderate MR, no mitral stenosis, low E wave velocity suggesting likely moderate MR.   -- would continue to surveillance monitoring   Non-small cell lung cancer: Diagnosed 08/2017.  Treated with chemo and radiation.   Currently in remission --Follows with oncology   Acute hypoxic respiratory failure secondary  to COPD HFpEF --Initially hypoxic on admission and continues to require O2.  BNP elevated 1691.  Chest with moderate size right pleural effusion and left-sided partially loculated effusion. -- given IV Lasix 40 mg x 1, reports UOP (no output in I&Os this morning)   Malnutrition: BMI 14  For questions or updates, please contact [place] Please consult [email or web address] for contact info under        Signed, Reino Bellis, NP  06/23/2021, 9:41 AM

## 2021-06-23 NOTE — Progress Notes (Signed)
SATURATION QUALIFICATIONS: Patient Saturations on Room Air at Rest = 90%  Patient Saturations on Room Air while Ambulating = 78%  Patient Saturations on 2 Liters of oxygen while Ambulating = 90%  Pt stature is tiny. If possible, oxygen concentrator may be more manageable.

## 2021-06-23 NOTE — Progress Notes (Unsigned)
Enrolled patient for a 14 day Zio XT  monitor to be mailed to patients home  °

## 2021-06-23 NOTE — TOC Transition Note (Addendum)
Transition of Care [facility]) - CM/SW Discharge Note   Patient Details  Name: Sherry [name] MRN: [id number] Date of Birth: Mar 07, 1945  [facility] CM/SW Contact:  Angelita Ingles, RN Phone [phone]  06/23/2021, 12:07 PM   Clinical Narrative:    Patient discharging home with need for home O2. O2 referral has been called to [place] with [facility]. O2 to be delivered to the room. Ambulatory referral for outpatient PT complete and avs updated. No other needs noted at this time.          Patient Goals and CMS Choice        Discharge Placement                       Discharge Plan and Services                DME Arranged: Oxygen DME Agency: [employer] Date DME Agency Contacted: 06/23/21 Time DME Agency Contacted: [id number] Representative spoke with at DME Agency: Lu[place]cia            Social Determinants of Health ([place]) Interventions     Readmission Risk Interventions     No data to display

## 2021-06-27 ENCOUNTER — Inpatient Hospital Stay: Payer: Medicare Other | Admitting: Pulmonary Disease

## 2021-06-28 ENCOUNTER — Telehealth: Payer: Self-pay | Admitting: Physician Assistant

## 2021-06-28 NOTE — Telephone Encounter (Signed)
Scheduled per 6/20 in basket, message has been left with pt

## 2021-06-29 ENCOUNTER — Inpatient Hospital Stay: Payer: Medicare Other | Admitting: Pulmonary Disease

## 2021-06-29 NOTE — Progress Notes (Deleted)
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  The patient presented with large superior segment left lower lobe mass with questionable left hilar adenopathy  PRIOR THERAPY: 1) Concurrent chemoradiation with weekly carboplatin for AUC of 2 and paclitaxel 45 mg/M2.  Status post 7 cycles.  Last dose was given October 29, 2017 with partial response 2) Consolidation treatment with immunotherapy with Imfinzi 10 mg/KG every 2 weeks.  Status post 25 cycles. Last dose 12/12/2018  CURRENT THERAPY: Observation  INTERVAL HISTORY: ANYE [name] 76 y.o. female returns to the clinic today for follow-up visit.  The patient was last seen in the clinic on 05/10/2020.  The patient was supposed to follow-up 6 months later but was lost to follow-up since that time.  Since last being seen, the patient ***where when she?  Regarding her history of lung cancer, the patient completed concurrent chemoradiation which was then followed by 1 year of immunotherapy which was completed in December 2020.  She had been on observation since that time with surveillance imaging every 6 months most recently.  She recently presented to the hospital after having a syncopal episode at [facility].  CTA of the chest was negative for PE but showed progress LLL consolidation since last year and superimposed on posttreatment pulmonary fibrosis.  There may be atelectasis but pneumonia cannot be excluded.  The constellation of symptoms was suspicious for a degree of pulmonary edema and right heart failure superimposed on chronic lung disease with emphysema.  She also had a new moderate size right pleural effusion and increased partially loculated left pleural effusion.  She was seen by pulmonology and cardiology while admitted to the  hospital.  Of note CT head was negative for any acute abnormality.   Otherwise the patient denies any fever?  Fever 2 weeks ago?  She denies any chills or night sweats.  She continues to be cachectic appearing.  She reports dyspnea on exertion smoking stress?  She sometimes has coughing spells secondary to allergies.  Wheezing COPD inhalers?  She denies any nausea, vomiting, diarrhea, or constipation.  Denies any headache or visual changes.  The patient recently had a restaging CT.  She is here today for evaluation to review her scan results    MEDICAL HISTORY: Past Medical History:  Diagnosis Date   Allergy    Anxiety    Arthritis    Cancer ([place])    Cavitating mass in left lower lung lobe    Complication of anesthesia    woke up during procedure   COPD (chronic obstructive pulmonary disease) ([place])    not on home O2   Depression    Family history of adverse reaction to anesthesia    " my mother was always hard to wake up"   Full dentures    Headache    Neuromuscular disorder ([place])    Pneumonia    Thoracic spine fracture ([place]) 11/01/2017   T9   Wears glasses     ALLERGIES:  is allergic to omnipaque [iohexol] and penicillins.  MEDICATIONS:  Current Outpatient Medications  Medication Sig Dispense Refill   acetaminophen (TYLENOL 8 HOUR) 650 MG CR tablet Take 1 tablet (650 mg total) by mouth every 8 (eight) hours as needed for pain or fever. 30 tablet 0   albuterol (VENTOLIN HFA) 108 (90  Base) MCG/ACT inhaler Inhale 2 puffs into the lungs every 6 (six) hours as needed for wheezing or shortness of breath. 8.5 g 1   Fluticasone-Umeclidin-Vilant (TRELEGY ELLIPTA) 100-62.5-25 MCG/ACT AEPB Inhale 1 Inhalation into the lungs daily. 60 each 1   HYDROcodone-acetaminophen (NORCO/VICODIN) 5-325 MG tablet Take 1 tablet by mouth every 6 (six) hours as needed for moderate pain.     meclizine (ANTIVERT) 12.5 MG tablet Take 1 tablet (12.5 mg total) by mouth 2 (two) times daily as needed for  dizziness (vertigo). 30 tablet 0   nicotine (NICODERM CQ - DOSED IN MG/24 HR) 7 mg/24hr patch Place 1 patch (7 mg total) onto the skin daily. 28 patch 0   predniSONE (DELTASONE) 10 MG tablet Take 4 tabs for 3 days, then 3 tabs for 3 days, then 2 tabs for 3 days, then 1 tab for 3 days, then 1/2 tab for 4 days. 32 tablet 0   No current facility-administered medications for this visit.    SURGICAL HISTORY:  Past Surgical History:  Procedure Laterality Date   ELBOW SURGERY     for  "Tennis Elbow"   hand surgery     IR KYPHO THORACIC WITH BONE BIOPSY  11/27/2017   IR KYPHO THORACIC WITH BONE BIOPSY  03/01/2018   IR KYPHO THORACIC WITH BONE BIOPSY  03/26/2018   IR RADIOLOGIST EVAL & MGMT  11/20/2017   IR RADIOLOGIST EVAL & MGMT  02/26/2018   IR RADIOLOGIST EVAL & MGMT  03/13/2018   MULTIPLE TOOTH EXTRACTIONS     TUBAL LIGATION     VIDEO BRONCHOSCOPY N/A 08/31/2017   Procedure: VIDEO BRONCHOSCOPY;  Surgeon: Melrose Nakayama, MD;  Location: [facility] OR;  Service: Thoracic;  Laterality: N/A;    REVIEW OF SYSTEMS:   Review of Systems  Constitutional: Negative for appetite change, chills, fatigue, fever and unexpected weight change.  HENT:   Negative for mouth sores, nosebleeds, sore throat and trouble swallowing.   Eyes: Negative for eye problems and icterus.  Respiratory: Negative for cough, hemoptysis, shortness of breath and wheezing.   Cardiovascular: Negative for chest pain and leg swelling.  Gastrointestinal: Negative for abdominal pain, constipation, diarrhea, nausea and vomiting.  Genitourinary: Negative for bladder incontinence, difficulty urinating, dysuria, frequency and hematuria.   Musculoskeletal: Negative for back pain, gait problem, neck pain and neck stiffness.  Skin: Negative for itching and rash.  Neurological: Negative for dizziness, extremity weakness, gait problem, headaches, light-headedness and seizures.  Hematological: Negative for adenopathy. Does not bruise/bleed  easily.  Psychiatric/Behavioral: Negative for confusion, depression and sleep disturbance. The patient is not nervous/anxious.     PHYSICAL EXAMINATION:  There were no vitals taken for this visit.  ECOG PERFORMANCE STATUS: {CHL ONC ECOG [id number]  Physical Exam  Constitutional: Oriented to person, place, and time and well-developed, well-nourished, and in no distress. No distress.  HENT:  Head: Normocephalic and atraumatic.  Mouth/Throat: Oropharynx is clear and moist. No oropharyngeal exudate.  Eyes: Conjunctivae are normal. Right eye exhibits no discharge. Left eye exhibits no discharge. No scleral icterus.  Neck: Normal range of motion. Neck supple.  Cardiovascular: Normal rate, regular rhythm, normal heart sounds and intact distal pulses.   Pulmonary/Chest: Effort normal and breath sounds normal. No respiratory distress. No wheezes. No rales.  Abdominal: Soft. Bowel sounds are normal. Exhibits no distension and no mass. There is no tenderness.  Musculoskeletal: Normal range of motion. Exhibits no edema.  Lymphadenopathy:    No cervical adenopathy.  Neurological: Alert and oriented to  person, place, and time. Exhibits normal muscle tone. Gait normal. Coordination normal.  Skin: Skin is warm and dry. No rash noted. Not diaphoretic. No erythema. No pallor.  Psychiatric: Mood, memory and judgment normal.  Vitals reviewed.  LABORATORY DATA: Lab Results  Component Value Date   WBC 5.7 06/22/2021   HGB 11.1 (L) 06/22/2021   HCT 36.7 06/22/2021   MCV 92.9 06/22/2021   PLT 149 (L) 06/22/2021      Chemistry      Component Value Date/Time   NA 148 (H) 06/23/2021 0934   K 3.6 06/23/2021 0934   CL 102 06/23/2021 0934   CO2 35 (H) 06/23/2021 0934   BUN 29 (H) 06/23/2021 0934   CREATININE 1.08 (H) 06/23/2021 0934   CREATININE 0.83 05/05/2020 1357      Component Value Date/Time   CALCIUM 9.0 06/23/2021 0934   ALKPHOS 59 06/20/2021 1707   AST 27 06/20/2021 1707   AST 14  (L) 05/05/2020 1357   ALT 13 06/20/2021 1707   ALT <6 05/05/2020 1357   BILITOT 0.5 06/20/2021 1707   BILITOT 0.4 05/05/2020 1357       RADIOGRAPHIC STUDIES:  ECHOCARDIOGRAM COMPLETE  Result Date: 06/21/2021    ECHOCARDIOGRAM REPORT   Patient Name:   Sherry [name] Date of Exam: 06/21/2021 Medical Rec #:  [id number]      Height:       60.0 in Accession #:    [id number]     Weight:       70.0 lb Date of Birth:  09/09/1945      BSA:          1.195 m Patient Age:    16 years       BP:           116/81 mmHg Patient Gender: F              HR:           90 bpm. Exam Location:  Inpatient Procedure: 2D Echo, Color Doppler and Cardiac Doppler Indications:    R06.9 DOE  History:        Patient has no prior history of Echocardiogram examinations.                 COPD; Risk Factors:Lung Cancer.  Sonographer:    Raquel Sarna Senior RDCS Referring Phys: [id number] JENNIFER YATES  Sonographer Comments: Difficult windows due to very small body habitus IMPRESSIONS  1. Left ventricular ejection fraction, by estimation, is 55 to 60%. The left ventricle has normal function. The left ventricle has no regional wall motion abnormalities. Left ventricular diastolic parameters are consistent with Grade II diastolic dysfunction (pseudonormalization). Elevated left atrial pressure.  2. Right ventricular systolic function is mildly reduced. The right ventricular size is mildly enlarged. There is moderately elevated pulmonary artery systolic pressure. The estimated right ventricular systolic pressure is 68.1 mmHg.  3. The mitral valve is abnormal. Moderate to severe mitral valve regurgitation. No evidence of mitral stenosis. Posterior directed MR jet. Normal LA/LV size, low E wave velocity suggests likely moderate MR  4. Tricuspid valve regurgitation is mild to moderate.  5. The aortic valve is tricuspid. Aortic valve regurgitation is trivial. No aortic stenosis is present.  6. The inferior vena cava is dilated in size with <50% respiratory  variability, suggesting right atrial pressure of 15 mmHg. FINDINGS  Left Ventricle: Left ventricular ejection fraction, by estimation, is 55 to 60%. The left ventricle has normal function. The left ventricle  has no regional wall motion abnormalities. The left ventricular internal cavity size was small. There is no left ventricular hypertrophy. Left ventricular diastolic parameters are consistent with Grade II diastolic dysfunction (pseudonormalization). Elevated left atrial pressure. Right Ventricle: The right ventricular size is mildly enlarged. Right vetricular wall thickness was not well visualized. Right ventricular systolic function is mildly reduced. There is moderately elevated pulmonary artery systolic pressure. The tricuspid  regurgitant velocity is 3.13 m/s, and with an assumed right atrial pressure of 15 mmHg, the estimated right ventricular systolic pressure is 69.4 mmHg. Left Atrium: Left atrial size was normal in size. Right Atrium: Right atrial size was normal in size. Pericardium: There is no evidence of pericardial effusion. Mitral Valve: The mitral valve is abnormal. Moderate to severe mitral valve regurgitation. No evidence of mitral valve stenosis. Tricuspid Valve: The tricuspid valve is normal in structure. Tricuspid valve regurgitation is mild to moderate. Aortic Valve: The aortic valve is tricuspid. Aortic valve regurgitation is trivial. No aortic stenosis is present. Pulmonic Valve: The pulmonic valve was not well visualized. Pulmonic valve regurgitation is not visualized. Aorta: The aortic root is normal in size and structure. Venous: The inferior vena cava is dilated in size with less than 50% respiratory variability, suggesting right atrial pressure of 15 mmHg. IAS/Shunts: The interatrial septum was not well visualized.  LEFT VENTRICLE PLAX 2D LVIDd:         3.10 cm   Diastology LVIDs:         2.10 cm   LV e' medial:    4.79 cm/s LV PW:         0.80 cm   LV E/e' medial:  19.5 LV IVS:         0.60 cm   LV e' lateral:   5.22 cm/s LVOT diam:     1.70 cm   LV E/e' lateral: 17.9 LV SV:         22 LV SV Index:   18 LVOT Area:     2.27 cm  RIGHT VENTRICLE RV S prime:     9.57 cm/s TAPSE (M-mode): 1.3 cm LEFT ATRIUM             Index        RIGHT ATRIUM          Index LA diam:        2.90 cm 2.43 cm/m   RA Area:     9.69 cm LA Vol (A2C):   30.3 ml 25.36 ml/m  RA Volume:   21.40 ml 17.91 ml/m LA Vol (A4C):   29.7 ml 24.86 ml/m LA Biplane Vol: 30.4 ml 25.44 ml/m  AORTIC VALVE LVOT Vmax:   63.80 cm/s LVOT Vmean:  47.400 cm/s LVOT VTI:    0.096 m  AORTA Ao Root diam: 3.00 cm MITRAL VALVE                  TRICUSPID VALVE MV Area (PHT): 4.93 cm       TR Peak grad:   39.2 mmHg MV Decel Time: 154 msec       TR Vmax:        313.00 cm/s MR Peak grad:    72.2 mmHg MR Mean grad:    50.0 mmHg    SHUNTS MR Vmax:         425.00 cm/s  Systemic VTI:  0.10 m MR Vmean:        329.0 cm/s   Systemic Diam: 1.70 cm MR PISA:  1.90 cm MR PISA Eff ROA: 16 mm MR PISA Radius:  0.55 cm MV E velocity: 93.60 cm/s MV A velocity: 79.60 cm/s MV E/A ratio:  1.18 Oswaldo Milian MD Electronically signed by Oswaldo Milian MD Signature Date/Time: 06/21/2021/3:11:35 PM    Final    CT Angio Chest PE W and/or Wo Contrast  Result Date: 06/21/2021 CLINICAL DATA:  76 year old female status post syncope and fall. History of lung cancer, left lower lobe radiation fibrosis, emphysema. EXAM: CT ANGIOGRAPHY CHEST WITH CONTRAST TECHNIQUE: Multidetector CT imaging of the chest was performed using the standard protocol during bolus administration of intravenous contrast. Multiplanar CT image reconstructions and MIPs were obtained to evaluate the vascular anatomy. RADIATION DOSE REDUCTION: This exam was performed according to the departmental dose-optimization program which includes automated exposure control, adjustment of the mA and/or kV according to patient size and/or use of iterative reconstruction technique. CONTRAST:  44mL  OMNIPAQUE IOHEXOL 350 MG/ML SOLN COMPARISON:  Restaging chest CT 05/05/2020. FINDINGS: Cardiovascular: Excellent contrast bolus timing in the pulmonary arterial tree. No focal filling defect identified in the pulmonary arteries to suggest acute pulmonary embolism. Calcified aortic atherosclerosis. Calcified coronary artery atherosclerosis. No cardiomegaly. No definite pericardial effusion. However, there is a small volume of contrast reflux into the hepatic IVC and hepatic veins. Mediastinum/Nodes: No definite mediastinal mass or lymphadenopathy. Lungs/Pleura: New moderate-sized right pleural effusion, mostly layering and with simple fluid density. Chronic but increased small left side pleural effusion, chronically somewhat loculated at the lung base (series 5, image 88). Major airways remain patent but there is progressive consolidation of the left lower lobe from last year. Underlying centrilobular emphysema. Mild diffuse increased pulmonary interstitial density in both lungs. Mild compressive atelectasis also on the right. Upper Abdomen: Negative visible centrally noncontrast liver, spleen, pancreas, adrenal glands, kidneys, stomach in the upper abdomen. Musculoskeletal: Osteopenia. Chronic T9 through T12 compression fractures, some previously augmented. No acute or suspicious osseous lesion identified. Review of the MIP images confirms the above findings. IMPRESSION: 1. Negative for acute pulmonary embolus. 2. Progressed left lower lobe consolidation since last year, superimposed on post treatment pulmonary fibrosis. This might be atelectasis (see #3), but Pneumonia is not excluded. 3. Constellation suspicious for a degree of pulmonary edema and right heart failure superimposed on chronic lung disease with Emphysema (ICD10-J43.9), including new moderate size right pleural effusion and increased partially loculated left pleural effusion. 4. Calcified coronary artery and Aortic Atherosclerosis (ICD10-I70.0).  Electronically Signed   By: Genevie Ann M.D.   On: 06/21/2021 06:22   CT Cervical Spine Wo Contrast  Result Date: 06/21/2021 CLINICAL DATA:  Neck trauma (Age >= 65y).  Syncope, fall EXAM: CT CERVICAL SPINE WITHOUT CONTRAST TECHNIQUE: Multidetector CT imaging of the cervical spine was performed without intravenous contrast. Multiplanar CT image reconstructions were also generated. RADIATION DOSE REDUCTION: This exam was performed according to the departmental dose-optimization program which includes automated exposure control, adjustment of the mA and/or kV according to patient size and/or use of iterative reconstruction technique. COMPARISON:  None Available. FINDINGS: Alignment: No subluxation Skull base and vertebrae: No acute fracture. No primary bone lesion or focal pathologic process. Soft tissues and spinal canal: No prevertebral fluid or swelling. No visible canal hematoma. Disc levels: Diffuse degenerative disc disease with disc space narrowing and early spurring. Upper chest: Centrilobular emphysema. Bilateral pleural effusions partially imaged. Other: None IMPRESSION: No acute bony abnormality. Layering bilateral pleural effusions.  Emphysema. Electronically Signed   By: Rolm Baptise M.D.   On: 06/21/2021 00:14  CT Head Wo Contrast  Result Date: 06/21/2021 CLINICAL DATA:  Head trauma, minor (Age >= 65y).  Syncope, fall EXAM: CT HEAD WITHOUT CONTRAST TECHNIQUE: Contiguous axial images were obtained from the base of the skull through the vertex without intravenous contrast. RADIATION DOSE REDUCTION: This exam was performed according to the departmental dose-optimization program which includes automated exposure control, adjustment of the mA and/or kV according to patient size and/or use of iterative reconstruction technique. COMPARISON:  None Available. FINDINGS: Brain: No acute intracranial abnormality. Specifically, no hemorrhage, hydrocephalus, mass lesion, acute infarction, or significant  intracranial injury. Vascular: No hyperdense vessel or unexpected calcification. Skull: No acute calvarial abnormality. Sinuses/Orbits: No acute findings Other: None IMPRESSION: No acute intracranial abnormality. Electronically Signed   By: Rolm Baptise M.D.   On: 06/21/2021 00:08   DG Chest 2 View  Result Date: 06/20/2021 CLINICAL DATA:  Fall and syncope EXAM: CHEST - 2 VIEW COMPARISON:  01/25/2018, CT chest 05/05/2020 FINDINGS: Advanced emphysema. Small bilateral pleural effusions which appear slightly loculated. Distortion in the left infrahilar lung corresponding to post therapy changes on recent CT. New left suprahilar somewhat nodular opacity. Stable cardiomediastinal silhouette. Vascular congestion and diffuse interstitial opacity which could be due to mild edema. IMPRESSION: 1. Increased small bilateral pleural effusion which appears slightly loculated. Diffuse increased interstitial opacity which may be due to mild edema 2. Emphysema with similar left infrahilar distortion and consolidation. New somewhat nodular opacity in the left suprahilar lung which could be due to a focus of pneumonia or possibly a lung nodule. Correlation with CT is suggested. Electronically Signed   By: Donavan Foil M.D.   On: 06/20/2021 21:46     ASSESSMENT/PLAN:  This is a very pleasant 76 year old Caucasian female with a history of stage IIIa non-small cell lung cancer, poorly differentiated squamous cell carcinoma.  She presented with a large superior segment of the left lower lobe mass with questionable left hilar adenopathy.  She was diagnosed in August 2019.    The patient underwent a course of concurrent chemoradiation with carboplatin for an AUC of 2 and paclitaxel 45 mg/m. Status post 7 cycles with a partial response.  The patient is status post 25 cycles of consolidation immunotherapy with Imfinzi 10 mg/kg IV every 2 weeks.  This was completed in December 2020.   The patient was recently hospitalized for  syncopal episode.  She is following outpatient with pulmonology and cardiology  We will admitted to the hospital she had a repeat CT scan.  Dr. Julien Nordmann personally independently reviewed the scan discussed results with the patient today.  The scan did not show any evidence of disease progression.  Dr. Julien Nordmann recommends that she continue on observation with a restaging CT scan the chest in 6 months.  Smoking.  Scan 13-hour prep***  The patient was advised to call immediately if she has any concerning symptoms in the interval. The patient voices understanding of current disease status and treatment options and is in agreement with the current care plan. All questions were answered. The patient knows to call the clinic with any problems, questions or concerns. We can certainly see the patient much sooner if necessary        No orders of the defined types were placed in this encounter.    I spent {CHL ONC TIME VISIT - WIOMB:[id number]} counseling the patient face to face. The total time spent in the appointment was {CHL ONC TIME VISIT - TXMIW:[id number]}.  Amyrah Pinkhasov L Lilliann Rossetti, PA-C 06/29/21

## 2021-07-04 ENCOUNTER — Telehealth: Payer: Self-pay

## 2021-07-04 ENCOUNTER — Inpatient Hospital Stay: Payer: Medicare Other | Admitting: Physician Assistant

## 2021-07-04 ENCOUNTER — Telehealth: Payer: Self-pay | Admitting: Internal Medicine

## 2021-07-05 ENCOUNTER — Emergency Department (HOSPITAL_COMMUNITY)
Admission: EM | Admit: 2021-07-05 | Discharge: 2021-07-05 | Disposition: A | Discharge status: Home / Self Care | Payer: Medicare Other | Attending: Emergency Medicine | Admitting: Emergency Medicine

## 2021-07-05 ENCOUNTER — Emergency Department (HOSPITAL_COMMUNITY): Payer: Medicare Other

## 2021-07-05 ENCOUNTER — Encounter (HOSPITAL_COMMUNITY): Payer: Self-pay | Admitting: Emergency Medicine

## 2021-07-05 DIAGNOSIS — J449 Chronic obstructive pulmonary disease, unspecified: Secondary | ICD-10-CM | POA: Diagnosis not present

## 2021-07-05 DIAGNOSIS — N189 Chronic kidney disease, unspecified: Secondary | ICD-10-CM | POA: Diagnosis not present

## 2021-07-05 DIAGNOSIS — Z85118 Personal history of other malignant neoplasm of bronchus and lung: Secondary | ICD-10-CM | POA: Diagnosis not present

## 2021-07-05 DIAGNOSIS — E876 Hypokalemia: Secondary | ICD-10-CM | POA: Diagnosis not present

## 2021-07-05 DIAGNOSIS — I509 Heart failure, unspecified: Secondary | ICD-10-CM | POA: Insufficient documentation

## 2021-07-05 DIAGNOSIS — F17200 Nicotine dependence, unspecified, uncomplicated: Secondary | ICD-10-CM | POA: Insufficient documentation

## 2021-07-05 DIAGNOSIS — R079 Chest pain, unspecified: Secondary | ICD-10-CM | POA: Diagnosis present

## 2021-07-05 DIAGNOSIS — J90 Pleural effusion, not elsewhere classified: Secondary | ICD-10-CM | POA: Insufficient documentation

## 2021-07-05 LAB — BASIC METABOLIC PANEL
Anion gap: 10 (ref 5–15)
BUN: 29 mg/dL — ABNORMAL HIGH (ref 8–23)
CO2: 32 mmol/L (ref 22–32)
Calcium: 8.6 mg/dL — ABNORMAL LOW (ref 8.9–10.3)
Chloride: 104 mmol/L (ref 98–111)
Creatinine, Ser: 1.02 mg/dL — ABNORMAL HIGH (ref 0.44–1.00)
GFR, Estimated: 57 mL/min — ABNORMAL LOW (ref >60)
Glucose, Bld: 92 mg/dL (ref 70–99)
Potassium: 2.8 mmol/L — ABNORMAL LOW (ref 3.5–5.1)
Sodium: 146 mmol/L — ABNORMAL HIGH (ref 135–145)

## 2021-07-05 LAB — CBC
HCT: 45.9 % (ref 36.0–46.0)
Hemoglobin: 13.5 g/dL (ref 12.0–15.0)
MCH: 27.5 pg (ref 26.0–34.0)
MCHC: 29.4 g/dL — ABNORMAL LOW (ref 30.0–36.0)
MCV: 93.5 fL (ref 80.0–100.0)
Platelets: 113 10*3/uL — ABNORMAL LOW (ref 150–400)
RBC: 4.91 MIL/uL (ref 3.87–5.11)
RDW: 15.8 % — ABNORMAL HIGH (ref 11.5–15.5)
WBC: 7.4 10*3/uL (ref 4.0–10.5)
nRBC: 0 % (ref 0.0–0.2)

## 2021-07-05 LAB — TROPONIN I (HIGH SENSITIVITY)
Troponin I (High Sensitivity): 101 ng/L (ref <18)
Troponin I (High Sensitivity): 107 ng/L (ref <18)

## 2021-07-05 LAB — MAGNESIUM: Magnesium: 2.3 mg/dL (ref 1.7–2.4)

## 2021-07-05 MED ORDER — POTASSIUM CHLORIDE CRYS ER 20 MEQ PO TBCR
40.0000 meq | EXTENDED_RELEASE_TABLET | Freq: Once | ORAL | Status: AC
Start: 2021-07-05 — End: 2021-07-05
  Administered 2021-07-05: 40 meq via ORAL
  Filled 2021-07-05: qty 2

## 2021-07-05 MED ORDER — POTASSIUM BICARBONATE 25 MEQ PO TBEF
25.0000 meq | EFFERVESCENT_TABLET | Freq: Every day | ORAL | 0 refills | Status: DC
Start: 2021-07-05 — End: 2021-07-19

## 2021-07-05 MED ORDER — POTASSIUM CHLORIDE 10 MEQ/100ML IV SOLN
10.0000 meq | INTRAVENOUS | Status: AC
  Administered 2021-07-05 (×2): 10 meq via INTRAVENOUS
  Filled 2021-07-05 (×2): qty 100

## 2021-07-05 NOTE — Progress Notes (Deleted)
[facility] OFFICE PROGRESS NOTE  [name], NP [place] [address]  DIAGNOSIS: Stage IIb/IIIa (T3, N0/N1, M0) non-small cell lung cancer poorly differentiated squamous cell carcinoma diagnosed in August 2019.  The patient presented with large superior segment left lower lobe mass with questionable left hilar adenopathy  PRIOR THERAPY: 1) Concurrent chemoradiation with weekly carboplatin for AUC of 2 and paclitaxel 45 mg/M2.  Status post 7 cycles.  Last dose was given October 29, 2017 with partial response 2) Consolidation treatment with immunotherapy with Imfinzi 10 mg/KG every 2 weeks.  Status post 25 cycles. Last dose 12/12/2018  CURRENT THERAPY: Observation  INTERVAL HISTORY: Sherry [name] 76 y.o. female returns to the clinic today for follow-up visit.  The patient was last seen in the clinic on 05/10/2020.  The patient was supposed to follow-up 6 months later but was lost to follow-up since that time.  Since last being seen, the patient ***where when she?  Regarding her history of lung cancer, the patient completed concurrent chemoradiation which was then followed by 1 year of immunotherapy which was completed in December 2020.  She had been on observation since that time with surveillance imaging every 6 months most recently.  She recently presented to the hospital after having a syncopal episode at [facility].  CTA of the chest was negative for PE but showed progress LLL consolidation since last year and superimposed on posttreatment pulmonary fibrosis.  There may be atelectasis but pneumonia cannot be excluded.  The constellation of symptoms was suspicious for a degree of pulmonary edema and right heart failure superimposed on chronic lung disease with emphysema.  She also had a new moderate size right pleural effusion and increased partially loculated left pleural effusion.  She was seen by pulmonology and cardiology while admitted to the  hospital.  Of note CT head was negative for any acute abnormality.   Otherwise the patient denies any fever?  Fever 2 weeks ago?  She denies any chills or night sweats.  She continues to be cachectic appearing.  She reports dyspnea on exertion smoking stress?  She sometimes has coughing spells secondary to allergies.  Wheezing COPD inhalers?  She denies any nausea, vomiting, diarrhea, or constipation.  Denies any headache or visual changes.  The patient recently had a restaging CT.  She is here today for evaluation to review her scan results     MEDICAL HISTORY: Past Medical History:  Diagnosis Date   Allergy    Anxiety    Arthritis    Cancer ([place])    Cavitating mass in left lower lung lobe    Complication of anesthesia    woke up during procedure   COPD (chronic obstructive pulmonary disease) ([place])    not on home O2   Depression    Family history of adverse reaction to anesthesia    " my mother was always hard to wake up"   Full dentures    Headache    Neuromuscular disorder ([place])    Pneumonia    Thoracic spine fracture ([place]) 11/01/2017   T9   Wears glasses     ALLERGIES:  is allergic to omnipaque [iohexol] and penicillins.  MEDICATIONS:  Current Outpatient Medications  Medication Sig Dispense Refill   acetaminophen (TYLENOL 8 HOUR) 650 MG CR tablet Take 1 tablet (650 mg total) by mouth every 8 (eight) hours as needed for pain or fever. 30 tablet 0   albuterol (VENTOLIN HFA) 108 (  90 Base) MCG/ACT inhaler Inhale 2 puffs into the lungs every 6 (six) hours as needed for wheezing or shortness of breath. 8.5 g 1   Fluticasone-Umeclidin-Vilant (TRELEGY ELLIPTA) 100-62.5-25 MCG/ACT AEPB Inhale 1 Inhalation into the lungs daily. 60 each 1   HYDROcodone-acetaminophen (NORCO/VICODIN) 5-325 MG tablet Take 1 tablet by mouth every 6 (six) hours as needed for moderate pain.     meclizine (ANTIVERT) 12.5 MG tablet Take 1 tablet (12.5 mg total) by mouth 2 (two) times daily as needed for  dizziness (vertigo). 30 tablet 0   nicotine (NICODERM CQ - DOSED IN MG/24 HR) 7 mg/24hr patch Place 1 patch (7 mg total) onto the skin daily. 28 patch 0   potassium bicarbonate (K-LYTE) 25 MEQ disintegrating tablet Take 1 tablet (25 mEq total) by mouth daily for 5 days. 5 tablet 0   predniSONE (DELTASONE) 10 MG tablet Take 4 tabs for 3 days, then 3 tabs for 3 days, then 2 tabs for 3 days, then 1 tab for 3 days, then 1/2 tab for 4 days. 32 tablet 0   No current facility-administered medications for this visit.    SURGICAL HISTORY:  Past Surgical History:  Procedure Laterality Date   ELBOW SURGERY     for  "Tennis Elbow"   hand surgery     IR KYPHO THORACIC WITH BONE BIOPSY  11/27/2017   IR KYPHO THORACIC WITH BONE BIOPSY  03/01/2018   IR KYPHO THORACIC WITH BONE BIOPSY  03/26/2018   IR RADIOLOGIST EVAL & MGMT  11/20/2017   IR RADIOLOGIST EVAL & MGMT  02/26/2018   IR RADIOLOGIST EVAL & MGMT  03/13/2018   MULTIPLE TOOTH EXTRACTIONS     TUBAL LIGATION     VIDEO BRONCHOSCOPY N/A 08/31/2017   Procedure: VIDEO BRONCHOSCOPY;  Surgeon: Melrose Nakayama, MD;  Location: [facility] OR;  Service: Thoracic;  Laterality: N/A;    REVIEW OF SYSTEMS:   Review of Systems  Constitutional: Negative for appetite change, chills, fatigue, fever and unexpected weight change.  HENT:   Negative for mouth sores, nosebleeds, sore throat and trouble swallowing.   Eyes: Negative for eye problems and icterus.  Respiratory: Negative for cough, hemoptysis, shortness of breath and wheezing.   Cardiovascular: Negative for chest pain and leg swelling.  Gastrointestinal: Negative for abdominal pain, constipation, diarrhea, nausea and vomiting.  Genitourinary: Negative for bladder incontinence, difficulty urinating, dysuria, frequency and hematuria.   Musculoskeletal: Negative for back pain, gait problem, neck pain and neck stiffness.  Skin: Negative for itching and rash.  Neurological: Negative for dizziness, extremity  weakness, gait problem, headaches, light-headedness and seizures.  Hematological: Negative for adenopathy. Does not bruise/bleed easily.  Psychiatric/Behavioral: Negative for confusion, depression and sleep disturbance. The patient is not nervous/anxious.     PHYSICAL EXAMINATION:  There were no vitals taken for this visit.  ECOG PERFORMANCE STATUS: {CHL ONC ECOG [id number]  Physical Exam  Constitutional: Oriented to person, place, and time and well-developed, well-nourished, and in no distress. No distress.  HENT:  Head: Normocephalic and atraumatic.  Mouth/Throat: Oropharynx is clear and moist. No oropharyngeal exudate.  Eyes: Conjunctivae are normal. Right eye exhibits no discharge. Left eye exhibits no discharge. No scleral icterus.  Neck: Normal range of motion. Neck supple.  Cardiovascular: Normal rate, regular rhythm, normal heart sounds and intact distal pulses.   Pulmonary/Chest: Effort normal and breath sounds normal. No respiratory distress. No wheezes. No rales.  Abdominal: Soft. Bowel sounds are normal. Exhibits no distension and no mass. There is  no tenderness.  Musculoskeletal: Normal range of motion. Exhibits no edema.  Lymphadenopathy:    No cervical adenopathy.  Neurological: Alert and oriented to person, place, and time. Exhibits normal muscle tone. Gait normal. Coordination normal.  Skin: Skin is warm and dry. No rash noted. Not diaphoretic. No erythema. No pallor.  Psychiatric: Mood, memory and judgment normal.  Vitals reviewed.  LABORATORY DATA: Lab Results  Component Value Date   WBC 7.4 07/05/2021   HGB 13.5 07/05/2021   HCT 45.9 07/05/2021   MCV 93.5 07/05/2021   PLT 113 (L) 07/05/2021      Chemistry      Component Value Date/Time   NA 146 (H) 07/05/2021 0852   K 2.8 (L) 07/05/2021 0852   CL 104 07/05/2021 0852   CO2 32 07/05/2021 0852   BUN 29 (H) 07/05/2021 0852   CREATININE 1.02 (H) 07/05/2021 0852   CREATININE 0.83 05/05/2020 1357       Component Value Date/Time   CALCIUM 8.6 (L) 07/05/2021 0852   ALKPHOS 59 06/20/2021 1707   AST 27 06/20/2021 1707   AST 14 (L) 05/05/2020 1357   ALT 13 06/20/2021 1707   ALT <6 05/05/2020 1357   BILITOT 0.5 06/20/2021 1707   BILITOT 0.4 05/05/2020 1357       RADIOGRAPHIC STUDIES:  DG Chest 2 View  Result Date: 07/05/2021 CLINICAL DATA:  Shortness of breath and chest pain. Lung cancer with radiation fibrosis and emphysema. EXAM: CHEST - 2 VIEW COMPARISON:  06/21/2021 chest CT and prior chest radiograph from 06/20/2021 FINDINGS: Essentially stable moderate bilateral pleural effusions, likely with loculated components. Mildly reduced aeration at the left lung base. Emphysema. Atherosclerotic calcification of the aortic arch. Bony demineralization. Stable vertebral augmentations at 3 consecutive mid to lower thoracic levels. IMPRESSION: 1. Stable moderate size loculated pleural effusions with associated passive atelectasis. 2. Mildly reduced aeration at the left lung base, possibly from progressive atelectasis, radiation pneumonitis, or pneumonia. 3. Aortic Atherosclerosis (ICD10-I70.0) and Emphysema (ICD10-J43.9). Electronically Signed   By: Van Clines M.D.   On: 07/05/2021 09:51   ECHOCARDIOGRAM COMPLETE  Result Date: 06/21/2021    ECHOCARDIOGRAM REPORT   Patient Name:   Sherry [name] Date of Exam: 06/21/2021 Medical Rec #:  [id number]      Height:       60.0 in Accession #:    [id number]     Weight:       70.0 lb Date of Birth:  03-26-1945      BSA:          1.195 m Patient Age:    82 years       BP:           116/81 mmHg Patient Gender: F              HR:           90 bpm. Exam Location:  Inpatient Procedure: 2D Echo, Color Doppler and Cardiac Doppler Indications:    R06.9 DOE  History:        Patient has no prior history of Echocardiogram examinations.                 COPD; Risk Factors:Lung Cancer.  Sonographer:    Raquel Sarna Senior RDCS Referring Phys: [id number] JENNIFER YATES  Sonographer  Comments: Difficult windows due to very small body habitus IMPRESSIONS  1. Left ventricular ejection fraction, by estimation, is 55 to 60%. The left ventricle has normal function. The left ventricle has no  regional wall motion abnormalities. Left ventricular diastolic parameters are consistent with Grade II diastolic dysfunction (pseudonormalization). Elevated left atrial pressure.  2. Right ventricular systolic function is mildly reduced. The right ventricular size is mildly enlarged. There is moderately elevated pulmonary artery systolic pressure. The estimated right ventricular systolic pressure is 25.8 mmHg.  3. The mitral valve is abnormal. Moderate to severe mitral valve regurgitation. No evidence of mitral stenosis. Posterior directed MR jet. Normal LA/LV size, low E wave velocity suggests likely moderate MR  4. Tricuspid valve regurgitation is mild to moderate.  5. The aortic valve is tricuspid. Aortic valve regurgitation is trivial. No aortic stenosis is present.  6. The inferior vena cava is dilated in size with <50% respiratory variability, suggesting right atrial pressure of 15 mmHg. FINDINGS  Left Ventricle: Left ventricular ejection fraction, by estimation, is 55 to 60%. The left ventricle has normal function. The left ventricle has no regional wall motion abnormalities. The left ventricular internal cavity size was small. There is no left ventricular hypertrophy. Left ventricular diastolic parameters are consistent with Grade II diastolic dysfunction (pseudonormalization). Elevated left atrial pressure. Right Ventricle: The right ventricular size is mildly enlarged. Right vetricular wall thickness was not well visualized. Right ventricular systolic function is mildly reduced. There is moderately elevated pulmonary artery systolic pressure. The tricuspid  regurgitant velocity is 3.13 m/s, and with an assumed right atrial pressure of 15 mmHg, the estimated right ventricular systolic pressure is 52.7  mmHg. Left Atrium: Left atrial size was normal in size. Right Atrium: Right atrial size was normal in size. Pericardium: There is no evidence of pericardial effusion. Mitral Valve: The mitral valve is abnormal. Moderate to severe mitral valve regurgitation. No evidence of mitral valve stenosis. Tricuspid Valve: The tricuspid valve is normal in structure. Tricuspid valve regurgitation is mild to moderate. Aortic Valve: The aortic valve is tricuspid. Aortic valve regurgitation is trivial. No aortic stenosis is present. Pulmonic Valve: The pulmonic valve was not well visualized. Pulmonic valve regurgitation is not visualized. Aorta: The aortic root is normal in size and structure. Venous: The inferior vena cava is dilated in size with less than 50% respiratory variability, suggesting right atrial pressure of 15 mmHg. IAS/Shunts: The interatrial septum was not well visualized.  LEFT VENTRICLE PLAX 2D LVIDd:         3.10 cm   Diastology LVIDs:         2.10 cm   LV e' medial:    4.79 cm/s LV PW:         0.80 cm   LV E/e' medial:  19.5 LV IVS:        0.60 cm   LV e' lateral:   5.22 cm/s LVOT diam:     1.70 cm   LV E/e' lateral: 17.9 LV SV:         22 LV SV Index:   18 LVOT Area:     2.27 cm  RIGHT VENTRICLE RV S prime:     9.57 cm/s TAPSE (M-mode): 1.3 cm LEFT ATRIUM             Index        RIGHT ATRIUM          Index LA diam:        2.90 cm 2.43 cm/m   RA Area:     9.69 cm LA Vol (A2C):   30.3 ml 25.36 ml/m  RA Volume:   21.40 ml 17.91 ml/m LA Vol (A4C):   29.7  ml 24.86 ml/m LA Biplane Vol: 30.4 ml 25.44 ml/m  AORTIC VALVE LVOT Vmax:   63.80 cm/s LVOT Vmean:  47.400 cm/s LVOT VTI:    0.096 m  AORTA Ao Root diam: 3.00 cm MITRAL VALVE                  TRICUSPID VALVE MV Area (PHT): 4.93 cm       TR Peak grad:   39.2 mmHg MV Decel Time: 154 msec       TR Vmax:        313.00 cm/s MR Peak grad:    72.2 mmHg MR Mean grad:    50.0 mmHg    SHUNTS MR Vmax:         425.00 cm/s  Systemic VTI:  0.10 m MR Vmean:        329.0  cm/s   Systemic Diam: 1.70 cm MR PISA:         1.90 cm MR PISA Eff ROA: 16 mm MR PISA Radius:  0.55 cm MV E velocity: 93.60 cm/s MV A velocity: 79.60 cm/s MV E/A ratio:  1.18 Oswaldo Milian MD Electronically signed by Oswaldo Milian MD Signature Date/Time: 06/21/2021/3:11:35 PM    Final    CT Angio Chest PE W and/or Wo Contrast  Result Date: 06/21/2021 CLINICAL DATA:  76 year old female status post syncope and fall. History of lung cancer, left lower lobe radiation fibrosis, emphysema. EXAM: CT ANGIOGRAPHY CHEST WITH CONTRAST TECHNIQUE: Multidetector CT imaging of the chest was performed using the standard protocol during bolus administration of intravenous contrast. Multiplanar CT image reconstructions and MIPs were obtained to evaluate the vascular anatomy. RADIATION DOSE REDUCTION: This exam was performed according to the departmental dose-optimization program which includes automated exposure control, adjustment of the mA and/or kV according to patient size and/or use of iterative reconstruction technique. CONTRAST:  60mL OMNIPAQUE IOHEXOL 350 MG/ML SOLN COMPARISON:  Restaging chest CT 05/05/2020. FINDINGS: Cardiovascular: Excellent contrast bolus timing in the pulmonary arterial tree. No focal filling defect identified in the pulmonary arteries to suggest acute pulmonary embolism. Calcified aortic atherosclerosis. Calcified coronary artery atherosclerosis. No cardiomegaly. No definite pericardial effusion. However, there is a small volume of contrast reflux into the hepatic IVC and hepatic veins. Mediastinum/Nodes: No definite mediastinal mass or lymphadenopathy. Lungs/Pleura: New moderate-sized right pleural effusion, mostly layering and with simple fluid density. Chronic but increased small left side pleural effusion, chronically somewhat loculated at the lung base (series 5, image 88). Major airways remain patent but there is progressive consolidation of the left lower lobe from last year.  Underlying centrilobular emphysema. Mild diffuse increased pulmonary interstitial density in both lungs. Mild compressive atelectasis also on the right. Upper Abdomen: Negative visible centrally noncontrast liver, spleen, pancreas, adrenal glands, kidneys, stomach in the upper abdomen. Musculoskeletal: Osteopenia. Chronic T9 through T12 compression fractures, some previously augmented. No acute or suspicious osseous lesion identified. Review of the MIP images confirms the above findings. IMPRESSION: 1. Negative for acute pulmonary embolus. 2. Progressed left lower lobe consolidation since last year, superimposed on post treatment pulmonary fibrosis. This might be atelectasis (see #3), but Pneumonia is not excluded. 3. Constellation suspicious for a degree of pulmonary edema and right heart failure superimposed on chronic lung disease with Emphysema (ICD10-J43.9), including new moderate size right pleural effusion and increased partially loculated left pleural effusion. 4. Calcified coronary artery and Aortic Atherosclerosis (ICD10-I70.0). Electronically Signed   By: Genevie Ann M.D.   On: 06/21/2021 06:22   CT Cervical  Spine Wo Contrast  Result Date: 06/21/2021 CLINICAL DATA:  Neck trauma (Age >= 65y).  Syncope, fall EXAM: CT CERVICAL SPINE WITHOUT CONTRAST TECHNIQUE: Multidetector CT imaging of the cervical spine was performed without intravenous contrast. Multiplanar CT image reconstructions were also generated. RADIATION DOSE REDUCTION: This exam was performed according to the departmental dose-optimization program which includes automated exposure control, adjustment of the mA and/or kV according to patient size and/or use of iterative reconstruction technique. COMPARISON:  None Available. FINDINGS: Alignment: No subluxation Skull base and vertebrae: No acute fracture. No primary bone lesion or focal pathologic process. Soft tissues and spinal canal: No prevertebral fluid or swelling. No visible canal hematoma.  Disc levels: Diffuse degenerative disc disease with disc space narrowing and early spurring. Upper chest: Centrilobular emphysema. Bilateral pleural effusions partially imaged. Other: None IMPRESSION: No acute bony abnormality. Layering bilateral pleural effusions.  Emphysema. Electronically Signed   By: Rolm Baptise M.D.   On: 06/21/2021 00:14   CT Head Wo Contrast  Result Date: 06/21/2021 CLINICAL DATA:  Head trauma, minor (Age >= 65y).  Syncope, fall EXAM: CT HEAD WITHOUT CONTRAST TECHNIQUE: Contiguous axial images were obtained from the base of the skull through the vertex without intravenous contrast. RADIATION DOSE REDUCTION: This exam was performed according to the departmental dose-optimization program which includes automated exposure control, adjustment of the mA and/or kV according to patient size and/or use of iterative reconstruction technique. COMPARISON:  None Available. FINDINGS: Brain: No acute intracranial abnormality. Specifically, no hemorrhage, hydrocephalus, mass lesion, acute infarction, or significant intracranial injury. Vascular: No hyperdense vessel or unexpected calcification. Skull: No acute calvarial abnormality. Sinuses/Orbits: No acute findings Other: None IMPRESSION: No acute intracranial abnormality. Electronically Signed   By: Rolm Baptise M.D.   On: 06/21/2021 00:08   DG Chest 2 View  Result Date: 06/20/2021 CLINICAL DATA:  Fall and syncope EXAM: CHEST - 2 VIEW COMPARISON:  01/25/2018, CT chest 05/05/2020 FINDINGS: Advanced emphysema. Small bilateral pleural effusions which appear slightly loculated. Distortion in the left infrahilar lung corresponding to post therapy changes on recent CT. New left suprahilar somewhat nodular opacity. Stable cardiomediastinal silhouette. Vascular congestion and diffuse interstitial opacity which could be due to mild edema. IMPRESSION: 1. Increased small bilateral pleural effusion which appears slightly loculated. Diffuse increased  interstitial opacity which may be due to mild edema 2. Emphysema with similar left infrahilar distortion and consolidation. New somewhat nodular opacity in the left suprahilar lung which could be due to a focus of pneumonia or possibly a lung nodule. Correlation with CT is suggested. Electronically Signed   By: Donavan Foil M.D.   On: 06/20/2021 21:46     ASSESSMENT/PLAN:  This is a very pleasant 76 year old Caucasian female with a history of stage IIIa non-small cell lung cancer, poorly differentiated squamous cell carcinoma.  She presented with a large superior segment of the left lower lobe mass with questionable left hilar adenopathy.  She was diagnosed in August 2019.    The patient underwent a course of concurrent chemoradiation with carboplatin for an AUC of 2 and paclitaxel 45 mg/m. Status post 7 cycles with a partial response.  The patient is status post 25 cycles of consolidation immunotherapy with Imfinzi 10 mg/kg IV every 2 weeks.  This was completed in December 2020.   The patient was recently hospitalized for syncopal episode.  She is following outpatient with pulmonology and cardiology  We will admitted to the hospital she had a repeat CT scan.  Dr. Julien Nordmann personally independently reviewed  the scan discussed results with the patient today.  The scan did not show any evidence of disease progression.  Dr. Julien Nordmann recommends that she continue on observation with a restaging CT scan the chest in 6 months.  Smoking.  Scan 13-hour prep***  The patient was advised to call immediately if she has any concerning symptoms in the interval. The patient voices understanding of current disease status and treatment options and is in agreement with the current care plan. All questions were answered. The patient knows to call the clinic with any problems, questions or concerns. We can certainly see the patient much sooner if necessary   No orders of the defined types were placed in this  encounter.    I spent {CHL ONC TIME VISIT - DXIPJ:[id number]} counseling the patient face to face. The total time spent in the appointment was {CHL ONC TIME VISIT - HALPF:[id number]}.  Fletcher Ostermiller L Fread Kottke, PA-C 07/05/21

## 2021-07-05 NOTE — ED Notes (Signed)
Patient A&Ox4, sitting up in bed and requesting hot food at this time. Patient requests to stay in the hospital over night due to being nervous about going home. Patient reports that she took a breathing treatment this morning then felt dizzy and for that reason does not feel safe going home. ED Provider made aware of patient concerns.

## 2021-07-05 NOTE — ED Provider Notes (Signed)
  Face-to-face evaluation   History: Patient here for evaluation of chest discomfort.  She presents by EMS.  Patient reports fluttering heart, for 2 hours this morning that has since resolved.  She has also had chest pain on and off for 2 days.  Recently hospitalized for evaluation and treatment of syncope.  She has not seen cardiology as an outpatient.  Physical exam: Frail, elderly, no acute distress.  She is hard of hearing.  No dysarthria or aphasia.  Heart rate normal during exam.  No respiratory distress.  MDM: Evaluation for  Chief Complaint  Patient presents with   Chest Pain     Elderly female with history of syncope, presenting with palpitations as a "fluttering."  She has not had extensive cardiac work-up.  She recently had a cardiac echo.  Today in the ED her potassium was low.  She does not take diuretics.  She appears generally decompensated, but not acutely ill.  Medical screening examination/treatment/procedure(s) were conducted as a shared visit with non-physician practitioner(s) and myself.  I personally evaluated the patient during the encounter    Mancel Bale, MD 07/05/21 1946

## 2021-07-11 ENCOUNTER — Inpatient Hospital Stay: Payer: Medicare Other | Attending: Physician Assistant | Admitting: Physician Assistant

## 2021-07-11 ENCOUNTER — Telehealth: Payer: Self-pay

## 2021-07-11 DIAGNOSIS — R42 Dizziness and giddiness: Secondary | ICD-10-CM | POA: Insufficient documentation

## 2021-07-11 DIAGNOSIS — R5383 Other fatigue: Secondary | ICD-10-CM | POA: Insufficient documentation

## 2021-07-11 DIAGNOSIS — J811 Chronic pulmonary edema: Secondary | ICD-10-CM | POA: Insufficient documentation

## 2021-07-11 DIAGNOSIS — M7989 Other specified soft tissue disorders: Secondary | ICD-10-CM | POA: Insufficient documentation

## 2021-07-11 DIAGNOSIS — J8410 Pulmonary fibrosis, unspecified: Secondary | ICD-10-CM | POA: Insufficient documentation

## 2021-07-11 DIAGNOSIS — Z9221 Personal history of antineoplastic chemotherapy: Secondary | ICD-10-CM | POA: Insufficient documentation

## 2021-07-11 DIAGNOSIS — I50810 Right heart failure, unspecified: Secondary | ICD-10-CM | POA: Insufficient documentation

## 2021-07-11 DIAGNOSIS — Z923 Personal history of irradiation: Secondary | ICD-10-CM | POA: Insufficient documentation

## 2021-07-11 DIAGNOSIS — M8580 Other specified disorders of bone density and structure, unspecified site: Secondary | ICD-10-CM | POA: Insufficient documentation

## 2021-07-11 DIAGNOSIS — C3432 Malignant neoplasm of lower lobe, left bronchus or lung: Secondary | ICD-10-CM | POA: Insufficient documentation

## 2021-07-11 DIAGNOSIS — J90 Pleural effusion, not elsewhere classified: Secondary | ICD-10-CM | POA: Insufficient documentation

## 2021-07-11 DIAGNOSIS — J449 Chronic obstructive pulmonary disease, unspecified: Secondary | ICD-10-CM | POA: Insufficient documentation

## 2021-07-11 DIAGNOSIS — R630 Anorexia: Secondary | ICD-10-CM | POA: Insufficient documentation

## 2021-07-11 NOTE — Telephone Encounter (Signed)
Left patient a voicemail regarding missed appointment today and that scheduling will be reaching out to reschedule her appointment for sometime next week. Left callback # [phone] if she has any questions or concerns.

## 2021-07-14 NOTE — Progress Notes (Signed)
[facility] OFFICE PROGRESS NOTE  [name], NP [place] [id number]  DIAGNOSIS: Stage IIb/IIIa (T3, N0/N1, M0) non-small cell lung cancer poorly differentiated squamous cell carcinoma diagnosed in August 2019.  The patient presented with large superior segment left lower lobe mass with questionable left hilar adenopathy  PRIOR THERAPY: 1) Concurrent chemoradiation with weekly carboplatin for AUC of 2 and paclitaxel 45 mg/M2.  Status post 7 cycles.  Last dose was given October 29, 2017 with partial response 2) Consolidation treatment with immunotherapy with Imfinzi 10 mg/KG every 2 weeks.  Status post 25 cycles. Last dose 12/12/2018  CURRENT THERAPY: Observation  INTERVAL HISTORY: Sherry [name] 76 y.o. female returns to the clinic today for a follow-up visit accompanied by her brother.  The patient was last seen in the clinic on 05/10/2020.  The patient was supposed to follow-up 6 months later but was lost to follow-up since that time.   Regarding her history of lung cancer, the patient completed concurrent chemoradiation which was then followed by 1 year of immunotherapy which was completed in December 2020.  She had been on observation since that time with surveillance imaging every 6 months, except she was last seen in May 2022 due to missing numerous attempts to reschedule her missed appointments.   She recently presented to the hospital after having a syncopal episode at [facility].  CTA of the chest was negative for PE but showed progress LLL consolidation since last year and superimposed on posttreatment pulmonary fibrosis.  There may be atelectasis but pneumonia cannot be excluded.  The constellation of symptoms was suspicious for a degree of pulmonary edema and right heart failure superimposed on chronic lung disease with emphysema.  She also had a new moderate size right pleural effusion and increased partially loculated left pleural effusion.   She was seen by pulmonology and cardiology while admitted to the hospital.  Of note CT head was negative for any acute abnormality.   The patient was supposed to follow-up outpatient with pulmonology and cardiology.  The patient states that she did not go to her appointments due to not feeling up to it and wanting to rest.   When the patient presented to the clinic today, her oxygen was low at 83%.  She has supplemental oxygen at home but she has not been wearing it because it has been irritating her nostrils secondary to drying of the nasal mucosa.  She does not have a pulse oximeter at home.  It is unclear if the patient has portable oxygen.  Her oxygen saturation responded quickly to 2 L of oxygen improving to 97%.  Overall the patient has low energy, bilateral lower extremity swelling and associated dryness and sores on her lower legs.  She reports significant fatigue.  She does not have compression stockings.  She was prescribed Lasix in the hospital.  The patient denies any recent fevers or night sweats.  The patient is cachectic appearing.  She gained 2 pounds which may be secondary to fluid retention.  She states her breathing is "good" and she does not "necessarily" get dyspnea on exertion.  She reports when she stands up she gets dizzy.  She reports she is not coughing a lot.  She denies any chest pain or hemoptysis.  Denies any nausea, vomiting, diarrhea, or constipation.  She is here today to review the results of her CT scan that was performed in the hospital.   MEDICAL HISTORY: Past  Medical History:  Diagnosis Date   Allergy    Anxiety    Arthritis    Cancer (Sherry [name])    Cavitating mass in left lower lung lobe    Complication of anesthesia    woke up during procedure   COPD (chronic obstructive pulmonary disease) ([place])    not on home O2   Depression    Family history of adverse reaction to anesthesia    " my mother was always hard to wake up"   Full dentures    Headache     Neuromuscular disorder ([place])    Pneumonia    Thoracic spine fracture ([place]) 11/01/2017   T9   Wears glasses     ALLERGIES:  is allergic to omnipaque [iohexol] and penicillins.  MEDICATIONS:  Current Outpatient Medications  Medication Sig Dispense Refill   acetaminophen (TYLENOL 8 HOUR) 650 MG CR tablet Take 1 tablet (650 mg total) by mouth every 8 (eight) hours as needed for pain or fever. 30 tablet 0   albuterol (VENTOLIN HFA) 108 (90 Base) MCG/ACT inhaler Inhale 2 puffs into the lungs every 6 (six) hours as needed for wheezing or shortness of breath. 8.5 g 1   Fluticasone-Umeclidin-Vilant (TRELEGY ELLIPTA) 100-62.5-25 MCG/ACT AEPB Inhale 1 Inhalation into the lungs daily. 60 each 1   HYDROcodone-acetaminophen (NORCO/VICODIN) 5-325 MG tablet Take 1 tablet by mouth every 6 (six) hours as needed for moderate pain.     meclizine (ANTIVERT) 12.5 MG tablet Take 1 tablet (12.5 mg total) by mouth 2 (two) times daily as needed for dizziness (vertigo). 30 tablet 0   nicotine (NICODERM CQ - DOSED IN MG/24 HR) 7 mg/24hr patch Place 1 patch (7 mg total) onto the skin daily. (Patient not taking: Reported on 07/18/2021) 28 patch 0   potassium bicarbonate (K-LYTE) 25 MEQ disintegrating tablet Take 1 tablet (25 mEq total) by mouth daily for 5 days. 5 tablet 0   predniSONE (DELTASONE) 10 MG tablet Take 4 tabs for 3 days, then 3 tabs for 3 days, then 2 tabs for 3 days, then 1 tab for 3 days, then 1/2 tab for 4 days. (Patient not taking: Reported on 07/18/2021) 32 tablet 0   No current facility-administered medications for this visit.    SURGICAL HISTORY:  Past Surgical History:  Procedure Laterality Date   ELBOW SURGERY     for  "Tennis Elbow"   hand surgery     IR KYPHO THORACIC WITH BONE BIOPSY  11/27/2017   IR KYPHO THORACIC WITH BONE BIOPSY  03/01/2018   IR KYPHO THORACIC WITH BONE BIOPSY  03/26/2018   IR RADIOLOGIST EVAL & MGMT  11/20/2017   IR RADIOLOGIST EVAL & MGMT  02/26/2018   IR RADIOLOGIST  EVAL & MGMT  03/13/2018   MULTIPLE TOOTH EXTRACTIONS     TUBAL LIGATION     VIDEO BRONCHOSCOPY N/A 08/31/2017   Procedure: VIDEO BRONCHOSCOPY;  Surgeon: Melrose Nakayama, MD;  Location: [facility] OR;  Service: Thoracic;  Laterality: N/A;    REVIEW OF SYSTEMS:   Review of Systems  Constitutional: Positive for fatigue and low energy.  Positive for decreased appetite.  Negative for chills and fever.  HENT: Negative for mouth sores, nosebleeds, sore throat and trouble swallowing.   Eyes: Negative for eye problems and icterus.  Respiratory: Negative for cough, hemoptysis, shortness of breath and wheezing.   Cardiovascular: Negative for chest pain.  Positive for bilateral lower extremity swelling. Gastrointestinal: Negative for abdominal pain, constipation, diarrhea, nausea and vomiting.  Genitourinary:  Negative for bladder incontinence, difficulty urinating, dysuria, frequency and hematuria.   Musculoskeletal: Negative for back pain, gait problem, neck pain and neck stiffness.  Skin: Positive for sores in her lower extremity. Neurological: Positive for lightheaded with standing.  Negative for dizziness, extremity weakness, gait problem, headaches, and seizures.  Hematological: Negative for adenopathy. Does not bruise/bleed easily.  Psychiatric/Behavioral: Negative for confusion, depression and sleep disturbance. The patient is not nervous/anxious.     PHYSICAL EXAMINATION:  Blood pressure 118/77, pulse (!) 102, temperature 98.1 F (36.7 C), temperature source Temporal, resp. rate 16, height 5\' 1"  (1.549 m), weight 72 lb 9.6 oz (32.9 kg), SpO2 (!) 83 %.  Improved to 97% with 2 L of supplemental oxygen.  ECOG PERFORMANCE STATUS: 2-3  Physical Exam  Constitutional: Oriented to person, place, and time and cachectic appearing female, and in no distress.  HENT:  Head: Normocephalic and atraumatic.  Mouth/Throat: Oropharynx is clear and moist. No oropharyngeal exudate.  Eyes: Conjunctivae are  normal. Right eye exhibits no discharge. Left eye exhibits no discharge. No scleral icterus.  Neck: Normal range of motion. Neck supple.  Cardiovascular: Normal rate, regular rhythm, normal heart sounds and intact distal pulses.   Pulmonary/Chest: Effort normal.  Decreased breath sounds in the right and left lung left worse than right.  No respiratory distress. No wheezes. No rales.  Abdominal: Soft. Bowel sounds are normal. Exhibits no distension and no mass. There is no tenderness.  Musculoskeletal: Normal range of motion.  Positive for bilateral lower extremity edema in the feet and calves. Lymphadenopathy:    No cervical adenopathy.  Neurological: Alert and oriented to person, place, and time. Exhibits also wasting.  The patient was examined in the wheelchair.   Skin: Skin is warm and dry. No rash noted. Not diaphoretic. No erythema. No pallor.  Psychiatric: Mood, memory and judgment normal.  Vitals reviewed.  LABORATORY DATA: Lab Results  Component Value Date   WBC 7.4 07/05/2021   HGB 13.5 07/05/2021   HCT 45.9 07/05/2021   MCV 93.5 07/05/2021   PLT 113 (L) 07/05/2021      Chemistry      Component Value Date/Time   NA 146 (H) 07/05/2021 0852   K 2.8 (L) 07/05/2021 0852   CL 104 07/05/2021 0852   CO2 32 07/05/2021 0852   BUN 29 (H) 07/05/2021 0852   CREATININE 1.02 (H) 07/05/2021 0852   CREATININE 0.83 05/05/2020 1357      Component Value Date/Time   CALCIUM 8.6 (L) 07/05/2021 0852   ALKPHOS 59 06/20/2021 1707   AST 27 06/20/2021 1707   AST 14 (L) 05/05/2020 1357   ALT 13 06/20/2021 1707   ALT <6 05/05/2020 1357   BILITOT 0.5 06/20/2021 1707   BILITOT 0.4 05/05/2020 1357       RADIOGRAPHIC STUDIES:  DG Chest 2 View  Result Date: 07/05/2021 CLINICAL DATA:  Shortness of breath and chest pain. Lung cancer with radiation fibrosis and emphysema. EXAM: CHEST - 2 VIEW COMPARISON:  06/21/2021 chest CT and prior chest radiograph from 06/20/2021 FINDINGS: Essentially  stable moderate bilateral pleural effusions, likely with loculated components. Mildly reduced aeration at the left lung base. Emphysema. Atherosclerotic calcification of the aortic arch. Bony demineralization. Stable vertebral augmentations at 3 consecutive mid to lower thoracic levels. IMPRESSION: 1. Stable moderate size loculated pleural effusions with associated passive atelectasis. 2. Mildly reduced aeration at the left lung base, possibly from progressive atelectasis, radiation pneumonitis, or pneumonia. 3. Aortic Atherosclerosis (ICD10-I70.0) and Emphysema (ICD10-J43.9). Electronically  Signed   By: Van Clines M.D.   On: 07/05/2021 09:51   ECHOCARDIOGRAM COMPLETE  Result Date: 06/21/2021    ECHOCARDIOGRAM REPORT   Patient Name:   EYVA CALIFANO Date of Exam: 06/21/2021 Medical Rec #:  [id number]      Height:       60.0 in Accession #:    [id number]     Weight:       70.0 lb Date of Birth:  1945-06-29      BSA:          1.195 m Patient Age:    9 years       BP:           116/81 mmHg Patient Gender: F              HR:           90 bpm. Exam Location:  Inpatient Procedure: 2D Echo, Color Doppler and Cardiac Doppler Indications:    R06.9 DOE  History:        Patient has no prior history of Echocardiogram examinations.                 COPD; Risk Factors:Lung Cancer.  Sonographer:    Raquel Sarna Senior RDCS Referring Phys: [id number] JENNIFER YATES  Sonographer Comments: Difficult windows due to very small body habitus IMPRESSIONS  1. Left ventricular ejection fraction, by estimation, is 55 to 60%. The left ventricle has normal function. The left ventricle has no regional wall motion abnormalities. Left ventricular diastolic parameters are consistent with Grade II diastolic dysfunction (pseudonormalization). Elevated left atrial pressure.  2. Right ventricular systolic function is mildly reduced. The right ventricular size is mildly enlarged. There is moderately elevated pulmonary artery systolic pressure. The  estimated right ventricular systolic pressure is 44.0 mmHg.  3. The mitral valve is abnormal. Moderate to severe mitral valve regurgitation. No evidence of mitral stenosis. Posterior directed MR jet. Normal LA/LV size, low E wave velocity suggests likely moderate MR  4. Tricuspid valve regurgitation is mild to moderate.  5. The aortic valve is tricuspid. Aortic valve regurgitation is trivial. No aortic stenosis is present.  6. The inferior vena cava is dilated in size with <50% respiratory variability, suggesting right atrial pressure of 15 mmHg. FINDINGS  Left Ventricle: Left ventricular ejection fraction, by estimation, is 55 to 60%. The left ventricle has normal function. The left ventricle has no regional wall motion abnormalities. The left ventricular internal cavity size was small. There is no left ventricular hypertrophy. Left ventricular diastolic parameters are consistent with Grade II diastolic dysfunction (pseudonormalization). Elevated left atrial pressure. Right Ventricle: The right ventricular size is mildly enlarged. Right vetricular wall thickness was not well visualized. Right ventricular systolic function is mildly reduced. There is moderately elevated pulmonary artery systolic pressure. The tricuspid  regurgitant velocity is 3.13 m/s, and with an assumed right atrial pressure of 15 mmHg, the estimated right ventricular systolic pressure is 34.7 mmHg. Left Atrium: Left atrial size was normal in size. Right Atrium: Right atrial size was normal in size. Pericardium: There is no evidence of pericardial effusion. Mitral Valve: The mitral valve is abnormal. Moderate to severe mitral valve regurgitation. No evidence of mitral valve stenosis. Tricuspid Valve: The tricuspid valve is normal in structure. Tricuspid valve regurgitation is mild to moderate. Aortic Valve: The aortic valve is tricuspid. Aortic valve regurgitation is trivial. No aortic stenosis is present. Pulmonic Valve: The pulmonic valve was  not well visualized. Pulmonic valve regurgitation  is not visualized. Aorta: The aortic root is normal in size and structure. Venous: The inferior vena cava is dilated in size with less than 50% respiratory variability, suggesting right atrial pressure of 15 mmHg. IAS/Shunts: The interatrial septum was not well visualized.  LEFT VENTRICLE PLAX 2D LVIDd:         3.10 cm   Diastology LVIDs:         2.10 cm   LV e' medial:    4.79 cm/s LV PW:         0.80 cm   LV E/e' medial:  19.5 LV IVS:        0.60 cm   LV e' lateral:   5.22 cm/s LVOT diam:     1.70 cm   LV E/e' lateral: 17.9 LV SV:         22 LV SV Index:   18 LVOT Area:     2.27 cm  RIGHT VENTRICLE RV S prime:     9.57 cm/s TAPSE (M-mode): 1.3 cm LEFT ATRIUM             Index        RIGHT ATRIUM          Index LA diam:        2.90 cm 2.43 cm/m   RA Area:     9.69 cm LA Vol (A2C):   30.3 ml 25.36 ml/m  RA Volume:   21.40 ml 17.91 ml/m LA Vol (A4C):   29.7 ml 24.86 ml/m LA Biplane Vol: 30.4 ml 25.44 ml/m  AORTIC VALVE LVOT Vmax:   63.80 cm/s LVOT Vmean:  47.400 cm/s LVOT VTI:    0.096 m  AORTA Ao Root diam: 3.00 cm MITRAL VALVE                  TRICUSPID VALVE MV Area (PHT): 4.93 cm       TR Peak grad:   39.2 mmHg MV Decel Time: 154 msec       TR Vmax:        313.00 cm/s MR Peak grad:    72.2 mmHg MR Mean grad:    50.0 mmHg    SHUNTS MR Vmax:         425.00 cm/s  Systemic VTI:  0.10 m MR Vmean:        329.0 cm/s   Systemic Diam: 1.70 cm MR PISA:         1.90 cm MR PISA Eff ROA: 16 mm MR PISA Radius:  0.55 cm MV E velocity: 93.60 cm/s MV A velocity: 79.60 cm/s MV E/A ratio:  1.18 Oswaldo Milian MD Electronically signed by Oswaldo Milian MD Signature Date/Time: 06/21/2021/3:11:35 PM    Final    CT Angio Chest PE W and/or Wo Contrast  Result Date: 06/21/2021 CLINICAL DATA:  76 year old female status post syncope and fall. History of lung cancer, left lower lobe radiation fibrosis, emphysema. EXAM: CT ANGIOGRAPHY CHEST WITH CONTRAST TECHNIQUE:  Multidetector CT imaging of the chest was performed using the standard protocol during bolus administration of intravenous contrast. Multiplanar CT image reconstructions and MIPs were obtained to evaluate the vascular anatomy. RADIATION DOSE REDUCTION: This exam was performed according to the departmental dose-optimization program which includes automated exposure control, adjustment of the mA and/or kV according to patient size and/or use of iterative reconstruction technique. CONTRAST:  47mL OMNIPAQUE IOHEXOL 350 MG/ML SOLN COMPARISON:  Restaging chest CT 05/05/2020. FINDINGS: Cardiovascular: Excellent contrast bolus timing in the pulmonary arterial  tree. No focal filling defect identified in the pulmonary arteries to suggest acute pulmonary embolism. Calcified aortic atherosclerosis. Calcified coronary artery atherosclerosis. No cardiomegaly. No definite pericardial effusion. However, there is a small volume of contrast reflux into the hepatic IVC and hepatic veins. Mediastinum/Nodes: No definite mediastinal mass or lymphadenopathy. Lungs/Pleura: New moderate-sized right pleural effusion, mostly layering and with simple fluid density. Chronic but increased small left side pleural effusion, chronically somewhat loculated at the lung base (series 5, image 88). Major airways remain patent but there is progressive consolidation of the left lower lobe from last year. Underlying centrilobular emphysema. Mild diffuse increased pulmonary interstitial density in both lungs. Mild compressive atelectasis also on the right. Upper Abdomen: Negative visible centrally noncontrast liver, spleen, pancreas, adrenal glands, kidneys, stomach in the upper abdomen. Musculoskeletal: Osteopenia. Chronic T9 through T12 compression fractures, some previously augmented. No acute or suspicious osseous lesion identified. Review of the MIP images confirms the above findings. IMPRESSION: 1. Negative for acute pulmonary embolus. 2. Progressed  left lower lobe consolidation since last year, superimposed on post treatment pulmonary fibrosis. This might be atelectasis (see #3), but Pneumonia is not excluded. 3. Constellation suspicious for a degree of pulmonary edema and right heart failure superimposed on chronic lung disease with Emphysema (ICD10-J43.9), including new moderate size right pleural effusion and increased partially loculated left pleural effusion. 4. Calcified coronary artery and Aortic Atherosclerosis (ICD10-I70.0). Electronically Signed   By: Genevie Ann M.D.   On: 06/21/2021 06:22   CT Cervical Spine Wo Contrast  Result Date: 06/21/2021 CLINICAL DATA:  Neck trauma (Age >= 65y).  Syncope, fall EXAM: CT CERVICAL SPINE WITHOUT CONTRAST TECHNIQUE: Multidetector CT imaging of the cervical spine was performed without intravenous contrast. Multiplanar CT image reconstructions were also generated. RADIATION DOSE REDUCTION: This exam was performed according to the departmental dose-optimization program which includes automated exposure control, adjustment of the mA and/or kV according to patient size and/or use of iterative reconstruction technique. COMPARISON:  None Available. FINDINGS: Alignment: No subluxation Skull base and vertebrae: No acute fracture. No primary bone lesion or focal pathologic process. Soft tissues and spinal canal: No prevertebral fluid or swelling. No visible canal hematoma. Disc levels: Diffuse degenerative disc disease with disc space narrowing and early spurring. Upper chest: Centrilobular emphysema. Bilateral pleural effusions partially imaged. Other: None IMPRESSION: No acute bony abnormality. Layering bilateral pleural effusions.  Emphysema. Electronically Signed   By: Rolm Baptise M.D.   On: 06/21/2021 00:14   CT Head Wo Contrast  Result Date: 06/21/2021 CLINICAL DATA:  Head trauma, minor (Age >= 65y).  Syncope, fall EXAM: CT HEAD WITHOUT CONTRAST TECHNIQUE: Contiguous axial images were obtained from the base of  the skull through the vertex without intravenous contrast. RADIATION DOSE REDUCTION: This exam was performed according to the departmental dose-optimization program which includes automated exposure control, adjustment of the mA and/or kV according to patient size and/or use of iterative reconstruction technique. COMPARISON:  None Available. FINDINGS: Brain: No acute intracranial abnormality. Specifically, no hemorrhage, hydrocephalus, mass lesion, acute infarction, or significant intracranial injury. Vascular: No hyperdense vessel or unexpected calcification. Skull: No acute calvarial abnormality. Sinuses/Orbits: No acute findings Other: None IMPRESSION: No acute intracranial abnormality. Electronically Signed   By: Rolm Baptise M.D.   On: 06/21/2021 00:08   DG Chest 2 View  Result Date: 06/20/2021 CLINICAL DATA:  Fall and syncope EXAM: CHEST - 2 VIEW COMPARISON:  01/25/2018, CT chest 05/05/2020 FINDINGS: Advanced emphysema. Small bilateral pleural effusions which appear slightly loculated. Distortion  in the left infrahilar lung corresponding to post therapy changes on recent CT. New left suprahilar somewhat nodular opacity. Stable cardiomediastinal silhouette. Vascular congestion and diffuse interstitial opacity which could be due to mild edema. IMPRESSION: 1. Increased small bilateral pleural effusion which appears slightly loculated. Diffuse increased interstitial opacity which may be due to mild edema 2. Emphysema with similar left infrahilar distortion and consolidation. New somewhat nodular opacity in the left suprahilar lung which could be due to a focus of pneumonia or possibly a lung nodule. Correlation with CT is suggested. Electronically Signed   By: Donavan Foil M.D.   On: 06/20/2021 21:46     ASSESSMENT/PLAN:  This is a very pleasant 76 year old Caucasian female with a history of stage IIIa non-small cell lung cancer, poorly differentiated squamous cell carcinoma.  She presented with a large  superior segment of the left lower lobe mass with questionable left hilar adenopathy.  She was diagnosed in August 2019.    The patient underwent a course of concurrent chemoradiation with carboplatin for an AUC of 2 and paclitaxel 45 mg/m. Status post 7 cycles with a partial response.  The patient is status post 25 cycles of consolidation immunotherapy with Imfinzi 10 mg/kg IV every 2 weeks.  This was completed in December 2020.   The patient was recently hospitalized for syncopal episode.  She is following outpatient with pulmonology and cardiology.  She missed her outpatient appointments with their office.  Patient's oxygen was 83% when arriving to the clinic today.  This responded to 2 L of oxygen and her oxygen quickly improved to 97%.  I discussed with the patient is very critical that she maintain her outpatient follow-ups from her recent hospitalization.  Discussed with her that it can be very dangerous to have hypoxia.  Discussed that there can be significant morbidity and mortality associated with not managing her cardiac valvular disease and lung disease.  I encouraged her to buy a pulse oximeter.  She has supplemental oxygen at home and I instructed her to use it continuously until she can see pulmonology tomorrow and/or purchase a pulse oximeter to be monitoring her oxygen level.  If she is in need of additional supplies or portable oxygen tank, I advised her to call the oxygen supplier.  We have also reached out to Dr. Bari Mantis office and reschedule her appointment for tomorrow morning at 9 AM.  Discussed the importance of making this appointment.  I also strongly encouraged her to be early.  Discussed the importance of following up with their office regarding her COPD and management of her effusions.  We will defer any decisions regarding possible thoracentesis to their office.   She also was supposed to see cardiology on 07/15/2021 and no showed this appointment.  Discussed that it is  important to follow with them regarding her lower extremity swelling which is causing her discomfort.  She has significant valvular disease on recent echocardiogram.  I encouraged that she purchase compression stockings and elevate her legs.  We will defer any decisions regarding Lasix and monitoring of her CMP to their office.  While admitted to the hospital, she had a repeat CT scan.  Dr. Julien Nordmann personally independently reviewed the scan discussed results with the patient today.  The scan did not show any evidence of disease progression. As previously mentioned, her scan showed progressed left lower lobe consolidation since last year and superimposed on post treatment pulmonary fibrosis and degree of pulmonary edema and right heart failure superimposed on chronic  lung disease with emphysema with new moderate right pleural effusion and increased partially loculated left pleural effusion, which was addressed during her recent hospitalization.  From a cancer standpoint, Dr. Julien Nordmann recommends that she continue on observation with a restaging CT scan the chest in 6 months.  Patient has a history of allergy to the IV contrast dye.  She is refusing to take the prednisone premedication for her IV contrast.  Therefore, I ordered her CT scan without contrast.  We will see the patient back for follow-up visit in 6 months to review her scan results.  The patient was advised to call immediately if she has any concerning symptoms in the interval. The patient voices understanding of current disease status and treatment options and is in agreement with the current care plan. All questions were answered. The patient knows to call the clinic with any problems, questions or concerns. We can certainly see the patient much sooner if necessary  Orders Placed This Encounter  Procedures   CT Chest Wo Contrast    Standing Status:   Future    Standing Expiration Date:   07/18/2022    Order Specific Question:   Preferred  imaging location?    Answer:   [facility]   CBC with Differential ([place] Only)    Standing Status:   Future    Standing Expiration Date:   07/19/2022   CMP ([place] only)    Standing Status:   Future    Standing Expiration Date:   07/19/2022     I spent {CHL ONC TIME VISIT - HUDJS:[id number]} counseling the patient face to face. The total time spent in the appointment was {CHL ONC TIME VISIT - IFOYD:[id number]}.  Kalid Ghan L Shamaria Kavan, PA-C 07/18/21

## 2021-07-15 ENCOUNTER — Encounter: Payer: Medicare Other | Admitting: Physician Assistant

## 2021-07-15 ENCOUNTER — Telehealth: Payer: Self-pay | Admitting: Physician Assistant

## 2021-07-15 NOTE — Telephone Encounter (Signed)
.  Called patient to schedule appointment per 7/5 inbasket, patient is aware of date and time.

## 2021-07-17 NOTE — Progress Notes (Signed)
This encounter was created in error - please disregard.

## 2021-07-18 ENCOUNTER — Telehealth: Payer: Self-pay | Admitting: Pulmonary Disease

## 2021-07-18 ENCOUNTER — Other Ambulatory Visit: Payer: Self-pay

## 2021-07-18 ENCOUNTER — Inpatient Hospital Stay (HOSPITAL_BASED_OUTPATIENT_CLINIC_OR_DEPARTMENT_OTHER): Payer: Medicare Other | Admitting: Physician Assistant

## 2021-07-18 VITALS — BP 118/77 | HR 102 | Temp 98.1°F | Resp 16 | Ht 61.0 in | Wt 72.6 lb

## 2021-07-18 DIAGNOSIS — Z923 Personal history of irradiation: Secondary | ICD-10-CM | POA: Diagnosis not present

## 2021-07-18 DIAGNOSIS — M8580 Other specified disorders of bone density and structure, unspecified site: Secondary | ICD-10-CM | POA: Diagnosis not present

## 2021-07-18 DIAGNOSIS — R630 Anorexia: Secondary | ICD-10-CM | POA: Diagnosis not present

## 2021-07-18 DIAGNOSIS — C3432 Malignant neoplasm of lower lobe, left bronchus or lung: Secondary | ICD-10-CM

## 2021-07-18 DIAGNOSIS — Z9221 Personal history of antineoplastic chemotherapy: Secondary | ICD-10-CM | POA: Diagnosis not present

## 2021-07-18 DIAGNOSIS — J8410 Pulmonary fibrosis, unspecified: Secondary | ICD-10-CM | POA: Diagnosis not present

## 2021-07-18 DIAGNOSIS — M7989 Other specified soft tissue disorders: Secondary | ICD-10-CM | POA: Diagnosis not present

## 2021-07-18 DIAGNOSIS — C3492 Malignant neoplasm of unspecified part of left bronchus or lung: Secondary | ICD-10-CM | POA: Diagnosis not present

## 2021-07-18 DIAGNOSIS — R42 Dizziness and giddiness: Secondary | ICD-10-CM | POA: Diagnosis not present

## 2021-07-18 DIAGNOSIS — J90 Pleural effusion, not elsewhere classified: Secondary | ICD-10-CM | POA: Diagnosis not present

## 2021-07-18 DIAGNOSIS — J449 Chronic obstructive pulmonary disease, unspecified: Secondary | ICD-10-CM | POA: Diagnosis not present

## 2021-07-18 DIAGNOSIS — R5383 Other fatigue: Secondary | ICD-10-CM | POA: Diagnosis not present

## 2021-07-18 DIAGNOSIS — J811 Chronic pulmonary edema: Secondary | ICD-10-CM | POA: Diagnosis not present

## 2021-07-18 DIAGNOSIS — I50810 Right heart failure, unspecified: Secondary | ICD-10-CM | POA: Diagnosis not present

## 2021-07-18 NOTE — Telephone Encounter (Signed)
Wanting to or pt a humidifier.Sherry [name]

## 2021-07-18 NOTE — Telephone Encounter (Signed)
Attempted calling [place] back on number provided but was unable to reach anyone.   Messaged the PA on Epic to follow up with her in regards to patient. She states that she is going to forward her office notes to Dr Elsworth Soho from today to make him aware.   Cassandra PA states in [place] chat    "She is not compliant with her appointments and missed several appointments with you guys and cardiology. Her oxygen was 83% in the office today which responded quickly to 2L of oxygen. She has oxygen at home but doesn't know how to use it or how she is supposed to wear it. She has a loculated effusion too so I just wanted her to re-establish ASAP to address her emphysema and management of her loculated effusion. We see her once every 6 months for her history of lung cancer for which she has not been treated since 2020."  Will forward to Dr Elsworth Soho as an Juluis Rainier so he is aware.

## 2021-07-19 ENCOUNTER — Encounter: Payer: Self-pay | Admitting: Adult Health

## 2021-07-19 ENCOUNTER — Encounter: Payer: Self-pay | Admitting: Internal Medicine

## 2021-07-19 ENCOUNTER — Ambulatory Visit (INDEPENDENT_AMBULATORY_CARE_PROVIDER_SITE_OTHER): Payer: Medicare Other | Admitting: Adult Health

## 2021-07-19 VITALS — BP 102/70 | HR 102 | Temp 98.5°F | Ht 61.0 in | Wt 72.6 lb

## 2021-07-19 DIAGNOSIS — C3492 Malignant neoplasm of unspecified part of left bronchus or lung: Secondary | ICD-10-CM

## 2021-07-19 DIAGNOSIS — I5033 Acute on chronic diastolic (congestive) heart failure: Secondary | ICD-10-CM | POA: Diagnosis not present

## 2021-07-19 DIAGNOSIS — J449 Chronic obstructive pulmonary disease, unspecified: Secondary | ICD-10-CM | POA: Diagnosis not present

## 2021-07-19 DIAGNOSIS — J9611 Chronic respiratory failure with hypoxia: Secondary | ICD-10-CM

## 2021-07-19 DIAGNOSIS — T148XXA Other injury of unspecified body region, initial encounter: Secondary | ICD-10-CM | POA: Insufficient documentation

## 2021-07-19 DIAGNOSIS — E43 Unspecified severe protein-calorie malnutrition: Secondary | ICD-10-CM

## 2021-07-19 DIAGNOSIS — J441 Chronic obstructive pulmonary disease with (acute) exacerbation: Secondary | ICD-10-CM

## 2021-07-19 LAB — CBC WITH DIFFERENTIAL/PLATELET
Basophils Absolute: 0 10*3/uL (ref 0.0–0.1)
Basophils Relative: 0.9 % (ref 0.0–3.0)
Eosinophils Absolute: 0.1 10*3/uL (ref 0.0–0.7)
Eosinophils Relative: 1.3 % (ref 0.0–5.0)
HCT: 40 % (ref 36.0–46.0)
Hemoglobin: 12.6 g/dL (ref 12.0–15.0)
Lymphocytes Relative: 15.7 % (ref 12.0–46.0)
Lymphs Abs: 0.6 10*3/uL — ABNORMAL LOW (ref 0.7–4.0)
MCHC: 31.5 g/dL (ref 30.0–36.0)
MCV: 89.7 fl (ref 78.0–100.0)
Monocytes Absolute: 0.4 10*3/uL (ref 0.1–1.0)
Monocytes Relative: 9.1 % (ref 3.0–12.0)
Neutro Abs: 3 10*3/uL (ref 1.4–7.7)
Neutrophils Relative %: 73 % (ref 43.0–77.0)
Platelets: 111 10*3/uL — ABNORMAL LOW (ref 150.0–400.0)
RBC: 4.46 Mil/uL (ref 3.87–5.11)
RDW: 18.4 % — ABNORMAL HIGH (ref 11.5–15.5)
WBC: 4.1 10*3/uL (ref 4.0–10.5)

## 2021-07-19 LAB — COMPREHENSIVE METABOLIC PANEL
ALT: 7 U/L (ref 0–35)
AST: 15 U/L (ref 0–37)
Albumin: 3 g/dL — ABNORMAL LOW (ref 3.5–5.2)
Alkaline Phosphatase: 64 U/L (ref 39–117)
BUN: 23 mg/dL (ref 6–23)
CO2: 35 mEq/L — ABNORMAL HIGH (ref 19–32)
Calcium: 8.4 mg/dL (ref 8.4–10.5)
Chloride: 103 mEq/L (ref 96–112)
Creatinine, Ser: 0.97 mg/dL (ref 0.40–1.20)
GFR: 56.64 mL/min — ABNORMAL LOW (ref >60.00)
Glucose, Bld: 78 mg/dL (ref 70–99)
Potassium: 3.5 mEq/L (ref 3.5–5.1)
Sodium: 144 mEq/L (ref 135–145)
Total Bilirubin: 0.9 mg/dL (ref 0.2–1.2)
Total Protein: 5.3 g/dL — ABNORMAL LOW (ref 6.0–8.3)

## 2021-07-19 MED ORDER — TRELEGY ELLIPTA 100-62.5-25 MCG/ACT IN AEPB: 1.0000 | INHALATION_SPRAY | Freq: Every day | RESPIRATORY_TRACT | 5 refills | Status: DC

## 2021-07-19 MED ORDER — POTASSIUM CHLORIDE CRYS ER 20 MEQ PO TBCR: EXTENDED_RELEASE_TABLET | ORAL | 0 refills | Status: DC

## 2021-07-19 MED ORDER — FUROSEMIDE 20 MG PO TABS: 20.0000 mg | ORAL_TABLET | Freq: Every day | ORAL | 1 refills | Status: DC | PRN

## 2021-07-19 NOTE — Assessment & Plan Note (Signed)
Emphysema with presumed COPD.  Patient needs PFTs. Congratulated on smoking cessation. Recent exacerbation complicated by diastolic CHF exacerbation now appears to be back to her baseline. Patient is encouraged on medication compliance.  Recommend to restart triple therapy inhaler with Trelegy. Use oxygen at all times.  Continue with mucociliary clearance  Plan  Patient Instructions  Continue on TRELEGY 1 puff daily  Mucinex DM Twice daily  As needed  cough/congestion  Continue on Oxygen 2l/m , need to wear at all times.  Order for portable small tanks . DME to come to home to go over oxygen set up .  Begin Lasix 20mg  daily for 2 days, (do not take until I call today with lab results ) Once labs are back will decide on Potassium prescription.  High calorie/protein diet. Try smoothie with protein.  Great job on not smoking.  Activity as tolerated.  Labs today  Elevate legs, avoid scratching if possible . Wash gently with soap, rinse well and pat dry . May apply neosporin to abrasions daily . Call if redness increases.  Follow up in 3-4 weeks with Dr. Elsworth Soho  or Ramatoulaye Pack NP with PFTs and As needed   Please contact office for sooner follow up if symptoms do not improve or worsen or seek emergency care

## 2021-07-19 NOTE — Addendum Note (Signed)
Addended by: Melvenia Needles on: 07/19/2021 05:27 PM   Modules accepted: Orders

## 2021-07-19 NOTE — Patient Instructions (Addendum)
Continue on TRELEGY 1 puff daily  Mucinex DM Twice daily  As needed  cough/congestion  Continue on Oxygen 2l/m , need to wear at all times.  Order for portable small tanks . DME to come to home to go over oxygen set up .  Begin Lasix 20mg  daily for 2 days, (do not take until I call today with lab results ) Once labs are back will decide on Potassium prescription.  High calorie/protein diet. Try smoothie with protein.  Great job on not smoking.  Activity as tolerated.  Labs today  Elevate legs, avoid scratching if possible . Wash gently with soap, rinse well and pat dry . May apply neosporin to abrasions daily . Call if redness increases.  Follow up in 3-4 weeks with Dr. Elsworth Soho  or Hally Colella NP with PFTs and As needed   Please contact office for sooner follow up if symptoms do not improve or worsen or seek emergency care

## 2021-07-19 NOTE — Assessment & Plan Note (Signed)
Followed by oncology status post chemoradiation and immunotherapy.  Recent CT chest without evidence of recurrent/metastatic disease. Continue with CT surveillance Smoking cessation is encouraged

## 2021-07-19 NOTE — Progress Notes (Signed)
@Patient  ID: Sherry [name], female    DOB: 11-Jul-1945, 76 y.o.   MRN: [id number]  Chief Complaint  Patient presents with   Acute Visit    Referring provider: [name], NP  HPI: 76 year old female, smoker seen for pulmonary consult during hospitalization June 2023 for acute hypoxic and hypercarbic respiratory failure, pleural effusion and COPD flare  Medical history significant for emphysema, non-small cell lung cancer status postchemotherapy, radiation and immunotherapy followed by Dr. Earlie Server  TEST/EVENTS :  CT chest 06/21/21 -Negative for acute pulmonary embolus.   2. Progressed left lower lobe consolidation since last year, superimposed on post treatment pulmonary fibrosis. This might be atelectasis (see #3), but Pneumonia is not excluded.   3. Constellation suspicious for a degree of pulmonary edema and right heart failure superimposed on chronic lung disease with Emphysema (ICD10-J43.9), including new moderate size right pleural effusion and increased partially loculated left pleural effusion.  07/19/2021 Follow up ; COPD w/ Emphysema, O2 RF and Lung Cancer, post hospital follow-up Patient returns for a posthospital follow-up.  Patient was admitted last month for acute on chronic respiratory failure.  Prior to admission she had a syncopal episode initial ABG showed pH at 7.2, PCO2 77 and a PO2 at 80.  She refused BiPAP.  CT chest on admission showed progressive left lower lobe consolidation with new moderate right pleural effusion and increased partially loculated left effusion.  2D echo showed grade 2 diastolic dysfunction and moderate MR. BNP was elevated at 1,600 patient was diuresed for suspected decompensated diastolic heart failure.  Treated with steroids for possible COPD exacerbation.  She was discharged on oxygen.Has not smoked since discharged from hospital.  Started on [facility] inhaler but not taking. We discussed the importance of medication compliance. Patient  education was given.  Has low appetite , weight is 72lbs with BMI at 13 .  Complains of persistent ankle edema.  Says that her legs itch has been scratchy.  Has some abrasions along the lower extremities. Patient has not been using her oxygen on a consistent basis.  Supposed to be on 2 L.  Patient says that she has a hard time using her machine.  She does not have any portable tanks.  She says she did not receive much instruction on her oxygen.  We went over the importance of oxygen use.  O2 saturations were 79% on room air.  Advised on the dangers of hypoxemia.  Today in the office was unable to tolerate POC, unable to keep O2 saturations greater than 88 to 90% on 5 L pulsed.  Patient requires continuous flow at 2 L to maintain O2 saturations greater than 88 to 90%. She says she gets very short of breath with minimum activity.  She is sedentary. Patient was seen in the emergency room on July 05, 2021 with near syncope.  Potassium was low.  Patient was started on potassium.  Is unclear if she was able to get the prescription.  Lab work is pending today.     Allergies  Allergen Reactions   Omnipaque [Iohexol] Hives and Itching    Patient had a late reaction of itching and hives after scan will need premeds   Penicillins Hives    Has patient had a PCN reaction causing immediate rash, facial/tongue/throat swelling, SOB or lightheadedness with hypotension: no Has patient had a PCN reaction causing severe rash involving mucus membranes or skin necrosis: No Has patient had a PCN reaction that required hospitalization: No Has patient had a  PCN reaction occurring within the last 10 years: No If all of the above answers are "NO", then may proceed with Cephalosporin use.     Immunization History  Administered Date(s) Administered   Influenza Split 12/10/2011   Influenza, High Dose Seasonal PF 10/11/2017   Influenza-Unspecified 12/10/2011, 09/23/2013, 09/23/2013, 10/15/2014, 10/15/2014, 10/20/2015,  10/20/2015   Pneumococcal Conjugate-13 11/28/2013, 11/28/2013, 03/27/2014, 03/27/2014   Pneumococcal Polysaccharide-23 06/18/2012, 06/18/2012    Past Medical History:  Diagnosis Date   Allergy    Anxiety    Arthritis    Cancer ([place])    Cavitating mass in left lower lung lobe    Complication of anesthesia    woke up during procedure   COPD (chronic obstructive pulmonary disease) ([place])    not on home O2   Depression    Family history of adverse reaction to anesthesia    " my mother was always hard to wake up"   Full dentures    Headache    Neuromuscular disorder ([place])    Pneumonia    Thoracic spine fracture ([place]) 11/01/2017   T9   Wears glasses     Tobacco History: Social History   Tobacco Use  Smoking Status Every Day   Packs/day: 1.00   Years: 50.00   Total pack years: 50.00   Types: Cigarettes  Smokeless Tobacco Never  Tobacco Comments   No smoking since June 12th 2023 07/19/2021 hfb   Ready to quit: Yes Counseling given: Yes Tobacco comments: No smoking since June 12th 2023 07/19/2021 hfb   Outpatient Medications Prior to Visit  Medication Sig Dispense Refill   acetaminophen (TYLENOL 8 HOUR) 650 MG CR tablet Take 1 tablet (650 mg total) by mouth every 8 (eight) hours as needed for pain or fever. 30 tablet 0   albuterol (VENTOLIN HFA) 108 (90 Base) MCG/ACT inhaler Inhale 2 puffs into the lungs every 6 (six) hours as needed for wheezing or shortness of breath. 8.5 g 1   HYDROcodone-acetaminophen (NORCO/VICODIN) 5-325 MG tablet Take 1 tablet by mouth every 6 (six) hours as needed for moderate pain.     meclizine (ANTIVERT) 12.5 MG tablet Take 1 tablet (12.5 mg total) by mouth 2 (two) times daily as needed for dizziness (vertigo). 30 tablet 0   Fluticasone-Umeclidin-Vilant (TRELEGY ELLIPTA) 100-62.5-25 MCG/ACT AEPB Inhale 1 Inhalation into the lungs daily. 60 each 1   nicotine (NICODERM CQ - DOSED IN MG/24 HR) 7 mg/24hr patch Place 1 patch (7 mg total) onto the skin  daily. (Patient not taking: Reported on 07/18/2021) 28 patch 0   potassium bicarbonate (K-LYTE) 25 MEQ disintegrating tablet Take 1 tablet (25 mEq total) by mouth daily for 5 days. 5 tablet 0   predniSONE (DELTASONE) 10 MG tablet Take 4 tabs for 3 days, then 3 tabs for 3 days, then 2 tabs for 3 days, then 1 tab for 3 days, then 1/2 tab for 4 days. (Patient not taking: Reported on 07/18/2021) 32 tablet 0   No facility-administered medications prior to visit.     Review of Systems:   Constitutional:   No  weight loss, night sweats,  Fevers, chills, + fatigue, or  lassitude.  HEENT:   No headaches,  Difficulty swallowing,  Tooth/dental problems, or  Sore throat,                No sneezing, itching, ear ache, nasal congestion, post nasal drip,   CV:  No chest pain,  Orthopnea, PND, swelling in lower extremities, anasarca, dizziness, palpitations,  syncope.   GI  No heartburn, indigestion, abdominal pain, nausea, vomiting, diarrhea, change in bowel habits, loss of appetite, bloody stools.   Resp:   No chest wall deformity  Skin: no rash or lesions.  GU: no dysuria, change in color of urine, no urgency or frequency.  No flank pain, no hematuria   MS:  No joint pain or swelling.  No decreased range of motion.  No back pain.    Physical Exam  BP 102/70 (BP Location: Left Arm, Patient Position: Sitting, Cuff Size: Normal)   Pulse (!) 102   Temp 98.5 F (36.9 C) (Oral)   Ht 5' 1"  (1.549 m)   Wt 72 lb 9.6 oz (32.9 kg)   SpO2 (!) 79% Comment: placed on 2L, sats up to 95%  BMI 13.72 kg/m   GEN: A/Ox3; pleasant , NAD, frail, elderly, on oxygen, cachectic   HEENT:  [place]/AT,  NOSE-clear, THROAT-clear, no lesions, no postnasal drip or exudate noted.   NECK:  Supple w/ fair ROM; no JVD; normal carotid impulses w/o bruits; no thyromegaly or nodules palpated; no lymphadenopathy.    RESP decreased breath sounds in the bases. no accessory muscle use, no dullness to percussion  CARD:  RRR, no  m/r/g, 1-2 + peripheral edema, pulses intact, no cyanosis or clubbing.  GI:   Soft & nt; nml bowel sounds; no organomegaly or masses detected.   Musco: Warm bil, no deformities or joint swelling noted.   Neuro: alert, no focal deficits noted.    Skin: Warm, no lesions or rashes    Lab Results:  CBC   BMET   BNP    Component Value Date/Time   BNP 1,691.3 (H) 06/21/2021 1614    ProBNP No results found for: "PROBNP"  Imaging: DG Chest 2 View  Result Date: 07/05/2021 CLINICAL DATA:  Shortness of breath and chest pain. Lung cancer with radiation fibrosis and emphysema. EXAM: CHEST - 2 VIEW COMPARISON:  06/21/2021 chest CT and prior chest radiograph from 06/20/2021 FINDINGS: Essentially stable moderate bilateral pleural effusions, likely with loculated components. Mildly reduced aeration at the left lung base. Emphysema. Atherosclerotic calcification of the aortic arch. Bony demineralization. Stable vertebral augmentations at 3 consecutive mid to lower thoracic levels. IMPRESSION: 1. Stable moderate size loculated pleural effusions with associated passive atelectasis. 2. Mildly reduced aeration at the left lung base, possibly from progressive atelectasis, radiation pneumonitis, or pneumonia. 3. Aortic Atherosclerosis (ICD10-I70.0) and Emphysema (ICD10-J43.9). Electronically Signed   By: Van Clines M.D.   On: 07/05/2021 09:51   ECHOCARDIOGRAM COMPLETE  Result Date: 06/21/2021    ECHOCARDIOGRAM REPORT   Patient Name:   MATTEA SEGER Date of Exam: 06/21/2021 Medical Rec #:  [id number]      Height:       60.0 in Accession #:    [id number]     Weight:       70.0 lb Date of Birth:  1945/08/20      BSA:          1.195 m Patient Age:    33 years       BP:           116/81 mmHg Patient Gender: F              HR:           90 bpm. Exam Location:  Inpatient Procedure: 2D Echo, Color Doppler and Cardiac Doppler Indications:    R06.9 DOE  History:  Patient has no prior history of  Echocardiogram examinations.                 COPD; Risk Factors:Lung Cancer.  Sonographer:    Raquel Sarna Senior RDCS Referring Phys: [id number] JENNIFER YATES  Sonographer Comments: Difficult windows due to very small body habitus IMPRESSIONS  1. Left ventricular ejection fraction, by estimation, is 55 to 60%. The left ventricle has normal function. The left ventricle has no regional wall motion abnormalities. Left ventricular diastolic parameters are consistent with Grade II diastolic dysfunction (pseudonormalization). Elevated left atrial pressure.  2. Right ventricular systolic function is mildly reduced. The right ventricular size is mildly enlarged. There is moderately elevated pulmonary artery systolic pressure. The estimated right ventricular systolic pressure is 15.7 mmHg.  3. The mitral valve is abnormal. Moderate to severe mitral valve regurgitation. No evidence of mitral stenosis. Posterior directed MR jet. Normal LA/LV size, low E wave velocity suggests likely moderate MR  4. Tricuspid valve regurgitation is mild to moderate.  5. The aortic valve is tricuspid. Aortic valve regurgitation is trivial. No aortic stenosis is present.  6. The inferior vena cava is dilated in size with <50% respiratory variability, suggesting right atrial pressure of 15 mmHg. FINDINGS  Left Ventricle: Left ventricular ejection fraction, by estimation, is 55 to 60%. The left ventricle has normal function. The left ventricle has no regional wall motion abnormalities. The left ventricular internal cavity size was small. There is no left ventricular hypertrophy. Left ventricular diastolic parameters are consistent with Grade II diastolic dysfunction (pseudonormalization). Elevated left atrial pressure. Right Ventricle: The right ventricular size is mildly enlarged. Right vetricular wall thickness was not well visualized. Right ventricular systolic function is mildly reduced. There is moderately elevated pulmonary artery systolic pressure. The  tricuspid  regurgitant velocity is 3.13 m/s, and with an assumed right atrial pressure of 15 mmHg, the estimated right ventricular systolic pressure is 26.2 mmHg. Left Atrium: Left atrial size was normal in size. Right Atrium: Right atrial size was normal in size. Pericardium: There is no evidence of pericardial effusion. Mitral Valve: The mitral valve is abnormal. Moderate to severe mitral valve regurgitation. No evidence of mitral valve stenosis. Tricuspid Valve: The tricuspid valve is normal in structure. Tricuspid valve regurgitation is mild to moderate. Aortic Valve: The aortic valve is tricuspid. Aortic valve regurgitation is trivial. No aortic stenosis is present. Pulmonic Valve: The pulmonic valve was not well visualized. Pulmonic valve regurgitation is not visualized. Aorta: The aortic root is normal in size and structure. Venous: The inferior vena cava is dilated in size with less than 50% respiratory variability, suggesting right atrial pressure of 15 mmHg. IAS/Shunts: The interatrial septum was not well visualized.  LEFT VENTRICLE PLAX 2D LVIDd:         3.10 cm   Diastology LVIDs:         2.10 cm   LV e' medial:    4.79 cm/s LV PW:         0.80 cm   LV E/e' medial:  19.5 LV IVS:        0.60 cm   LV e' lateral:   5.22 cm/s LVOT diam:     1.70 cm   LV E/e' lateral: 17.9 LV SV:         22 LV SV Index:   18 LVOT Area:     2.27 cm  RIGHT VENTRICLE RV S prime:     9.57 cm/s TAPSE (M-mode): 1.3 cm LEFT ATRIUM  Index        RIGHT ATRIUM          Index LA diam:        2.90 cm 2.43 cm/m   RA Area:     9.69 cm LA Vol (A2C):   30.3 ml 25.36 ml/m  RA Volume:   21.40 ml 17.91 ml/m LA Vol (A4C):   29.7 ml 24.86 ml/m LA Biplane Vol: 30.4 ml 25.44 ml/m  AORTIC VALVE LVOT Vmax:   63.80 cm/s LVOT Vmean:  47.400 cm/s LVOT VTI:    0.096 m  AORTA Ao Root diam: 3.00 cm MITRAL VALVE                  TRICUSPID VALVE MV Area (PHT): 4.93 cm       TR Peak grad:   39.2 mmHg MV Decel Time: 154 msec       TR Vmax:         313.00 cm/s MR Peak grad:    72.2 mmHg MR Mean grad:    50.0 mmHg    SHUNTS MR Vmax:         425.00 cm/s  Systemic VTI:  0.10 m MR Vmean:        329.0 cm/s   Systemic Diam: 1.70 cm MR PISA:         1.90 cm MR PISA Eff ROA: 16 mm MR PISA Radius:  0.55 cm MV E velocity: 93.60 cm/s MV A velocity: 79.60 cm/s MV E/A ratio:  1.18 Oswaldo Milian MD Electronically signed by Oswaldo Milian MD Signature Date/Time: 06/21/2021/3:11:35 PM    Final    CT Angio Chest PE W and/or Wo Contrast  Result Date: 06/21/2021 CLINICAL DATA:  76 year old female status post syncope and fall. History of lung cancer, left lower lobe radiation fibrosis, emphysema. EXAM: CT ANGIOGRAPHY CHEST WITH CONTRAST TECHNIQUE: Multidetector CT imaging of the chest was performed using the standard protocol during bolus administration of intravenous contrast. Multiplanar CT image reconstructions and MIPs were obtained to evaluate the vascular anatomy. RADIATION DOSE REDUCTION: This exam was performed according to the departmental dose-optimization program which includes automated exposure control, adjustment of the mA and/or kV according to patient size and/or use of iterative reconstruction technique. CONTRAST:  45m OMNIPAQUE IOHEXOL 350 MG/ML SOLN COMPARISON:  Restaging chest CT 05/05/2020. FINDINGS: Cardiovascular: Excellent contrast bolus timing in the pulmonary arterial tree. No focal filling defect identified in the pulmonary arteries to suggest acute pulmonary embolism. Calcified aortic atherosclerosis. Calcified coronary artery atherosclerosis. No cardiomegaly. No definite pericardial effusion. However, there is a small volume of contrast reflux into the hepatic IVC and hepatic veins. Mediastinum/Nodes: No definite mediastinal mass or lymphadenopathy. Lungs/Pleura: New moderate-sized right pleural effusion, mostly layering and with simple fluid density. Chronic but increased small left side pleural effusion, chronically somewhat  loculated at the lung base (series 5, image 88). Major airways remain patent but there is progressive consolidation of the left lower lobe from last year. Underlying centrilobular emphysema. Mild diffuse increased pulmonary interstitial density in both lungs. Mild compressive atelectasis also on the right. Upper Abdomen: Negative visible centrally noncontrast liver, spleen, pancreas, adrenal glands, kidneys, stomach in the upper abdomen. Musculoskeletal: Osteopenia. Chronic T9 through T12 compression fractures, some previously augmented. No acute or suspicious osseous lesion identified. Review of the MIP images confirms the above findings. IMPRESSION: 1. Negative for acute pulmonary embolus. 2. Progressed left lower lobe consolidation since last year, superimposed on post treatment pulmonary fibrosis. This might be atelectasis (see #  3), but Pneumonia is not excluded. 3. Constellation suspicious for a degree of pulmonary edema and right heart failure superimposed on chronic lung disease with Emphysema (ICD10-J43.9), including new moderate size right pleural effusion and increased partially loculated left pleural effusion. 4. Calcified coronary artery and Aortic Atherosclerosis (ICD10-I70.0). Electronically Signed   By: Genevie Ann M.D.   On: 06/21/2021 06:22   CT Cervical Spine Wo Contrast  Result Date: 06/21/2021 CLINICAL DATA:  Neck trauma (Age >= 65y).  Syncope, fall EXAM: CT CERVICAL SPINE WITHOUT CONTRAST TECHNIQUE: Multidetector CT imaging of the cervical spine was performed without intravenous contrast. Multiplanar CT image reconstructions were also generated. RADIATION DOSE REDUCTION: This exam was performed according to the departmental dose-optimization program which includes automated exposure control, adjustment of the mA and/or kV according to patient size and/or use of iterative reconstruction technique. COMPARISON:  None Available. FINDINGS: Alignment: No subluxation Skull base and vertebrae: No acute  fracture. No primary bone lesion or focal pathologic process. Soft tissues and spinal canal: No prevertebral fluid or swelling. No visible canal hematoma. Disc levels: Diffuse degenerative disc disease with disc space narrowing and early spurring. Upper chest: Centrilobular emphysema. Bilateral pleural effusions partially imaged. Other: None IMPRESSION: No acute bony abnormality. Layering bilateral pleural effusions.  Emphysema. Electronically Signed   By: Rolm Baptise M.D.   On: 06/21/2021 00:14   CT Head Wo Contrast  Result Date: 06/21/2021 CLINICAL DATA:  Head trauma, minor (Age >= 65y).  Syncope, fall EXAM: CT HEAD WITHOUT CONTRAST TECHNIQUE: Contiguous axial images were obtained from the base of the skull through the vertex without intravenous contrast. RADIATION DOSE REDUCTION: This exam was performed according to the departmental dose-optimization program which includes automated exposure control, adjustment of the mA and/or kV according to patient size and/or use of iterative reconstruction technique. COMPARISON:  None Available. FINDINGS: Brain: No acute intracranial abnormality. Specifically, no hemorrhage, hydrocephalus, mass lesion, acute infarction, or significant intracranial injury. Vascular: No hyperdense vessel or unexpected calcification. Skull: No acute calvarial abnormality. Sinuses/Orbits: No acute findings Other: None IMPRESSION: No acute intracranial abnormality. Electronically Signed   By: Rolm Baptise M.D.   On: 06/21/2021 00:08   DG Chest 2 View  Result Date: 06/20/2021 CLINICAL DATA:  Fall and syncope EXAM: CHEST - 2 VIEW COMPARISON:  01/25/2018, CT chest 05/05/2020 FINDINGS: Advanced emphysema. Small bilateral pleural effusions which appear slightly loculated. Distortion in the left infrahilar lung corresponding to post therapy changes on recent CT. New left suprahilar somewhat nodular opacity. Stable cardiomediastinal silhouette. Vascular congestion and diffuse interstitial  opacity which could be due to mild edema. IMPRESSION: 1. Increased small bilateral pleural effusion which appears slightly loculated. Diffuse increased interstitial opacity which may be due to mild edema 2. Emphysema with similar left infrahilar distortion and consolidation. New somewhat nodular opacity in the left suprahilar lung which could be due to a focus of pneumonia or possibly a lung nodule. Correlation with CT is suggested. Electronically Signed   By: Donavan Foil M.D.   On: 06/20/2021 21:46          No data to display          No results found for: "NITRICOXIDE"      Assessment & Plan:   COPD with acute exacerbation ([place]) Emphysema with presumed COPD.  Patient needs PFTs. Congratulated on smoking cessation. Recent exacerbation complicated by diastolic CHF exacerbation now appears to be back to her baseline. Patient is encouraged on medication compliance.  Recommend to restart triple therapy inhaler  with Trelegy. Use oxygen at all times.  Continue with mucociliary clearance  Plan  Patient Instructions  Continue on TRELEGY 1 puff daily  Mucinex DM Twice daily  As needed  cough/congestion  Continue on Oxygen 2l/m , need to wear at all times.  Order for portable small tanks . DME to come to home to go over oxygen set up .  Begin Lasix 27m daily for 2 days, (do not take until I call today with lab results ) Once labs are back will decide on Potassium prescription.  High calorie/protein diet. Try smoothie with protein.  Great job on not smoking.  Activity as tolerated.  Labs today  Elevate legs, avoid scratching if possible . Wash gently with soap, rinse well and pat dry . May apply neosporin to abrasions daily . Call if redness increases.  Follow up in 3-4 weeks with Dr. 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  Recent CT chest without evidence of recurrent/metastatic disease. Continue with CT surveillance Smoking cessation is encouraged  Acute on chronic diastolic CHF (congestive heart failure) (HCC) Recent decompensated diastolic heart failure.  Patient has significant lower extremity edema. Would like to begin gentle diuresis.  However she has had hypokalemia.  We will check stat be met if potassium is okay we will begin Lasix along with K. Dur 20 mEq for 2 days. Advised to keep legs elevated  Plan  Patient Instructions  Continue on TRELEGY 1 puff daily  Mucinex DM Twice daily  As needed  cough/congestion  Continue on Oxygen 2l/m , need to wear at all times.  Order for portable small tanks . DME to come to home to go over oxygen set up .  Begin Lasix 287mdaily for 2 days, (do not take until I call today with lab results ) Once labs are back will decide on Potassium prescription.  High calorie/protein diet. Try smoothie with protein.  Great job on not smoking.  Activity as tolerated.  Labs today  Elevate legs, avoid scratching if possible . Wash gently with soap, rinse well and pat dry . May apply neosporin to abrasions daily . Call if redness increases.  Follow up in 3-4 weeks with Dr. AlElsworth Sohoor Tallie Hevia NP with PFTs and As needed   Please contact office for sooner follow up if symptoms do not improve or worsen or seek emergency care        Protein-calorie malnutrition, severe Very severe protein calorie malnutrition.  Current weight is at 72 pounds with a BMI 13.  Patient is encouraged on high-calorie high-protein diet.  Advised to eat often every 2 hours during the daytime if possible. Advised on protein supplements such as Ensure or smoothie proteins.  Chronic respiratory failure with hypoxia (HCC) Encouraged on oxygen compliance.  Continue on O2 to keep O2 saturations greater than 88 to 9%.  We have  contacted her DME company to help with patient education and advised on usage of her concentrator.  She is unable to tolerate POC.  Will need small portable tanks for activity.  Abrasion of skin Lower extremity skin abrasions from scratching.  Patient is encouraged on wound care.  Watch for signs and symptoms of infection such as redness drainage  or swelling.,  Fever. Wound care education given  Plan  Patient Instructions  Continue on TRELEGY 1 puff daily  Mucinex DM Twice daily  As needed  cough/congestion  Continue on Oxygen 2l/m , need to wear at all times.  Order for portable small tanks . DME to come to home to go over oxygen set up .  Begin Lasix 31m daily for 2 days, (do not take until I call today with lab results ) Once labs are back will decide on Potassium prescription.  High calorie/protein diet. Try smoothie with protein.  Great job on not smoking.  Activity as tolerated.  Labs today  Elevate legs, avoid scratching if possible . Wash gently with soap, rinse well and pat dry . May apply neosporin to abrasions daily . Call if redness increases.  Follow up in 3-4 weeks with Dr. AElsworth Soho or Tayelor Osborne NP with PFTs and As needed   Please contact office for sooner follow up if symptoms do not improve or worsen or seek emergency care         I spent 47   minutes dedicated to the care of this patient on the date of this encounter to include pre-visit review of records, face-to-face time with the patient discussing conditions above, post visit ordering of testing, clinical documentation with the electronic health record, making appropriate referrals as documented, and communicating necessary findings to members of the patients care team.   TRexene Edison NP 07/19/2021

## 2021-07-19 NOTE — Assessment & Plan Note (Signed)
Lower extremity skin abrasions from scratching.  Patient is encouraged on wound care.  Watch for signs and symptoms of infection such as redness drainage or swelling.,  Fever. Wound care education given  Plan  Patient Instructions  Continue on TRELEGY 1 puff daily  Mucinex DM Twice daily  As needed  cough/congestion  Continue on Oxygen 2l/m , need to wear at all times.  Order for portable small tanks . DME to come to home to go over oxygen set up .  Begin Lasix 20mg  daily for 2 days, (do not take until I call today with lab results ) Once labs are back will decide on Potassium prescription.  High calorie/protein diet. Try smoothie with protein.  Great job on not smoking.  Activity as tolerated.  Labs today  Elevate legs, avoid scratching if possible . Wash gently with soap, rinse well and pat dry . May apply neosporin to abrasions daily . Call if redness increases.  Follow up in 3-4 weeks with Dr. Elsworth Soho  or Ivor Kishi NP with PFTs and As needed   Please contact office for sooner follow up if symptoms do not improve or worsen or seek emergency care

## 2021-07-19 NOTE — Assessment & Plan Note (Signed)
Very severe protein calorie malnutrition.  Current weight is at 72 pounds with a BMI 13.  Patient is encouraged on high-calorie high-protein diet.  Advised to eat often every 2 hours during the daytime if possible. Advised on protein supplements such as Ensure or smoothie proteins.

## 2021-07-19 NOTE — Assessment & Plan Note (Addendum)
Recent decompensated diastolic heart failure.  Patient has significant lower extremity edema. Would like to begin gentle diuresis.  However she has had hypokalemia.  We will check stat be met if potassium is okay we will begin Lasix along with K. Dur 20 mEq for 2 days. Advised to keep legs elevated Check chest xray on return   Plan  Patient Instructions  Continue on TRELEGY 1 puff daily  Mucinex DM Twice daily  As needed  cough/congestion  Continue on Oxygen 2l/m , need to wear at all times.  Order for portable small tanks . DME to come to home to go over oxygen set up .  Begin Lasix 28m daily for 2 days, (do not take until I call today with lab results ) Once labs are back will decide on Potassium prescription.  High calorie/protein diet. Try smoothie with protein.  Great job on not smoking.  Activity as tolerated.  Labs today  Elevate legs, avoid scratching if possible . Wash gently with soap, rinse well and pat dry . May apply neosporin to abrasions daily . Call if redness increases.  Follow up in 3-4 weeks with Dr. AElsworth Soho or Latanza Pfefferkorn NP with PFTs and As needed   Please contact office for sooner follow up if symptoms do not improve or worsen or seek emergency care

## 2021-07-19 NOTE — Assessment & Plan Note (Signed)
Encouraged on oxygen compliance.  Continue on O2 to keep O2 saturations greater than 88 to 9%.  We have contacted her DME company to help with patient education and advised on usage of her concentrator.  She is unable to tolerate POC.  Will need small portable tanks for activity.

## 2021-07-20 ENCOUNTER — Ambulatory Visit (INDEPENDENT_AMBULATORY_CARE_PROVIDER_SITE_OTHER): Payer: Medicare Other | Admitting: Physician Assistant

## 2021-07-20 ENCOUNTER — Ambulatory Visit (INDEPENDENT_AMBULATORY_CARE_PROVIDER_SITE_OTHER): Payer: Medicare Other

## 2021-07-20 ENCOUNTER — Encounter: Payer: Self-pay | Admitting: Physician Assistant

## 2021-07-20 VITALS — BP 118/70 | HR 92 | Ht 61.0 in | Wt 72.8 lb

## 2021-07-20 DIAGNOSIS — C3492 Malignant neoplasm of unspecified part of left bronchus or lung: Secondary | ICD-10-CM

## 2021-07-20 DIAGNOSIS — R55 Syncope and collapse: Secondary | ICD-10-CM

## 2021-07-20 DIAGNOSIS — J449 Chronic obstructive pulmonary disease, unspecified: Secondary | ICD-10-CM

## 2021-07-20 DIAGNOSIS — E43 Unspecified severe protein-calorie malnutrition: Secondary | ICD-10-CM

## 2021-07-20 DIAGNOSIS — R778 Other specified abnormalities of plasma proteins: Secondary | ICD-10-CM

## 2021-07-20 DIAGNOSIS — M25473 Effusion, unspecified ankle: Secondary | ICD-10-CM

## 2021-07-20 DIAGNOSIS — I340 Nonrheumatic mitral (valve) insufficiency: Secondary | ICD-10-CM

## 2021-07-20 NOTE — Progress Notes (Signed)
Cardiology Office Note:    Date:  07/22/2021   ID:  Sherry [name], DOB May 05, 1945, MRN [id number]  PCP:  [name], NP   [place] Prov[facility] Cardiologist:  Quay Burow, MD     Referring MD: [name], NP   Chief Complaint  Patient presents with   Follow-up    Seen for Dr. Gwenlyn Found    History of Present Illness:    Sherry [name] is a 76 y.o. female with a hx of COPD, non-small cell lung cancer diagnosed in 2019 in remission, severe protein calorie malnutrition, depression, moderate to severe MR and history of syncope.  Patient has been followed by pulmonology service and that has been treated with chemoradiation therapy.  She presented to the emergency room on 06/20/2021 with complaint of syncope.  She reportedly had a syncopal episode.  She was walking in the freezer section at [facility] and subsequently developed nausea and noted multiple colors in her vision.  She walked to the bathroom however did not remember any events after that.  EMS reported she had loss of consciousness that for roughly 10 minutes.  She was tachypneic and hypoxic requiring O2.  Serial troponin was flat.  Creatinine 1.23.  Sodium and potassium normal.  Hemoglobin 12.2.  EKG showed normal sinus rhythm, heart rate 96 bpm.  Chest x-ray showed small bilateral pleural effusion slightly loculated and emphysema.  She was admitted by internal medicine service.  Echocardiogram obtained on 06/21/2021 showed EF 55 to 60%, no regional wall motion abnormality, grade 2 DD, mildly reduced RV, moderately elevated PA pressure, moderate to severe MR, mild to moderate TR.  Cardiology service was consulted to further evaluate.  Her symptom was felt to be vagal in nature.  CTA of chest was negative for PE.  A 2-week heart monitor was recommended for outpatient evaluation.  During hospitalization, patient was given a single dose of IV Lasix with potassium supplement.  She was also treated with Solu-Medrol.  Home oxygen was  arranged.  Since discharge, patient has returned on 07/05/2021 and reported fluttering heart rate for 2 hours in the morning which resolved by the time she went to the ED.  She also had chest pain on and off for 2 days.  Troponin was flat however elevated to 101-->107.  Chest x-ray showed stable moderate size loculated pleural effusion with atelectasis, mildly reduced aeration in the left lung base.  Potassium was low at 2.8, creatinine 1.02.  Patient was given potassium supplement. On arrival, O2 saturation was 73%.  After placed on 2 lpm oxygen, O2 saturation improved to 98%.  She was seen by pulmonology service yesterday who was concerned about volume overload and started her on Lasix and potassium supplement for 2 days then changed to as needed after that.  Patient presents today for follow-up accompanied by her brother.  Since the recent ED visit, she has not had any recurrent chest discomfort.  She is cachectic and on 24/7 oxygen, recent chest discomfort sounds atypical with flat troponin.  As long as chest pain does not recurs, I would not recommend any additional work-up as she is a poor candidate for invasive study.  She remain fairly weak, and awaits a meager 72 pounds.  O2 saturation is 99% on oxygen.  On physical exam, her lung is clear however it has diminished breath sound throughout consistent with her 50-year history of smoking.  She has quit smoking recently.  She does have 2+ pitting edema in her ankle,  this is likely related to dilated veins and low albumin.  She never received a heart monitor, I recommended proceed with a 2-week heart monitor, we will see her back in 6 weeks.  As for her lower extremity swelling, I would recommend leg elevation.  I would not recommend daily regimen of diuretic in the future given her cachectic state.  Past Medical History:  Diagnosis Date   Allergy    Anxiety    Arthritis    Cancer ([place])    Cavitating mass in left lower lung lobe    Complication of  anesthesia    woke up during procedure   COPD (chronic obstructive pulmonary disease) ([place])    not on home O2   Depression    Family history of adverse reaction to anesthesia    " my mother was always hard to wake up"   Full dentures    Headache    Neuromuscular disorder ([place])    Pneumonia    Thoracic spine fracture ([place]) 11/01/2017   T9   Wears glasses     Past Surgical History:  Procedure Laterality Date   ELBOW SURGERY     for  "Tennis Elbow"   hand surgery     IR KYPHO THORACIC WITH BONE BIOPSY  11/27/2017   IR KYPHO THORACIC WITH BONE BIOPSY  03/01/2018   IR KYPHO THORACIC WITH BONE BIOPSY  03/26/2018   IR RADIOLOGIST EVAL & MGMT  11/20/2017   IR RADIOLOGIST EVAL & MGMT  02/26/2018   IR RADIOLOGIST EVAL & MGMT  03/13/2018   MULTIPLE TOOTH EXTRACTIONS     TUBAL LIGATION     VIDEO BRONCHOSCOPY N/A 08/31/2017   Procedure: VIDEO BRONCHOSCOPY;  Surgeon: Melrose Nakayama, MD;  Location: [facility] OR;  Service: Thoracic;  Laterality: N/A;    Current Medications: Current Meds  Medication Sig   acetaminophen (TYLENOL 8 HOUR) 650 MG CR tablet Take 1 tablet (650 mg total) by mouth every 8 (eight) hours as needed for pain or fever.   albuterol (VENTOLIN HFA) 108 (90 Base) MCG/ACT inhaler Inhale 2 puffs into the lungs every 6 (six) hours as needed for wheezing or shortness of breath.   Fluticasone-Umeclidin-Vilant (TRELEGY ELLIPTA) 100-62.5-25 MCG/ACT AEPB Inhale 1 Inhalation into the lungs daily.   furosemide (LASIX) 20 MG tablet Take 1 tablet (20 mg total) by mouth daily as needed.   HYDROcodone-acetaminophen (NORCO/VICODIN) 5-325 MG tablet Take 1 tablet by mouth every 6 (six) hours as needed for moderate pain.   meclizine (ANTIVERT) 12.5 MG tablet Take 1 tablet (12.5 mg total) by mouth 2 (two) times daily as needed for dizziness (vertigo).   nicotine (NICODERM CQ - DOSED IN MG/24 HR) 7 mg/24hr patch Place 1 patch (7 mg total) onto the skin daily.   potassium chloride SA (KLOR-CON M) 20  MEQ tablet Take 1 daily with your lasix medication as directed As needed     Allergies:   Omnipaque [iohexol] and Penicillins   Social History   Socioeconomic History   Marital status: Divorced    Spouse name: Not on file   Number of children: Not on file   Years of education: Not on file   Highest education level: Not on file  Occupational History   Occupation: retired  Tobacco Use   Smoking status: Every Day    Packs/day: 1.00    Years: 50.00    Total pack years: 50.00    Types: Cigarettes   Smokeless tobacco: Never   Tobacco comments:  No smoking since June 12th 2023 07/19/2021 hfb  Vaping Use   Vaping Use: Never used  Substance and Sexual Activity   Alcohol use: No   Drug use: No   Sexual activity: Never  Other Topics Concern   Not on file  Social History Narrative   Not on file   Social Determinants of Health   Financial Resource Strain: Not on file  Food Insecurity: Not on file  Transportation Needs: Not on file  Physical Activity: Not on file  Stress: Not on file  Social Connections: Not on file     Family History: The patient's family history includes Diabetes in her mother.  ROS:   Please see the history of present illness.     All other systems reviewed and are negative.  EKGs/Labs/Other Studies Reviewed:    The following studies were reviewed today:  Echo 06/21/2021  1. Left ventricular ejection fraction, by estimation, is 55 to 60%. The  left ventricle has normal function. The left ventricle has no regional  wall motion abnormalities. Left ventricular diastolic parameters are  consistent with Grade II diastolic  dysfunction (pseudonormalization). Elevated left atrial pressure.   2. Right ventricular systolic function is mildly reduced. The right  ventricular size is mildly enlarged. There is moderately elevated  pulmonary artery systolic pressure. The estimated right ventricular  systolic pressure is 16.1 mmHg.   3. The mitral valve is  abnormal. Moderate to severe mitral valve  regurgitation. No evidence of mitral stenosis. Posterior directed MR jet.  Normal LA/LV size, low E wave velocity suggests likely moderate MR   4. Tricuspid valve regurgitation is mild to moderate.   5. The aortic valve is tricuspid. Aortic valve regurgitation is trivial.  No aortic stenosis is present.   6. The inferior vena cava is dilated in size with <50% respiratory  variability, suggesting right atrial pressure of 15 mmHg.   EKG:  EKG is not ordered today.    Recent Labs: 06/21/2021: B Natriuretic Peptide 1,691.3 07/05/2021: Magnesium 2.3 07/19/2021: ALT 7; BUN 23; Creatinine, Ser 0.97; Hemoglobin 12.6; Platelets 111.0 Repeated and verified X2.; Potassium 3.5; Sodium 144  Recent Lipid Panel No results found for: "CHOL", "TRIG", "HDL", "CHOLHDL", "VLDL", "LDLCALC", "LDLDIRECT"   Risk Assessment/Calculations:           Physical Exam:    VS:  BP 118/70   Pulse 92   Ht 5' 1"  (1.549 m)   Wt 72 lb 12.8 oz (33 kg)   SpO2 99%   BMI 13.76 kg/m     Wt Readings from Last 3 Encounters:  07/20/21 72 lb 12.8 oz (33 kg)  07/19/21 72 lb 9.6 oz (32.9 kg)  07/18/21 72 lb 9.6 oz (32.9 kg)     GEN: Cachectic, malnourished, on oxygen HEENT: Normal NECK: No JVD; No carotid bruits LYMPHATICS: No lymphadenopathy CARDIAC: RRR, no murmurs, rubs, gallops RESPIRATORY:  Clear to auscultation without rales, wheezing or rhonchi  ABDOMEN: Soft, non-tender, non-distended MUSCULOSKELETAL:  No edema; No deformity  SKIN: Warm and dry NEUROLOGIC:  Alert and oriented x 3 PSYCHIATRIC:  Normal affect   ASSESSMENT:    1. Syncope and collapse   2. Ankle edema   3. Protein-calorie malnutrition, severe   4. Elevated troponin   5. Chronic obstructive pulmonary disease, unspecified COPD type ([place])   6. Stage III squamous cell carcinoma of left lung ([place])   7. Mitral valve insufficiency, unspecified etiology    PLAN:    In order of problems listed  above:  Syncope: Recently had an episode of syncope at [facility].  She was seen by cardiology service who recommended 2-week heart monitor, she says she never received a heart monitor.  We will order another  Ankle edema: Likely related to venous stasis and low albumin level.  She has been given 2 days of diuretic after which time, she will change diuretic to as needed  Severe protein calorie malnutrition: She is cachectic.  Likely contributed to the low albumin level  Elevated troponin: She had borderline elevated troponin recently, however serial troponin was flat which argues against ACS.  Unless she has significant anginal symptom, I would not recommend ischemic work-up as she is a poor candidate for any invasive study  COPD: No acute exacerbation  History of lung cancer: Followed by oncology  Moderate to severe MR: Seen on recent echocardiogram on 06/21/2021, we will consider repeat echocardiogram in 6 months.  Either way, I do not think she will be a good candidate for any valve surgery in the future.           Medication Adjustments/Labs and Tests Ordered: Current medicines are reviewed at length with the patient today.  Concerns regarding medicines are outlined above.  Orders Placed This Encounter  Procedures   LONG TERM MONITOR-LIVE TELEMETRY (3-14 DAYS)   No orders of the defined types were placed in this encounter.   Patient Instructions  Medication Instructions:  Your physician recommends that you continue on your current medications as directed. Please refer to the Current Medication list given to you today.  *If you need a refill on your cardiac medications before your next appointment, please call your pharmacy*   Lab Work: NONE If you have labs (blood work) drawn today and your tests are completely normal, you will receive your results only by: [place] (if you have MyChart) OR A paper copy in the mail If you have any lab test that is abnormal or we need  to change your treatment, we will call you to review the results.   Testing/Procedures: ZIO AT Long term monitor-Live Telemetry  Your physician has requested you wear a ZIO patch monitor for 14 days.  This is a single patch monitor. [employer] supplies one patch monitor per enrollment. Additional  stickers are not available.  Please do not apply patch if you will be having a Nuclear Stress Test, Echocardiogram, Cardiac CT, MRI,  or Chest Xray during the period you would be wearing the monitor. The patch cannot be worn during  these tests. You cannot remove and re-apply the ZIO AT patch monitor.  Your ZIO patch monitor will be mailed 3 day USPS to your address on file. It may take 3-5 days to  receive your monitor after you have been enrolled.  Once you have received your monitor, please review the enclosed instructions. Your monitor has  already been registered assigning a specific monitor serial # to you.   Billing and Patient Assistance Program information  Theodore Demark has been supplied with any insurance information on record for billing. [employer] offers a sliding scale Patient Assistance Program for patients without insurance, or whose  insurance does not completely cover the cost of the ZIO patch monitor. You must apply for the  Patient Assistance Program to qualify for the discounted rate. To apply, call [employer] at [phone],  select option 4, select option 2 , ask to apply for the Patient Assistance Program, (you can request an  interpreter if needed). [employer] will ask your household income  and how many people are in your  household. [employer] will quote your out-of-pocket cost based on this information. They will also be able  to set up a 12 month interest free payment plan if needed.  Applying the monitor   Shave hair from upper left chest.  Hold the abrader disc by orange tab. Rub the abrader in 40 strokes over left upper chest as indicated in  your monitor instructions.  Clean  area with 4 enclosed alcohol pads. Use all pads to ensure the area is cleaned thoroughly. Let  dry.  Apply patch as indicated in monitor instructions. Patch will be placed under collarbone on left side of  chest with arrow pointing upward.  Rub patch adhesive wings for 2 minutes. Remove the white label marked "1". Remove the white label  marked "2". Rub patch adhesive wings for 2 additional minutes.  While looking in a mirror, press and release button in center of patch. A small green light will flash 3-4  times. This will be your only indicator that the monitor has been turned on.  Do not shower for the first 24 hours. You may shower after the first 24 hours.  Press the button if you feel a symptom. You will hear a small click. Record Date, Time and Symptom in  the Patient Log.   Starting the Gateway  In your kit there is a Hydrographic surveyor box the size of a cellphone. This is Airline pilot. It transmits all your  recorded data to [facility]. This box must always stay within 10 feet of you. Open the box and push the *  button. There will be a light that blinks orange and then green a few times. When the light stops  blinking, the Gateway is connected to the ZIO patch. Call [employer] at [phone] to confirm your monitor is transmitting.  Returning your monitor  Remove your patch and place it inside the [place]. In the lower half of the Gateway there is a white  bag with prepaid postage on it. Place Gateway in bag and seal. Mail package back to [place] as soon as  possible. Your physician should have your final report approximately 7 days after you have mailed back  your monitor. Call [place] at [phone] if you have questions regarding your ZIO AT  patch monitor. Call them immediately if you see an orange light blinking on your monitor.  If your monitor falls off in less than 4 days, contact our Monitor department at [phone]. If your  monitor becomes loose  or falls off after 4 days call [employer] at [phone] for suggestions on  securing your monitor    Follow-Up: At [facility], you and your health needs are our priority.  As part of our continuing mission to provide you with exceptional heart care, we have created designated Provider Care Teams.  These Care Teams include your primary Cardiologist (physician) and Advanced Practice Providers (APPs -  Physician Assistants and Nurse Practitioners) who all work together to provide you with the care you need, when you need it.  We recommend signing up for the patient portal called "MyChart".  Sign up information is provided on this After Visit Summary.  MyChart is used to connect with patients for Virtual Visits (Telemedicine).  Patients are able to view lab/test results, encounter notes, upcoming appointments, etc.  Non-urgent messages can be sent to your provider as well.   To learn more about what you can do with MyChart, go to  [email or web address].    Your next appointment:   6 week(s)  The format for your next appointment:   In Person  Provider:   Almyra Deforest, PA-C on a day that Quay Burow, MD is in office.   Hilbert Corrigan, [place]  07/22/2021 11:58 PM    [place]

## 2021-07-20 NOTE — Progress Notes (Unsigned)
Enrolled patient for a 14 day Zio AT monitor to be mailed to patients home  First monitor fell off immediately d/t dead skin cells. REDO monitor mailed to patient and applied in office on 07/28/21. [id number]  Dr Gwenlyn Found to read

## 2021-07-20 NOTE — Progress Notes (Signed)
Called and spoke with patient, advised of results/recommendations per Tammy Parrett NP.  She verbalized understanding.  Nothing further needed.

## 2021-07-20 NOTE — Patient Instructions (Signed)
Medication Instructions:  Your physician recommends that you continue on your current medications as directed. Please refer to the Current Medication list given to you today.  *If you need a refill on your cardiac medications before your next appointment, please call your pharmacy*   Lab Work: NONE If you have labs (blood work) drawn today and your tests are completely normal, you will receive your results only by: [place] (if you have MyChart) OR A paper copy in the mail If you have any lab test that is abnormal or we need to change your treatment, we will call you to review the results.   Testing/Procedures: ZIO AT Long term monitor-Live Telemetry  Your physician has requested you wear a ZIO patch monitor for 14 days.  This is a single patch monitor. [employer] supplies one patch monitor per enrollment. Additional  stickers are not available.  Please do not apply patch if you will be having a Nuclear Stress Test, Echocardiogram, Cardiac CT, MRI,  or Chest Xray during the period you would be wearing the monitor. The patch cannot be worn during  these tests. You cannot remove and re-apply the ZIO AT patch monitor.  Your ZIO patch monitor will be mailed 3 day USPS to your address on file. It may take 3-5 days to  receive your monitor after you have been enrolled.  Once you have received your monitor, please review the enclosed instructions. Your monitor has  already been registered assigning a specific monitor serial # to you.   Billing and Patient Assistance Program information  Sherry [name] has been supplied with any insurance information on record for billing. [employer] offers a sliding scale Patient Assistance Program for patients without insurance, or whose  insurance does not completely cover the cost of the ZIO patch monitor. You must apply for the  Patient Assistance Program to qualify for the discounted rate. To apply, call [employer] at [phone],  select option 4, select  option 2 , ask to apply for the Patient Assistance Program, (you can request an  interpreter if needed). [employer] will ask your household income and how many people are in your  household. [employer] will quote your out-of-pocket cost based on this information. They will also be able  to set up a 12 month interest free payment plan if needed.  Applying the monitor   Shave hair from upper left chest.  Hold the abrader disc by orange tab. Rub the abrader in 40 strokes over left upper chest as indicated in  your monitor instructions.  Clean area with 4 enclosed alcohol pads. Use all pads to ensure the area is cleaned thoroughly. Let  dry.  Apply patch as indicated in monitor instructions. Patch will be placed under collarbone on left side of  chest with arrow pointing upward.  Rub patch adhesive wings for 2 minutes. Remove the white label marked "1". Remove the white label  marked "2". Rub patch adhesive wings for 2 additional minutes.  While looking in a mirror, press and release button in center of patch. A small green light will flash 3-4  times. This will be your only indicator that the monitor has been turned on.  Do not shower for the first 24 hours. You may shower after the first 24 hours.  Press the button if you feel a symptom. You will hear a small click. Record Date, Time and Symptom in  the Patient Log.   Starting the Gateway  In your kit there is a Optometrist  the size of a cellphone. This is Airline pilot. It transmits all your  recorded data to [facility]. This box must always stay within 10 feet of you. Open the box and push the *  button. There will be a light that blinks orange and then green a few times. When the light stops  blinking, the Gateway is connected to the ZIO patch. Call [employer] at [phone] to confirm your monitor is transmitting.  Returning your monitor  Remove your patch and place it inside the [place]. In the lower half of the Gateway there is a white   bag with prepaid postage on it. Place Gateway in bag and seal. Mail package back to [place] as soon as  possible. Your physician should have your final report approximately 7 days after you have mailed back  your monitor. Call [place] at [phone] if you have questions regarding your ZIO AT  patch monitor. Call them immediately if you see an orange light blinking on your monitor.  If your monitor falls off in less than 4 days, contact our Monitor department at [phone]. If your  monitor becomes loose or falls off after 4 days call [employer] at [phone] for suggestions on  securing your monitor    Follow-Up: At [facility], you and your health needs are our priority.  As part of our continuing mission to provide you with exceptional heart care, we have created designated Provider Care Teams.  These Care Teams include your primary Cardiologist (physician) and Advanced Practice Providers (APPs -  Physician Assistants and Nurse Practitioners) who all work together to provide you with the care you need, when you need it.  We recommend signing up for the patient portal called "MyChart".  Sign up information is provided on this After Visit Summary.  MyChart is used to connect with patients for Virtual Visits (Telemedicine).  Patients are able to view lab/test results, encounter notes, upcoming appointments, etc.  Non-urgent messages can be sent to your provider as well.   To learn more about what you can do with MyChart, go to [email or web address].    Your next appointment:   6 week(s)  The format for your next appointment:   In Person  Provider:   Almyra Deforest, PA-C on a day that Sherry Burow, MD is in office.

## 2021-07-22 ENCOUNTER — Encounter: Payer: Self-pay | Admitting: Physician Assistant

## 2021-07-28 DIAGNOSIS — R55 Syncope and collapse: Secondary | ICD-10-CM

## 2021-08-01 ENCOUNTER — Other Ambulatory Visit: Payer: Self-pay

## 2021-08-09 ENCOUNTER — Other Ambulatory Visit: Payer: Self-pay

## 2021-08-11 ENCOUNTER — Inpatient Hospital Stay (HOSPITAL_COMMUNITY)
Admission: EM | Admit: 2021-08-11 | Discharge: 2021-08-19 | DRG: 291 | Disposition: A | Discharge status: Home Health | Payer: Medicare Other | Attending: Internal Medicine | Admitting: Internal Medicine

## 2021-08-11 ENCOUNTER — Emergency Department (HOSPITAL_COMMUNITY): Payer: Medicare Other

## 2021-08-11 ENCOUNTER — Other Ambulatory Visit: Payer: Self-pay

## 2021-08-11 DIAGNOSIS — C3432 Malignant neoplasm of lower lobe, left bronchus or lung: Secondary | ICD-10-CM | POA: Diagnosis present

## 2021-08-11 DIAGNOSIS — I272 Pulmonary hypertension, unspecified: Secondary | ICD-10-CM | POA: Diagnosis present

## 2021-08-11 DIAGNOSIS — Z7189 Other specified counseling: Secondary | ICD-10-CM | POA: Diagnosis not present

## 2021-08-11 DIAGNOSIS — Z681 Body mass index (BMI) 19 or less, adult: Secondary | ICD-10-CM | POA: Diagnosis not present

## 2021-08-11 DIAGNOSIS — N1831 Chronic kidney disease, stage 3a: Secondary | ICD-10-CM | POA: Diagnosis present

## 2021-08-11 DIAGNOSIS — I50813 Acute on chronic right heart failure: Principal | ICD-10-CM

## 2021-08-11 DIAGNOSIS — L03116 Cellulitis of left lower limb: Secondary | ICD-10-CM | POA: Diagnosis not present

## 2021-08-11 DIAGNOSIS — R54 Age-related physical debility: Secondary | ICD-10-CM | POA: Diagnosis present

## 2021-08-11 DIAGNOSIS — Z881 Allergy status to other antibiotic agents status: Secondary | ICD-10-CM

## 2021-08-11 DIAGNOSIS — K5900 Constipation, unspecified: Secondary | ICD-10-CM | POA: Diagnosis not present

## 2021-08-11 DIAGNOSIS — J449 Chronic obstructive pulmonary disease, unspecified: Secondary | ICD-10-CM | POA: Diagnosis present

## 2021-08-11 DIAGNOSIS — F32A Depression, unspecified: Secondary | ICD-10-CM | POA: Diagnosis present

## 2021-08-11 DIAGNOSIS — E43 Unspecified severe protein-calorie malnutrition: Secondary | ICD-10-CM | POA: Diagnosis present

## 2021-08-11 DIAGNOSIS — Z91041 Radiographic dye allergy status: Secondary | ICD-10-CM

## 2021-08-11 DIAGNOSIS — F419 Anxiety disorder, unspecified: Secondary | ICD-10-CM | POA: Diagnosis present

## 2021-08-11 DIAGNOSIS — L03115 Cellulitis of right lower limb: Secondary | ICD-10-CM | POA: Diagnosis present

## 2021-08-11 DIAGNOSIS — Z9981 Dependence on supplemental oxygen: Secondary | ICD-10-CM | POA: Diagnosis not present

## 2021-08-11 DIAGNOSIS — Z66 Do not resuscitate: Secondary | ICD-10-CM | POA: Diagnosis not present

## 2021-08-11 DIAGNOSIS — D630 Anemia in neoplastic disease: Secondary | ICD-10-CM | POA: Diagnosis present

## 2021-08-11 DIAGNOSIS — I5033 Acute on chronic diastolic (congestive) heart failure: Secondary | ICD-10-CM | POA: Diagnosis present

## 2021-08-11 DIAGNOSIS — R64 Cachexia: Secondary | ICD-10-CM | POA: Diagnosis present

## 2021-08-11 DIAGNOSIS — I714 Abdominal aortic aneurysm, without rupture, unspecified: Secondary | ICD-10-CM | POA: Diagnosis present

## 2021-08-11 DIAGNOSIS — I340 Nonrheumatic mitral (valve) insufficiency: Secondary | ICD-10-CM | POA: Diagnosis present

## 2021-08-11 DIAGNOSIS — R339 Retention of urine, unspecified: Secondary | ICD-10-CM | POA: Diagnosis not present

## 2021-08-11 DIAGNOSIS — E46 Unspecified protein-calorie malnutrition: Secondary | ICD-10-CM | POA: Diagnosis present

## 2021-08-11 DIAGNOSIS — C3492 Malignant neoplasm of unspecified part of left bronchus or lung: Secondary | ICD-10-CM | POA: Diagnosis not present

## 2021-08-11 DIAGNOSIS — E8809 Other disorders of plasma-protein metabolism, not elsewhere classified: Secondary | ICD-10-CM | POA: Diagnosis present

## 2021-08-11 DIAGNOSIS — Z880 Allergy status to penicillin: Secondary | ICD-10-CM

## 2021-08-11 DIAGNOSIS — Z79899 Other long term (current) drug therapy: Secondary | ICD-10-CM | POA: Diagnosis not present

## 2021-08-11 DIAGNOSIS — E876 Hypokalemia: Secondary | ICD-10-CM | POA: Diagnosis present

## 2021-08-11 DIAGNOSIS — Z923 Personal history of irradiation: Secondary | ICD-10-CM

## 2021-08-11 DIAGNOSIS — Z9221 Personal history of antineoplastic chemotherapy: Secondary | ICD-10-CM

## 2021-08-11 DIAGNOSIS — J9611 Chronic respiratory failure with hypoxia: Secondary | ICD-10-CM | POA: Diagnosis present

## 2021-08-11 DIAGNOSIS — Z7951 Long term (current) use of inhaled steroids: Secondary | ICD-10-CM | POA: Diagnosis not present

## 2021-08-11 DIAGNOSIS — E440 Moderate protein-calorie malnutrition: Secondary | ICD-10-CM | POA: Diagnosis not present

## 2021-08-11 LAB — CBC WITH DIFFERENTIAL/PLATELET
Abs Immature Granulocytes: 0.04 10*3/uL (ref 0.00–0.07)
Basophils Absolute: 0 10*3/uL (ref 0.0–0.1)
Basophils Relative: 0 %
Eosinophils Absolute: 0 10*3/uL (ref 0.0–0.5)
Eosinophils Relative: 0 %
HCT: 39.4 % (ref 36.0–46.0)
Hemoglobin: 11.5 g/dL — ABNORMAL LOW (ref 12.0–15.0)
Immature Granulocytes: 1 %
Lymphocytes Relative: 12 %
Lymphs Abs: 1 10*3/uL (ref 0.7–4.0)
MCH: 28.3 pg (ref 26.0–34.0)
MCHC: 29.2 g/dL — ABNORMAL LOW (ref 30.0–36.0)
MCV: 96.8 fL (ref 80.0–100.0)
Monocytes Absolute: 0.3 10*3/uL (ref 0.1–1.0)
Monocytes Relative: 4 %
Neutro Abs: 6.9 10*3/uL (ref 1.7–7.7)
Neutrophils Relative %: 83 %
Platelets: 176 10*3/uL (ref 150–400)
RBC: 4.07 MIL/uL (ref 3.87–5.11)
RDW: 18.6 % — ABNORMAL HIGH (ref 11.5–15.5)
WBC: 8.3 10*3/uL (ref 4.0–10.5)
nRBC: 0 % (ref 0.0–0.2)

## 2021-08-11 LAB — I-STAT VENOUS BLOOD GAS, ED
Acid-Base Excess: 10 mmol/L — ABNORMAL HIGH (ref 0.0–2.0)
Bicarbonate: 33.8 mmol/L — ABNORMAL HIGH (ref 20.0–28.0)
Calcium, Ion: 0.9 mmol/L — ABNORMAL LOW (ref 1.15–1.40)
HCT: 36 % (ref 36.0–46.0)
Hemoglobin: 12.2 g/dL (ref 12.0–15.0)
O2 Saturation: 99 %
Potassium: 3.5 mmol/L (ref 3.5–5.1)
Sodium: 139 mmol/L (ref 135–145)
TCO2: 35 mmol/L — ABNORMAL HIGH (ref 22–32)
pCO2, Ven: 42.4 mmHg — ABNORMAL LOW (ref 44–60)
pH, Ven: 7.509 — ABNORMAL HIGH (ref 7.25–7.43)
pO2, Ven: 136 mmHg — ABNORMAL HIGH (ref 32–45)

## 2021-08-11 LAB — COMPREHENSIVE METABOLIC PANEL
ALT: 11 U/L (ref 0–44)
AST: 19 U/L (ref 15–41)
Albumin: 2.9 g/dL — ABNORMAL LOW (ref 3.5–5.0)
Alkaline Phosphatase: 72 U/L (ref 38–126)
Anion gap: 7 (ref 5–15)
BUN: 21 mg/dL (ref 8–23)
CO2: 37 mmol/L — ABNORMAL HIGH (ref 22–32)
Calcium: 8.4 mg/dL — ABNORMAL LOW (ref 8.9–10.3)
Chloride: 99 mmol/L (ref 98–111)
Creatinine, Ser: 1.03 mg/dL — ABNORMAL HIGH (ref 0.44–1.00)
GFR, Estimated: 56 mL/min — ABNORMAL LOW (ref >60)
Glucose, Bld: 96 mg/dL (ref 70–99)
Potassium: 3.7 mmol/L (ref 3.5–5.1)
Sodium: 143 mmol/L (ref 135–145)
Total Bilirubin: 0.5 mg/dL (ref 0.3–1.2)
Total Protein: 6 g/dL — ABNORMAL LOW (ref 6.5–8.1)

## 2021-08-11 LAB — I-STAT CHEM 8, ED
BUN: 26 mg/dL — ABNORMAL HIGH (ref 8–23)
Calcium, Ion: 0.84 mmol/L — CL (ref 1.15–1.40)
Chloride: 103 mmol/L (ref 98–111)
Creatinine, Ser: 0.8 mg/dL (ref 0.44–1.00)
Glucose, Bld: 73 mg/dL (ref 70–99)
HCT: 37 % (ref 36.0–46.0)
Hemoglobin: 12.6 g/dL (ref 12.0–15.0)
Potassium: 4 mmol/L (ref 3.5–5.1)
Sodium: 140 mmol/L (ref 135–145)
TCO2: 34 mmol/L — ABNORMAL HIGH (ref 22–32)

## 2021-08-11 LAB — TROPONIN I (HIGH SENSITIVITY)
Troponin I (High Sensitivity): 27 ng/L — ABNORMAL HIGH (ref <18)
Troponin I (High Sensitivity): 30 ng/L — ABNORMAL HIGH (ref <18)

## 2021-08-11 LAB — BRAIN NATRIURETIC PEPTIDE: B Natriuretic Peptide: 1572.8 pg/mL — ABNORMAL HIGH (ref 0.0–100.0)

## 2021-08-11 LAB — MAGNESIUM: Magnesium: 2 mg/dL (ref 1.7–2.4)

## 2021-08-11 MED ORDER — UMECLIDINIUM BROMIDE 62.5 MCG/ACT IN AEPB
1.0000 | INHALATION_SPRAY | Freq: Every day | RESPIRATORY_TRACT | Status: DC
  Administered 2021-08-12 – 2021-08-19 (×8): 1 via RESPIRATORY_TRACT
  Filled 2021-08-11 (×3): qty 7

## 2021-08-11 MED ORDER — ONDANSETRON HCL 4 MG/2ML IJ SOLN: 4.0000 mg | Freq: Four times a day (QID) | INTRAMUSCULAR | Status: DC | PRN

## 2021-08-11 MED ORDER — ACETAMINOPHEN 650 MG RE SUPP: 650.0000 mg | Freq: Four times a day (QID) | RECTAL | Status: DC | PRN

## 2021-08-11 MED ORDER — SENNOSIDES-DOCUSATE SODIUM 8.6-50 MG PO TABS: 1.0000 | ORAL_TABLET | Freq: Every evening | ORAL | Status: DC | PRN

## 2021-08-11 MED ORDER — FUROSEMIDE 10 MG/ML IJ SOLN
40.0000 mg | Freq: Once | INTRAMUSCULAR | Status: AC
  Administered 2021-08-11: 40 mg via INTRAVENOUS
  Filled 2021-08-11: qty 4

## 2021-08-11 MED ORDER — SODIUM CHLORIDE 0.9% FLUSH
3.0000 mL | Freq: Two times a day (BID) | INTRAVENOUS | Status: DC
  Administered 2021-08-12 – 2021-08-19 (×15): 3 mL via INTRAVENOUS

## 2021-08-11 MED ORDER — POTASSIUM CHLORIDE CRYS ER 20 MEQ PO TBCR
20.0000 meq | EXTENDED_RELEASE_TABLET | Freq: Every day | ORAL | Status: DC
  Administered 2021-08-11 – 2021-08-12 (×2): 20 meq via ORAL
  Filled 2021-08-11 (×3): qty 1

## 2021-08-11 MED ORDER — ONDANSETRON HCL 4 MG PO TABS: 4.0000 mg | ORAL_TABLET | Freq: Four times a day (QID) | ORAL | Status: DC | PRN

## 2021-08-11 MED ORDER — ENOXAPARIN SODIUM 30 MG/0.3ML IJ SOSY
30.0000 mg | PREFILLED_SYRINGE | INTRAMUSCULAR | Status: DC
  Administered 2021-08-11 – 2021-08-18 (×8): 30 mg via SUBCUTANEOUS
  Filled 2021-08-11 (×8): qty 0.3

## 2021-08-11 MED ORDER — ALBUTEROL SULFATE (2.5 MG/3ML) 0.083% IN NEBU: 2.5000 mg | INHALATION_SOLUTION | Freq: Four times a day (QID) | RESPIRATORY_TRACT | Status: DC | PRN

## 2021-08-11 MED ORDER — HYDROCODONE-ACETAMINOPHEN 5-325 MG PO TABS
1.0000 | ORAL_TABLET | Freq: Four times a day (QID) | ORAL | Status: DC | PRN
  Administered 2021-08-11: 1 via ORAL
  Filled 2021-08-11: qty 1

## 2021-08-11 MED ORDER — ACETAMINOPHEN 325 MG PO TABS
650.0000 mg | ORAL_TABLET | Freq: Four times a day (QID) | ORAL | Status: DC | PRN
  Administered 2021-08-14: 650 mg via ORAL
  Filled 2021-08-11: qty 2

## 2021-08-11 MED ORDER — FLUTICASONE FUROATE-VILANTEROL 100-25 MCG/ACT IN AEPB
1.0000 | INHALATION_SPRAY | Freq: Every day | RESPIRATORY_TRACT | Status: DC
  Administered 2021-08-12 – 2021-08-19 (×8): 1 via RESPIRATORY_TRACT
  Filled 2021-08-11: qty 28

## 2021-08-11 MED ORDER — FUROSEMIDE 10 MG/ML IJ SOLN
40.0000 mg | Freq: Two times a day (BID) | INTRAMUSCULAR | Status: DC
  Administered 2021-08-12 – 2021-08-13 (×3): 40 mg via INTRAVENOUS
  Filled 2021-08-11 (×3): qty 4

## 2021-08-11 NOTE — H&P (Signed)
Initial vitals showed History and Physical    Sherry [name] CHY:[id number] DOB: 1945-07-17 DOA: 08/11/2021  PCP: [name], NP  Patient coming from: PCP office  I have personally briefly reviewed patient's old medical records in [place]  Chief Complaint: Lower extremity edema  HPI: Sherry [name] is a 76 y.o. female with medical history significant for COPD, chronic diastolic CHF (EF 87-86%), moderate-severe mitral regurgitation, stage III left-sided non-small cell lung cancer (s/p concurrent chemoradiation followed by immunotherapy, now under observation), chronic respiratory failure with hypoxia on 2 L O2 via [place], CKD stage IIIa, depression/anxiety, malnutrition, and former tobacco use who presented to the ED for evaluation of progressive lower extremity edema.  Patient reports worsening lower extremity edema over the last month.  She has had occasional cough, intermittent shortness of breath.  Denies chest pain.  Over the last week she has noticed clear weeping fluid coming from her left lower leg with overlying erythema.  She denies any injury, abrasion, scratching of the legs.  Previously had courses of Lasix prn at home but not currently taking any diuretics.  ED Course  Labs/Imaging on admission: I have personally reviewed following labs and imaging studies.  Initial vitals showed BP 126/114, pulse 101, RR 16, temp 98.1 F, SPO2 100% on 2 L O2 via [place].  Labs show WBC 8.3, hemoglobin 11.5, platelets 176,000, BNP 1572.8, troponin 27.  I-STAT Chem-8 shows sodium 140, potassium 4.0, BUN 26, creatinine 0.80, serum glucose 73.  Formal chest x-ray shows interval worsening of pulmonary vascular congestion and pulmonary edema.  Moderate bilateral pleural effusions with interval increase noted.  Left tibia/fibula x-ray negative for evidence of fracture or other focal bone lesions.  Patient was given IV Lasix 40 mg and the hospitalist service was consulted to admit for further  evaluation and management.  Review of Systems: All systems reviewed and are negative except as documented in history of present illness above.   Past Medical History:  Diagnosis Date   Allergy    Anxiety    Arthritis    Cancer ([place])    Cavitating mass in left lower lung lobe    Complication of anesthesia    woke up during procedure   COPD (chronic obstructive pulmonary disease) ([place])    not on home O2   Depression    Family history of adverse reaction to anesthesia    " my mother was always hard to wake up"   Full dentures    Headache    Neuromuscular disorder ([place])    Pneumonia    Thoracic spine fracture ([place]) 11/01/2017   T9   Wears glasses     Past Surgical History:  Procedure Laterality Date   ELBOW SURGERY     for  "Tennis Elbow"   hand surgery     IR KYPHO THORACIC WITH BONE BIOPSY  11/27/2017   IR KYPHO THORACIC WITH BONE BIOPSY  03/01/2018   IR KYPHO THORACIC WITH BONE BIOPSY  03/26/2018   IR RADIOLOGIST EVAL & MGMT  11/20/2017   IR RADIOLOGIST EVAL & MGMT  02/26/2018   IR RADIOLOGIST EVAL & MGMT  03/13/2018   MULTIPLE TOOTH EXTRACTIONS     TUBAL LIGATION     VIDEO BRONCHOSCOPY N/A 08/31/2017   Procedure: VIDEO BRONCHOSCOPY;  Surgeon: Melrose Nakayama, MD;  Location: [facility];  Service: Thoracic;  Laterality: N/A;    Social History:  reports that she has been smoking cigarettes. She has a 50.00 pack-year smoking history.  She has never used smokeless tobacco. She reports that she does not drink alcohol and does not use drugs.  Allergies  Allergen Reactions   Omnipaque [Iohexol] Hives and Itching    Patient had a late reaction of itching and hives after scan will need premeds   Penicillins Hives    Has patient had a PCN reaction causing immediate rash, facial/tongue/throat swelling, SOB or lightheadedness with hypotension: no Has patient had a PCN reaction causing severe rash involving mucus membranes or skin necrosis: No Has patient had a PCN reaction that  required hospitalization: No Has patient had a PCN reaction occurring within the last 10 years: No If all of the above answers are "NO", then may proceed with Cephalosporin use.     Family History  Problem Relation Age of Onset   Diabetes Mother      Prior to Admission medications   Medication Sig Start Date End Date Taking? Authorizing Provider  acetaminophen (TYLENOL 8 HOUR) 650 MG CR tablet Take 1 tablet (650 mg total) by mouth every 8 (eight) hours as needed for pain or fever. 10/30/17   Varney Biles, MD  albuterol (VENTOLIN HFA) 108 (90 Base) MCG/ACT inhaler Inhale 2 puffs into the lungs every 6 (six) hours as needed for wheezing or shortness of breath. 06/23/21   Dessa Phi, DO  Fluticasone-Umeclidin-Vilant (TRELEGY ELLIPTA) 100-62.5-25 MCG/ACT AEPB Inhale 1 Inhalation into the lungs daily. 07/19/21   Parrett, Fonnie Mu, NP  furosemide (LASIX) 20 MG tablet Take 1 tablet (20 mg total) by mouth daily as needed. 07/19/21   Parrett, Fonnie Mu, NP  HYDROcodone-acetaminophen (NORCO/VICODIN) 5-325 MG tablet Take 1 tablet by mouth every 6 (six) hours as needed for moderate pain. 08/13/18   [provider]  meclizine (ANTIVERT) 12.5 MG tablet Take 1 tablet (12.5 mg total) by mouth 2 (two) times daily as needed for dizziness (vertigo). 06/23/21   Dessa Phi, DO  nicotine (NICODERM CQ - DOSED IN MG/24 HR) 7 mg/24hr patch Place 1 patch (7 mg total) onto the skin daily. 06/23/21   Dessa Phi, DO  potassium chloride SA (KLOR-CON M) 20 MEQ tablet Take 1 daily with your lasix medication as directed As needed 07/19/21   Melvenia Needles, NP    Physical Exam: Vitals:   08/11/21 1629 08/11/21 1745 08/11/21 1845 08/11/21 1847  BP:  121/80 132/83   Pulse:  94 (!) 104 (!) 102  Resp:  (!) 22 (!) 27 (!) 24  Temp:      TempSrc:      SpO2: 100% 98% 98% 100%  Weight:      Height:       Constitutional: Cachectic woman resting in bed, NAD, calm, comfortable Eyes: EOMI, lids and conjunctivae  normal ENMT: Mucous membranes are moist. Posterior pharynx clear of any exudate or lesions.Normal dentition.  Neck: normal, supple, no masses. Respiratory: Diminished breath sound bilateral lung bases, clear upper lung fields. Normal respiratory effort while on 2 L O2 via [place].  No accessory muscle use.  Cardiovascular: Regular rate and rhythm, no murmurs / rubs / gallops.  Trace bilateral lower extremity edema. 2+ pedal pulses. Abdomen: no tenderness, no masses palpated.  Musculoskeletal: Muscle wasting throughout.  No clubbing / cyanosis. No joint deformity upper and lower extremities. Good ROM, no contractures. Normal muscle tone.  Skin: erythema left lower extremity from above the ankle to mid calf as pictured below, some clear weeping drainage Neurologic: Sensation intact. Strength equal bilaterally. Psychiatric: Alert and oriented x 3.  EKG: Personally reviewed. Sinus rhythm, rate 95, no acute ischemic changes.  Similar to prior.  Assessment/Plan Principal Problem:   Acute on chronic diastolic CHF (congestive heart failure) (HCC) Active Problems:   Chronic respiratory failure with hypoxia (HCC)   COPD (chronic obstructive pulmonary disease) (HCC)   Malnutrition (HCC)   Stage III squamous cell carcinoma of left lung (HCC)   Chronic kidney disease, stage 3a (HCC)   Sherry [name] is a 76 y.o. female with medical history significant for COPD, chronic diastolic CHF (EF 45-36%), moderate-severe mitral regurgitation, stage III left-sided non-small cell lung cancer (s/p concurrent chemoradiation followed by immunotherapy, now under observation), chronic respiratory failure with hypoxia on 2 L O2 via [place], CKD stage IIIa, depression/anxiety, malnutrition, and former tobacco use who is admitted with acute on chronic diastolic CHF.  Assessment and Plan: * Acute on chronic diastolic CHF (congestive heart failure) ([place]) Presenting with progressive peripheral edema, elevated BNP 1572.8, worsening  pulmonary edema and bilateral pleural effusion seen on CXR.  TTE 06/21/2021 showed EF 55-60% with G2DD. -Continue IV Lasix 40 mg BID -Monitor strict I/O's, daily weights -Supplement potassium  COPD (chronic obstructive pulmonary disease) (HCC) Chronic respiratory failure with hypoxia Stable without acute exacerbation. -Continue Trelegy and albuterol as needed -Continue home 2 L O2 via [place]  Chronic kidney disease, stage 3a ([place]) Monitor renal function with diuresis.  Stage III squamous cell carcinoma of left lung [facility]) Following with oncology, Dr. Lorna Few.  Completed prior treatment with chemoradiation and immunotherapy.  Currently under observation.  Malnutrition ([place]) Estimated body mass index is 13.75 kg/m as calculated from the following:   Height as of this encounter: 5\' 1"  (1.549 m).   Weight as of this encounter: 33 kg.  -Consult nutrition  DVT prophylaxis: enoxaparin (LOVENOX) injection 30 mg Start: 08/11/21 2045 Code Status: Previously DNR, patient requests Full Code status this admission. Family Communication: Discussed with patient, she has discussed with family Disposition Plan: From home, dispo pending clinical progress Consults called: None Severity of Illness: The appropriate patient status for this patient is INPATIENT. Inpatient status is judged to be reasonable and necessary in order to provide the required intensity of service to ensure the patient's safety. The patient's presenting symptoms, physical exam findings, and initial radiographic and laboratory data in the context of their chronic comorbidities is felt to place them at high risk for further clinical deterioration. Furthermore, it is not anticipated that the patient will be medically stable for discharge from the hospital within 2 midnights of admission.   * I certify that at the point of admission it is my clinical judgment that the patient will require inpatient hospital care spanning beyond 2  midnights from the point of admission due to high intensity of service, high risk for further deterioration and high frequency of surveillance required.Zada Finders MD [facility]  If 7PM-7AM, please contact night-coverage [email or web address]  08/11/2021, 8:42 PM

## 2021-08-11 NOTE — Assessment & Plan Note (Addendum)
Renal function has remained stable, at the time of her discharge serum cr is 0,91 with K at 3,6 and serum bicarbonate at 36.  Plan to continue diuresis with torsemide and spironolactone and follow up renal function and electrolytes as outpatient.   Acute urinary retention. Patient had multiple in and out bladder catheterizations due to urinary retention.  Foley catheter was placed, plan to follow up urinary voiding trial as outpatient in 7 to 10 days.

## 2021-08-11 NOTE — Hospital Course (Addendum)
Mrs. Sherry [name] was admitted to the hospital with the working diagnosis of heart failure decompensation.   76 y.o. female with medical history significant for COPD, chronic diastolic CHF (EF 46-28%), moderate-severe mitral regurgitation, stage III left-sided non-small cell lung cancer (s/p concurrent chemoradiation followed by immunotherapy, now under observation), chronic respiratory failure with hypoxia on 2 L O2 via [place], CKD stage IIIa, depression/anxiety, malnutrition, and former tobacco use who presented with progressive lower extremity edema. Reported 4 weeks of worsening lower extremity edema, and intermittent dyspnea. On her initial physical examination her blood pressure was 126/114, HR 101, RR 16, and 02 saturation 100% on 2 L/min. Lungs with decreased breath sounds bilaterally, heart with S1 and S2 present and rhythmic, abdomen not distended, positive lower extremity edema. Left lower extremity erythematous rash on her right lower extremity.   Na 143, K 3,7 Cl 99, bicarbonate 37, glucose 96 bun 21 cr 1,0  High sensitive troponin 27 and 30  Wbc 7,4 hgb 11,4 plt 207  Urine analysis with SG 1,006, negative leukocytes.   Chest radiograph with hyperinflation, bilateral interstitial infiltrates, predominant at bases, with bilateral loculated pleural effusions.   EKG 95 bpm, normal axis, normal intervals, sinus rhythm, with no significant ST segment or T wave changes.   Patient was placed on IV furosemide for diuresis.  Transitioned to oral toresemide Patient had declined SNF and decided to go home with home health services.  She was noted to have urinary retention and foley catheter was placed. Patient will be discharged with foley catheter and instructions for voiding trial as outpatient.  Continue oral diuretic therapy.

## 2021-08-11 NOTE — ED Notes (Signed)
Patient transported to X-ray 

## 2021-08-11 NOTE — ED Provider Notes (Signed)
[facility] EMERGENCY DEPARTMENT Provider Note   CSN: [id number] Arrival date & time: 08/11/21  1621     History  Chief Complaint  Patient presents with   Leg Swelling    Patient arrives via EMS from doctor's office due to bilateral swelling to legs. Per patient left lower leg is significantly more painful and swollen than the right. Patient states that her legs have been like this x2 weeks. Sensations and pulses intact. Significant edema noted to legs and feet with weeping wounds.     Sherry [name] is a 76 y.o. female here with bilateral leg swelling.  Patient has history of lung cancer, here presenting with questionable low oxygen saturation and also leg swelling.  Patient was seen here about a month ago for leg swelling.  Patient had elevated troponin of 100 and was thought to be fluid overloaded so was sent home with Lasix.  Patient states that she finished her course of Lasix but her legs have become more swollen over the last 2 to 3 weeks.  She states that it is weeping now.  She states that she has some clear fluid coming out of her left calf. Patient denies any shortness of breath and went to the doctor's office and they weren't able to get good oxygen saturation on pulse ox.   The history is provided by the patient.       Home Medications Prior to Admission medications   Medication Sig Start Date End Date Taking? Authorizing Provider  acetaminophen (TYLENOL 8 HOUR) 650 MG CR tablet Take 1 tablet (650 mg total) by mouth every 8 (eight) hours as needed for pain or fever. 10/30/17   Varney Biles, MD  albuterol (VENTOLIN HFA) 108 (90 Base) MCG/ACT inhaler Inhale 2 puffs into the lungs every 6 (six) hours as needed for wheezing or shortness of breath. 06/23/21   Dessa Phi, DO  Fluticasone-Umeclidin-Vilant (TRELEGY ELLIPTA) 100-62.5-25 MCG/ACT AEPB Inhale 1 Inhalation into the lungs daily. 07/19/21   Parrett, Fonnie Mu, NP  furosemide (LASIX) 20 MG tablet Take 1  tablet (20 mg total) by mouth daily as needed. 07/19/21   Parrett, Fonnie Mu, NP  HYDROcodone-acetaminophen (NORCO/VICODIN) 5-325 MG tablet Take 1 tablet by mouth every 6 (six) hours as needed for moderate pain. 08/13/18   [provider]  meclizine (ANTIVERT) 12.5 MG tablet Take 1 tablet (12.5 mg total) by mouth 2 (two) times daily as needed for dizziness (vertigo). 06/23/21   Dessa Phi, DO  nicotine (NICODERM CQ - DOSED IN MG/24 HR) 7 mg/24hr patch Place 1 patch (7 mg total) onto the skin daily. 06/23/21   Dessa Phi, DO  potassium chloride SA (KLOR-CON M) 20 MEQ tablet Take 1 daily with your lasix medication as directed As needed 07/19/21   Parrett, Fonnie Mu, NP      Allergies    Omnipaque [iohexol] and Penicillins    Review of Systems   Review of Systems  Cardiovascular:  Positive for leg swelling.  All other systems reviewed and are negative.   Physical Exam Updated Vital Signs BP (!) 126/114 (BP Location: Right Arm)   Pulse (!) 101   Temp 98.1 F (36.7 C) (Oral)   Resp 16   Ht 5\' 1"  (1.549 m)   Wt 33 kg   SpO2 100%   BMI 13.75 kg/m  Physical Exam Vitals and nursing note reviewed.  Constitutional:      Comments: Chronically ill  HENT:     Head: Normocephalic.  Nose: Nose normal.     Mouth/Throat:     Mouth: Mucous membranes are moist.  Eyes:     Extraocular Movements: Extraocular movements intact.     Pupils: Pupils are equal, round, and reactive to light.  Cardiovascular:     Rate and Rhythm: Normal rate and regular rhythm.     Pulses: Normal pulses.     Heart sounds: Normal heart sounds.  Pulmonary:     Comments: Crackles bilateral bases Abdominal:     General: Abdomen is flat.     Palpations: Abdomen is soft.  Musculoskeletal:     Cervical back: Normal range of motion and neck supple.     Comments: 2+ pitting edema bilaterally.  On the left side, there is venous stasis changes.  No obvious cellulitis  Skin:    General: Skin is warm.      Capillary Refill: Capillary refill takes less than 2 seconds.     Findings: Erythema present.  Neurological:     General: No focal deficit present.     Mental Status: She is oriented to person, place, and time.  Psychiatric:        Mood and Affect: Mood normal.        Behavior: Behavior normal.      ED Results / Procedures / Treatments   Labs (all labs ordered are listed, but only abnormal results are displayed) Labs Reviewed  CBC WITH DIFFERENTIAL/PLATELET  COMPREHENSIVE METABOLIC PANEL  BRAIN NATRIURETIC PEPTIDE  MAGNESIUM  I-STAT VENOUS BLOOD GAS, ED  I-STAT CHEM 8, ED  TROPONIN I (HIGH SENSITIVITY)    EKG None  Radiology No results found.  Procedures Procedures    Medications Ordered in ED Medications - No data to display  ED Course/ Medical Decision Making/ A&P                           Medical Decision Making Sherry [name] is a 76 y.o. female here presenting with bilateral leg swelling.  Patient has bilateral leg swelling going on for several weeks.  Patient was on diuretic that minimally helped her swelling but the swelling has came back and now she has venous stasis changes.  I think likely worsening dependent edema versus low albumin.  Plan to get CBC and CMP and BNP.  Patient will likely need diuresis.   7:45 PM I reviewed patient's labs. BNP 1572.  Chest x-ray showed bilateral pleural effusions.  Patient was given 40 mg of Lasix IV.  Will admit for CHF exacerbation  Amount and/or Complexity of Data Reviewed Labs: ordered. Decision-making details documented in ED Course. Radiology: ordered and independent interpretation performed. Decision-making details documented in ED Course.  Risk Prescription drug management.    Final Clinical Impression(s) / ED Diagnoses Final diagnoses:  None    Rx / DC Orders ED Discharge Orders     None         Drenda Freeze, MD 08/11/21 [id number]

## 2021-08-11 NOTE — Assessment & Plan Note (Addendum)
Echocardiogram with preserved LV systolic function 55 to 11%, with mild reduction in RV systolic function. RVSP 54,2 mmHg. Moderate to severe mitral valve regurgitation. Mild to moderate TR.   Chronic pulmonary hypertension.   Patient was placed on furosemide for IV diuresis, negative fluid balance was achieved, - 6,166 ml, with significant improvement in her symptoms.   At the time of her discharge systolic blood pressure 95 to 102 mmHg.   Patient will continue diuresis with spironolactone and torsemide.    Not on SGLT 2 inh due to urinary retention and risk of urinary tract infection.  Holding B blocker, ace inh or ARB due to risk of hypotension.   Patient has declined SNF.

## 2021-08-11 NOTE — Assessment & Plan Note (Addendum)
Chronic respiratory failure with hypoxia  No clinical signs of exacerbation, plan to continue with bronchodilator therapy and oxymetry monitoring Supplemental 02 per [place].

## 2021-08-11 NOTE — Assessment & Plan Note (Addendum)
Continue nutritional supplementation. Patient has declined SNF she does have a high risk for rehospitalization.

## 2021-08-11 NOTE — Assessment & Plan Note (Addendum)
Following with oncology, Dr. Lorna Few.  Completed prior treatment with chemoradiation and immunotherapy.  Currently under observation.  Anemia of chronic disease, likely related to malignancy, Hgb have been stable around 9. Check Iron panel as outpatient.

## 2021-08-12 ENCOUNTER — Ambulatory Visit: Payer: Medicare Other | Admitting: Adult Health

## 2021-08-12 DIAGNOSIS — Z7189 Other specified counseling: Secondary | ICD-10-CM

## 2021-08-12 DIAGNOSIS — I5033 Acute on chronic diastolic (congestive) heart failure: Secondary | ICD-10-CM | POA: Diagnosis not present

## 2021-08-12 DIAGNOSIS — J9611 Chronic respiratory failure with hypoxia: Secondary | ICD-10-CM

## 2021-08-12 LAB — COMPREHENSIVE METABOLIC PANEL
ALT: 11 U/L (ref 0–44)
AST: 17 U/L (ref 15–41)
Albumin: 2.7 g/dL — ABNORMAL LOW (ref 3.5–5.0)
Alkaline Phosphatase: 66 U/L (ref 38–126)
Anion gap: 5 (ref 5–15)
BUN: 20 mg/dL (ref 8–23)
CO2: 39 mmol/L — ABNORMAL HIGH (ref 22–32)
Calcium: 8.5 mg/dL — ABNORMAL LOW (ref 8.9–10.3)
Chloride: 99 mmol/L (ref 98–111)
Creatinine, Ser: 0.91 mg/dL (ref 0.44–1.00)
GFR, Estimated: >60 mL/min (ref >60)
Glucose, Bld: 73 mg/dL (ref 70–99)
Potassium: 3.7 mmol/L (ref 3.5–5.1)
Sodium: 143 mmol/L (ref 135–145)
Total Bilirubin: 0.8 mg/dL (ref 0.3–1.2)
Total Protein: 5.5 g/dL — ABNORMAL LOW (ref 6.5–8.1)

## 2021-08-12 LAB — URINALYSIS, ROUTINE W REFLEX MICROSCOPIC
Bacteria, UA: NONE SEEN
Bilirubin Urine: NEGATIVE
Glucose, UA: NEGATIVE mg/dL
Ketones, ur: NEGATIVE mg/dL
Leukocytes,Ua: NEGATIVE
Nitrite: NEGATIVE
Protein, ur: NEGATIVE mg/dL
Specific Gravity, Urine: 1.006 (ref 1.005–1.030)
pH: 8 (ref 5.0–8.0)

## 2021-08-12 LAB — CBC
HCT: 38.1 % (ref 36.0–46.0)
Hemoglobin: 11.4 g/dL — ABNORMAL LOW (ref 12.0–15.0)
MCH: 28.1 pg (ref 26.0–34.0)
MCHC: 29.9 g/dL — ABNORMAL LOW (ref 30.0–36.0)
MCV: 94.1 fL (ref 80.0–100.0)
Platelets: 207 10*3/uL (ref 150–400)
RBC: 4.05 MIL/uL (ref 3.87–5.11)
RDW: 18.6 % — ABNORMAL HIGH (ref 11.5–15.5)
WBC: 7.4 10*3/uL (ref 4.0–10.5)
nRBC: 0 % (ref 0.0–0.2)

## 2021-08-12 LAB — MAGNESIUM: Magnesium: 2 mg/dL (ref 1.7–2.4)

## 2021-08-12 MED ORDER — POLYETHYLENE GLYCOL 3350 17 G PO PACK
17.0000 g | PACK | Freq: Every day | ORAL | Status: DC
  Administered 2021-08-12 – 2021-08-19 (×4): 17 g via ORAL
  Filled 2021-08-12 (×6): qty 1

## 2021-08-12 MED ORDER — ENSURE ENLIVE PO LIQD
237.0000 mL | ORAL | Status: DC
  Administered 2021-08-12 – 2021-08-16 (×3): 237 mL via ORAL

## 2021-08-12 MED ORDER — BOOST / RESOURCE BREEZE PO LIQD CUSTOM
1.0000 | Freq: Two times a day (BID) | ORAL | Status: DC
  Administered 2021-08-14 – 2021-08-19 (×9): 1 via ORAL
  Filled 2021-08-12: qty 1

## 2021-08-12 MED ORDER — ALBUMIN HUMAN 25 % IV SOLN
25.0000 g | Freq: Four times a day (QID) | INTRAVENOUS | Status: DC
  Administered 2021-08-12 – 2021-08-13 (×3): 25 g via INTRAVENOUS
  Filled 2021-08-12 (×5): qty 100

## 2021-08-12 NOTE — Progress Notes (Signed)
PROGRESS NOTE    Sherry [name]  ZHG:[id number] DOB: [birth year] DOA: 08/11/2021 PCP: [name], NP  76/F with history of COPD, chronic diastolic CHF, moderate to severe MR, stage III non-small cell lung cancer currently under observation, chronic respiratory failure on 2 to 3 L home O2, CKD 3 AAA, severe malnutrition, depression, anxiety presented to the ED 8/3 with progressive lower extremity edema, dyspnea cough. -In the ED BNP was 1572, troponin 27, creatinine 0.8, chest x-ray noted worsening pulmonary vascular congestion, pulmonary edema and bilateral pleural effusions   Subjective: Feels a little better today, weak overall  Assessment and Plan:  Acute on chronic diastolic CHF (congestive heart failure) (HCC) -Remains significantly volume overloaded, third spacing from hypoalbuminemia is also contributing  -Last echo 06/21/2021 showed EF 55-60% with G2DD. -Continue IV Lasix 40 Mg twice daily, will add IV albumin -Monitor strict I/O's, daily weights -Supplement potassium  COPD (chronic obstructive pulmonary disease) (HCC) Chronic respiratory failure with hypoxia Stable without acute exacerbation. -Continue Trelegy and albuterol as needed -Continue home 2 L O2 via [place]  Chronic kidney disease, stage 3a ([place]) Monitor renal function with diuresis.  Stage III squamous cell carcinoma of left lung [facility]) Following with oncology, Dr. Lorna Few.  Completed prior treatment with chemoradiation and immunotherapy.  Currently under observation.  Severe protein calorie malnutrition (HCC) Estimated body mass index is 13.75 kg/m as calculated from the following:   Height as of this encounter: 5\' 1"  (1.549 m).   Weight as of this encounter: 33 kg.  -Consult nutrition  Ethics: Sherry [name] is an extremely frail cachectic 76 year old female with CHF, COPD, chronic respiratory failure, CKD lung cancer and severe malnutrition, I think her prognosis is very poor, discussed CODE STATUS she  is agreeable to DNR, will request palliative care eval as well  DVT prophylaxis: Lovenox Code Status: DNR Family Communication: Discussed with patient in detail no family at bedside Disposition Plan: To be determined  Consultants:    Procedures:   Antimicrobials:    Objective: Vitals:   08/12/21 0645 08/12/21 0709 08/12/21 0900 08/12/21 1045  BP: 118/87  134/83 (!) 151/93  Pulse: 79  90 96  Resp: (!) 22  20 19   Temp:  98.2 F (36.8 C)  98 F (36.7 C)  TempSrc:  Oral  Oral  SpO2: 100%  94% 96%  Weight:      Height:        Intake/Output Summary (Last 24 hours) at 08/12/2021 1221 Last data filed at 08/12/2021 0940 Gross per 24 hour  Intake --  Output 1700 ml  Net -1700 ml   Filed Weights   08/11/21 1627  Weight: 33 kg    Examination:  General exam: Frail extremely cachectic female sitting up in bed, AAOx3, no distress HEENT: No JVD CVS: S1-S2, regular rhythm Lungs: Poor air movement bilaterally, decreased breath sounds at the bases Abdomen: Soft, nontender, bowel sounds present Extremities: 2+ edema bilaterally, erythematous petechial rash throughout left lower leg  Psychiatry:  Mood & affect appropriate.     Data Reviewed:   CBC: Recent Labs  Lab 08/11/21 1750 08/11/21 1814 08/11/21 1816 08/12/21 0249  WBC 8.3  --   --  7.4  NEUTROABS 6.9  --   --   --   HGB 11.5* 12.2 12.6 11.4*  HCT 39.4 36.0 37.0 38.1  MCV 96.8  --   --  94.1  PLT 176  --   --  196   Basic Metabolic Panel: Recent Labs  Lab 08/11/21 1814 08/11/21 1816 08/11/21 2100 08/12/21 0249  NA 139 140 143 143  K 3.5 4.0 3.7 3.7  CL  --  103 99 99  CO2  --   --  37* 39*  GLUCOSE  --  73 96 73  BUN  --  26* 21 20  CREATININE  --  0.80 1.03* 0.91  CALCIUM  --   --  8.4* 8.5*  MG  --   --  2.0 2.0   GFR: Estimated Creatinine Clearance: 27.4 mL/min (by C-G formula based on SCr of 0.91 mg/dL). Liver Function Tests: Recent Labs  Lab 08/11/21 2100 08/12/21 0249  AST 19 17  ALT  11 11  ALKPHOS 72 66  BILITOT 0.5 0.8  PROT 6.0* 5.5*  ALBUMIN 2.9* 2.7*   No results for input(s): "LIPASE", "AMYLASE" in the last 168 hours. No results for input(s): "AMMONIA" in the last 168 hours. Coagulation Profile: No results for input(s): "INR", "PROTIME" in the last 168 hours. Cardiac Enzymes: No results for input(s): "CKTOTAL", "CKMB", "CKMBINDEX", "TROPONINI" in the last 168 hours. BNP (last 3 results) No results for input(s): "PROBNP" in the last 8760 hours. HbA1C: No results for input(s): "HGBA1C" in the last 72 hours. CBG: No results for input(s): "GLUCAP" in the last 168 hours. Lipid Profile: No results for input(s): "CHOL", "HDL", "LDLCALC", "TRIG", "CHOLHDL", "LDLDIRECT" in the last 72 hours. Thyroid Function Tests: No results for input(s): "TSH", "T4TOTAL", "FREET4", "T3FREE", "THYROIDAB" in the last 72 hours. Anemia Panel: No results for input(s): "VITAMINB12", "FOLATE", "FERRITIN", "TIBC", "IRON", "RETICCTPCT" in the last 72 hours. Urine analysis:    Component Value Date/Time   COLORURINE STRAW (A) 08/12/2021 1046   APPEARANCEUR CLEAR 08/12/2021 1046   LABSPEC 1.006 08/12/2021 1046   PHURINE 8.0 08/12/2021 1046   GLUCOSEU NEGATIVE 08/12/2021 1046   HGBUR SMALL (A) 08/12/2021 1046   BILIRUBINUR NEGATIVE 08/12/2021 1046   KETONESUR NEGATIVE 08/12/2021 1046   PROTEINUR NEGATIVE 08/12/2021 1046   NITRITE NEGATIVE 08/12/2021 1046   LEUKOCYTESUR NEGATIVE 08/12/2021 1046   Sepsis Labs: @LABRCNTIP (procalcitonin:4,lacticidven:4)  )No results found for this or any previous visit (from the past 240 hour(s)).   Radiology Studies: DG Chest 1 View  Result Date: 08/11/2021 CLINICAL DATA:  Shortness of breath EXAM: CHEST  1 VIEW COMPARISON:  07/05/2021 FINDINGS: Transverse diameter of Elnoria Howard is increased. Central pulmonary vessels are more prominent. There is interval increase in interstitial and alveolar markings in both lungs, more so in parahilar regions and lower  lung fields. Moderate bilateral pleural effusions are seen with interval increase. There is no pneumothorax. There is an electronic device partly obscuring the left parahilar region. There is previous vertebroplasty in multiple thoracic vertebrae. IMPRESSION: There is interval worsening of pulmonary vascular congestion and pulmonary edema. Moderate bilateral pleural effusions with interval increase. Evaluation of lower lung fields for infiltrates is limited by the effusions. Possibility of underlying atelectasis/pneumonia in the lower lung fields is not excluded. Electronically Signed   By: Elmer Picker M.D.   On: 08/11/2021 17:23   DG Tibia/Fibula Left  Result Date: 08/11/2021 CLINICAL DATA:  Leg pain and swelling EXAM: LEFT TIBIA AND FIBULA - 2 VIEW COMPARISON:  None Available. FINDINGS: There is no evidence of fracture or other focal bone lesions. Demineralization. Soft tissues are unremarkable. IMPRESSION: Negative. Electronically Signed   By: Placido Sou M.D.   On: 08/11/2021 17:23     Scheduled Meds:  enoxaparin (LOVENOX) injection  30 mg Subcutaneous Q24H   fluticasone furoate-vilanterol  1 puff Inhalation Daily   And   umeclidinium bromide  1 puff Inhalation Daily   furosemide  40 mg Intravenous Q12H   potassium chloride SA  20 mEq Oral Daily   sodium chloride flush  3 mL Intravenous Q12H   Continuous Infusions:  albumin human       LOS: 1 day    Time spent: 50min   Domenic Polite, MD [facility]   08/12/2021, 12:21 PM

## 2021-08-12 NOTE — Consult Note (Signed)
Consultation Note Date: 08/12/2021   Patient Name: Sherry [name]  DOB: [birth year]  MRN: [id number]  Age / Sex: 76 y.o., female  PCP: [name], NP Referring Physician: Domenic Polite, MD  Reason for Consultation: Establishing goals of care  HPI/Patient Profile: 76 y.o. female  with past medical history of COPD, chronic diastolic CHF (EF 03-12%), moderate-severe mitral regurgitation, stage III left-sided non-small cell lung cancer (s/p concurrent chemoradiation followed by immunotherapy, now under observation), chronic respiratory failure with hypoxia on 2 L O2 via [place], CKD stage IIIa, depression/anxiety, malnutrition, and former tobacco use admitted on 08/11/2021 with progressive lower extremity edema.   Patient admitted with acute on chronic diastolic CHF and has several chronic comorbidities including severe malnutrition with high risk for decompensation. PMT has been consulted to assist with goals of care conversation.  Clinical Assessment and Goals of Care:  I have reviewed medical records including EPIC notes, labs and imaging, received report from RN, assessed the patient and then met at the bedside with patient's son and daughter to discuss diagnosis prognosis, GOC, EOL wishes, disposition and options.  I introduced Palliative Medicine as specialized medical care for people living with serious illness. It focuses on providing relief from the symptoms and stress of a serious illness. The goal is to improve quality of life for both the patient and the family.  We discussed a brief life review of the patient and then focused on their current illness.   I attempted to elicit values and goals of care important to the patient.    Medical History Review and Understanding:  Reviewed patient's several comorbidities, with emphasis on acute heart failure and the long-term effects of recurrent exacerbations.  Patient  states that this is the most confusing to her.  We discussed her care plan in detail.  Patient and family report understanding.  Social History: Patient lives at home by herself.  Her children are worried that she is no longer able to do this, but she will not be willing to consider assistance or placement at a facility.  She has 4 siblings, 3 children.  She has been married twice.  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  Functional and Nutritional State: Patient has always been a light eater who "picks at her food".  Her functional status has declined lately, as she has had worsening frailty and malnutrition.  Family feels she would benefit from assistive device such as rollator.  Currently using anything for ambulation.  She has taken "bird baths" for years.  Palliative Symptoms: Weakness, depression anxiety, back and leg pain, edema  Code Status: Concepts specific to code status, artifical feeding and hydration, and rehospitalization were considered and discussed.  Patient and family confirms DNR.  Discussion: Patient's 2 children described as stubborn and hope that we will be able to make changes to help her succeed, whether this means placement at ALF, SNF, or if she is strong enough to return home with additional support from home health.  They also worry that she will be unwilling to consider any placement in a facility.  She is often frustrated by her inability to hear and understand what is being discussed.  Her younger sister has recently needed to be moved to ALF and this is likely weighing on the patient, as she is even weaker and more debilitated herself.  They feel she does not have a realistic understanding of how sick she is.  I reviewed the trajectory of heart failure  with decompensations, sharing my concern for poor prognosis and exploring whether patient would be interested in hospice philosophy.  We discussed this possibility if she would rather stay at home  and focus on her comfort in her preferred environment, although this is also not 24/7 care.  It is difficult for her to continue participating in the conversation, as she frequently is going to the restroom to urinate given Lasix administration.  She tells me she does not really understand what I am saying about her heart.  Her children are not sure she would be willing to consider end-of-life care or this transition.  We reviewed the referral process and need for further recommendations to guide in options for disposition.  I recommended at the minimum that outpatient palliative care is on board moving forward, whether she is at a facility or at home.  Patient's children are very appreciative and agreeable.   The difference between aggressive medical intervention and comfort care was considered in light of the patient's goals of care. Hospice and Palliative Care services outpatient were explained and offered.   Discussed the importance of continued conversation with family and the medical providers regarding overall plan of care and treatment options, ensuring decisions are within the context of the patient's values and GOCs.   Questions and concerns were addressed.  Hard Choices booklet left for review. The family was encouraged to call with questions or concerns.  PMT will continue to support holistically.   SUMMARY OF RECOMMENDATIONS   -DNR confirmed -Continue current care -Ongoing goals of care conversation with patient and family; children are realistic and patient seems to have a hard time understanding severity of her illness -Psychosocial and emotional support provided -PMT will continue to follow and support  Prognosis:  Unable to determine  Discharge Planning: To Be Determined      Primary Diagnoses: Present on Admission:  Acute on chronic diastolic CHF (congestive heart failure) (HCC)  Chronic kidney disease, stage 3a (HCC)  Chronic respiratory failure with hypoxia (HCC)   Malnutrition (HCC)  Stage III squamous cell carcinoma of left lung (HCC)  COPD (chronic obstructive pulmonary disease) ([place])   Physical Exam Vitals and nursing note reviewed.  Constitutional:      General: She is not in acute distress.    Appearance: She is cachectic. She is ill-appearing.  Cardiovascular:     Rate and Rhythm: Normal rate.  Pulmonary:     Effort: Pulmonary effort is normal.  Neurological:     Mental Status: She is alert.  Psychiatric:        Behavior: Behavior is cooperative.    Vital Signs: BP (!) 149/92 (BP Location: Right Arm)   Pulse 88   Temp 97.9 F (36.6 C) (Oral)   Resp 20   Ht $R'5\' 1"'jp$  (1.549 m)   Wt 34.2 kg   SpO2 98%   BMI 14.25 kg/m  Pain Scale: 0-10   Pain Score: 0-No pain   SpO2: SpO2: 98 % O2 Device:SpO2: 98 % O2 Flow Rate: .O2 Flow Rate (L/min): 2 L/min   Palliative Assessment/Data: TBD    MDM: High   Carlen Fils Johnnette Litter, PA-C  Palliative Medicine Team Team phone # [phone]  Thank you for allowing the Palliative Medicine Team to assist in the care of this patient. Please utilize secure chat with additional questions, if there is no response within 30 minutes please call the above phone number.  Palliative Medicine Team providers are available by phone from 7am to 7pm daily and  can be reached through the team cell phone.  Should this patient require assistance outside of these hours, please call the patient's attending physician.

## 2021-08-12 NOTE — Telephone Encounter (Signed)
Pt has OV with TP today 08/12/21.

## 2021-08-12 NOTE — Progress Notes (Signed)
Initial Nutrition Assessment  DOCUMENTATION CODES:   Underweight  INTERVENTION:  Liberalize diet from a heart healthy to a regular diet to provide widest variety of menu options to enhance nutritional adequacy Ensure Enlive po once daily, each supplement provides 350 kcal and 20 grams of protein. Boost Breeze po BID, each supplement provides 250 kcal and 9 grams of protein Magic cup TID with meals, each supplement provides 290 kcal and 9 grams of protein  NUTRITION DIAGNOSIS:   Underweight related to chronic illness (SCLC, chronic respiratory failure) as evidenced by other (comment) (BMI 14.25).  GOAL:   Patient will meet greater than or equal to 90% of their needs  MONITOR:   PO intake, Supplement acceptance, Labs, Weight trends, I & O's, Diet advancement  REASON FOR ASSESSMENT:   Consult Assessment of nutrition requirement/status  ASSESSMENT:   Pt admitted with progressive lower extremity edema r/t acute on chronic CHF. PMH significant for COPD, chronic diastolic CHF, moderate to severe MR, stage III non-small cell lung cancer currently under observation, chronic respiratory failure, CKD 3, AAA, severe malnutrition, depression, anxiety.  Pt in restroom and PA present speaking with pt's family at time of visit. Observed lunch tray on bedside table with some bites taken. They report that she does not like Ensure but will drink them with encouragement. Will try to offer Boost Breeze to see which supplement she can tolerate better. She also enjoys ice cream and fudge-pops, agrees to receive [employer].   Per RD assessment during prior admission, pt without chewing or swallowing difficulties but occasional issues with medications. She refused MVI with minerals because they "fill her up" and does not eat if she takes them. Will hold off on ordering at this time.   Reviewed wt history. Pt's wt appears to have remained stable within the last year. Her wt is +1.3 kg since 7/11. Uncertain  if this is r/t actual wt gain or fluid d/t acute exacerbation of CHF.   Edema: mild pitting RLE, moderate pitting LLE   Medications: lasix, miralax, klor-con IV drips: human albumin   Labs: ionized calcium 0.84  I/O's: -1730ml since admission  NUTRITION - FOCUSED PHYSICAL EXAM: Deferred to follow up.   Diet Order:   Diet Order             Diet regular Room service appropriate? Yes; Fluid consistency: Thin  Diet effective now                   EDUCATION NEEDS:   No education needs have been identified at this time  Skin:  Skin Assessment: Reviewed RN Assessment  Last BM:  PTA  Height:   Ht Readings from Last 1 Encounters:  08/12/21 5\' 1"  (1.549 m)    Weight:   Wt Readings from Last 1 Encounters:  08/12/21 34.2 kg   BMI:  Body mass index is 14.25 kg/m.  Estimated Nutritional Needs:   Kcal:  1400-1600  Protein:  55-70g  Fluid:  1.4-1.6L  Clayborne Dana, RDN, LDN Clinical Nutrition

## 2021-08-13 DIAGNOSIS — I5033 Acute on chronic diastolic (congestive) heart failure: Secondary | ICD-10-CM | POA: Diagnosis not present

## 2021-08-13 LAB — BASIC METABOLIC PANEL
Anion gap: 12 (ref 5–15)
BUN: 18 mg/dL (ref 8–23)
CO2: 39 mmol/L — ABNORMAL HIGH (ref 22–32)
Calcium: 9 mg/dL (ref 8.9–10.3)
Chloride: 92 mmol/L — ABNORMAL LOW (ref 98–111)
Creatinine, Ser: 1 mg/dL (ref 0.44–1.00)
GFR, Estimated: 58 mL/min — ABNORMAL LOW (ref >60)
Glucose, Bld: 78 mg/dL (ref 70–99)
Potassium: 3.7 mmol/L (ref 3.5–5.1)
Sodium: 143 mmol/L (ref 135–145)

## 2021-08-13 LAB — CBC
HCT: 33.5 % — ABNORMAL LOW (ref 36.0–46.0)
Hemoglobin: 10.2 g/dL — ABNORMAL LOW (ref 12.0–15.0)
MCH: 28.3 pg (ref 26.0–34.0)
MCHC: 30.4 g/dL (ref 30.0–36.0)
MCV: 92.8 fL (ref 80.0–100.0)
Platelets: 175 10*3/uL (ref 150–400)
RBC: 3.61 MIL/uL — ABNORMAL LOW (ref 3.87–5.11)
RDW: 18.6 % — ABNORMAL HIGH (ref 11.5–15.5)
WBC: 7.6 10*3/uL (ref 4.0–10.5)
nRBC: 0 % (ref 0.0–0.2)

## 2021-08-13 MED ORDER — SODIUM CHLORIDE 0.9 % IV SOLN
100.0000 mg | Freq: Two times a day (BID) | INTRAVENOUS | Status: DC
  Administered 2021-08-13 – 2021-08-14 (×3): 100 mg via INTRAVENOUS
  Filled 2021-08-13 (×5): qty 100

## 2021-08-13 MED ORDER — ALBUMIN HUMAN 25 % IV SOLN
25.0000 g | Freq: Four times a day (QID) | INTRAVENOUS | Status: AC
  Administered 2021-08-13 (×2): 25 g via INTRAVENOUS
  Filled 2021-08-13 (×2): qty 100

## 2021-08-13 MED ORDER — FUROSEMIDE 10 MG/ML IJ SOLN
20.0000 mg | Freq: Two times a day (BID) | INTRAMUSCULAR | Status: DC
Start: 2021-08-13 — End: 2021-08-14
  Administered 2021-08-13 – 2021-08-14 (×2): 20 mg via INTRAVENOUS
  Filled 2021-08-13 (×2): qty 2

## 2021-08-13 MED ORDER — SALINE SPRAY 0.65 % NA SOLN
1.0000 | NASAL | Status: DC | PRN
  Filled 2021-08-13: qty 44

## 2021-08-13 NOTE — Progress Notes (Signed)
PT Cancellation Note  Patient Details Name: Sherry [name] MRN: [id number] DOB: 07/29/1945   Cancelled Treatment:    Reason Eval/Treat Not Completed: Fatigue/lethargy limiting ability to participate this morning. Pt states she would like to continue sleeping and needs an hour before PT can return to complete evaluation. Will continue to follow and evaluate as able.   [place], PT, DPT   Acute Rehabilitation Department   Sandra Cockayne 08/13/2021, 8:12 AM

## 2021-08-13 NOTE — Progress Notes (Signed)
PROGRESS NOTE    Sherry [name]  IRC:[id number] DOB: 1945/09/05 DOA: 08/11/2021 PCP: [name], NP  76/F with history of COPD, chronic diastolic CHF, moderate to severe MR, stage III non-small cell lung cancer currently under observation, chronic respiratory failure on 2 to 3 L home O2, CKD 3 AAA, severe malnutrition, depression, anxiety presented to the ED 8/3 with progressive lower extremity edema, dyspnea cough. -In the ED BNP was 1572, troponin 27, creatinine 0.8, chest x-ray noted worsening pulmonary vascular congestion, pulmonary edema and bilateral pleural effusions   Subjective: -Feels better overall, breathing is improving  Assessment and Plan:  Acute on chronic diastolic CHF (congestive heart failure) (HCC) -Improving, remains volume overloaded, third spacing from hypoalbuminemia is also contributing  -Last echo 06/21/2021 showed EF 55-60% with G2DD. -Diuresed 2 L yesterday, continue IV Lasix today will decrease dose and repeat 2 doses of albumin -Monitor I's/O, daily weights, supplemental potassium  Left lower leg rash, cellulitis -Add IV doxycycline, multiple antibiotic allergies noted  COPD (chronic obstructive pulmonary disease) (HCC) Chronic respiratory failure with hypoxia Stable without acute exacerbation. -Continue Trelegy and albuterol as needed -Continue home 2 L O2 via [place]  Chronic kidney disease, stage 3a ([place]) -Stable  Stage III squamous cell carcinoma of left lung [facility]) Following with oncology, Dr. Lorna [name].  Completed prior treatment with chemoradiation and immunotherapy.  Currently under observation.  Severe protein calorie malnutrition (HCC) Estimated body mass index is 13.75 kg/m as calculated from the following:   Height as of this encounter: 5\' 1"  (1.549 m).   Weight as of this encounter: 33 kg.  -Consult nutrition  Ethics: Sherry [name] is an extremely frail cachectic 76 year old female with CHF, COPD, chronic respiratory failure, CKD  lung cancer and severe malnutrition, I think her prognosis is very poor, discussed CODE STATUS she was agreeable to DNR, will request palliative care eval as well  DVT prophylaxis: Lovenox Code Status: DNR Family Communication: No family at bedside, will update today  disposition Plan: Home likely with home health services in 1 to 2 days  Consultants:    Procedures:   Antimicrobials:    Objective: Vitals:   08/13/21 0013 08/13/21 0420 08/13/21 0700 08/13/21 0754  BP: 128/75 122/78  129/80  Pulse: 92 97  94  Resp: 20 20  (!) 21  Temp: 98.3 F (36.8 C) 98.3 F (36.8 C)  98.5 F (36.9 C)  TempSrc: Oral Oral  Oral  SpO2: 100% 100% 100% 100%  Weight:  32.6 kg    Height:        Intake/Output Summary (Last 24 hours) at 08/13/2021 1039 Last data filed at 08/13/2021 0920 Gross per 24 hour  Intake 194.24 ml  Output 1550 ml  Net -1355.76 ml   Filed Weights   08/11/21 1627 08/12/21 1356 08/13/21 0420  Weight: 33 kg 34.2 kg 32.6 kg    Examination:  General exam: Frail extremely cachectic female sitting up in bed, AAOx3, no distress HEENT: No JVD CVS: S1-S2, regular rhythm Lungs: Poor air movement bilaterally, decreased breath sounds at the bases Abdomen: Soft, nontender, bowel sounds present Extremities: 1+ edema bilaterally, erythematous petechial rash throughout left lower leg  Psychiatry:  Mood & affect appropriate.     Data Reviewed:   CBC: Recent Labs  Lab 08/11/21 1750 08/11/21 1814 08/11/21 1816 08/12/21 0249 08/13/21 0223  WBC 8.3  --   --  7.4 7.6  NEUTROABS 6.9  --   --   --   --  HGB 11.5* 12.2 12.6 11.4* 10.2*  HCT 39.4 36.0 37.0 38.1 33.5*  MCV 96.8  --   --  94.1 92.8  PLT 176  --   --  207 035   Basic Metabolic Panel: Recent Labs  Lab 08/11/21 1814 08/11/21 1816 08/11/21 2100 08/12/21 0249 08/13/21 0223  NA 139 140 143 143 143  K 3.5 4.0 3.7 3.7 3.7  CL  --  103 99 99 92*  CO2  --   --  37* 39* 39*  GLUCOSE  --  73 96 73 78  BUN  --   26* 21 20 18   CREATININE  --  0.80 1.03* 0.91 1.00  CALCIUM  --   --  8.4* 8.5* 9.0  MG  --   --  2.0 2.0  --    GFR: Estimated Creatinine Clearance: 24.6 mL/min (by C-G formula based on SCr of 1 mg/dL). Liver Function Tests: Recent Labs  Lab 08/11/21 2100 08/12/21 0249  AST 19 17  ALT 11 11  ALKPHOS 72 66  BILITOT 0.5 0.8  PROT 6.0* 5.5*  ALBUMIN 2.9* 2.7*   No results for input(s): "LIPASE", "AMYLASE" in the last 168 hours. No results for input(s): "AMMONIA" in the last 168 hours. Coagulation Profile: No results for input(s): "INR", "PROTIME" in the last 168 hours. Cardiac Enzymes: No results for input(s): "CKTOTAL", "CKMB", "CKMBINDEX", "TROPONINI" in the last 168 hours. BNP (last 3 results) No results for input(s): "PROBNP" in the last 8760 hours. HbA1C: No results for input(s): "HGBA1C" in the last 72 hours. CBG: No results for input(s): "GLUCAP" in the last 168 hours. Lipid Profile: No results for input(s): "CHOL", "HDL", "LDLCALC", "TRIG", "CHOLHDL", "LDLDIRECT" in the last 72 hours. Thyroid Function Tests: No results for input(s): "TSH", "T4TOTAL", "FREET4", "T3FREE", "THYROIDAB" in the last 72 hours. Anemia Panel: No results for input(s): "VITAMINB12", "FOLATE", "FERRITIN", "TIBC", "IRON", "RETICCTPCT" in the last 72 hours. Urine analysis:    Component Value Date/Time   COLORURINE STRAW (A) 08/12/2021 1046   APPEARANCEUR CLEAR 08/12/2021 1046   LABSPEC 1.006 08/12/2021 1046   PHURINE 8.0 08/12/2021 1046   GLUCOSEU NEGATIVE 08/12/2021 1046   HGBUR SMALL (A) 08/12/2021 1046   BILIRUBINUR NEGATIVE 08/12/2021 1046   KETONESUR NEGATIVE 08/12/2021 1046   PROTEINUR NEGATIVE 08/12/2021 1046   NITRITE NEGATIVE 08/12/2021 1046   LEUKOCYTESUR NEGATIVE 08/12/2021 1046   Sepsis Labs: @LABRCNTIP (procalcitonin:4,lacticidven:4)  )No results found for this or any previous visit (from the past 240 hour(s)).   Radiology Studies: DG Chest 1 View  Result Date:  08/11/2021 CLINICAL DATA:  Shortness of breath EXAM: CHEST  1 VIEW COMPARISON:  07/05/2021 FINDINGS: Transverse diameter of Elnoria Howard is increased. Central pulmonary vessels are more prominent. There is interval increase in interstitial and alveolar markings in both lungs, more so in parahilar regions and lower lung fields. Moderate bilateral pleural effusions are seen with interval increase. There is no pneumothorax. There is an electronic device partly obscuring the left parahilar region. There is previous vertebroplasty in multiple thoracic vertebrae. IMPRESSION: There is interval worsening of pulmonary vascular congestion and pulmonary edema. Moderate bilateral pleural effusions with interval increase. Evaluation of lower lung fields for infiltrates is limited by the effusions. Possibility of underlying atelectasis/pneumonia in the lower lung fields is not excluded. Electronically Signed   By: Elmer Picker M.D.   On: 08/11/2021 17:23   DG Tibia/Fibula Left  Result Date: 08/11/2021 CLINICAL DATA:  Leg pain and swelling EXAM: LEFT TIBIA AND FIBULA - 2 VIEW  COMPARISON:  None Available. FINDINGS: There is no evidence of fracture or other focal bone lesions. Demineralization. Soft tissues are unremarkable. IMPRESSION: Negative. Electronically Signed   By: Placido Sou M.D.   On: 08/11/2021 17:23     Scheduled Meds:  enoxaparin (LOVENOX) injection  30 mg Subcutaneous Q24H   feeding supplement  1 Container Oral BID BM   feeding supplement  237 mL Oral Q24H   fluticasone furoate-vilanterol  1 puff Inhalation Daily   And   umeclidinium bromide  1 puff Inhalation Daily   furosemide  20 mg Intravenous Q12H   polyethylene glycol  17 g Oral Daily   potassium chloride SA  20 mEq Oral Daily   sodium chloride flush  3 mL Intravenous Q12H   Continuous Infusions:  albumin human     doxycycline (VIBRAMYCIN) IV       LOS: 2 days    Time spent: 33min   Domenic Polite, MD [facility]   08/13/2021, 10:39 AM

## 2021-08-13 NOTE — Progress Notes (Signed)
Nutrition Follow-up  DOCUMENTATION CODES:   Underweight  INTERVENTION:   -Continue Magic cup TID with meals, each supplement provides 290 kcal and 9 grams of protein  -Continue liberalized diet of regular -Continue Boost Breeze po BID, each supplement provides 250 kcal and 9 grams of protein  -Continue Ensure Enlive po daly, each supplement provides 350 kcal and 20 grams of protein.   NUTRITION DIAGNOSIS:   Underweight related to chronic illness (SCLC, chronic respiratory failure) as evidenced by other (comment) (BMI 14.25).  Ongoing  GOAL:   Patient will meet greater than or equal to 90% of their needs  Progressing  MONITOR:   PO intake, Supplement acceptance, Labs, Weight trends, I & O's, Diet advancement  REASON FOR ASSESSMENT:   Consult Assessment of nutrition requirement/status  ASSESSMENT:   Pt admitted with progressive lower extremity edema r/t acute on chronic CHF. PMH significant for COPD, chronic diastolic CHF, moderate to severe MR, stage III non-small cell lung cancer currently under observation, chronic respiratory failure, CKD 3, AAA, severe malnutrition, depression, anxiety.  Reviewed I/O's: -1.6 L x 24 hours and -2.1 L since admission  UOP: 1.7 L x 24 hours  Pt unavailable at time of visit. Attempted to speak with pt via call to hospital room phone, however, unable to reach. RD unable to obtain further nutrition-related history or complete nutrition-focused physical exam at this time.    Pt diet has been liberalized to regular. No meal completion data documented to assess at this time.  Pt with variable acceptance of supplements, especially Ensure. Boost Gwyneth Revels has been ordered to try, but pt has not tried yet.   Per MD notes, pt with very poor prognosis. She is currently a DNR. Palliative care consult pending for goals of care discussions.   Medications reviewed and include lasix, miralax, and potassium chloride.  Labs reviewed: CBGS: 117  (inpatient orders for glycemic control are none).    Diet Order:   Diet Order             Diet regular Room service appropriate? Yes; Fluid consistency: Thin  Diet effective now                   EDUCATION NEEDS:   No education needs have been identified at this time  Skin:  Skin Assessment: Reviewed RN Assessment  Last BM:  PTA  Height:   Ht Readings from Last 1 Encounters:  08/12/21 5\' 1"  (1.549 m)    Weight:   Wt Readings from Last 1 Encounters:  08/13/21 32.6 kg   BMI:  Body mass index is 13.57 kg/m.  Estimated Nutritional Needs:   Kcal:  1400-1600  Protein:  55-70g  Fluid:  1.4-1.6L    Loistine Chance, RD, LDN, [place] Registered Dietitian II Certified Diabetes Care and Education Specialist Please refer to [facility] for RD and/or RD on-call/weekend/after hours pager

## 2021-08-13 NOTE — Progress Notes (Signed)
Daily Progress Note   Patient Name: Sherry [name]       Date: 08/13/2021 DOB: 08/02/1945  Age: 76 y.o. MRN#: [id number] Attending Physician: Domenic Polite, MD Primary Care Physician: [name], NP Admit Date: 08/11/2021  Reason for Consultation/Follow-up: Establishing goals of care  Subjective: Medical records reviewed. Patient assessed at the bedside.  She is sitting on the edge of the bed, working with PT.  No family present during my visit.  Continued conversation regarding patient's illness, providing education on typical trajectory with congestive heart failure and impact of recurrent decompensations on overall prognosis.  Considered patient to consider options based on her overall goals of care.  Counseled that if her priority is to improve her functioning for prolonging her life, she should consider increasing assistance/caregiver support and SNF placement if indicated for rehabilitation (family has said she has refused this adamantly).  I then reviewed the option for hospice at home if her priority is to remain in her preferred environment focusing on comfort and allowing the natural disease process to continue.  She is quick to state that she is not ready to "rest, be comfortable, and die."  She becomes more anxious and emotional support was provided.  She states "wouldn't you be anxious if someone was talking to you about going somewhere that is not home."  Outpatient palliative care was explained and offered.  Patient is agreeable as this conversation has been very helpful for her to consider her options in a different light.  I then called patient's son Sherry [name] to provide updates on the above conversation and he is very Patent attorney.  He has talked with patient's PCP today about  possible transition to ALF as well.  We discussed the possibility that patient will not make enough progress after short-term SNF stay to return home.  He verbalizes understanding  Questions and concerns addressed. PMT will continue to support holistically.   Length of Stay: 2   Physical Exam Vitals and nursing note reviewed.  Constitutional:      General: She is not in acute distress.    Appearance: She is cachectic.     Interventions: Nasal cannula in place.  Cardiovascular:     Rate and Rhythm: Normal rate.  Pulmonary:     Effort: Pulmonary effort is normal.  Skin:  General: Skin is warm and dry.  Neurological:     Mental Status: Mental status is at baseline.  Psychiatric:        Mood and Affect: Mood is anxious.        Behavior: Behavior normal.             Vital Signs: BP 127/80 (BP Location: Left Arm)   Pulse 90   Temp 98.4 F (36.9 C) (Oral)   Resp (!) 25   Ht 5\' 1"  (1.549 m)   Wt 32.6 kg   SpO2 98%   BMI 13.57 kg/m  SpO2: SpO2: 98 % O2 Device: O2 Device: Nasal Cannula O2 Flow Rate: O2 Flow Rate (L/min): 2 L/min      Palliative Assessment/Data: 40 to 50%     Palliative Care Assessment & Plan   Patient Profile: 76 y.o. female  with past medical history of COPD, chronic diastolic CHF (EF 32-35%), moderate-severe mitral regurgitation, stage III left-sided non-small cell lung cancer (s/p concurrent chemoradiation followed by immunotherapy, now under observation), chronic respiratory failure with hypoxia on 2 L O2 via [place], CKD stage IIIa, depression/anxiety, malnutrition, and former tobacco use admitted on 08/11/2021 with progressive lower extremity edema.    Patient admitted with acute on chronic diastolic CHF and has several chronic comorbidities including severe malnutrition with high risk for decompensation. PMT has been consulted to assist with goals of care conversation.  Assessment: Acute on chronic diastolic heart failure CKD 3A Chronic respiratory  failure with hypoxia Severe malnutrition COPD Stage III lung cancer Left leg cellulitis Goals of care conversation  Recommendations/Plan: Continue DNR Continue current care Patient seems more amenable to SNF after conversations today - she is very motivated to improve from this acute illness and regain functioning, as she is not ready for hospice philosophy or end-of-life Patient and family agreeable to outpatient palliative care for ongoing goals of care discussions based on her progress, will consult TOC for assistance with referral Psychosocial emotional support provided PMT will continue to follow and support as needed   Prognosis: Guarded to poor given nutritional and functional decline with several chronic comorbidities and recurrent hospitalizations  Discharge Planning: To Be Determined  Care plan was discussed with patient, patient's son, Katie PT, Dr. Broadus John   MDM high         Aryn Safran Johnnette Litter, PA-C  Palliative Medicine Team Team phone # [phone]  Thank you for allowing the Palliative Medicine Team to assist in the care of this patient. Please utilize secure chat with additional questions, if there is no response within 30 minutes please call the above phone number.  Palliative Medicine Team providers are available by phone from 7am to 7pm daily and can be reached through the team cell phone.  Should this patient require assistance outside of these hours, please call the patient's attending physician.

## 2021-08-13 NOTE — Evaluation (Signed)
Physical Therapy Evaluation Patient Details Name: Sherry [name] MRN: [id number] DOB: September 11, 1945 Today's Date: 08/13/2021  History of Present Illness  The pt is a 76 yo female presenting 8/3 with BLE swelling. Found to have acute CHF exacerbation. PMH includes: lung cancer, COPD, CHF, mitral regurgitation, chronic respiratory failure with hypoxia on 2L O2, CKD III, depression/anxiety, malnutrition, and former tobacco use.   Clinical Impression  Pt in bed upon arrival of PT, agreeable to evaluation at this time. Prior to admission the pt was ambulating with intermittent use of rollator, but reports independence with ADLs and IADLs. The pt now presents with limitations in functional mobility, strength, power, endurance, and dynamic stability due to above dx, and will continue to benefit from skilled PT to address these deficits. The pt required assist to complete bed mobility and at least single UE support and minA to complete sit-stand transfers and short bouts of gait in the room. Ambulation limited by fatigue and persistent urinary incontinence with standing. Pt motivated to improve mobility to point where she can eventually return home. At this time, pt needs more assist and supervision than is available at home, will benefit from short stint SNF to build strength and reduce risk of falls.         Recommendations for follow up therapy are one component of a multi-disciplinary discharge planning process, led by the attending physician.  Recommendations may be updated based on patient status, additional functional criteria and insurance authorization.  Follow Up Recommendations Skilled nursing-short term rehab (<3 hours/day) Can patient physically be transported by private vehicle: Yes    Assistance Recommended at Discharge Frequent or constant Supervision/Assistance  Patient can return home with the following  A little help with walking and/or transfers;A little help with  bathing/dressing/bathroom;Assistance with cooking/housework;Direct supervision/assist for medications management;Direct supervision/assist for financial management    Equipment Recommendations None recommended by PT  Recommendations for Other Services  OT consult    Functional Status Assessment Patient has had a recent decline in their functional status and demonstrates the ability to make significant improvements in function in a reasonable and predictable amount of time.     Precautions / Restrictions Precautions Precautions: Fall Precaution Comments: 2L O2 at baseline Restrictions Weight Bearing Restrictions: No      Mobility  Bed Mobility Overal bed mobility: Needs Assistance Bed Mobility: Supine to Sit     Supine to sit: Mod assist     General bed mobility comments: modA to pull to sitting EOB, increased time and use of bed rails    Transfers Overall transfer level: Needs assistance Equipment used: 1 person hand held assist Transfers: Sit to/from Stand Sit to Stand: Min assist, Min guard           General transfer comment: minA initially, progressed to minG with single UE support. poor power in BLE needing both UE and LE to rise to standing    Ambulation/Gait Ambulation/Gait assistance: Min assist Gait Distance (Feet): 15 Feet Assistive device: 1 person hand held assist Gait Pattern/deviations: Step-through pattern, Decreased stride length, Shuffle Gait velocity: decerased Gait velocity interpretation: <1.31 ft/sec, indicative of household ambulator   General Gait Details: small steps, reaching for single UE support, minimal clearance and stride length     Balance Overall balance assessment: Needs assistance Sitting-balance support: No upper extremity supported, Feet supported Sitting balance-Leahy Scale: Good     Standing balance support: Single extremity supported, During functional activity Standing balance-Leahy Scale: Fair Standing balance  comment: can static stand  without assist, at least single UE support for gait                             Pertinent Vitals/Pain Pain Assessment Pain Assessment: No/denies pain    Home Living Family/patient expects to be discharged to:: Private residence Living Arrangements: Alone Available Help at Discharge: Family;Available PRN/intermittently Type of Home: Apartment Home Access: Level entry       Home Layout: One level Home Equipment: Rollator (4 wheels);Grab bars - toilet;Grab bars - tub/shower      Prior Function Prior Level of Function : Needs assist             Mobility Comments: pt reports independent with intermittent use of rollator ADLs Comments: pt reports independent with ADLs. fa,o     Hand Dominance   Dominant Hand: Right    Extremity/Trunk Assessment   Upper Extremity Assessment Upper Extremity Assessment: Generalized weakness    Lower Extremity Assessment Lower Extremity Assessment: Generalized weakness    Cervical / Trunk Assessment Cervical / Trunk Assessment: Kyphotic  Communication   Communication: No difficulties  Cognition Arousal/Alertness: Awake/alert Behavior During Therapy: WFL for tasks assessed/performed Overall Cognitive Status: Within Functional Limits for tasks assessed                                 General Comments: once awoken, pt with no difficulty maintaining. able to answer all questions appropriately and follow commands. incontinent of urine in standing multiple times with little awareness        General Comments General comments (skin integrity, edema, etc.): VSS on 2L        Assessment/Plan    PT Assessment Patient needs continued PT services  PT Problem List Decreased strength;Decreased activity tolerance;Decreased balance;Decreased mobility       PT Treatment Interventions DME instruction;Gait training;Stair training;Functional mobility training;Therapeutic activities;Therapeutic  exercise;Balance training;Patient/family education    PT Goals (Current goals can be found in the Care Plan section)  Acute Rehab PT Goals Patient Stated Goal: return home PT Goal Formulation: With patient Time For Goal Achievement: 08/27/21 Potential to Achieve Goals: Good    Frequency Min 3X/week        AM-PAC PT "6 Clicks" Mobility  Outcome Measure Help needed turning from your back to your side while in a flat bed without using bedrails?: A Little Help needed moving from lying on your back to sitting on the side of a flat bed without using bedrails?: A Little Help needed moving to and from a bed to a chair (including a wheelchair)?: A Little Help needed standing up from a chair using your arms (e.g., wheelchair or bedside chair)?: A Little Help needed to walk in hospital room?: A Lot Help needed climbing 3-5 steps with a railing? : A Lot 6 Click Score: 16    End of Session Equipment Utilized During Treatment: Gait belt Activity Tolerance: Patient tolerated treatment well Patient left: in chair;with call bell/phone within reach;with chair alarm set Nurse Communication: Mobility status PT Visit Diagnosis: Other abnormalities of gait and mobility (R26.89)    Time: 8563-1497 PT Time Calculation (min) (ACUTE ONLY): 61 min   Charges:   PT Evaluation $PT Eval Low Complexity: 1 Low PT Treatments $Therapeutic Exercise: 8-22 mins $Therapeutic Activity: 23-37 mins        [place] Carbo, PT, DPT   Acute Rehabilitation Department  Sandra Cockayne  08/13/2021, 6:27 PM

## 2021-08-14 DIAGNOSIS — I5033 Acute on chronic diastolic (congestive) heart failure: Secondary | ICD-10-CM | POA: Diagnosis not present

## 2021-08-14 LAB — BASIC METABOLIC PANEL
Anion gap: 12 (ref 5–15)
BUN: 16 mg/dL (ref 8–23)
CO2: 37 mmol/L — ABNORMAL HIGH (ref 22–32)
Calcium: 8.9 mg/dL (ref 8.9–10.3)
Chloride: 93 mmol/L — ABNORMAL LOW (ref 98–111)
Creatinine, Ser: 0.94 mg/dL (ref 0.44–1.00)
GFR, Estimated: >60 mL/min (ref >60)
Glucose, Bld: 90 mg/dL (ref 70–99)
Potassium: 3 mmol/L — ABNORMAL LOW (ref 3.5–5.1)
Sodium: 142 mmol/L (ref 135–145)

## 2021-08-14 MED ORDER — SENNOSIDES-DOCUSATE SODIUM 8.6-50 MG PO TABS
1.0000 | ORAL_TABLET | Freq: Two times a day (BID) | ORAL | Status: DC
  Administered 2021-08-14 – 2021-08-19 (×7): 1 via ORAL
  Filled 2021-08-14 (×7): qty 1

## 2021-08-14 MED ORDER — FUROSEMIDE 40 MG PO TABS
40.0000 mg | ORAL_TABLET | Freq: Every day | ORAL | Status: DC
  Administered 2021-08-14 – 2021-08-15 (×2): 40 mg via ORAL
  Filled 2021-08-14 (×2): qty 1

## 2021-08-14 MED ORDER — DOXYCYCLINE HYCLATE 100 MG PO TABS
100.0000 mg | ORAL_TABLET | Freq: Two times a day (BID) | ORAL | Status: DC
  Administered 2021-08-14 – 2021-08-16 (×4): 100 mg via ORAL
  Filled 2021-08-14 (×5): qty 1

## 2021-08-14 MED ORDER — POTASSIUM CHLORIDE CRYS ER 20 MEQ PO TBCR
40.0000 meq | EXTENDED_RELEASE_TABLET | Freq: Two times a day (BID) | ORAL | Status: AC
  Administered 2021-08-14 (×2): 40 meq via ORAL
  Filled 2021-08-14 (×2): qty 2

## 2021-08-14 MED ORDER — PANTOPRAZOLE SODIUM 40 MG PO TBEC
40.0000 mg | DELAYED_RELEASE_TABLET | Freq: Every day | ORAL | Status: DC
  Administered 2021-08-14 – 2021-08-19 (×6): 40 mg via ORAL
  Filled 2021-08-14 (×6): qty 1

## 2021-08-14 MED ORDER — BISACODYL 10 MG RE SUPP
10.0000 mg | Freq: Once | RECTAL | Status: AC
  Administered 2021-08-14: 10 mg via RECTAL
  Filled 2021-08-14: qty 1

## 2021-08-14 NOTE — NC FL2 (Addendum)
[place] LEVEL OF CARE SCREENING TOOL     IDENTIFICATION  Patient Name: Sherry [name] Birthdate: 1945-01-21 Sex: female Admission Date (Current Location): 08/11/2021  [facility] and [place] Number:  Herbalist and Address:  [facility] [place], [place] [address] [place], [place] [address]      Provider Number: [id number]  Attending Physician Name and Address:  Domenic Polite, MD  Relative Name and Phone Number:       Current Level of Care: Hospital Recommended Level of Care: [place] Prior Approval Number:    Date Approved/Denied:   PASRR Number: Under Review  Discharge Plan: SNF    Current Diagnoses: Patient Active Problem List   Diagnosis Date Noted   Chronic respiratory failure with hypoxia ([place]) 07/19/2021   Abrasion of skin 07/19/2021   Acute on chronic diastolic CHF (congestive heart failure) ([place]) 06/22/2021   Chronic kidney disease, stage 3a ([place]) 06/22/2021   Vertigo 06/22/2021   Protein-calorie malnutrition, severe 06/22/2021   Acute respiratory failure ([place]) 06/21/2021   Syncope and collapse 06/21/2021   DNR (do not resuscitate) 06/21/2021   Constipation 03/06/2018   Stage III squamous cell carcinoma of left lung ([place]) 11/27/2017   Encounter for antineoplastic immunotherapy 11/27/2017   Thoracic compression fracture ([place]) 11/02/2017   Acute right-sided thoracic back pain 10/30/2017   Angular cheilitis 10/22/2017   Cellulitis of right knee 10/22/2017   Encounter for antineoplastic chemotherapy 09/09/2017   Goals of care, counseling/discussion 09/09/2017   Malnutrition ([place]) 09/09/2017   Pneumothorax 08/31/2017   Primary malignant neoplasm of bronchus of left lower lobe (HCC) 08/26/2017   Mediastinal mass 08/23/2017   COPD (chronic obstructive pulmonary disease) ([place]) 03/10/2013   Osteoporosis 09/20/2012   Depressive disorder 05/28/2012   Migraine 05/28/2012    Orientation RESPIRATION BLADDER  Height & Weight     Self, Time, Situation  O2 External catheter, Continent Weight: 68 lb 6.4 oz (31 kg) Height:  5\' 1"  (154.9 cm)  BEHAVIORAL SYMPTOMS/MOOD NEUROLOGICAL BOWEL NUTRITION STATUS      Continent Diet (please see discharge summary)  AMBULATORY STATUS COMMUNICATION OF NEEDS Skin   Limited Assist Verbally  (Eccymosis & scratch marks)                       Personal Care Assistance Level of Assistance  Bathing, Feeding, Dressing Bathing Assistance: Limited assistance Feeding assistance: Independent Dressing Assistance: Limited assistance     Functional Limitations Info  Sight, Hearing, Speech Sight Info: Adequate Hearing Info: Adequate Speech Info: Adequate    SPECIAL CARE FACTORS FREQUENCY  PT (By licensed PT), OT (By licensed OT)     PT Frequency: 5x per week OT Frequency: 5x per week            Contractures Contractures Info: Not present    Additional Factors Info  Code Status, Allergies Code Status Info: DNR Allergies Info: Omnipaque,Penicillins           Current Medications (08/14/2021):  This is the current hospital active medication list Current Facility-Administered Medications  Medication Dose Route Frequency Provider Last Rate Last Admin   acetaminophen (TYLENOL) tablet 650 mg  650 mg Oral Q6H PRN Lenore Cordia, MD   650 mg at 08/14/21 6203   Or   acetaminophen (TYLENOL) suppository 650 mg  650 mg Rectal Q6H PRN Lenore Cordia, MD       albuterol (PROVENTIL) (2.5 MG/3ML) 0.083% nebulizer solution 2.5 mg  2.5  mg Inhalation Q6H PRN Lenore Cordia, MD       doxycycline (VIBRAMYCIN) 100 mg in sodium chloride 0.9 % 250 mL IVPB  100 mg Intravenous Q12H Domenic Polite, MD 125 mL/hr at 08/14/21 0925 100 mg at 08/14/21 0925   enoxaparin (LOVENOX) injection 30 mg  30 mg Subcutaneous Q24H Zada Finders R, MD   30 mg at 08/13/21 2109   feeding supplement (BOOST / RESOURCE BREEZE) liquid 1 Container  1 Container Oral BID BM Domenic Polite, MD   1  Container at 08/14/21 0920   feeding supplement (ENSURE ENLIVE / ENSURE PLUS) liquid 237 mL  237 mL Oral Q24H Domenic Polite, MD   237 mL at 08/13/21 2006   fluticasone furoate-vilanterol (BREO ELLIPTA) 100-25 MCG/ACT 1 puff  1 puff Inhalation Daily Lenore Cordia, MD   1 puff at 08/14/21 0727   And   umeclidinium bromide (INCRUSE ELLIPTA) 62.5 MCG/ACT 1 puff  1 puff Inhalation Daily Lenore Cordia, MD   1 puff at 08/14/21 0727   furosemide (LASIX) tablet 40 mg  40 mg Oral Daily Domenic Polite, MD   40 mg at 08/14/21 0916   HYDROcodone-acetaminophen (NORCO/VICODIN) 5-325 MG per tablet 1 tablet  1 tablet Oral Q6H PRN Lenore Cordia, MD   1 tablet at 08/11/21 2153   ondansetron (ZOFRAN) tablet 4 mg  4 mg Oral Q6H PRN Lenore Cordia, MD       Or   ondansetron (ZOFRAN) injection 4 mg  4 mg Intravenous Q6H PRN Lenore Cordia, MD       pantoprazole (PROTONIX) EC tablet 40 mg  40 mg Oral Q1200 Domenic Polite, MD       polyethylene glycol (MIRALAX / GLYCOLAX) packet 17 g  17 g Oral Daily Domenic Polite, MD   17 g at 08/12/21 1540   potassium chloride SA (KLOR-CON M) CR tablet 40 mEq  40 mEq Oral BID Domenic Polite, MD   40 mEq at 08/14/21 4132   senna-docusate (Senokot-S) tablet 1 tablet  1 tablet Oral BID Domenic Polite, MD   1 tablet at 08/14/21 0916   sodium chloride (OCEAN) 0.65 % nasal spray 1 spray  1 spray Each Nare PRN Domenic Polite, MD       sodium chloride flush (NS) 0.9 % injection 3 mL  3 mL Intravenous Q12H Lenore Cordia, MD   3 mL at 08/14/21 0930     Discharge Medications: Please see discharge summary for a list of discharge medications.  Relevant Imaging Results:  Relevant Lab Results:   Additional Information SSN [id number]  Outpatient palliative to follow at [place], LCSW

## 2021-08-14 NOTE — TOC Initial Note (Signed)
Transition of Care [facility]) - Initial/Assessment Note    Patient Details  Name: Sherry [name] MRN: [id number] Date of Birth: Feb 10, 1945  [facility]) CM/SW Contact:    Vinie Sill, LCSW Phone Number: 08/14/2021, 11:36 AM  Clinical Narrative:                 CSW met with patient at bedside. CSW introduced self and explained role. Patient states she lives home home. Patient states she is aware of PT/OT recommendations of short tern rehab at [facility] and is agreeable to SNF placement. CSW explained the SNF process. No preferred SNF at this time. Patient states no questions or concerns at this time.   PASRR # Pending  TOC will provide bed offers once available  TOC will continue to follow and assist with discharge planning.   Thurmond Butts, MSW, LCSW Clinical Social Worker       Expected Discharge Plan: Skilled Nursing Facility Barriers to Discharge: [place] (PASRR), Insurance Authorization, Continued Medical Work up, SNF Pending bed offer   Patient Goals and CMS Choice        Expected Discharge Plan and Services Expected Discharge Plan: [place] In-house Referral: Clinical Social Work     Living arrangements for the past 2 months: Apartment                                      Prior Living Arrangements/Services Living arrangements for the past 2 months: Apartment Lives with:: Self Patient language and need for interpreter reviewed:: No        Need for Family Participation in Patient Care: Yes (Comment)     Criminal Activity/Legal Involvement Pertinent to Current Situation/Hospitalization: No - Comment as needed  Activities of Daily Living      Permission Sought/Granted Permission sought to share information with : Family Supports Permission granted to share information with : Yes, Verbal Permission Granted     Permission granted to share info w AGENCY: SNFs        Emotional Assessment Appearance::  Appears stated age Attitude/Demeanor/Rapport: Engaged Affect (typically observed): Appropriate Orientation: : Oriented to Self, Oriented to Place, Oriented to  Time, Oriented to Situation Alcohol / Substance Use: Not Applicable Psych Involvement: No (comment)  Admission diagnosis:  Acute on chronic right-sided congestive heart failure ([place]) [I50.813] Acute on chronic heart failure with preserved ejection fraction (HFpEF) ([place]) [I50.33] Patient Active Problem List   Diagnosis Date Noted   Chronic respiratory failure with hypoxia ([place]) 07/19/2021   Abrasion of skin 07/19/2021   Acute on chronic diastolic CHF (congestive heart failure) ([place]) 06/22/2021   Chronic kidney disease, stage 3a ([place]) 06/22/2021   Vertigo 06/22/2021   Protein-calorie malnutrition, severe 06/22/2021   Acute respiratory failure ([place]) 06/21/2021   Syncope and collapse 06/21/2021   DNR (do not resuscitate) 06/21/2021   Constipation 03/06/2018   Stage III squamous cell carcinoma of left lung ([place]) 11/27/2017   Encounter for antineoplastic immunotherapy 11/27/2017   Thoracic compression fracture ([place]) 11/02/2017   Acute right-sided thoracic back pain 10/30/2017   Angular cheilitis 10/22/2017   Cellulitis of right knee 10/22/2017   Encounter for antineoplastic chemotherapy 09/09/2017   Goals of care, counseling/discussion 09/09/2017   Malnutrition ([place]) 09/09/2017   Pneumothorax 08/31/2017   Primary malignant neoplasm of bronchus of left lower lobe ([place]) 08/26/2017   Mediastinal mass 08/23/2017   COPD (chronic obstructive pulmonary  disease) ([place]) 03/10/2013   Osteoporosis 09/20/2012   Depressive disorder 05/28/2012   Migraine 05/28/2012   PCP:  [name], NP Pharmacy:   [facility] [place], [address] [place] [place] [address] Phone: [phone] Fax: [phone]  [facility] [address] [place] [address] Phone: [phone] Fax: [phone]     Social Determinants of Health (SDOH) Interventions    Readmission Risk Interventions     No data to display

## 2021-08-14 NOTE — Evaluation (Signed)
Occupational Therapy Evaluation Patient Details Name: Sherry [name] MRN: [id number] DOB: 04-09-1945 Today's Date: 08/14/2021   History of Present Illness The pt is a 76 yo female presenting 8/3 with BLE swelling. Found to have acute CHF exacerbation. PMH includes: lung cancer, COPD, CHF, mitral regurgitation, chronic respiratory failure with hypoxia on 2L O2, CKD III, depression/anxiety, malnutrition, and former tobacco use.   Clinical Impression   Patient continuing to sleep the majority of am and early pm.  Patient admitted for the diagnosis above.  PTA she lives alone in a one level apartment.  Patient stating she occassionally uses a 4WRW at home, but was able to complete the majority of her ADL, iADL at home.  Deficits impacting independence are listed below.  Patient declining out of bed wanting to sleep, she was able to mobilize with PT the prior date, and it appears she is agreeable to SNF for post acute rehab prior to returning home.  OT will follow in the acute setting,and future treatment should wit until the end of the day.        Recommendations for follow up therapy are one component of a multi-disciplinary discharge planning process, led by the attending physician.  Recommendations may be updated based on patient status, additional functional criteria and insurance authorization.   Follow Up Recommendations  Skilled nursing-short term rehab (<3 hours/day)    Assistance Recommended at Discharge Frequent or constant Supervision/Assistance  Patient can return home with the following A lot of help with walking and/or transfers;A lot of help with bathing/dressing/bathroom;Assist for transportation;Direct supervision/assist for financial management;Assistance with cooking/housework;Direct supervision/assist for medications management    Functional Status Assessment  Patient has had a recent decline in their functional status and demonstrates the ability to make significant improvements  in function in a reasonable and predictable amount of time.  Equipment Recommendations  BSC/3in1;Tub/shower seat    Recommendations for Other Services       Precautions / Restrictions Precautions Precautions: Fall Precaution Comments: 2L O2 at baseline Restrictions Weight Bearing Restrictions: No      Mobility Bed Mobility Overal bed mobility: Needs Assistance Bed Mobility: Supine to Sit, Sit to Supine     Supine to sit: Mod assist Sit to supine: Mod assist     Patient Response: Cooperative  Transfers                          Balance Overall balance assessment: Needs assistance Sitting-balance support: No upper extremity supported, Feet supported Sitting balance-Leahy Scale: Good                                     ADL either performed or assessed with clinical judgement   ADL Overall ADL's : Needs assistance/impaired Eating/Feeding: Set up;Bed level   Grooming: Wash/dry hands;Wash/dry face;Set up;Bed level       Lower Body Bathing: Moderate assistance;Bed level   Upper Body Dressing : Minimal assistance;Bed level   Lower Body Dressing: Moderate assistance;Bed level                       Vision Baseline Vision/History: 1 Wears glasses Patient Visual Report: No change from baseline       Perception Perception Perception: Not tested   Praxis Praxis Praxis: Not tested    Pertinent Vitals/Pain Pain Assessment Pain Assessment: No/denies pain     Hand Dominance  Right   Extremity/Trunk Assessment Upper Extremity Assessment Upper Extremity Assessment: Overall WFL for tasks assessed   Lower Extremity Assessment Lower Extremity Assessment: Defer to PT evaluation   Cervical / Trunk Assessment Cervical / Trunk Assessment: Kyphotic   Communication Communication Communication: No difficulties   Cognition Arousal/Alertness: Awake/alert Behavior During Therapy: WFL for tasks assessed/performed Overall Cognitive  Status: Within Functional Limits for tasks assessed                                       General Comments   VSS    Exercises     Shoulder Instructions      Home Living Family/patient expects to be discharged to:: Private residence Living Arrangements: Alone Available Help at Discharge: Family;Available PRN/intermittently Type of Home: Apartment Home Access: Level entry     Home Layout: One level     Bathroom Shower/Tub: Teacher, early years/pre: Standard Bathroom Accessibility: Yes How Accessible: Accessible via walker Home Equipment: Rollator (4 wheels);Grab bars - toilet;Grab bars - tub/shower          Prior Functioning/Environment                 ADLs Comments: No assist with ADL, iADL        OT Problem List: Impaired balance (sitting and/or standing);Decreased activity tolerance;Decreased strength;Decreased safety awareness      OT Treatment/Interventions: Self-care/ADL training;Therapeutic exercise;Patient/family education;Balance training;Therapeutic activities;DME and/or AE instruction    OT Goals(Current goals can be found in the care plan section) Acute Rehab OT Goals Patient Stated Goal: None stated OT Goal Formulation: With patient Time For Goal Achievement: 08/29/21 Potential to Achieve Goals: Fair ADL Goals Pt Will Perform Grooming: with set-up;sitting Pt Will Perform Upper Body Dressing: with set-up;sitting Pt Will Perform Lower Body Dressing: with min assist;sit to/from stand Pt Will Transfer to Toilet: with min assist;stand pivot transfer;bedside commode Pt Will Perform Toileting - Clothing Manipulation and hygiene: with min guard assist;sit to/from stand Pt/caregiver will Perform Home Exercise Program: Increased strength;Both right and left upper extremity;With Supervision  OT Frequency: Min 2X/week    Co-evaluation              AM-PAC OT "6 Clicks" Daily Activity     Outcome Measure Help from another  person eating meals?: None Help from another person taking care of personal grooming?: None Help from another person toileting, which includes using toliet, bedpan, or urinal?: A Lot Help from another person bathing (including washing, rinsing, drying)?: A Lot Help from another person to put on and taking off regular upper body clothing?: A Little Help from another person to put on and taking off regular lower body clothing?: A Lot 6 Click Score: 17   End of Session Nurse Communication: Other (comment)  Activity Tolerance: Patient limited by fatigue Patient left: in bed;with call bell/phone within reach;with bed alarm set  OT Visit Diagnosis: Unsteadiness on feet (R26.81);Muscle weakness (generalized) (M62.81)                Time: 9628-3662 OT Time Calculation (min): 17 min Charges:  OT General Charges $OT Visit: 1 Visit OT Evaluation $OT Eval Moderate Complexity: 1 Mod  08/14/2021  RP, OTR/L  Acute Rehabilitation Services  Office:  [phone]   Metta Clines 08/14/2021, 1:16 PM

## 2021-08-14 NOTE — Progress Notes (Signed)
PROGRESS NOTE    Sherry [name]  POE:[id number] DOB: Jul 21, 1945 DOA: 08/11/2021 PCP: [name], NP  76/F with history of COPD, chronic diastolic CHF, moderate to severe MR, stage III non-small cell lung cancer currently under observation, chronic respiratory failure on 2 to 3 L home O2, CKD 3 AAA, severe malnutrition, depression, anxiety presented to the ED 8/3 with progressive lower extremity edema, dyspnea cough. -In the ED BNP was 1572, troponin 27, creatinine 0.8, chest x-ray noted worsening pulmonary vascular congestion, pulmonary edema and bilateral pleural effusions   Subjective: -Feels cold, breathing is improving, reports abdominal discomfort and constipation  Assessment and Plan:  Acute on chronic diastolic CHF (congestive heart failure) (HCC) -Improving, remains volume overloaded, third spacing from hypoalbuminemia is also contributing  -Last echo 06/21/2021 showed EF 55-60% with G2DD. -Diuresed with IV Lasix and albumin, clinically improving, she is 2.4 L negative, weight down 8 pounds -Transition to oral Lasix today -Increase activity, increase protein intake -TOC consulted for short-term rehab  Constipation, abdominal discomfort -Add laxatives and Dulcolax suppository, monitor -Will need imaging if symptoms worsen  Left lower leg rash, cellulitis -Continue IV doxycycline, multiple antibiotic allergies noted  COPD (chronic obstructive pulmonary disease) (HCC) Chronic respiratory failure with hypoxia Stable without acute exacerbation. -Continue Trelegy and albuterol as needed -Continue home 2 L O2 via [place]  Chronic kidney disease, stage 3a ([place]) -Stable  Stage III squamous cell carcinoma of left lung [facility]) Following with oncology, Dr. Lorna Few.  Completed prior treatment with chemoradiation and immunotherapy.  Currently under observation.  Severe protein calorie malnutrition (HCC) Estimated body mass index is 13.75 kg/m as calculated from the  following:   Height as of this encounter: 5\' 1"  (1.549 m).   Weight as of this encounter: 33 kg.  -Dietitian consulted, continue Ensure  Ethics: Sherry [name] is an extremely frail cachectic 76 year old female with CHF, COPD, chronic respiratory failure, CKD lung cancer and severe malnutrition, I think her prognosis is very poor, discussed CODE STATUS she was agreeable to DNR, seen by palliative care, patient wants to pursue full scope of treatment, rehab  DVT prophylaxis: Lovenox Code Status: DNR Family Communication: No family at bedside, will update today  disposition Plan: Home likely with home health services in 1 to 2 days  Consultants:  Palliative care  Procedures:   Antimicrobials:    Objective: Vitals:   08/13/21 2030 08/14/21 0500 08/14/21 0727 08/14/21 0810  BP: 130/84   122/69  Pulse: 89   (!) 102  Resp: 20   19  Temp: 98.9 F (37.2 C)     TempSrc: Oral     SpO2: 99%  98% 90%  Weight:  31 kg    Height:        Intake/Output Summary (Last 24 hours) at 08/14/2021 0942 Last data filed at 08/14/2021 0335 Gross per 24 hour  Intake 585.08 ml  Output --  Net 585.08 ml   Filed Weights   08/12/21 1356 08/13/21 0420 08/14/21 0500  Weight: 34.2 kg 32.6 kg 31 kg    Examination:  General exam: Frail extremely cachectic female laying in bed, AAOx3, no distress HEENT: No JVD CVS: S1-S2, regular rhythm Lungs: Improving air movement, decreased breath sounds the bases Abdomen: Soft, nontender, bowel sounds present Extremities: Trace edema ,  erythematous petechial rash throughout left lower leg  Psychiatry:  Mood & affect appropriate.     Data Reviewed:   CBC: Recent Labs  Lab 08/11/21 1750 08/11/21 1814 08/11/21 1816 08/12/21 0249  08/13/21 0223  WBC 8.3  --   --  7.4 7.6  NEUTROABS 6.9  --   --   --   --   HGB 11.5* 12.2 12.6 11.4* 10.2*  HCT 39.4 36.0 37.0 38.1 33.5*  MCV 96.8  --   --  94.1 92.8  PLT 176  --   --  207 916   Basic Metabolic  Panel: Recent Labs  Lab 08/11/21 1816 08/11/21 2100 08/12/21 0249 08/13/21 0223 08/14/21 0408  NA 140 143 143 143 142  K 4.0 3.7 3.7 3.7 3.0*  CL 103 99 99 92* 93*  CO2  --  37* 39* 39* 37*  GLUCOSE 73 96 73 78 90  BUN 26* 21 20 18 16   CREATININE 0.80 1.03* 0.91 1.00 0.94  CALCIUM  --  8.4* 8.5* 9.0 8.9  MG  --  2.0 2.0  --   --    GFR: Estimated Creatinine Clearance: 24.9 mL/min (by C-G formula based on SCr of 0.94 mg/dL). Liver Function Tests: Recent Labs  Lab 08/11/21 2100 08/12/21 0249  AST 19 17  ALT 11 11  ALKPHOS 72 66  BILITOT 0.5 0.8  PROT 6.0* 5.5*  ALBUMIN 2.9* 2.7*   No results for input(s): "LIPASE", "AMYLASE" in the last 168 hours. No results for input(s): "AMMONIA" in the last 168 hours. Coagulation Profile: No results for input(s): "INR", "PROTIME" in the last 168 hours. Cardiac Enzymes: No results for input(s): "CKTOTAL", "CKMB", "CKMBINDEX", "TROPONINI" in the last 168 hours. BNP (last 3 results) No results for input(s): "PROBNP" in the last 8760 hours. HbA1C: No results for input(s): "HGBA1C" in the last 72 hours. CBG: No results for input(s): "GLUCAP" in the last 168 hours. Lipid Profile: No results for input(s): "CHOL", "HDL", "LDLCALC", "TRIG", "CHOLHDL", "LDLDIRECT" in the last 72 hours. Thyroid Function Tests: No results for input(s): "TSH", "T4TOTAL", "FREET4", "T3FREE", "THYROIDAB" in the last 72 hours. Anemia Panel: No results for input(s): "VITAMINB12", "FOLATE", "FERRITIN", "TIBC", "IRON", "RETICCTPCT" in the last 72 hours. Urine analysis:    Component Value Date/Time   COLORURINE STRAW (A) 08/12/2021 1046   APPEARANCEUR CLEAR 08/12/2021 1046   LABSPEC 1.006 08/12/2021 1046   PHURINE 8.0 08/12/2021 1046   GLUCOSEU NEGATIVE 08/12/2021 1046   HGBUR SMALL (A) 08/12/2021 1046   BILIRUBINUR NEGATIVE 08/12/2021 1046   KETONESUR NEGATIVE 08/12/2021 1046   PROTEINUR NEGATIVE 08/12/2021 1046   NITRITE NEGATIVE 08/12/2021 1046    LEUKOCYTESUR NEGATIVE 08/12/2021 1046   Sepsis Labs: @LABRCNTIP (procalcitonin:4,lacticidven:4)  )No results found for this or any previous visit (from the past 240 hour(s)).   Radiology Studies: No results found.   Scheduled Meds:  enoxaparin (LOVENOX) injection  30 mg Subcutaneous Q24H   feeding supplement  1 Container Oral BID BM   feeding supplement  237 mL Oral Q24H   fluticasone furoate-vilanterol  1 puff Inhalation Daily   And   umeclidinium bromide  1 puff Inhalation Daily   furosemide  40 mg Oral Daily   pantoprazole  40 mg Oral Q1200   polyethylene glycol  17 g Oral Daily   potassium chloride SA  40 mEq Oral BID   senna-docusate  1 tablet Oral BID   sodium chloride flush  3 mL Intravenous Q12H   Continuous Infusions:  doxycycline (VIBRAMYCIN) IV 100 mg (08/14/21 0925)     LOS: 3 days    Time spent: 53min   Domenic Polite, MD [facility]   08/14/2021, 9:42 AM

## 2021-08-14 NOTE — Plan of Care (Signed)
Progressing toward discharge Problem: Education: Goal: Ability to demonstrate management of disease process will improve Outcome: Progressing Goal: Ability to verbalize understanding of medication therapies will improve Outcome: Progressing Goal: Individualized Educational Video(s) Outcome: Progressing   Problem: Activity: Goal: Capacity to carry out activities will improve Outcome: Progressing   Problem: Cardiac: Goal: Ability to achieve and maintain adequate cardiopulmonary perfusion will improve Outcome: Progressing   Problem: Education: Goal: Knowledge of General Education information will improve Description: Including pain rating scale, medication(s)/side effects and non-pharmacologic comfort measures Outcome: Progressing   Problem: Health Behavior/Discharge Planning: Goal: Ability to manage health-related needs will improve Outcome: Progressing   Problem: Clinical Measurements: Goal: Ability to maintain clinical measurements within normal limits will improve Outcome: Progressing Goal: Will remain free from infection Outcome: Progressing Goal: Diagnostic test results will improve Outcome: Progressing Goal: Respiratory complications will improve Outcome: Progressing Goal: Cardiovascular complication will be avoided Outcome: Progressing   Problem: Activity: Goal: Risk for activity intolerance will decrease Outcome: Progressing   Problem: Nutrition: Goal: Adequate nutrition will be maintained Outcome: Progressing   Problem: Coping: Goal: Level of anxiety will decrease Outcome: Progressing   Problem: Elimination: Goal: Will not experience complications related to bowel motility Outcome: Progressing Goal: Will not experience complications related to urinary retention Outcome: Progressing   Problem: Pain Managment: Goal: General experience of comfort will improve Outcome: Progressing   Problem: Safety: Goal: Ability to remain free from injury will  improve Outcome: Progressing   Problem: Skin Integrity: Goal: Risk for impaired skin integrity will decrease Outcome: Progressing

## 2021-08-14 NOTE — Social Work (Addendum)
                                                                                                                          Re:  Sherry [name] Date of Birth: 10/23/1945 Date: 08/14/2021  To Whom It May Concern:  Please be advised that the above-named patient will require a short-term nursing home stay-anticipated 30 days or less for rehabilitation and strengthening. The plan is to return home.

## 2021-08-15 DIAGNOSIS — I5033 Acute on chronic diastolic (congestive) heart failure: Secondary | ICD-10-CM | POA: Diagnosis not present

## 2021-08-15 LAB — COMPREHENSIVE METABOLIC PANEL
ALT: 10 U/L (ref 0–44)
AST: 14 U/L — ABNORMAL LOW (ref 15–41)
Albumin: 3.6 g/dL (ref 3.5–5.0)
Alkaline Phosphatase: 46 U/L (ref 38–126)
Anion gap: 11 (ref 5–15)
BUN: 18 mg/dL (ref 8–23)
CO2: 39 mmol/L — ABNORMAL HIGH (ref 22–32)
Calcium: 8.9 mg/dL (ref 8.9–10.3)
Chloride: 93 mmol/L — ABNORMAL LOW (ref 98–111)
Creatinine, Ser: 0.92 mg/dL (ref 0.44–1.00)
GFR, Estimated: >60 mL/min (ref >60)
Glucose, Bld: 89 mg/dL (ref 70–99)
Potassium: 3.1 mmol/L — ABNORMAL LOW (ref 3.5–5.1)
Sodium: 143 mmol/L (ref 135–145)
Total Bilirubin: 1.2 mg/dL (ref 0.3–1.2)
Total Protein: 5.7 g/dL — ABNORMAL LOW (ref 6.5–8.1)

## 2021-08-15 LAB — CBC
HCT: 34.1 % — ABNORMAL LOW (ref 36.0–46.0)
Hemoglobin: 10.6 g/dL — ABNORMAL LOW (ref 12.0–15.0)
MCH: 28.1 pg (ref 26.0–34.0)
MCHC: 31.1 g/dL (ref 30.0–36.0)
MCV: 90.5 fL (ref 80.0–100.0)
Platelets: 204 10*3/uL (ref 150–400)
RBC: 3.77 MIL/uL — ABNORMAL LOW (ref 3.87–5.11)
RDW: 18.6 % — ABNORMAL HIGH (ref 11.5–15.5)
WBC: 6.6 10*3/uL (ref 4.0–10.5)
nRBC: 0 % (ref 0.0–0.2)

## 2021-08-15 MED ORDER — DIPHENHYDRAMINE HCL 50 MG/ML IJ SOLN
50.0000 mg | Freq: Once | INTRAMUSCULAR | Status: AC
  Filled 2021-08-15: qty 1

## 2021-08-15 MED ORDER — PREDNISONE 50 MG PO TABS
50.0000 mg | ORAL_TABLET | Freq: Four times a day (QID) | ORAL | Status: AC
  Administered 2021-08-15 (×3): 50 mg via ORAL
  Filled 2021-08-15 (×3): qty 1

## 2021-08-15 MED ORDER — SPIRONOLACTONE 12.5 MG HALF TABLET
12.5000 mg | ORAL_TABLET | Freq: Every day | ORAL | Status: DC
  Administered 2021-08-15 – 2021-08-19 (×5): 12.5 mg via ORAL
  Filled 2021-08-15 (×5): qty 1

## 2021-08-15 MED ORDER — POTASSIUM CHLORIDE CRYS ER 20 MEQ PO TBCR
40.0000 meq | EXTENDED_RELEASE_TABLET | Freq: Two times a day (BID) | ORAL | Status: AC
Start: 2021-08-15 — End: 2021-08-15
  Administered 2021-08-15 (×2): 40 meq via ORAL
  Filled 2021-08-15 (×2): qty 2

## 2021-08-15 MED ORDER — TORSEMIDE 20 MG PO TABS
20.0000 mg | ORAL_TABLET | Freq: Every day | ORAL | Status: DC
  Filled 2021-08-15: qty 1

## 2021-08-15 MED ORDER — DIPHENHYDRAMINE HCL 25 MG PO CAPS
50.0000 mg | ORAL_CAPSULE | Freq: Once | ORAL | Status: AC
  Administered 2021-08-15: 50 mg via ORAL
  Filled 2021-08-15: qty 2

## 2021-08-15 NOTE — Progress Notes (Signed)
Physical Therapy Treatment Patient Details Name: Sherry [name] MRN: [id number] DOB: 05-19-1945 Today's Date: 08/15/2021   History of Present Illness The pt is a 76 yo female presenting 8/3 with BLE swelling. Found to have acute CHF exacerbation. PMH includes: lung cancer, COPD, CHF, mitral regurgitation, chronic respiratory failure with hypoxia on 2L O2, CKD III, depression/anxiety, malnutrition, and former tobacco use.    PT Comments    Pt tolerates treatment well, ambulating for increased distances and transferring multiple times. Pt continues to require physical assistance to mobilize in bed and reports continued weakness compared to baseline. Pt expresses concern over continued fatigue and limited desire to participate in typical ADLs/mobility, continuing to sleep through meal times. Pt will benefit from continued aggressive mobilization and increased time out of bed in an effort to improve arousal. PT continues to recommend SNF at this time due to limited caregiver support at home. If pt is able to demonstrates improved bed mobility as well as the ability to remain awake for meal times and medication administration then she may be able to discharge home.   Recommendations for follow up therapy are one component of a multi-disciplinary discharge planning process, led by the attending physician.  Recommendations may be updated based on patient status, additional functional criteria and insurance authorization.  Follow Up Recommendations  Skilled nursing-short term rehab (<3 hours/day) Can patient physically be transported by private vehicle: Yes   Assistance Recommended at Discharge Frequent or constant Supervision/Assistance  Patient can return home with the following A little help with walking and/or transfers;A little help with bathing/dressing/bathroom;Assistance with cooking/housework;Direct supervision/assist for medications management;Direct supervision/assist for financial management    Equipment Recommendations  None recommended by PT    Recommendations for Other Services       Precautions / Restrictions Precautions Precautions: Fall Precaution Comments: 2L O2 at baseline Restrictions Weight Bearing Restrictions: No     Mobility  Bed Mobility Overal bed mobility: Needs Assistance Bed Mobility: Supine to Sit     Supine to sit: Min assist, HOB elevated     General bed mobility comments: hand hold to pull trunk into sitting    Transfers Overall transfer level: Needs assistance Equipment used: None Transfers: Sit to/from Stand Sit to Stand: Min guard                Ambulation/Gait Ambulation/Gait assistance: Min guard Gait Distance (Feet): 60 Feet (additional trial of 15') Assistive device: None Gait Pattern/deviations: Step-through pattern Gait velocity: reduced Gait velocity interpretation: <1.8 ft/sec, indicate of risk for recurrent falls   General Gait Details: pt with slowed step-through gait   Stairs             Wheelchair Mobility    Modified Rankin (Stroke Patients Only)       Balance Overall balance assessment: Needs assistance Sitting-balance support: No upper extremity supported, Feet supported Sitting balance-Leahy Scale: Good     Standing balance support: No upper extremity supported Standing balance-Leahy Scale: Good                              Cognition Arousal/Alertness: Awake/alert Behavior During Therapy: WFL for tasks assessed/performed Overall Cognitive Status: Within Functional Limits for tasks assessed                                          Exercises  General Comments General comments (skin integrity, edema, etc.): VSS on 2L [place]      Pertinent Vitals/Pain Pain Assessment Pain Assessment: No/denies pain    Home Living                          Prior Function            PT Goals (current goals can now be found in the care plan section)  Acute Rehab PT Goals Patient Stated Goal: return home Progress towards PT goals: Progressing toward goals    Frequency    Min 3X/week      PT Plan Current plan remains appropriate    Co-evaluation              AM-PAC PT "6 Clicks" Mobility   Outcome Measure  Help needed turning from your back to your side while in a flat bed without using bedrails?: A Little Help needed moving from lying on your back to sitting on the side of a flat bed without using bedrails?: A Little Help needed moving to and from a bed to a chair (including a wheelchair)?: A Little Help needed standing up from a chair using your arms (e.g., wheelchair or bedside chair)?: A Little Help needed to walk in hospital room?: A Little Help needed climbing 3-5 steps with a railing? : A Lot 6 Click Score: 17    End of Session   Activity Tolerance: Patient tolerated treatment well Patient left: in chair;with call bell/phone within reach;with chair alarm set Nurse Communication: Mobility status PT Visit Diagnosis: Other abnormalities of gait and mobility (R26.89)     Time: 7680-8811 PT Time Calculation (min) (ACUTE ONLY): 30 min  Charges:  $Gait Training: 8-22 mins $Therapeutic Activity: 8-22 mins                     Zenaida Niece, PT, DPT Acute Rehabilitation Office [phone]    Zenaida Niece 08/15/2021, 11:01 AM

## 2021-08-15 NOTE — Progress Notes (Addendum)
PROGRESS NOTE    Sherry [name]  UTM:[id number] DOB: [birth year] DOA: 08/11/2021 PCP: [name], NP  76/F with history of COPD, chronic diastolic CHF, moderate to severe MR, stage III non-small cell lung cancer currently under observation, chronic respiratory failure on 2 to 3 L home O2, CKD 3 AAA, severe malnutrition, depression, anxiety presented to the ED 8/3 with progressive lower extremity edema, dyspnea cough. -In the ED BNP was 1572, troponin 27, creatinine 0.8, chest x-ray noted worsening pulmonary vascular congestion, pulmonary edema and bilateral pleural effusions -Improving on diuretics, antibiotics for cellulitis  Subjective: -Breathing better, complains of abdominal pain today, got laxatives and had a bowel movement, no nausea or vomiting, eating less  Assessment and Plan:  Acute on chronic diastolic CHF (congestive heart failure) (HCC) -Improving, remains volume overloaded, third spacing from hypoalbuminemia is also contributing  -Last echo 06/21/2021 showed EF 55-60% with G2DD. -Diuresed with IV Lasix and albumin, clinically improving, she is 2.4 L negative, weight down 8 pounds -Change Lasix to torsemide today -Increase activity, increase protein intake -TOC consulted for short-term rehab  Abdominal pain -Did not improve after laxatives or BM -Add PPI, continue laxatives, will obtain CT abdomen pelvis today, history of known stage III lung cancer, history of contrast allergy, will need premedication  Left lower leg rash, cellulitis -Improving on IV doxycycline, multiple antibiotic allergies noted -Changed to p.o. tomorrow  COPD (chronic obstructive pulmonary disease) (HCC) Chronic respiratory failure with hypoxia Stable without acute exacerbation. -Continue Trelegy and albuterol as needed -Continue home 2 L O2 via [place]  Chronic kidney disease, stage 3a ([place]) -Stable  Stage III squamous cell carcinoma of left lung [facility]) Following with oncology, Dr. Lorna Few.  Completed prior treatment with chemoradiation and immunotherapy.  Currently under observation.  Severe protein calorie malnutrition (HCC) Estimated body mass index is 13.75 kg/m as calculated from the following:   Height as of this encounter: 5\' 1"  (1.549 m).   Weight as of this encounter: 33 kg.  -Dietitian consulted, continue Ensure  Ethics: Ms. Bronkema is an extremely frail cachectic 76 year old female with CHF, COPD, chronic respiratory failure, CKD lung cancer and severe malnutrition, I think her prognosis is very poor, discussed CODE STATUS she was agreeable to DNR, seen by palliative care, patient wants to pursue full scope of treatment, rehab  DVT prophylaxis: Lovenox Code Status: DNR Family Communication: No family at bedside, called and updated son 8/6 disposition Plan: SNF likely 48 hours if stable  Consultants:  Palliative care  Procedures:   Antimicrobials:    Objective: Vitals:   08/14/21 1931 08/15/21 0544 08/15/21 0804 08/15/21 1148  BP: 116/67 118/69  113/79  Pulse: 93 82  87  Resp: 20 17  16   Temp: 98.5 F (36.9 C) 98.5 F (36.9 C)  98.5 F (36.9 C)  TempSrc: Oral Oral  Oral  SpO2: 97% 97% 97% 98%  Weight:  31.5 kg    Height:        Intake/Output Summary (Last 24 hours) at 08/15/2021 1202 Last data filed at 08/14/2021 1800 Gross per 24 hour  Intake 240 ml  Output 300 ml  Net -60 ml   Filed Weights   08/13/21 0420 08/14/21 0500 08/15/21 0544  Weight: 32.6 kg 31 kg 31.5 kg    Examination:  General exam: Frail cachectic chronically ill female laying in bed, AAOx3, no distress HEENT: No JVD CVS: S1-S2, regular rhythm Lungs: Improved air movement, diminished at the bases Abdomen: Soft, mildly distended, mild tenderness,  bowel sounds present Extremities: Trace edema, erythematous petechial rash throughout left lower leg  Psychiatry:  Mood & affect appropriate.     Data Reviewed:   CBC: Recent Labs  Lab 08/11/21 1750 08/11/21 1814  08/11/21 1816 08/12/21 0249 08/13/21 0223 08/15/21 0420  WBC 8.3  --   --  7.4 7.6 6.6  NEUTROABS 6.9  --   --   --   --   --   HGB 11.5* 12.2 12.6 11.4* 10.2* 10.6*  HCT 39.4 36.0 37.0 38.1 33.5* 34.1*  MCV 96.8  --   --  94.1 92.8 90.5  PLT 176  --   --  207 175 017   Basic Metabolic Panel: Recent Labs  Lab 08/11/21 2100 08/12/21 0249 08/13/21 0223 08/14/21 0408 08/15/21 0420  NA 143 143 143 142 143  K 3.7 3.7 3.7 3.0* 3.1*  CL 99 99 92* 93* 93*  CO2 37* 39* 39* 37* 39*  GLUCOSE 96 73 78 90 89  BUN 21 20 18 16 18   CREATININE 1.03* 0.91 1.00 0.94 0.92  CALCIUM 8.4* 8.5* 9.0 8.9 8.9  MG 2.0 2.0  --   --   --    GFR: Estimated Creatinine Clearance: 25.9 mL/min (by C-G formula based on SCr of 0.92 mg/dL). Liver Function Tests: Recent Labs  Lab 08/11/21 2100 08/12/21 0249 08/15/21 0420  AST 19 17 14*  ALT 11 11 10   ALKPHOS 72 66 46  BILITOT 0.5 0.8 1.2  PROT 6.0* 5.5* 5.7*  ALBUMIN 2.9* 2.7* 3.6   No results for input(s): "LIPASE", "AMYLASE" in the last 168 hours. No results for input(s): "AMMONIA" in the last 168 hours. Coagulation Profile: No results for input(s): "INR", "PROTIME" in the last 168 hours. Cardiac Enzymes: No results for input(s): "CKTOTAL", "CKMB", "CKMBINDEX", "TROPONINI" in the last 168 hours. BNP (last 3 results) No results for input(s): "PROBNP" in the last 8760 hours. HbA1C: No results for input(s): "HGBA1C" in the last 72 hours. CBG: No results for input(s): "GLUCAP" in the last 168 hours. Lipid Profile: No results for input(s): "CHOL", "HDL", "LDLCALC", "TRIG", "CHOLHDL", "LDLDIRECT" in the last 72 hours. Thyroid Function Tests: No results for input(s): "TSH", "T4TOTAL", "FREET4", "T3FREE", "THYROIDAB" in the last 72 hours. Anemia Panel: No results for input(s): "VITAMINB12", "FOLATE", "FERRITIN", "TIBC", "IRON", "RETICCTPCT" in the last 72 hours. Urine analysis:    Component Value Date/Time   COLORURINE STRAW (A) 08/12/2021 1046    APPEARANCEUR CLEAR 08/12/2021 1046   LABSPEC 1.006 08/12/2021 1046   PHURINE 8.0 08/12/2021 1046   GLUCOSEU NEGATIVE 08/12/2021 1046   HGBUR SMALL (A) 08/12/2021 1046   BILIRUBINUR NEGATIVE 08/12/2021 1046   KETONESUR NEGATIVE 08/12/2021 1046   PROTEINUR NEGATIVE 08/12/2021 1046   NITRITE NEGATIVE 08/12/2021 1046   LEUKOCYTESUR NEGATIVE 08/12/2021 1046   Sepsis Labs: @LABRCNTIP (procalcitonin:4,lacticidven:4)  )No results found for this or any previous visit (from the past 240 hour(s)).   Radiology Studies: No results found.   Scheduled Meds:  diphenhydrAMINE  50 mg Oral Once   Or   diphenhydrAMINE  50 mg Intravenous Once   doxycycline  100 mg Oral Q12H   enoxaparin (LOVENOX) injection  30 mg Subcutaneous Q24H   feeding supplement  1 Container Oral BID BM   feeding supplement  237 mL Oral Q24H   fluticasone furoate-vilanterol  1 puff Inhalation Daily   And   umeclidinium bromide  1 puff Inhalation Daily   furosemide  40 mg Oral Daily   pantoprazole  40 mg  Oral Q1200   polyethylene glycol  17 g Oral Daily   potassium chloride SA  40 mEq Oral BID   predniSONE  50 mg Oral Q6H   senna-docusate  1 tablet Oral BID   sodium chloride flush  3 mL Intravenous Q12H   spironolactone  12.5 mg Oral Daily   Continuous Infusions:   LOS: 4 days    Time spent: 53min   Domenic Polite, MD [facility]   08/15/2021, 12:02 PM

## 2021-08-15 NOTE — TOC Progression Note (Addendum)
Transition of Care [facility]) - Progression Note    Patient Details  Name: Sherry [name] MRN: [id number] Date of Birth: 1945-12-07  Transition of Care [facility]) CM/SW [place], [place] Phone Number: 08/15/2021, 12:32 PM  Clinical Narrative:     CSW met with pt and provided bed offers. Pt chooses [place].  [facility] liaison confirmed bed offer.   PASRR Received: [id number]  1330: Auth request submitted in [facility] portal [id number]. Auth status: pending  Expected Discharge Plan: [place] Barriers to Discharge: Continued Medical Work up, Orthoptist and Services Expected Discharge Plan: [place] In-house Referral: Clinical Social Work     Living arrangements for the past 2 months: Apartment                                       Social Determinants of Health (SDOH) Interventions    Readmission Risk Interventions     No data to display

## 2021-08-15 NOTE — Progress Notes (Addendum)
Per CT representative, provide pre-medication for contrast dye at 2300 as ordered so that contrast can be administered at 0000 8/8.  Individual of unknown relationship to patient arrived to nurses station stating that patient is a regular at one particular [employer]; when she does not appear at this store the individual claims to have made arrangements such that delivery would be made. RN unable to account for full contents of order, however the individual showed a hard spicy beef taco, a bowl of unknown contents, and a small tuna sub with a large, 32oz beverage. Patient has order for regular diet with no fluid restriction; without sufficient reason to deny patient this order, individual was sent to room with food.

## 2021-08-16 ENCOUNTER — Inpatient Hospital Stay (HOSPITAL_COMMUNITY): Payer: Medicare Other

## 2021-08-16 DIAGNOSIS — I5033 Acute on chronic diastolic (congestive) heart failure: Secondary | ICD-10-CM | POA: Diagnosis not present

## 2021-08-16 LAB — BASIC METABOLIC PANEL
Anion gap: 8 (ref 5–15)
BUN: 26 mg/dL — ABNORMAL HIGH (ref 8–23)
CO2: 36 mmol/L — ABNORMAL HIGH (ref 22–32)
Calcium: 9.4 mg/dL (ref 8.9–10.3)
Chloride: 99 mmol/L (ref 98–111)
Creatinine, Ser: 0.94 mg/dL (ref 0.44–1.00)
GFR, Estimated: >60 mL/min (ref >60)
Glucose, Bld: 118 mg/dL — ABNORMAL HIGH (ref 70–99)
Potassium: 4.4 mmol/L (ref 3.5–5.1)
Sodium: 143 mmol/L (ref 135–145)

## 2021-08-16 LAB — CBC
HCT: 34.2 % — ABNORMAL LOW (ref 36.0–46.0)
Hemoglobin: 10.4 g/dL — ABNORMAL LOW (ref 12.0–15.0)
MCH: 27.7 pg (ref 26.0–34.0)
MCHC: 30.4 g/dL (ref 30.0–36.0)
MCV: 91 fL (ref 80.0–100.0)
Platelets: 230 10*3/uL (ref 150–400)
RBC: 3.76 MIL/uL — ABNORMAL LOW (ref 3.87–5.11)
RDW: 18.8 % — ABNORMAL HIGH (ref 11.5–15.5)
WBC: 5.6 10*3/uL (ref 4.0–10.5)
nRBC: 0 % (ref 0.0–0.2)

## 2021-08-16 MED ORDER — FUROSEMIDE 10 MG/ML IJ SOLN
20.0000 mg | Freq: Two times a day (BID) | INTRAMUSCULAR | Status: AC
Start: 2021-08-16 — End: 2021-08-16
  Administered 2021-08-16 (×2): 20 mg via INTRAVENOUS
  Filled 2021-08-16 (×2): qty 2

## 2021-08-16 MED ORDER — DOXYCYCLINE HYCLATE 100 MG PO TABS
100.0000 mg | ORAL_TABLET | Freq: Two times a day (BID) | ORAL | Status: AC
  Administered 2021-08-16 – 2021-08-18 (×4): 100 mg via ORAL
  Filled 2021-08-16 (×4): qty 1

## 2021-08-16 MED ORDER — IOHEXOL 300 MG/ML  SOLN
100.0000 mL | Freq: Once | INTRAMUSCULAR | Status: AC | PRN
  Administered 2021-08-16: 80 mL via INTRAVENOUS

## 2021-08-16 MED ORDER — ALBUMIN HUMAN 25 % IV SOLN
25.0000 g | Freq: Four times a day (QID) | INTRAVENOUS | Status: AC
  Administered 2021-08-16 (×2): 25 g via INTRAVENOUS
  Filled 2021-08-16 (×2): qty 100

## 2021-08-16 MED ORDER — TAMSULOSIN HCL 0.4 MG PO CAPS
0.4000 mg | ORAL_CAPSULE | Freq: Every day | ORAL | Status: DC
  Administered 2021-08-16 – 2021-08-19 (×4): 0.4 mg via ORAL
  Filled 2021-08-16 (×4): qty 1

## 2021-08-16 NOTE — TOC Progression Note (Signed)
Transition of Care [facility]) - Progression Note    Patient Details  Name: Sherry [name] MRN: [id number] Date of Birth: 1945/06/05  Transition of Care [facility]) CM/SW [place], [place] Phone Number: 08/16/2021, 2:57 PM  Clinical Narrative:     CSW is notified by provider that SNF Sherry [name] is denied after peer review. CSW updated pt. Discussed option to go to SNF under LTC medicaid; pt would not like to do this as it would require her to turn over her SS check. She would like to return home with [facility]. She states she had her brother helping her at home almost daily though that he has had a care accident and unable to assist for next 6 weeks. Her daughter works Biochemist, clinical and her son lives in [place]. Pt is interested in [facility] services through medicaid. She is agreeable to [facility] services and has no preference on agency. She provided daughters contact info: Sherry [name] [phone].Marland [phone].   CSW contacted Daughter Sherry [name] and updated her. Sherry [name] is interested in as much support as pt can get at home. 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 She could help transport at home but would need to be in the evening when she is off of work. Sherry [name] requests update from MD; CSW agreed to notify MD.   CSW completed [facility] application and will submit once signed by provider.   Expected Discharge Plan: Skilled Nursing Facility Barriers to Discharge: Continued Medical Work up, Ship broker  Expected Discharge Plan and Services Expected Discharge Plan: [place] In-house Referral: Clinical Social Work     Living arrangements for the past 2 months: Apartment                                       Social Determinants of Health (SDOH) Interventions    Readmission Risk Interventions     No data to display

## 2021-08-16 NOTE — Progress Notes (Signed)
Mobility Specialist - Progress Note   08/16/21 1417  Mobility  Activity Ambulated with assistance in hallway  Level of Assistance Other (Comment) (HHA)  Distance Ambulated (ft) 200 ft  Activity Response Tolerated well  $Mobility charge 1 Mobility   Pt was received in bed and agreeable to mobility. No c/o pain throughout. Pt was left EOB with all needs met.   Larey Seat

## 2021-08-16 NOTE — TOC Progression Note (Signed)
Transition of Care [facility]) - Progression Note    Patient Details  Name: Sherry [name] MRN: [id number] Date of Birth: [birth year]  Transition of Care [facility]) CM/SW Contact  Zenon Mayo, RN Phone Number: 08/16/2021, 4:04 PM  Clinical Narrative:    NCM received notification that patient will need [facility] services set up to go home, SNF was denied by insurance.  CSW  offered choice, she has no preference.  NCM made referral to [facility] with [facility], awaiting to hear back for HHRN, HHPT, HHAIDE.  Also CSW is working on [employer] forms for MD to sign.  Daughter states that she will be transporting patient home at dc. Patient lives alone. Daughter lives in [place] and she works,  her brother lives out of town.     Expected Discharge Plan: Skilled Nursing Facility Barriers to Discharge: Continued Medical Work up, Ship broker  Expected Discharge Plan and Services Expected Discharge Plan: [place] In-house Referral: Clinical Social Work     Living arrangements for the past 2 months: Apartment                                       Social Determinants of Health (SDOH) Interventions    Readmission Risk Interventions     No data to display

## 2021-08-16 NOTE — Progress Notes (Signed)
PROGRESS NOTE    Sherry [name]  QMV:[id number] DOB: 30-Aug-1945 DOA: 08/11/2021 PCP: [name], NP  76/F with history of COPD, chronic diastolic CHF, moderate to severe MR, stage III non-small cell lung cancer currently under observation, chronic respiratory failure on 2 to 3 L home O2, CKD 3 AAA, severe malnutrition, depression, anxiety presented to the ED 8/3 with progressive lower extremity edema, dyspnea cough. -In the ED BNP was 1572, troponin 27, creatinine 0.8, chest x-ray noted worsening pulmonary vascular congestion, pulmonary edema and bilateral pleural effusions -Improving on diuretics, antibiotics for cellulitis  Subjective: -Breathing better, complains of abdominal pain today, got laxatives and had a bowel movement, no nausea or vomiting, eating less  Assessment and Plan:  Acute on chronic diastolic CHF (congestive heart failure) ([place]) yes -Improving, remains volume overloaded, third spacing from hypoalbuminemia is also contributing  -Last echo 06/21/2021 showed EF 55-60% with G2DD. -Diuresed with IV Lasix and albumin, clinically improving, she is 3 L negative, weight down 8 pounds -Still with pleural effusions and ascites on imaging yesterday, will repeat IV Lasix with albumin today -Increase activity, increase protein intake -Hopeful for SNF for short-term rehab  Urinary retention -Add Flomax, I/O cath this morning was retaining over 999 mL -Monitor for recurrent retention  Left lower leg rash, cellulitis -Improving on IV doxycycline, multiple antibiotic allergies noted -Changed to p.o.   COPD (chronic obstructive pulmonary disease) (HCC) Chronic respiratory failure with hypoxia Stable without acute exacerbation. -Continue Trelegy and albuterol as needed -Continue home 2 L O2 via [place]  Chronic kidney disease, stage 3a ([place]) -Stable  Stage III squamous cell carcinoma of left lung [facility]) Following with oncology, Dr. Lorna Few.  Completed prior treatment  with chemoradiation and immunotherapy.  Currently under observation.  Severe protein calorie malnutrition (HCC) Estimated body mass index is 13.75 kg/m as calculated from the following:   Height as of this encounter: 5\' 1"  (1.549 m).   Weight as of this encounter: 33 kg.  -Dietitian consulted, continue Ensure  Ethics: Ms. Ellenberger is an extremely frail cachectic 76 year old female with CHF, COPD, chronic respiratory failure, CKD lung cancer and severe malnutrition, I think her prognosis is very poor, discussed CODE STATUS she was agreeable to DNR, seen by palliative care, patient wants to pursue full scope of treatment, rehab  DVT prophylaxis: Lovenox Code Status: DNR Family Communication: No family at bedside, called and updated son 8/6 disposition Plan: SNF likely 48 hours if stable  Consultants:  Palliative care  Procedures:   Antimicrobials:    Objective: Vitals:   08/15/21 2024 08/16/21 0331 08/16/21 0738 08/16/21 1024  BP: 130/81 (!) 141/89    Pulse:  97    Resp: 20 20    Temp: 99.1 F (37.3 C) 98.5 F (36.9 C)  98.3 F (36.8 C)  TempSrc: Oral Oral  Oral  SpO2:  98% 98% 90%  Weight:      Height:        Intake/Output Summary (Last 24 hours) at 08/16/2021 1244 Last data filed at 08/16/2021 1000 Gross per 24 hour  Intake 240 ml  Output 1930 ml  Net -1690 ml   Filed Weights   08/13/21 0420 08/14/21 0500 08/15/21 0544  Weight: 32.6 kg 31 kg 31.5 kg    Examination:  General exam: Frail cachectic chronically ill female sitting up in bed, AAOx3 HEENT: Positive JVD CVS: S1-S2, regular rhythm Lungs: Decreased breath sounds the bases, improving air movement Abdomen: Soft, nontender, mildly distended, bowel sounds present Extremities:  Trace edema, improving erythematous petechial rash throughout left lower leg  Psychiatry:  Mood & affect appropriate.     Data Reviewed:   CBC: Recent Labs  Lab 08/11/21 1750 08/11/21 1814 08/11/21 1816 08/12/21 0249  08/13/21 0223 08/15/21 0420 08/16/21 0725  WBC 8.3  --   --  7.4 7.6 6.6 5.6  NEUTROABS 6.9  --   --   --   --   --   --   HGB 11.5*   < > 12.6 11.4* 10.2* 10.6* 10.4*  HCT 39.4   < > 37.0 38.1 33.5* 34.1* 34.2*  MCV 96.8  --   --  94.1 92.8 90.5 91.0  PLT 176  --   --  207 175 204 230   < > = values in this interval not displayed.   Basic Metabolic Panel: Recent Labs  Lab 08/11/21 2100 08/12/21 0249 08/13/21 0223 08/14/21 0408 08/15/21 0420 08/16/21 0725  NA 143 143 143 142 143 143  K 3.7 3.7 3.7 3.0* 3.1* 4.4  CL 99 99 92* 93* 93* 99  CO2 37* 39* 39* 37* 39* 36*  GLUCOSE 96 73 78 90 89 118*  BUN 21 20 18 16 18  26*  CREATININE 1.03* 0.91 1.00 0.94 0.92 0.94  CALCIUM 8.4* 8.5* 9.0 8.9 8.9 9.4  MG 2.0 2.0  --   --   --   --    GFR: Estimated Creatinine Clearance: 25.3 mL/min (by C-G formula based on SCr of 0.94 mg/dL). Liver Function Tests: Recent Labs  Lab 08/11/21 2100 08/12/21 0249 08/15/21 0420  AST 19 17 14*  ALT 11 11 10   ALKPHOS 72 66 46  BILITOT 0.5 0.8 1.2  PROT 6.0* 5.5* 5.7*  ALBUMIN 2.9* 2.7* 3.6   No results for input(s): "LIPASE", "AMYLASE" in the last 168 hours. No results for input(s): "AMMONIA" in the last 168 hours. Coagulation Profile: No results for input(s): "INR", "PROTIME" in the last 168 hours. Cardiac Enzymes: No results for input(s): "CKTOTAL", "CKMB", "CKMBINDEX", "TROPONINI" in the last 168 hours. BNP (last 3 results) No results for input(s): "PROBNP" in the last 8760 hours. HbA1C: No results for input(s): "HGBA1C" in the last 72 hours. CBG: No results for input(s): "GLUCAP" in the last 168 hours. Lipid Profile: No results for input(s): "CHOL", "HDL", "LDLCALC", "TRIG", "CHOLHDL", "LDLDIRECT" in the last 72 hours. Thyroid Function Tests: No results for input(s): "TSH", "T4TOTAL", "FREET4", "T3FREE", "THYROIDAB" in the last 72 hours. Anemia Panel: No results for input(s): "VITAMINB12", "FOLATE", "FERRITIN", "TIBC", "IRON",  "RETICCTPCT" in the last 72 hours. Urine analysis:    Component Value Date/Time   COLORURINE STRAW (A) 08/12/2021 1046   APPEARANCEUR CLEAR 08/12/2021 1046   LABSPEC 1.006 08/12/2021 1046   PHURINE 8.0 08/12/2021 1046   GLUCOSEU NEGATIVE 08/12/2021 1046   HGBUR SMALL (A) 08/12/2021 1046   BILIRUBINUR NEGATIVE 08/12/2021 1046   KETONESUR NEGATIVE 08/12/2021 1046   PROTEINUR NEGATIVE 08/12/2021 1046   NITRITE NEGATIVE 08/12/2021 1046   LEUKOCYTESUR NEGATIVE 08/12/2021 1046   Sepsis Labs: @LABRCNTIP (procalcitonin:4,lacticidven:4)  )No results found for this or any previous visit (from the past 240 hour(s)).   Radiology Studies: CT ABDOMEN PELVIS W CONTRAST  Result Date: 08/16/2021 CLINICAL DATA:  Acute abdominal pain. EXAM: CT ABDOMEN AND PELVIS WITH CONTRAST TECHNIQUE: Multidetector CT imaging of the abdomen and pelvis was performed using the standard protocol following bolus administration of intravenous contrast. RADIATION DOSE REDUCTION: This exam was performed according to the departmental dose-optimization program which includes automated exposure  control, adjustment of the mA and/or kV according to patient size and/or use of iterative reconstruction technique. CONTRAST:  73mL OMNIPAQUE IOHEXOL 300 MG/ML  SOLN Patient was pre-medicated for for contrast allergy. COMPARISON:  PET CT 08/03/2017 FINDINGS: Lower chest: Moderate bilateral pleural effusions. Left pleural effusion is partially loculated. Possible complete atelectasis of the left lower lobe. Emphysema at the lung bases. Hepatobiliary: No focal liver abnormality is seen. No gallstones, gallbladder wall thickening, or biliary dilatation. Pancreas: No ductal dilatation or inflammation. Spleen: Normal in size without focal abnormality. Adrenals/Urinary Tract: No adrenal nodule. There is mild symmetric bilateral hydronephrosis. No evidence of urolithiasis. Prominently distended urinary bladder. Bladder volume = 859 cm^3. No bladder wall  thickening. Stomach/Bowel: Bowel assessment is limited in the absence of enteric contrast and paucity of intra-abdominal fat. Small to moderate hiatal hernia. Decompressed stomach. Occasional fluid-filled small bowel without obstruction or inflammation. Moderate volume of colonic stool. No obvious colonic inflammation. Vascular/Lymphatic: Aortic atherosclerosis. No aneurysm. Patent portal vein. No bulky adenopathy. Reproductive: Atrophic uterus.  No obvious adnexal mass. Other: Small volume abdominopelvic ascites. Paucity of intra-abdominal and subcutaneous fat suggest cachexia. There is diffuse edema of the subcutaneous and intra-abdominal fat. Musculoskeletal: Bony under mineralization. Mild L4 compression deformity with less than 50% loss of height centrally. Lower thoracic compression deformities with vertebral augmentation. IMPRESSION: 1. Prominently distended urinary bladder with mild symmetric bilateral hydronephrosis, suspicious for urinary retention. 2. Moderate bilateral pleural effusions. Left pleural effusion is partially loculated. Suspected complete atelectasis of the left lower lobe. 3. Small volume abdominopelvic ascites and diffuse edema of the subcutaneous and intra-abdominal fat. 4. Small to moderate-sized hiatal hernia Aortic Atherosclerosis (ICD10-I70.0) and Emphysema (ICD10-J43.9). Electronically Signed   By: Keith Rake M.D.   On: 08/16/2021 00:59     Scheduled Meds:  doxycycline  100 mg Oral Q12H   enoxaparin (LOVENOX) injection  30 mg Subcutaneous Q24H   feeding supplement  1 Container Oral BID BM   feeding supplement  237 mL Oral Q24H   fluticasone furoate-vilanterol  1 puff Inhalation Daily   And   umeclidinium bromide  1 puff Inhalation Daily   furosemide  20 mg Intravenous BID   pantoprazole  40 mg Oral Q1200   polyethylene glycol  17 g Oral Daily   senna-docusate  1 tablet Oral BID   sodium chloride flush  3 mL Intravenous Q12H   spironolactone  12.5 mg Oral Daily    tamsulosin  0.4 mg Oral Daily   Continuous Infusions:  albumin human 25 g (08/16/21 1029)    LOS: 5 days    Time spent: 26min   Domenic Polite, MD [facility]   08/16/2021, 12:44 PM

## 2021-08-16 NOTE — TOC Transition Note (Signed)
Transition of Care [facility]) - CM/SW Discharge Note   Patient Details  Name: Sherry [name] MRN: [id number] Date of Birth: 13-Feb-1945  [facility] CM/SW Contact:  Zenon Mayo, RN Phone Number: 08/16/2021, 4:14 PM   Clinical Narrative:    NCM received notification that patient will need [facility] services set up to go home, SNF was denied by insurance.  CSW  offered choice, she has no preference.  NCM made referral to [facility] with [facility], awaiting to hear back for HHRN, HHPT, HHAIDE.  Also CSW is working on [employer] forms for MD to sign.  Daughter states that she will be transporting patient home at dc. Patient lives alone. Daughter lives in [place] and she works,  her brother lives out of town.  Caryl Pina with [facility] states they can take referral.  Soc will begin 24 to 48 hrs post dc.       Final next level of care: [place] Barriers to Discharge: No Barriers Identified   Patient Goals and CMS Choice Patient states their goals for this hospitalization and ongoing recovery are:: return home with [facility] CMS Medicare.gov Compare Post Acute Care list provided to:: Patient Choice offered to / list presented to : Patient  Discharge Placement                       Discharge Plan and Services In-house Referral: Clinical Social Work                DME Agency: NA       HH Arranged: RN, Disease Management, PT, OT, Nurse's Aide [place] Agency: [place] ([facility]) Date HH Agency Contacted: 08/16/21 Time [place]: [place] Representative spoke with at [place]: [place] ([place]) Interventions     Readmission Risk Interventions     No data to display

## 2021-08-16 NOTE — Progress Notes (Signed)
Occupational Therapy Treatment Patient Details Name: Sherry [name] MRN: [id number] DOB: [birth year] Today's Date: 08/16/2021   History of present illness The pt is a 76 yo female presenting 8/3 with BLE swelling. Found to have acute CHF exacerbation. PMH includes: lung cancer, COPD, CHF, mitral regurgitation, chronic respiratory failure with hypoxia on 2L O2, CKD III, depression/anxiety, malnutrition, and former tobacco use.   OT comments  Patient received in supine and agreeable to OT session. Patient was min assist to get to EOB and was able to perform mobility in room without an assistive device. Patient was able to stand at sink to perform grooming without UE support and change socks while seated in recliner. Patient instructed in BUE strengthening exercises with yellow therapy band. Patient making good progress and will continue to be followed by acute OT.    Recommendations for follow up therapy are one component of a multi-disciplinary discharge planning process, led by the attending physician.  Recommendations may be updated based on patient status, additional functional criteria and insurance authorization.    Follow Up Recommendations  Skilled nursing-short term rehab (<3 hours/day)    Assistance Recommended at Discharge Frequent or constant Supervision/Assistance  Patient can return home with the following  Assist for transportation;Direct supervision/assist for financial management;Assistance with cooking/housework;Direct supervision/assist for medications management;A little help with walking and/or transfers;A little help with bathing/dressing/bathroom   Equipment Recommendations  BSC/3in1;Tub/shower seat    Recommendations for Other Services      Precautions / Restrictions Precautions Precautions: Fall Precaution Comments: 2L O2 at baseline Restrictions Weight Bearing Restrictions: No       Mobility Bed Mobility Overal bed mobility: Needs Assistance Bed Mobility:  Supine to Sit     Supine to sit: HOB elevated, Min assist     General bed mobility comments: hand hold to pull trunk into sitting    Transfers Overall transfer level: Needs assistance Equipment used: None Transfers: Sit to/from Stand Sit to Stand: Min guard           General transfer comment: min guard for transfer with verbal cues for safety due to lines     Balance Overall balance assessment: Needs assistance Sitting-balance support: No upper extremity supported, Feet supported Sitting balance-Leahy Scale: Good     Standing balance support: No upper extremity supported Standing balance-Leahy Scale: Good Standing balance comment: stood at sink for self care tasks without support                           ADL either performed or assessed with clinical judgement   ADL Overall ADL's : Needs assistance/impaired     Grooming: Wash/dry hands;Wash/dry face;Oral care;Min guard;Standing Grooming Details (indicate cue type and reason): stood at sink without an assistive device             Lower Body Dressing: Supervision/safety;Sitting/lateral leans Lower Body Dressing Details (indicate cue type and reason): changed socks while seated in recliner               General ADL Comments: improvement with grooming and LB dressing    Extremity/Trunk Assessment              Vision       Perception     Praxis      Cognition Arousal/Alertness: Awake/alert Behavior During Therapy: WFL for tasks assessed/performed Overall Cognitive Status: Within Functional Limits for tasks assessed  General Comments: alert and eager to participate        Exercises Exercises: General Upper Extremity General Exercises - Upper Extremity Shoulder Flexion: Strengthening, 10 reps, Seated, Theraband Theraband Level (Shoulder Flexion): Level 1 (Yellow) Shoulder ABduction: Strengthening, 15 reps, Both, Seated,  Theraband Theraband Level (Shoulder Abduction): Level 1 (Yellow) Elbow Flexion: Strengthening, Both, 15 reps, Seated, Theraband Theraband Level (Elbow Flexion): Level 1 (Yellow) Elbow Extension: Strengthening, Both, 15 reps, Seated, Theraband Theraband Level (Elbow Extension): Level 1 (Yellow)    Shoulder Instructions       General Comments      Pertinent Vitals/ Pain       Pain Assessment Pain Assessment: No/denies pain  Home Living                                          Prior Functioning/Environment              Frequency  Min 2X/week        Progress Toward Goals  OT Goals(current goals can now be found in the care plan section)  Progress towards OT goals: Progressing toward goals  Acute Rehab OT Goals Patient Stated Goal: get better OT Goal Formulation: With patient Time For Goal Achievement: 08/29/21 Potential to Achieve Goals: Fair ADL Goals Pt Will Perform Grooming: with set-up;sitting Pt Will Perform Upper Body Dressing: with set-up;sitting Pt Will Perform Lower Body Dressing: with min assist;sit to/from stand Pt Will Transfer to Toilet: with min assist;stand pivot transfer;bedside commode Pt Will Perform Toileting - Clothing Manipulation and hygiene: with min guard assist;sit to/from stand Pt/caregiver will Perform Home Exercise Program: Increased strength;Both right and left upper extremity;With Supervision  Plan Discharge plan remains appropriate    Co-evaluation                 AM-PAC OT "6 Clicks" Daily Activity     Outcome Measure   Help from another person eating meals?: None Help from another person taking care of personal grooming?: None Help from another person toileting, which includes using toliet, bedpan, or urinal?: A Lot Help from another person bathing (including washing, rinsing, drying)?: A Little Help from another person to put on and taking off regular upper body clothing?: A Little Help from another  person to put on and taking off regular lower body clothing?: A Little 6 Click Score: 19    End of Session Equipment Utilized During Treatment: Oxygen  OT Visit Diagnosis: Unsteadiness on feet (R26.81);Muscle weakness (generalized) (M62.81)   Activity Tolerance Patient tolerated treatment well   Patient Left in chair;with call bell/phone within reach;with chair alarm set   Nurse Communication Mobility status        Time: 2878-6767 OT Time Calculation (min): 32 min  Charges: OT General Charges $OT Visit: 1 Visit OT Treatments $Self Care/Home Management : 8-22 mins $Therapeutic Exercise: 8-22 mins  Lodema Hong, OTA Acute Rehabilitation Services  Office [phone]   Trixie Dredge 08/16/2021, 12:40 PM

## 2021-08-16 NOTE — Care Management Important Message (Signed)
Important Message  Patient Details  Name: Sherry [name] MRN: [id number] Date of Birth: April 10, 1945   Medicare Important Message Given:  Yes     Shelda Altes 08/16/2021, 9:53 AM

## 2021-08-16 NOTE — TOC Progression Note (Signed)
Transition of Care [facility]) - Progression Note    Patient Details  Name: Sherry [name] MRN: [id number] Date of Birth: 07-26-1945  Transition of Care [facility]) CM/SW [place], [place] Phone Number: 08/16/2021, 10:23 AM  Clinical Narrative:     Received voicemail from [place]; pt's Josem Kaufmann is going to peer review. Provider Will need to call [phone] and select option 5. Due by 1230p today. MD notified  Expected Discharge Plan: [place] Barriers to Discharge: Continued Medical Work up, Ship broker  Expected Discharge Plan and Services Expected Discharge Plan: [place] In-house Referral: Clinical Social Work     Living arrangements for the past 2 months: Apartment                                       Social Determinants of Health (SDOH) Interventions    Readmission Risk Interventions     No data to display

## 2021-08-17 ENCOUNTER — Encounter (HOSPITAL_COMMUNITY): Payer: Self-pay | Admitting: Internal Medicine

## 2021-08-17 DIAGNOSIS — I5033 Acute on chronic diastolic (congestive) heart failure: Secondary | ICD-10-CM | POA: Diagnosis not present

## 2021-08-17 LAB — CBC
HCT: 30.3 % — ABNORMAL LOW (ref 36.0–46.0)
Hemoglobin: 9.2 g/dL — ABNORMAL LOW (ref 12.0–15.0)
MCH: 27.9 pg (ref 26.0–34.0)
MCHC: 30.4 g/dL (ref 30.0–36.0)
MCV: 91.8 fL (ref 80.0–100.0)
Platelets: 215 10*3/uL (ref 150–400)
RBC: 3.3 MIL/uL — ABNORMAL LOW (ref 3.87–5.11)
RDW: 19 % — ABNORMAL HIGH (ref 11.5–15.5)
WBC: 5.3 10*3/uL (ref 4.0–10.5)
nRBC: 0 % (ref 0.0–0.2)

## 2021-08-17 LAB — BASIC METABOLIC PANEL
Anion gap: 8 (ref 5–15)
BUN: 30 mg/dL — ABNORMAL HIGH (ref 8–23)
CO2: 35 mmol/L — ABNORMAL HIGH (ref 22–32)
Calcium: 9.1 mg/dL (ref 8.9–10.3)
Chloride: 100 mmol/L (ref 98–111)
Creatinine, Ser: 0.82 mg/dL (ref 0.44–1.00)
GFR, Estimated: >60 mL/min (ref >60)
Glucose, Bld: 81 mg/dL (ref 70–99)
Potassium: 3.5 mmol/L (ref 3.5–5.1)
Sodium: 143 mmol/L (ref 135–145)

## 2021-08-17 MED ORDER — POTASSIUM CHLORIDE CRYS ER 20 MEQ PO TBCR
40.0000 meq | EXTENDED_RELEASE_TABLET | Freq: Once | ORAL | Status: AC
  Administered 2021-08-17: 40 meq via ORAL
  Filled 2021-08-17: qty 2

## 2021-08-17 MED ORDER — TORSEMIDE 20 MG PO TABS
20.0000 mg | ORAL_TABLET | Freq: Every day | ORAL | Status: DC
  Administered 2021-08-17 – 2021-08-19 (×3): 20 mg via ORAL
  Filled 2021-08-17 (×3): qty 1

## 2021-08-17 NOTE — Progress Notes (Signed)
Physical Therapy Treatment Patient Details Name: Sherry [name] MRN: [id number] DOB: [birth year] Today's Date: 08/17/2021   History of Present Illness The pt is a 76 yo female presenting 8/3 with BLE swelling. Found to have acute CHF exacerbation. PMH includes: lung cancer, COPD, CHF, mitral regurgitation, chronic respiratory failure with hypoxia on 2L O2, CKD III, depression/anxiety, malnutrition, and former tobacco use.    PT Comments    Pt tolerates therapy well today, ambulating limited community distances without an AD. Pt takes one prolonged standing rest break during ambulation, stating that her legs feel weak and she is anxious. Continued therapy will assist the pt in improving activity tolerance for continued progression towards her baseline level of function and to maximize safety with mobility.    Recommendations for follow up therapy are one component of a multi-disciplinary discharge planning process, led by the attending physician.  Recommendations may be updated based on patient status, additional functional criteria and insurance authorization.  Follow Up Recommendations  Home health PT Can patient physically be transported by private vehicle: Yes   Assistance Recommended at Discharge Intermittent Supervision/Assistance  Patient can return home with the following A little help with walking and/or transfers;A little help with bathing/dressing/bathroom;Assistance with cooking/housework;Direct supervision/assist for medications management;Direct supervision/assist for financial management   Equipment Recommendations  None recommended by PT    Recommendations for Other Services       Precautions / Restrictions Precautions Precautions: Fall Precaution Comments: 2L O2 at baseline Restrictions Weight Bearing Restrictions: No     Mobility  Bed Mobility                    Transfers Overall transfer level: Needs assistance Equipment used: None Transfers: Sit  to/from Stand Sit to Stand: Supervision                Ambulation/Gait Ambulation/Gait assistance: Supervision Gait Distance (Feet): 300 Feet (Additional trial of 200') Assistive device: None Gait Pattern/deviations: Step-through pattern, Decreased stride length Gait velocity: reduced Gait velocity interpretation: <1.8 ft/sec, indicate of risk for recurrent falls   General Gait Details: pt with slowed step-through gait   Stairs             Wheelchair Mobility    Modified Rankin (Stroke Patients Only)       Balance Overall balance assessment: Mild deficits observed, not formally tested                                          Cognition Arousal/Alertness: Awake/alert Behavior During Therapy: WFL for tasks assessed/performed Overall Cognitive Status: Within Functional Limits for tasks assessed                                          Exercises      General Comments General comments (skin integrity, edema, etc.): VSS on 2L O2      Pertinent Vitals/Pain Pain Assessment Pain Assessment: Faces Faces Pain Scale: Hurts a little bit Pain Location: stomach Pain Descriptors / Indicators: Aching Pain Intervention(s): Monitored during session    Home Living                          Prior Function  PT Goals (current goals can now be found in the care plan section) Acute Rehab PT Goals Patient Stated Goal: return home PT Goal Formulation: With patient Time For Goal Achievement: 08/27/21 Potential to Achieve Goals: Good Progress towards PT goals: Progressing toward goals    Frequency    Min 3X/week      PT Plan Discharge plan needs to be updated    Co-evaluation              AM-PAC PT "6 Clicks" Mobility   Outcome Measure  Help needed turning from your back to your side while in a flat bed without using bedrails?: None Help needed moving from lying on your back to sitting on the  side of a flat bed without using bedrails?: None Help needed moving to and from a bed to a chair (including a wheelchair)?: A Little Help needed standing up from a chair using your arms (e.g., wheelchair or bedside chair)?: A Little Help needed to walk in hospital room?: A Little Help needed climbing 3-5 steps with a railing? : A Little 6 Click Score: 20    End of Session Equipment Utilized During Treatment: Gait belt;Oxygen Activity Tolerance: Patient tolerated treatment well Patient left: in chair;with call bell/phone within reach Nurse Communication: Mobility status PT Visit Diagnosis: Other abnormalities of gait and mobility (R26.89)     Time: 8786-7672 PT Time Calculation (min) (ACUTE ONLY): 23 min  Charges:  $Gait Training: 23-37 mins                     Hall Busing, SPT Acute Rehabilitation Office #: [phone]    Hall Busing 08/17/2021, 4:42 PM

## 2021-08-17 NOTE — Progress Notes (Signed)
Medicaid PCS form completed and signed. Faxed to [employer]. At [phone]

## 2021-08-17 NOTE — Progress Notes (Signed)
PROGRESS NOTE    Sherry [name]  FBP:[id number] DOB: [birth year] DOA: 08/11/2021 PCP: [name], NP  75/F with history of COPD, chronic diastolic CHF, moderate to severe MR, stage III non-small cell lung cancer currently under observation, chronic respiratory failure on 2 to 3 L home O2, CKD 3 AAA, severe malnutrition, depression, anxiety presented to the ED 8/3 with progressive lower extremity edema, dyspnea cough. -In the ED BNP was 1572, troponin 27, creatinine 0.8, chest x-ray noted worsening pulmonary vascular congestion, pulmonary edema and bilateral pleural effusions -Improving on diuretics, antibiotics for cellulitis -Seen by palliative care, DNR, declines hospice at this time  Subjective: -Only voided a small amount this morning, breathing better, denies abdominal pain or discomfort  Assessment and Plan:  Acute on chronic diastolic CHF (congestive heart failure) (Sherry [name]) yes -Improving, remains volume overloaded, third spacing from hypoalbuminemia is also contributing  -Last echo 06/21/2021 showed EF 55-60% with G2DD. -Diuresed with IV Lasix and albumin, clinically improving, she is 3.8 L negative, weight down 8 pounds -Will switch to oral torsemide today -Increase activity, increase protein intake -Declined by insurance for SNF will need maximal home health services  Urinary retention -Add Flomax, required I/O cath yesterday -Repeat bladder scan today, may need Foley if she has recurrent retention  Left lower leg rash, cellulitis -Improving on IV doxycycline, multiple antibiotic allergies noted -Changed to p.o.   COPD (chronic obstructive pulmonary disease) (HCC) Chronic respiratory failure with hypoxia Stable without acute exacerbation. -Continue Trelegy and albuterol as needed -Continue home 2 L O2 via [place]  Chronic kidney disease, stage 3a ([place]) -Stable  Stage III squamous cell carcinoma of left lung [facility]) Following with oncology, Dr. Lorna [name].  Completed  prior treatment with chemoradiation and immunotherapy.  Currently under observation.  Severe protein calorie malnutrition (HCC) Estimated body mass index is 13.75 kg/m as calculated from the following:   Height as of this encounter: 5\' 1"  (1.549 m).   Weight as of this encounter: 33 kg.  -Dietitian consulted, continue Ensure  Ethics: Ms. Sherry [name] is an extremely frail cachectic 76 year old female with CHF, COPD, chronic respiratory failure, CKD lung cancer and severe malnutrition, I think her prognosis is very poor, discussed CODE STATUS she was agreeable to DNR, seen by palliative care, patient wants to pursue full scope of treatment, rehab  DVT prophylaxis: Lovenox Code Status: DNR Family Communication: No family at bedside, called and updated son 8/6 disposition Plan: SNF likely 48 hours if stable  Consultants:  Palliative care  Procedures:   Antimicrobials:    Objective: Vitals:   08/16/21 1918 08/17/21 0454 08/17/21 0843 08/17/21 1015  BP: (!) 108/59 109/68  (!) 93/50  Pulse: 98 83  98  Resp: 17 16  18   Temp: 98.4 F (36.9 C) 98.2 F (36.8 C)  97.9 F (36.6 C)  TempSrc: Oral Axillary  Oral  SpO2: 92% 99% 96% 98%  Weight:  32.2 kg    Height:        Intake/Output Summary (Last 24 hours) at 08/17/2021 1142 Last data filed at 08/17/2021 0900 Gross per 24 hour  Intake 820 ml  Output 600 ml  Net 220 ml   Filed Weights   08/14/21 0500 08/15/21 0544 08/17/21 0454  Weight: 31 kg 31.5 kg 32.2 kg    Examination:  General exam: Extremely frail cachectic chronically ill female sitting up in bed, AAOx3 HEENT: Positive JVD CVS: S1-S2, regular rhythm Lungs: Poor air movement bilaterally, decreased at the bases Abdomen: Soft, nontender, less  distended, bowel sounds present Extremities: No edema,  improving erythematous petechial rash throughout left lower leg  Psychiatry:  Mood & affect appropriate.     Data Reviewed:   CBC: Recent Labs  Lab 08/11/21 1750  08/11/21 1814 08/12/21 0249 08/13/21 0223 08/15/21 0420 08/16/21 0725 08/17/21 0348  WBC 8.3  --  7.4 7.6 6.6 5.6 5.3  NEUTROABS 6.9  --   --   --   --   --   --   HGB 11.5*   < > 11.4* 10.2* 10.6* 10.4* 9.2*  HCT 39.4   < > 38.1 33.5* 34.1* 34.2* 30.3*  MCV 96.8  --  94.1 92.8 90.5 91.0 91.8  PLT 176  --  207 175 204 230 215   < > = values in this interval not displayed.   Basic Metabolic Panel: Recent Labs  Lab 08/11/21 2100 08/12/21 0249 08/13/21 0223 08/14/21 0408 08/15/21 0420 08/16/21 0725 08/17/21 0348  NA 143 143 143 142 143 143 143  K 3.7 3.7 3.7 3.0* 3.1* 4.4 3.5  CL 99 99 92* 93* 93* 99 100  CO2 37* 39* 39* 37* 39* 36* 35*  GLUCOSE 96 73 78 90 89 118* 81  BUN 21 20 18 16 18  26* 30*  CREATININE 1.03* 0.91 1.00 0.94 0.92 0.94 0.82  CALCIUM 8.4* 8.5* 9.0 8.9 8.9 9.4 9.1  MG 2.0 2.0  --   --   --   --   --    GFR: Estimated Creatinine Clearance: 29.7 mL/min (by C-G formula based on SCr of 0.82 mg/dL). Liver Function Tests: Recent Labs  Lab 08/11/21 2100 08/12/21 0249 08/15/21 0420  AST 19 17 14*  ALT 11 11 10   ALKPHOS 72 66 46  BILITOT 0.5 0.8 1.2  PROT 6.0* 5.5* 5.7*  ALBUMIN 2.9* 2.7* 3.6   No results for input(s): "LIPASE", "AMYLASE" in the last 168 hours. No results for input(s): "AMMONIA" in the last 168 hours. Coagulation Profile: No results for input(s): "INR", "PROTIME" in the last 168 hours. Cardiac Enzymes: No results for input(s): "CKTOTAL", "CKMB", "CKMBINDEX", "TROPONINI" in the last 168 hours. BNP (last 3 results) No results for input(s): "PROBNP" in the last 8760 hours. HbA1C: No results for input(s): "HGBA1C" in the last 72 hours. CBG: No results for input(s): "GLUCAP" in the last 168 hours. Lipid Profile: No results for input(s): "CHOL", "HDL", "LDLCALC", "TRIG", "CHOLHDL", "LDLDIRECT" in the last 72 hours. Thyroid Function Tests: No results for input(s): "TSH", "T4TOTAL", "FREET4", "T3FREE", "THYROIDAB" in the last 72  hours. Anemia Panel: No results for input(s): "VITAMINB12", "FOLATE", "FERRITIN", "TIBC", "IRON", "RETICCTPCT" in the last 72 hours. Urine analysis:    Component Value Date/Time   COLORURINE STRAW (A) 08/12/2021 1046   APPEARANCEUR CLEAR 08/12/2021 1046   LABSPEC 1.006 08/12/2021 1046   PHURINE 8.0 08/12/2021 1046   GLUCOSEU NEGATIVE 08/12/2021 1046   HGBUR SMALL (A) 08/12/2021 1046   BILIRUBINUR NEGATIVE 08/12/2021 1046   KETONESUR NEGATIVE 08/12/2021 1046   PROTEINUR NEGATIVE 08/12/2021 1046   NITRITE NEGATIVE 08/12/2021 1046   LEUKOCYTESUR NEGATIVE 08/12/2021 1046   Sepsis Labs: @LABRCNTIP (procalcitonin:4,lacticidven:4)  )No results found for this or any previous visit (from the past 240 hour(s)).   Radiology Studies: CT ABDOMEN PELVIS W CONTRAST  Result Date: 08/16/2021 CLINICAL DATA:  Acute abdominal pain. EXAM: CT ABDOMEN AND PELVIS WITH CONTRAST TECHNIQUE: Multidetector CT imaging of the abdomen and pelvis was performed using the standard protocol following bolus administration of intravenous contrast. RADIATION DOSE REDUCTION: This  exam was performed according to the departmental dose-optimization program which includes automated exposure control, adjustment of the mA and/or kV according to patient size and/or use of iterative reconstruction technique. CONTRAST:  67mL OMNIPAQUE IOHEXOL 300 MG/ML  SOLN Patient was pre-medicated for for contrast allergy. COMPARISON:  PET CT 08/03/2017 FINDINGS: Lower chest: Moderate bilateral pleural effusions. Left pleural effusion is partially loculated. Possible complete atelectasis of the left lower lobe. Emphysema at the lung bases. Hepatobiliary: No focal liver abnormality is seen. No gallstones, gallbladder wall thickening, or biliary dilatation. Pancreas: No ductal dilatation or inflammation. Spleen: Normal in size without focal abnormality. Adrenals/Urinary Tract: No adrenal nodule. There is mild symmetric bilateral hydronephrosis. No evidence  of urolithiasis. Prominently distended urinary bladder. Bladder volume = 859 cm^3. No bladder wall thickening. Stomach/Bowel: Bowel assessment is limited in the absence of enteric contrast and paucity of intra-abdominal fat. Small to moderate hiatal hernia. Decompressed stomach. Occasional fluid-filled small bowel without obstruction or inflammation. Moderate volume of colonic stool. No obvious colonic inflammation. Vascular/Lymphatic: Aortic atherosclerosis. No aneurysm. Patent portal vein. No bulky adenopathy. Reproductive: Atrophic uterus.  No obvious adnexal mass. Other: Small volume abdominopelvic ascites. Paucity of intra-abdominal and subcutaneous fat suggest cachexia. There is diffuse edema of the subcutaneous and intra-abdominal fat. Musculoskeletal: Bony under mineralization. Mild L4 compression deformity with less than 50% loss of height centrally. Lower thoracic compression deformities with vertebral augmentation. IMPRESSION: 1. Prominently distended urinary bladder with mild symmetric bilateral hydronephrosis, suspicious for urinary retention. 2. Moderate bilateral pleural effusions. Left pleural effusion is partially loculated. Suspected complete atelectasis of the left lower lobe. 3. Small volume abdominopelvic ascites and diffuse edema of the subcutaneous and intra-abdominal fat. 4. Small to moderate-sized hiatal hernia Aortic Atherosclerosis (ICD10-I70.0) and Emphysema (ICD10-J43.9). Electronically Signed   By: Keith Rake M.D.   On: 08/16/2021 00:59     Scheduled Meds:  doxycycline  100 mg Oral Q12H   enoxaparin (LOVENOX) injection  30 mg Subcutaneous Q24H   feeding supplement  1 Container Oral BID BM   feeding supplement  237 mL Oral Q24H   fluticasone furoate-vilanterol  1 puff Inhalation Daily   And   umeclidinium bromide  1 puff Inhalation Daily   pantoprazole  40 mg Oral Q1200   polyethylene glycol  17 g Oral Daily   senna-docusate  1 tablet Oral BID   sodium chloride flush   3 mL Intravenous Q12H   spironolactone  12.5 mg Oral Daily   tamsulosin  0.4 mg Oral Daily   Continuous Infusions:    LOS: 6 days    Time spent: 65min   Domenic Polite, MD [facility]   08/17/2021, 11:42 AM

## 2021-08-18 DIAGNOSIS — J449 Chronic obstructive pulmonary disease, unspecified: Secondary | ICD-10-CM | POA: Diagnosis not present

## 2021-08-18 DIAGNOSIS — N1831 Chronic kidney disease, stage 3a: Secondary | ICD-10-CM | POA: Diagnosis not present

## 2021-08-18 DIAGNOSIS — L03116 Cellulitis of left lower limb: Secondary | ICD-10-CM | POA: Diagnosis present

## 2021-08-18 DIAGNOSIS — C3492 Malignant neoplasm of unspecified part of left bronchus or lung: Secondary | ICD-10-CM

## 2021-08-18 DIAGNOSIS — E440 Moderate protein-calorie malnutrition: Secondary | ICD-10-CM

## 2021-08-18 DIAGNOSIS — I5033 Acute on chronic diastolic (congestive) heart failure: Secondary | ICD-10-CM | POA: Diagnosis not present

## 2021-08-18 DIAGNOSIS — L03115 Cellulitis of right lower limb: Secondary | ICD-10-CM

## 2021-08-18 LAB — BASIC METABOLIC PANEL
Anion gap: 6 (ref 5–15)
BUN: 37 mg/dL — ABNORMAL HIGH (ref 8–23)
CO2: 39 mmol/L — ABNORMAL HIGH (ref 22–32)
Calcium: 9.1 mg/dL (ref 8.9–10.3)
Chloride: 97 mmol/L — ABNORMAL LOW (ref 98–111)
Creatinine, Ser: 1.1 mg/dL — ABNORMAL HIGH (ref 0.44–1.00)
GFR, Estimated: 52 mL/min — ABNORMAL LOW (ref >60)
Glucose, Bld: 85 mg/dL (ref 70–99)
Potassium: 4.1 mmol/L (ref 3.5–5.1)
Sodium: 142 mmol/L (ref 135–145)

## 2021-08-18 NOTE — Assessment & Plan Note (Addendum)
Patient has been treated with doxycycline with good toleration. Antibiotic therapy transitioned to oral with good toleration and she completed therapy during her hospitalization.

## 2021-08-18 NOTE — Progress Notes (Signed)
Daily Progress Note   Patient Name: Sherry [name]       Date: 08/18/2021 DOB: 25-Nov-1945  Age: 76 y.o. MRN#: [id number] Attending Physician: Tawni Millers Primary Care Physician: [name], NP Admit Date: 08/11/2021  Reason for Consultation/Follow-up: Establishing goals of care  Subjective: Medical records reviewed including progress notes, labs. Patient assessed at the bedside.  She is sitting on the edge of the bed, eating lunch.  No family present during my visit.  Reviewed interval history since our last conversation.  Answered her questions regarding Foley catheter placement and plans for ongoing care based on primary attending's note today.  She also has questions regarding frequency of home health visits.  Answered to the best of my ability, encouraged to continue conversation with TOC.  A MOST form was introduced and extensive conversation was had, covering concepts specific to code status, artifical feeding and hydration, continued IV antibiotics and rehospitalization.  She would like to review later with her daughter and complete as an outpatient.  She confirms that she remains interested in outpatient palliative care referral, although she is uncertain whether this has been set up yet.  Reviewed goals of care and documented in [place].  She is looking forward to getting home, getting well, eating better, and celebrating her birthday next month.  Questions and concerns addressed. PMT will continue to support holistically.   Length of Stay: 7   Physical Exam Vitals and nursing note reviewed.  Constitutional:      General: She is not in acute distress.    Appearance: She is cachectic.     Interventions: Nasal cannula in place.  Cardiovascular:     Rate and Rhythm:  Normal rate.  Pulmonary:     Effort: Pulmonary effort is normal.  Skin:    General: Skin is warm and dry.  Neurological:     Mental Status: Mental status is at baseline.  Psychiatric:        Mood and Affect: Mood normal.        Behavior: Behavior normal.             Vital Signs: BP 110/65 (BP Location: Left Arm)   Pulse 88   Temp 98.7 F (37.1 C) (Oral)   Resp 20   Ht 5\' 1"  (1.549 m)   Wt 32  kg   SpO2 93%   BMI 13.33 kg/m  SpO2: SpO2: 93 % O2 Device: O2 Device: Nasal Cannula O2 Flow Rate: O2 Flow Rate (L/min): 2 L/min      Palliative Assessment/Data: 40 to 50%     Palliative Care Assessment & Plan   Patient Profile: 76 y.o. female  with past medical history of COPD, chronic diastolic CHF (EF 94-50%), moderate-severe mitral regurgitation, stage III left-sided non-small cell lung cancer (s/p concurrent chemoradiation followed by immunotherapy, now under observation), chronic respiratory failure with hypoxia on 2 L O2 via [place], CKD stage IIIa, depression/anxiety, malnutrition, and former tobacco use admitted on 08/11/2021 with progressive lower extremity edema.    Patient admitted with acute on chronic diastolic CHF and has several chronic comorbidities including severe malnutrition with high risk for decompensation. PMT has been consulted to assist with goals of care conversation.  Assessment: Acute on chronic diastolic heart failure, improving CKD 3A Chronic respiratory failure with hypoxia, improving Severe malnutrition COPD Stage III lung cancer Left leg cellulitis, improving Goals of care conversation  Recommendations/Plan: Continue DNR Continue current care Patient defers completion of MOST form today, GOC documented in [place]  Patient remains agreeable to outpatient palliative care referral, goal is to return home with home health as SNF was denied by insurance Psychosocial emotional support provided PMT will continue to follow and support as  needed   Prognosis: Guarded to poor given nutritional and functional decline with several chronic comorbidities and recurrent hospitalizations  Discharge Planning: To Be Determined  Care plan was discussed with patient  MDM high         Sherman Donaldson Johnnette Litter, PA-C  Palliative Medicine Team Team phone # [phone]  Thank you for allowing the Palliative Medicine Team to assist in the care of this patient. Please utilize secure chat with additional questions, if there is no response within 30 minutes please call the above phone number.  Palliative Medicine Team providers are available by phone from 7am to 7pm daily and can be reached through the team cell phone.  Should this patient require assistance outside of these hours, please call the patient's attending physician.

## 2021-08-18 NOTE — Progress Notes (Signed)
C/o abdominal pain, severe in nature which "woke [her] out of a dead sleep". Bladder tender to palpation with no urinary output over duration of shift. Bladder scanner unavailable at time of assessment to confirm findings; given symptoms and known ongoing urinary retention I&O performed. 835mL urine obtained to relief of patient.  Patient stated that she does not wish to receive a foley catheter citing concerns for infection. Discussed risks and benefits to foley catheterization in the context of acute urinary retention. No foley catheter is present at this time, however patient meets criteria if retention continues.

## 2021-08-18 NOTE — Progress Notes (Signed)
CSW  updated pt's daughter regarding PCS application and provided contact info for [employer].

## 2021-08-18 NOTE — Progress Notes (Signed)
Mobility Specialist Progress Note:   08/18/21 1524  Mobility  Activity Ambulated with assistance in hallway  Level of Assistance Modified independent, requires aide device or extra time  Assistive Device None  Distance Ambulated (ft) 200 ft  Activity Response Tolerated well  $Mobility charge 1 Mobility   Pt received in chair willing to participate in mobility. No complaints of pain. Left in bed with call bell in reach and all needs met.   [facility] Mobility Specialist

## 2021-08-18 NOTE — Progress Notes (Signed)
Occupational Therapy Treatment Patient Details Name: Sherry [name] MRN: [id number] DOB: 08-26-1945 Today's Date: 08/18/2021   History of present illness The pt is a 76 yo female presenting 8/3 with BLE swelling. Found to have acute CHF exacerbation. PMH includes: lung cancer, COPD, CHF, mitral regurgitation, chronic respiratory failure with hypoxia on 2L O2, CKD III, depression/anxiety, malnutrition, and former tobacco use.   OT comments  Patient received in bed and was reluctant to get out of bed due to new catheter. Patient was min assist to get to EOB and min guard to transfer to recliner due to pain and lines. Patient performed grooming and UE HEP while seated in recliner with verbal cues to perform correctly. Acute OT to continue to follow.    Recommendations for follow up therapy are one component of a multi-disciplinary discharge planning process, led by the attending physician.  Recommendations may be updated based on patient status, additional functional criteria and insurance authorization.    Follow Up Recommendations  Skilled nursing-short term rehab (<3 hours/day)    Assistance Recommended at Discharge Frequent or constant Supervision/Assistance  Patient can return home with the following  Assist for transportation;Direct supervision/assist for financial management;Assistance with cooking/housework;Direct supervision/assist for medications management;A little help with walking and/or transfers;A little help with bathing/dressing/bathroom   Equipment Recommendations  BSC/3in1;Tub/shower seat    Recommendations for Other Services      Precautions / Restrictions Precautions Precautions: Fall Precaution Comments: 2L O2 at baseline Restrictions Weight Bearing Restrictions: No       Mobility Bed Mobility Overal bed mobility: Needs Assistance Bed Mobility: Supine to Sit     Supine to sit: HOB elevated, Min assist     General bed mobility comments: pulled to side of  bed with hand hold    Transfers Overall transfer level: Needs assistance Equipment used: None Transfers: Sit to/from Stand Sit to Stand: Min guard           General transfer comment: min guard due to patient complaints of pain     Balance Overall balance assessment: Mild deficits observed, not formally tested Sitting-balance support: No upper extremity supported, Feet supported Sitting balance-Leahy Scale: Good     Standing balance support: No upper extremity supported Standing balance-Leahy Scale: Good                             ADL either performed or assessed with clinical judgement   ADL Overall ADL's : Needs assistance/impaired     Grooming: Wash/dry hands;Wash/dry face;Set up;Sitting Grooming Details (indicate cue type and reason): in recliner                               General ADL Comments: grooming seated in recliner due to complaints of pain    Extremity/Trunk Assessment              Vision       Perception     Praxis      Cognition Arousal/Alertness: Awake/alert Behavior During Therapy: WFL for tasks assessed/performed Overall Cognitive Status: Within Functional Limits for tasks assessed                                          Exercises Exercises: General Upper Extremity General Exercises - Upper Extremity Shoulder Flexion: Strengthening, 10  reps, Seated, Theraband Theraband Level (Shoulder Flexion): Level 1 (Yellow) Shoulder ABduction: Strengthening, 15 reps, Both, Seated, Theraband Theraband Level (Shoulder Abduction): Level 1 (Yellow) Elbow Flexion: Strengthening, Both, 15 reps, Seated, Theraband Theraband Level (Elbow Flexion): Level 1 (Yellow) Elbow Extension: Strengthening, Both, 15 reps, Seated, Theraband Theraband Level (Elbow Extension): Level 1 (Yellow)    Shoulder Instructions       General Comments      Pertinent Vitals/ Pain       Pain Assessment Pain Assessment:  Faces Faces Pain Scale: Hurts little more Pain Location: stomach and new catheter Pain Descriptors / Indicators: Aching Pain Intervention(s): Limited activity within patient's tolerance, Monitored during session, Repositioned  Home Living                                          Prior Functioning/Environment              Frequency  Min 2X/week        Progress Toward Goals  OT Goals(current goals can now be found in the care plan section)  Progress towards OT goals: Progressing toward goals  Acute Rehab OT Goals Patient Stated Goal: get better OT Goal Formulation: With patient Time For Goal Achievement: 08/29/21 Potential to Achieve Goals: Fair ADL Goals Pt Will Perform Grooming: with set-up;sitting Pt Will Perform Upper Body Dressing: with set-up;sitting Pt Will Perform Lower Body Dressing: with min assist;sit to/from stand Pt Will Transfer to Toilet: with min assist;stand pivot transfer;bedside commode Pt Will Perform Toileting - Clothing Manipulation and hygiene: with min guard assist;sit to/from stand Pt/caregiver will Perform Home Exercise Program: Increased strength;Both right and left upper extremity;With Supervision  Plan Discharge plan remains appropriate    Co-evaluation                 AM-PAC OT "6 Clicks" Daily Activity     Outcome Measure   Help from another person eating meals?: None Help from another person taking care of personal grooming?: None Help from another person toileting, which includes using toliet, bedpan, or urinal?: A Lot Help from another person bathing (including washing, rinsing, drying)?: A Little Help from another person to put on and taking off regular upper body clothing?: A Little Help from another person to put on and taking off regular lower body clothing?: A Little 6 Click Score: 19    End of Session Equipment Utilized During Treatment: Oxygen  OT Visit Diagnosis: Unsteadiness on feet  (R26.81);Muscle weakness (generalized) (M62.81)   Activity Tolerance Patient tolerated treatment well;Patient limited by pain   Patient Left in chair;with call bell/phone within reach;with chair alarm set   Nurse Communication Mobility status        Time: 1749-4496 OT Time Calculation (min): 17 min  Charges: OT General Charges $OT Visit: 1 Visit OT Treatments $Therapeutic Exercise: 8-22 mins  Lodema Hong, [place]  Office [place] 08/18/2021, 2:39 PM

## 2021-08-18 NOTE — Progress Notes (Signed)
Nutrition Follow-up  DOCUMENTATION CODES:   Underweight  INTERVENTION:  Continue Regular diet Discontinue Ensure Enlive and Magic Cup Increase Boost Breeze po from BID to TID, each supplement provides 250 kcal and 9 grams of protein Order snacks BID  NUTRITION DIAGNOSIS:   Severe Malnutrition related to chronic illness (CHF, COPD, NSCLC) as evidenced by severe fat depletion, severe muscle depletion.  GOAL:   Patient will meet greater than or equal to 90% of their needs  Progressing- addressing via meals, nutrition supplements and snacks  MONITOR:   PO intake, Supplement acceptance, Labs, Weight trends, I & O's, Diet advancement  REASON FOR ASSESSMENT:   Consult Assessment of nutrition requirement/status  ASSESSMENT:   Pt admitted with progressive lower extremity edema r/t acute on chronic CHF. PMH significant for COPD, chronic diastolic CHF, moderate to severe MR, stage III non-small cell lung cancer currently under observation, chronic respiratory failure, CKD 3, AAA, severe malnutrition, depression, anxiety.  PMT continues to follow. Continue DNR with current care. Patient agreeable to outpatient palliative care referral. Plans to return [facility] with [facility].   Pt resting at time of visit. She awoke to shoulder tap. She reports eating most of her meals as long as they are not cold. Asking to have her tomato soup from lunch tray reheated. She does not enjoy Ensure or the [employer] she is receiving. She is drinking Librarian, academic and enjoys these supplements. Discussed adding snacks daily to optimize her nutritional intake and prevent hunger between dinner at 6P and breakfast. Obtained preferences and placed order in [facility].  Meal completions: 8/7: 20%-breakfast, 100%-lunch, 100%-dinner 8/8: 75%-lunch, 75%-dinner 8/10: 50%-breakfast  Reviewed wt history. Admit wt 33 kg Current wt 32 kg  Medications: protonix, miralax, senna, torsemide  Labs: BUN 37, Cr 1.10, GFR  52  NUTRITION - FOCUSED PHYSICAL EXAM:  Flowsheet Row Most Recent Value  Orbital Region Severe depletion  Upper Arm Region Severe depletion  Thoracic and Lumbar Region Severe depletion  Buccal Region Severe depletion  Temple Region Severe depletion  Clavicle Bone Region Severe depletion  Clavicle and Acromion Bone Region Severe depletion  Scapular Bone Region Severe depletion  Dorsal Hand Severe depletion  Patellar Region Severe depletion  Anterior Thigh Region Severe depletion  Posterior Calf Region Severe depletion  Edema (RD Assessment) None  Hair Reviewed  Eyes Reviewed  Mouth Reviewed  Skin Reviewed  Nails Reviewed      Diet Order:   Diet Order             Diet regular Room service appropriate? Yes; Fluid consistency: Thin  Diet effective now                   EDUCATION NEEDS:   No education needs have been identified at this time  Skin:  Skin Assessment: Reviewed RN Assessment  Last BM:  8/9 (type 4)  Height:   Ht Readings from Last 1 Encounters:  08/12/21 5\' 1"  (1.549 m)    Weight:   Wt Readings from Last 1 Encounters:  08/18/21 32 kg   BMI:  Body mass index is 13.33 kg/m.  Estimated Nutritional Needs:   Kcal:  1400-1600  Protein:  55-70g  Fluid:  1.4-1.6L  Clayborne Dana, RDN, LDN Clinical Nutrition

## 2021-08-18 NOTE — Progress Notes (Addendum)
Progress Note   Patient: Sherry [name] ZOX:[id number] DOB: [birth year] DOA: 08/11/2021     7 DOS: the patient was seen and examined on 08/18/2021   Brief hospital course: LENOX LADOUCEUR was admitted to the hospital with the working diagnosis of heart failure decompensation.   76 y.o. female with medical history significant for COPD, chronic diastolic CHF (EF 81-19%), moderate-severe mitral regurgitation, stage III left-sided non-small cell lung cancer (s/p concurrent chemoradiation followed by immunotherapy, now under observation), chronic respiratory failure with hypoxia on 2 L O2 via [place], CKD stage IIIa, depression/anxiety, malnutrition, and former tobacco use who presented with progressive lower extremity edema. Reported 4 weeks of worsening lower extremity edema, and intermittent dyspnea. On her initial physical examination her blood pressure was 126/114, HR 101, RR 16, and 02 saturation 100% on 2 L/min. Lungs with decreased breath sounds bilaterally, heart with S1 and S2 present and rhythmic, abdomen not distended, positive lower extremity edema. Left lower extremity erythematous rash on her right lower extremity.   Na 143, K 3,7 Cl 99, bicarbonate 37, glucose 96 bun 21 cr 1,0  High sensitive troponin 27 and 30  Wbc 7,4 hgb 11,4 plt 207  Urine analysis with SG 1,006, negative leukocytes.   Chest radiograph with hyperinflation, bilateral interstitial infiltrates, predominant at bases, with bilateral loculated pleural effusions.   EKG 95 bpm, normal axis, normal intervals, sinus rhythm, with no significant ST segment or T wave changes.   Patient was placed on IV furosemide for diuresis.  Transitioned to oral toresemide Patient had declined SNF and decided to go home with home health services.  She was noted to have urinary retention.   Assessment and Plan: * Acute on chronic diastolic CHF (congestive heart failure) (HCC) Echocardiogram with preserved LV systolic function 55 to 14%, with  mild reduction in RV systolic function. RVSP 54,2 mmHg. Moderate to severe mitral valve regurgitation. Mild to moderate TR.   Chronic pulmonary hypertension.   Patient was placed on furosemide for IV diuresis, negative fluid balance was achieved, - 6,166 ml, with significant improvement in her symptoms.   At the time of her discharge systolic blood pressure 95 to 102 mmHg.   Patient will continue diuresis with spironolactone and torsemide.    Not on SGLT 2 inh due to urinary retention and risk of urinary tract infection.  Holding B blocker, ace inh or ARB due to risk of hypotension.   Patient has declined SNF.   COPD (chronic obstructive pulmonary disease) (HCC) Chronic respiratory failure with hypoxia  No clinical signs of exacerbation, plan to continue with bronchodilator therapy and oxymetry monitoring Supplemental 02 per [place].    Chronic kidney disease, stage 3a ([place]) Renal function has remained stable, at the time of her discharge serum cr is 0,91 with K at 3,6 and serum bicarbonate at 36.  Plan to continue diuresis with torsemide and spironolactone and follow up renal function and electrolytes as outpatient.   Acute urinary retention. Patient had multiple in and out bladder catheterizations due to urinary retention.  Foley catheter was placed, plan to follow up urinary voiding trial as outpatient in 7 to 10 days.   Malnutrition ([place]) Continue nutritional supplementation. Patient has declined SNF she does have a high risk for rehospitalization.   Stage III squamous cell carcinoma of left lung [facility]) Following with oncology, Dr. Lorna Few.  Completed prior treatment with chemoradiation and immunotherapy.  Currently under observation.  Anemia of chronic disease, likely related to malignancy, Hgb have been stable  around 9. Check Iron panel as outpatient.   Cellulitis of left lower leg Patient has been treated with doxycycline with good toleration. Antibiotic therapy  transitioned to oral with good toleration and she completed therapy during her hospitalization.         Subjective: Patient with persistent urinary retention, required in and out catheterization,. This am is very tired and dedonditioned.   Physical Exam: Vitals:   08/17/21 0843 08/17/21 1015 08/17/21 2031 08/18/21 0613  BP:  (!) 93/50 110/65 110/65  Pulse:  98 84 88  Resp:  18 18 20   Temp:  97.9 F (36.6 C) 98 F (36.7 C) 98.7 F (37.1 C)  TempSrc:  Oral Oral Oral  SpO2: 96% 98% 93% 93%  Weight:    32 kg  Height:       Neurology awake and alert ENT with mild pallor Cardiovascular with S1 and S2 present and rhythmic Abdomen with no distention  No lower extremity edema  Data Reviewed:    Family Communication: no family at the bedside   Disposition: Status is: Inpatient Remains inpatient appropriate because: plan to transfer home in am.   Planned Discharge Destination: Home     Author: Tawni Millers, MD 08/18/2021 12:20 PM  For on call review [email or web address].

## 2021-08-18 NOTE — TOC Progression Note (Addendum)
Transition of Care [facility]) - Progression Note    Patient Details  Name: Sherry [name] MRN: [id number] Date of Birth: 10-21-1945  [facility]) CM/SW Contact  Zenon Mayo, RN Phone Number: 08/18/2021, 4:19 PM  Clinical Narrative:    Patient is having issues with urinary retention right now, she is set up with [facility] for [facility] services.  MD has filled out the PCS forms and CSW has faxed out to [place]. Patient states she needs some clothes  and shoes to go home with , plan is for dc tomorrow she states. She wears size 7 in pants, and shoes. She states her son will transport her home. NCM brought her some socks and a sweater, and ast Dir  ordered some xs scrubbs for her.   NCM checked with [employer] also and she has oxygen with them, they will bring a oxygen tank up for her to go home with.    Expected Discharge Plan: [place] Barriers to Discharge: No Barriers Identified  Expected Discharge Plan and Services Expected Discharge Plan: [place] In-house Referral: Clinical Social Work     Living arrangements for the past 2 months: Apartment                   DME Agency: NA       HH Arranged: RN, Disease Management, PT, OT, Nurse's Aide [place] Agency: [facility] ([facility]) Date HH Agency Contacted: 08/16/21 Time [place]: [place] Representative spoke with at [place]: [place] ([place]) Interventions    Readmission Risk Interventions     No data to display

## 2021-08-19 ENCOUNTER — Other Ambulatory Visit (HOSPITAL_COMMUNITY): Payer: Self-pay

## 2021-08-19 LAB — BASIC METABOLIC PANEL
Anion gap: 8 (ref 5–15)
BUN: 39 mg/dL — ABNORMAL HIGH (ref 8–23)
CO2: 36 mmol/L — ABNORMAL HIGH (ref 22–32)
Calcium: 8.6 mg/dL — ABNORMAL LOW (ref 8.9–10.3)
Chloride: 98 mmol/L (ref 98–111)
Creatinine, Ser: 0.91 mg/dL (ref 0.44–1.00)
GFR, Estimated: >60 mL/min (ref >60)
Glucose, Bld: 85 mg/dL (ref 70–99)
Potassium: 3.6 mmol/L (ref 3.5–5.1)
Sodium: 142 mmol/L (ref 135–145)

## 2021-08-19 LAB — MAGNESIUM: Magnesium: 1.6 mg/dL — ABNORMAL LOW (ref 1.7–2.4)

## 2021-08-19 MED ORDER — MAGNESIUM SULFATE 2 GM/50ML IV SOLN
2.0000 g | Freq: Once | INTRAVENOUS | Status: AC
  Administered 2021-08-19: 2 g via INTRAVENOUS
  Filled 2021-08-19: qty 50

## 2021-08-19 MED ORDER — SPIRONOLACTONE 25 MG PO TABS
12.5000 mg | ORAL_TABLET | Freq: Every day | ORAL | 0 refills | Status: AC
Start: 2021-08-20 — End: 2021-09-19
  Filled 2021-08-19: qty 15, 30d supply, fill #0

## 2021-08-19 MED ORDER — ENSURE ENLIVE PO LIQD
237.0000 mL | ORAL | 0 refills | Status: AC
  Filled 2021-08-19: qty 7110, 30d supply, fill #0

## 2021-08-19 MED ORDER — TORSEMIDE 20 MG PO TABS
20.0000 mg | ORAL_TABLET | Freq: Every day | ORAL | 0 refills | Status: AC
  Filled 2021-08-19: qty 30, 30d supply, fill #0

## 2021-08-19 MED ORDER — POTASSIUM CHLORIDE CRYS ER 20 MEQ PO TBCR
40.0000 meq | EXTENDED_RELEASE_TABLET | Freq: Once | ORAL | Status: AC
Start: 2021-08-19 — End: 2021-08-19
  Administered 2021-08-19: 40 meq via ORAL
  Filled 2021-08-19: qty 2

## 2021-08-19 NOTE — Discharge Instructions (Signed)
Recommendations at discharge:     Patient has been placed on torsemide and furosemide for diuresis Follow up renal function and electrolytes as outpatient Patient will need voiding trail as outpatient in 7 to 10 days Follow up with Eldridge Abrahams NP in 7 to 10 days. Follow up with Urology for voiding trial.

## 2021-08-19 NOTE — Progress Notes (Signed)
PT Cancellation Note  Patient Details Name: Sherry [name] MRN: [id number] DOB: October 11, 1945   Cancelled Treatment:    Reason Eval/Treat Not Completed: Patient declined, no reason specified. Pt reports she is going home today, declines all therapy at this time. Pt reports she will exercise again once she gets home.   Zenaida Niece 08/19/2021, 11:13 AM

## 2021-08-19 NOTE — Discharge Summary (Addendum)
Physician Discharge Summary   Patient: Sherry [name] MRN: [id number] DOB: Jan 06, 1946  Admit date:     08/11/2021  Discharge date: 08/19/21  Discharge Physician: Jimmy Picket Mohid Furuya   PCP: [name], NP   Recommendations at discharge:    Patient has been placed on torsemide and furosemide for diuresis Follow up renal function and electrolytes as outpatient Patient will need voiding trail as outpatient in 7 to 10 days Follow up with Sherry Abrahams NP in 7 to 10 days. Follow up with Urology for voiding trial.     I spoke over the phone with the patient's daughter about patient's  condition, plan of care, prognosis and all questions were addressed.   Discharge Diagnoses: Principal Problem:   Acute on chronic diastolic CHF (congestive heart failure) (HCC) Active Problems:   COPD (chronic obstructive pulmonary disease) (HCC)   Chronic kidney disease, stage 3a (HCC)   Malnutrition (HCC)   Stage III squamous cell carcinoma of left lung (HCC)   Cellulitis of right lower leg  Resolved Problems:   * No resolved hospital problems. [facility] Course: Sherry [name] was admitted to the hospital with the working diagnosis of heart failure decompensation.   76 y.o. female with medical history significant for COPD, chronic diastolic CHF (EF 02-58%), moderate-severe mitral regurgitation, stage III left-sided non-small cell lung cancer (s/p concurrent chemoradiation followed by immunotherapy, now under observation), chronic respiratory failure with hypoxia on 2 L O2 via [place], CKD stage IIIa, depression/anxiety, malnutrition, and former tobacco use who presented with progressive lower extremity edema. Reported 4 weeks of worsening lower extremity edema, and intermittent dyspnea. On her initial physical examination her blood pressure was 126/114, HR 101, RR 16, and 02 saturation 100% on 2 L/min. Lungs with decreased breath sounds bilaterally, heart with S1 and S2 present and rhythmic, abdomen  not distended, positive lower extremity edema. Left lower extremity erythematous rash on her right lower extremity.   Na 143, K 3,7 Cl 99, bicarbonate 37, glucose 96 bun 21 cr 1,0  High sensitive troponin 27 and 30  Wbc 7,4 hgb 11,4 plt 207  Urine analysis with SG 1,006, negative leukocytes.   Chest radiograph with hyperinflation, bilateral interstitial infiltrates, predominant at bases, with bilateral loculated pleural effusions.   EKG 95 bpm, normal axis, normal intervals, sinus rhythm, with no significant ST segment or T wave changes.   Patient was placed on IV furosemide for diuresis.  Transitioned to oral toresemide Patient had declined SNF and decided to go home with home health services.  She was noted to have urinary retention and foley catheter was placed. Patient will be discharged with foley catheter and instructions for voiding trial as outpatient.  Continue oral diuretic therapy.   Assessment and Plan: * Acute on chronic diastolic CHF (congestive heart failure) (HCC) Echocardiogram with preserved LV systolic function 55 to 52%, with mild reduction in RV systolic function. RVSP 54,2 mmHg. Moderate to severe mitral valve regurgitation. Mild to moderate TR.   Chronic pulmonary hypertension.   Patient was placed on furosemide for IV diuresis, negative fluid balance was achieved, - 6,166 ml, with significant improvement in her symptoms.   At the time of her discharge systolic blood pressure 95 to 102 mmHg.   Patient will continue diuresis with spironolactone and torsemide.    Not on SGLT 2 inh due to urinary retention and risk of urinary tract infection.  Holding B blocker, ace inh or ARB due to risk of hypotension.  Patient has declined SNF.   COPD (chronic obstructive pulmonary disease) (HCC) Chronic respiratory failure with hypoxia  No clinical signs of exacerbation, plan to continue with bronchodilator therapy and oxymetry monitoring Supplemental 02 per [place].     Chronic kidney disease, stage 3a ([place]) Renal function has remained stable, at the time of her discharge serum cr is 0,91 with K at 3,6 and serum bicarbonate at 36.  Plan to continue diuresis with torsemide and spironolactone and follow up renal function and electrolytes as outpatient.   Acute urinary retention. Patient had multiple in and out bladder catheterizations due to urinary retention.  Foley catheter was placed, plan to follow up urinary voiding trial as outpatient in 7 to 10 days.   Malnutrition ([place]) Continue nutritional supplementation. Patient has declined SNF she does have a high risk for rehospitalization.   Stage III squamous cell carcinoma of left lung [facility]) Following with oncology, Dr. Lorna Few.  Completed prior treatment with chemoradiation and immunotherapy.  Currently under observation.  Anemia of chronic disease, likely related to malignancy, Hgb have been stable around 9. Check Iron panel as outpatient.   Cellulitis of right lower leg Patient has been treated with doxycycline with good toleration. Antibiotic therapy transitioned to oral with good toleration and she completed therapy during her hospitalization.          Consultants: none  Procedures performed: none   Disposition: Home Diet recommendation:  Cardiac diet DISCHARGE MEDICATION: Allergies as of 08/19/2021       Reactions   Omnipaque [iohexol] Hives, Itching   Patient had a late reaction of itching and hives after scan will need premeds   Penicillins Hives   Has patient had a PCN reaction causing immediate rash, facial/tongue/throat swelling, SOB or lightheadedness with hypotension: no Has patient had a PCN reaction causing severe rash involving mucus membranes or skin necrosis: No Has patient had a PCN reaction that required hospitalization: No Has patient had a PCN reaction occurring within the last 10 years: No If all of the above answers are "NO", then may proceed with  Cephalosporin use.        Medication List     STOP taking these medications    furosemide 20 MG tablet Commonly known as: Lasix   nicotine 7 mg/24hr patch Commonly known as: NICODERM CQ - dosed in mg/24 hr   potassium chloride SA 20 MEQ tablet Commonly known as: KLOR-CON M       TAKE these medications    acetaminophen 650 MG CR tablet Commonly known as: Tylenol 8 Hour Take 1 tablet (650 mg total) by mouth every 8 (eight) hours as needed for pain or fever.   carboxymethylcellulose 0.5 % Soln Commonly known as: REFRESH PLUS Place 1 drop into both eyes daily as needed (dry eyes).   feeding supplement Liqd Take 237 mLs by mouth daily.   HYDROcodone-acetaminophen 5-325 MG tablet Commonly known as: NORCO/VICODIN Take 1 tablet by mouth every 6 (six) hours as needed for moderate pain.   meclizine 12.5 MG tablet Commonly known as: ANTIVERT Take 1 tablet (12.5 mg total) by mouth 2 (two) times daily as needed for dizziness (vertigo).   ProAir HFA 108 (90 Base) MCG/ACT inhaler Generic drug: albuterol Inhale 2 puffs into the lungs every 6 (six) hours as needed for wheezing or shortness of breath.   sodium chloride 0.65 % Soln nasal spray Commonly known as: OCEAN Place 1 spray into both nostrils daily as needed for congestion.   spironolactone 25 MG  tablet Commonly known as: ALDACTONE Take 0.5 tablets (12.5 mg total) by mouth daily. Start taking on: August 20, 2021   torsemide 20 MG tablet Commonly known as: DEMADEX Take 1 tablet (20 mg total) by mouth daily. Start taking on: August 20, 2021   Trelegy Ellipta 100-62.5-25 MCG/ACT Aepb Generic drug: Fluticasone-Umeclidin-Vilant Inhale 1 Inhalation into the lungs daily.        Follow-up Information     [facility] Follow up.   Why: Agency will contact you to coordinate apt times  [place]               Discharge Exam: [place] Weights   08/17/21 0454 08/18/21 0613 08/19/21 0136  Weight:  32.2 kg 32 kg 31.8 kg   BP 95/60   Pulse 82   Temp 98.6 F (37 C) (Oral)   Resp (!) 21   Ht 5\' 1"  (1.549 m)   Wt 31.8 kg   SpO2 97%   BMI 13.25 kg/m   Patient is feeling well, no chest pain or dyspnea.,   Neurology awake and alert ENT with mild pallor Cardiovascular with S1 and S2 present and rhythmic with no gallops  Respiratory with no rales or wheezing Abdomen not distended No lower extremity edema   Condition at discharge: good  The results of significant diagnostics from this hospitalization (including imaging, microbiology, ancillary and laboratory) are listed below for reference.   Imaging Studies: CT ABDOMEN PELVIS W CONTRAST  Result Date: 08/16/2021 CLINICAL DATA:  Acute abdominal pain. EXAM: CT ABDOMEN AND PELVIS WITH CONTRAST TECHNIQUE: Multidetector CT imaging of the abdomen and pelvis was performed using the standard protocol following bolus administration of intravenous contrast. RADIATION DOSE REDUCTION: This exam was performed according to the departmental dose-optimization program which includes automated exposure control, adjustment of the mA and/or kV according to patient size and/or use of iterative reconstruction technique. CONTRAST:  58mL OMNIPAQUE IOHEXOL 300 MG/ML  SOLN Patient was pre-medicated for for contrast allergy. COMPARISON:  PET CT 08/03/2017 FINDINGS: Lower chest: Moderate bilateral pleural effusions. Left pleural effusion is partially loculated. Possible complete atelectasis of the left lower lobe. Emphysema at the lung bases. Hepatobiliary: No focal liver abnormality is seen. No gallstones, gallbladder wall thickening, or biliary dilatation. Pancreas: No ductal dilatation or inflammation. Spleen: Normal in size without focal abnormality. Adrenals/Urinary Tract: No adrenal nodule. There is mild symmetric bilateral hydronephrosis. No evidence of urolithiasis. Prominently distended urinary bladder. Bladder volume = 859 cm^3. No bladder wall thickening.  Stomach/Bowel: Bowel assessment is limited in the absence of enteric contrast and paucity of intra-abdominal fat. Small to moderate hiatal hernia. Decompressed stomach. Occasional fluid-filled small bowel without obstruction or inflammation. Moderate volume of colonic stool. No obvious colonic inflammation. Vascular/Lymphatic: Aortic atherosclerosis. No aneurysm. Patent portal vein. No bulky adenopathy. Reproductive: Atrophic uterus.  No obvious adnexal mass. Other: Small volume abdominopelvic ascites. Paucity of intra-abdominal and subcutaneous fat suggest cachexia. There is diffuse edema of the subcutaneous and intra-abdominal fat. Musculoskeletal: Bony under mineralization. Mild L4 compression deformity with less than 50% loss of height centrally. Lower thoracic compression deformities with vertebral augmentation. IMPRESSION: 1. Prominently distended urinary bladder with mild symmetric bilateral hydronephrosis, suspicious for urinary retention. 2. Moderate bilateral pleural effusions. Left pleural effusion is partially loculated. Suspected complete atelectasis of the left lower lobe. 3. Small volume abdominopelvic ascites and diffuse edema of the subcutaneous and intra-abdominal fat. 4. Small to moderate-sized hiatal hernia Aortic Atherosclerosis (ICD10-I70.0) and Emphysema (ICD10-J43.9). Electronically Signed   By: Threasa Beards  [place] M.D.   On: 08/16/2021 00:59   DG Chest 1 View  Result Date: 08/11/2021 CLINICAL DATA:  Shortness of breath EXAM: CHEST  1 VIEW COMPARISON:  07/05/2021 FINDINGS: Transverse diameter of Elnoria Howard is increased. Central pulmonary vessels are more prominent. There is interval increase in interstitial and alveolar markings in both lungs, more so in parahilar regions and lower lung fields. Moderate bilateral pleural effusions are seen with interval increase. There is no pneumothorax. There is an electronic device partly obscuring the left parahilar region. There is previous vertebroplasty in  multiple thoracic vertebrae. IMPRESSION: There is interval worsening of pulmonary vascular congestion and pulmonary edema. Moderate bilateral pleural effusions with interval increase. Evaluation of lower lung fields for infiltrates is limited by the effusions. Possibility of underlying atelectasis/pneumonia in the lower lung fields is not excluded. Electronically Signed   By: Elmer Picker M.D.   On: 08/11/2021 17:23   DG Tibia/Fibula Left  Result Date: 08/11/2021 CLINICAL DATA:  Leg pain and swelling EXAM: LEFT TIBIA AND FIBULA - 2 VIEW COMPARISON:  None Available. FINDINGS: There is no evidence of fracture or other focal bone lesions. Demineralization. Soft tissues are unremarkable. IMPRESSION: Negative. Electronically Signed   By: Placido Sou M.D.   On: 08/11/2021 17:23    Microbiology: Results for orders placed or performed during the hospital encounter of 06/20/21  Respiratory (~20 pathogens) panel by PCR     Status: None   Collection Time: 06/21/21  1:14 PM   Specimen: Nasopharyngeal Swab; Respiratory  Result Value Ref Range Status   Adenovirus NOT DETECTED NOT DETECTED Final   Coronavirus 229E NOT DETECTED NOT DETECTED Final    Comment: (NOTE) The Coronavirus on the Respiratory Panel, DOES NOT test for the novel  Coronavirus (2019 nCoV)    Coronavirus HKU1 NOT DETECTED NOT DETECTED Final   Coronavirus NL63 NOT DETECTED NOT DETECTED Final   Coronavirus OC43 NOT DETECTED NOT DETECTED Final   Metapneumovirus NOT DETECTED NOT DETECTED Final   Rhinovirus / Enterovirus NOT DETECTED NOT DETECTED Final   Influenza A NOT DETECTED NOT DETECTED Final   Influenza B NOT DETECTED NOT DETECTED Final   Parainfluenza Virus 1 NOT DETECTED NOT DETECTED Final   Parainfluenza Virus 2 NOT DETECTED NOT DETECTED Final   Parainfluenza Virus 3 NOT DETECTED NOT DETECTED Final   Parainfluenza Virus 4 NOT DETECTED NOT DETECTED Final   Respiratory Syncytial Virus NOT DETECTED NOT DETECTED Final    Bordetella pertussis NOT DETECTED NOT DETECTED Final   Bordetella Parapertussis NOT DETECTED NOT DETECTED Final   Chlamydophila pneumoniae NOT DETECTED NOT DETECTED Final   Mycoplasma pneumoniae NOT DETECTED NOT DETECTED Final    Comment: Performed at [facility], [address], [place], [place] [address]  MRSA Next Gen by PCR, Nasal     Status: None   Collection Time: 06/21/21  3:30 PM   Specimen: Nasal Mucosa; Nasal Swab  Result Value Ref Range Status   MRSA by PCR Next Gen NOT DETECTED NOT DETECTED Final    Comment: (NOTE) The GeneXpert MRSA Assay (FDA approved for NASAL specimens only), is one component of a comprehensive MRSA colonization surveillance program. It is not intended to diagnose MRSA infection nor to guide or monitor treatment for MRSA infections. Test performance is not FDA approved in patients less than 66 years old. Performed at [facility], [place] [address] [place], [place] [address]     Labs: CBC: Recent Labs  Lab 08/13/21 [phone] 08/15/21 [phone] 08/16/21  0725 08/17/21 0348  WBC 7.6 6.6 5.6 5.3  HGB 10.2* 10.6* 10.4* 9.2*  HCT 33.5* 34.1* 34.2* 30.3*  MCV 92.8 90.5 91.0 91.8  PLT 175 204 230 568   Basic Metabolic Panel: Recent Labs  Lab 08/15/21 0420 08/16/21 0725 08/17/21 0348 08/18/21 0430 08/19/21 0354  NA 143 143 143 142 142  K 3.1* 4.4 3.5 4.1 3.6  CL 93* 99 100 97* 98  CO2 39* 36* 35* 39* 36*  GLUCOSE 89 118* 81 85 85  BUN 18 26* 30* 37* 39*  CREATININE 0.92 0.94 0.82 1.10* 0.91  CALCIUM 8.9 9.4 9.1 9.1 8.6*  MG  --   --   --   --  1.6*   Liver Function Tests: Recent Labs  Lab 08/15/21 0420  AST 14*  ALT 10  ALKPHOS 46  BILITOT 1.2  PROT 5.7*  ALBUMIN 3.6   CBG: No results for input(s): "GLUCAP" in the last 168 hours.  Discharge time spent: greater than 30 minutes.  Signed: Tawni Millers, MD [facility] 08/19/2021

## 2021-08-19 NOTE — Progress Notes (Signed)
Mobility Specialist Progress Note:   08/19/21 1215  Mobility  Activity Ambulated with assistance in room;Ambulated with assistance to bathroom  Level of Assistance Independent  Assistive Device None  Distance Ambulated (ft) 40 ft  Activity Response Tolerated well  $Mobility charge 1 Mobility   Pt received EOB asking to go to bathroom. No complaints of pain. Left in bed with call bell in reach and all needs met.   [facility] Bemnet Trovato Mobility Specialist

## 2021-08-19 NOTE — TOC Transition Note (Addendum)
Transition of Care [facility]) - CM/SW Discharge Note   Patient Details  Name: Sherry [name] MRN: [id number] Date of Birth: [birth year]  [facility]) CM/SW Contact:  Zenon Mayo, RN Phone Number: 08/19/2021, 9:51 AM   Clinical Narrative:    Patient is for dc today, NCM notified Caryl Pina with [facility].  Patient will have transportation. [employer] will bring oxygen tank up for her to go home with.  NCM  supplied socks and shirt for patient, but will need some bottom scrubs.  Patient would like [facility] for outpatient palliative.  NCM making referral to [facility].  Patient states her son will be here this afternoon to transport her home, he is at work now.    Final next level of care: [place] Barriers to Discharge: No Barriers Identified   Patient Goals and CMS Choice Patient states their goals for this hospitalization and ongoing recovery are:: return home with [facility] CMS Medicare.gov Compare Post Acute Care list provided to:: Patient Choice offered to / list presented to : Patient  Discharge Placement                       Discharge Plan and Services In-house Referral: Clinical Social Work                DME Agency: NA       HH Arranged: RN, Disease Management, PT, OT, Nurse's Aide [place] Agency: [place] ([facility]) Date HH Agency Contacted: 08/16/21 Time [place]: [place] Representative spoke with at [place]: [place] ([place]) Interventions     Readmission Risk Interventions     No data to display

## 2021-08-19 NOTE — Care Management Important Message (Signed)
Important Message  Patient Details  Name: KRYSTEENA STALKER MRN: [id number] Date of Birth: 02-May-1945   Medicare Important Message Given:  Yes     Shelda Altes 08/19/2021, 9:44 AM

## 2021-08-19 NOTE — Plan of Care (Signed)
  Problem: Pain Managment: Goal: General experience of comfort will improve Outcome: Completed/Met   Problem: Safety: Goal: Ability to remain free from injury will improve Outcome: Completed/Met

## 2021-09-06 ENCOUNTER — Encounter: Payer: Self-pay | Admitting: Physician Assistant

## 2021-09-06 ENCOUNTER — Ambulatory Visit: Payer: Medicare Other | Attending: Physician Assistant | Admitting: Physician Assistant

## 2021-09-06 VITALS — BP 92/68 | HR 90 | Ht 60.0 in | Wt <= 1120 oz

## 2021-09-06 DIAGNOSIS — E43 Unspecified severe protein-calorie malnutrition: Secondary | ICD-10-CM

## 2021-09-06 DIAGNOSIS — I340 Nonrheumatic mitral (valve) insufficiency: Secondary | ICD-10-CM

## 2021-09-06 DIAGNOSIS — I5033 Acute on chronic diastolic (congestive) heart failure: Secondary | ICD-10-CM | POA: Diagnosis not present

## 2021-09-06 NOTE — Progress Notes (Unsigned)
Cardiology Office Note:    Date:  09/08/2021   ID:  Sherry [name], DOB Mar 01, 1945, MRN [id number]  PCP:  [name], NP   [place] [facility] Cardiologist:  Quay Burow, MD     Referring MD: [name], NP   Chief Complaint  Patient presents with   Follow-up    6 weeks.    History of Present Illness:    Sherry [name] is a 76 y.o. female with a hx of COPD, non-small cell lung cancer diagnosed in 2019 in remission, severe protein calorie malnutrition, depression, moderate to severe MR and history of syncope.  Patient has been followed by pulmonology service and has been treated with chemoradiation therapy.  She presented to the emergency room on 06/20/2021 with complaint of syncope.  She was walking in the freezer section at [facility] and subsequently developed nausea and noted multiple colors in her vision.  She walked to the bathroom however did not remember any events after that.  EMS reported she had loss of consciousness that for roughly 10 minutes.  She was tachypneic and hypoxic requiring O2.  Serial troponin was flat.  Creatinine 1.23.  Sodium and potassium normal.  Hemoglobin 12.2.  EKG showed normal sinus rhythm, heart rate 96 bpm.  Chest x-ray showed small bilateral pleural effusion slightly loculated and emphysema.  She was admitted by internal medicine service.  Echocardiogram obtained on 06/21/2021 showed EF 55 to 60%, no regional wall motion abnormality, grade 2 DD, mildly reduced RV, moderately elevated PA pressure, moderate to severe MR, mild to moderate TR.  Cardiology service was consulted to further evaluate.  Her symptom was felt to be vagal in nature.  CTA of chest was negative for PE.  A 2-week heart monitor was recommended for outpatient evaluation.  During hospitalization, patient was given a single dose of IV Lasix with potassium supplement.  She was also treated with Solu-Medrol.  Home oxygen was arranged.  Since discharge, patient has returned on  07/05/2021 and reported fluttering heart rate for 2 hours in the morning which resolved by the time she went to the ED.  She also had chest pain on and off for 2 days.  Troponin was flat however elevated to 101-->107.  Chest x-ray showed stable moderate size loculated pleural effusion with atelectasis, mildly reduced aeration in the left lung base.  Potassium was low at 2.8, creatinine 1.02.  Patient was given potassium supplement. On arrival, O2 saturation was 73%.  After placed on 2 lpm oxygen, O2 saturation improved to 98%.  She was seen by pulmonology service who was concerned about volume overload and started her on Lasix and potassium supplement for 2 days then changed to as needed after that.  I last saw the patient on 07/20/2020, O2 saturation was normal, she did have 2+ ankle edema that I felt related to low albumin and the dilated veins.  I recommended proceed with 2-week heart monitor.  I also recommended leg elevation for lower extremity edema.  Unfortunately she was admitted to the hospital for 8 days started on 08/11/2021.  She was found to be volume overloaded due to worsening lower extremity edema.  She underwent IV diuresis.  Chest x-ray showed bilateral loculated pleural effusion.  She was eventually transitioned to oral torsemide.  She declined skilled nursing facility and decided to go home.  Surprisingly, she put out about 6 L during the hospitalization.  She was not on SGLT2 inhibitor due to her urine urinary retention  and the risk of urinary tract infection.  She was also treated with doxycycline for right lower extremity cellulitis.  Discharge weight was 70 pounds.  At discharge, patient has been seen by the urology service who started her on low-dose Flomax every night.  After she got home, she continued to lose weight despite having good appetite and eating very frequently.  She had a blood work on 08/26/2021 showed stable renal function.  Today, her blood weight is only 61 pounds.  Blood  pressure borderline low.  I am concerned that she might be dehydrated.  I will obtain a basic metabolic panel, I have low threshold of reducing her torsemide down to 10 mg daily.  She says she is breathing good.  She denies any significant chest pain or worsening dyspnea.  We reviewed her previous echocardiogram, given her frailty and cachexia, I really do not think she will be a good candidate for any valve repair.  Otherwise, I will see her back in 6 weeks for close outpatient follow-up.  Note, call daughter regarding lab results tomorrow  Past Medical History:  Diagnosis Date   Allergy    Anxiety    Arthritis    Cancer ([place])    Cavitating mass in left lower lung lobe    Complication of anesthesia    woke up during procedure   COPD (chronic obstructive pulmonary disease) ([place])    not on home O2   Depression    Family history of adverse reaction to anesthesia    " my mother was always hard to wake up"   Full dentures    Headache    Neuromuscular disorder ([place])    Pneumonia    Thoracic spine fracture ([place]) 11/01/2017   T9   Wears glasses     Past Surgical History:  Procedure Laterality Date   ELBOW SURGERY     for  "Tennis Elbow"   hand surgery     IR KYPHO THORACIC WITH BONE BIOPSY  11/27/2017   IR KYPHO THORACIC WITH BONE BIOPSY  03/01/2018   IR KYPHO THORACIC WITH BONE BIOPSY  03/26/2018   IR RADIOLOGIST EVAL & MGMT  11/20/2017   IR RADIOLOGIST EVAL & MGMT  02/26/2018   IR RADIOLOGIST EVAL & MGMT  03/13/2018   MULTIPLE TOOTH EXTRACTIONS     TUBAL LIGATION     VIDEO BRONCHOSCOPY N/A 08/31/2017   Procedure: VIDEO BRONCHOSCOPY;  Surgeon: Melrose Nakayama, MD;  Location: [facility] OR;  Service: Thoracic;  Laterality: N/A;    Current Medications: Current Meds  Medication Sig   acetaminophen (TYLENOL 8 HOUR) 650 MG CR tablet Take 1 tablet (650 mg total) by mouth every 8 (eight) hours as needed for pain or fever.   albuterol (VENTOLIN HFA) 108 (90 Base) MCG/ACT inhaler Inhale 2  puffs into the lungs every 6 (six) hours as needed for wheezing or shortness of breath.   carboxymethylcellulose (REFRESH PLUS) 0.5 % SOLN Place 1 drop into both eyes daily as needed (dry eyes).   Ergocalciferol (VITAMIN D2 PO) Take 1 Capful by mouth daily.   feeding supplement (ENSURE ENLIVE / ENSURE PLUS) LIQD Take 237 mLs by mouth daily.   Fluticasone-Umeclidin-Vilant (TRELEGY ELLIPTA) 100-62.5-25 MCG/ACT AEPB Inhale 1 Inhalation into the lungs daily.   HYDROcodone-acetaminophen (NORCO/VICODIN) 5-325 MG tablet Take 1 tablet by mouth every 6 (six) hours as needed for moderate pain.   meclizine (ANTIVERT) 12.5 MG tablet Take 1 tablet (12.5 mg total) by mouth 2 (two) times daily as needed  for dizziness (vertigo).   SERTRALINE HCL PO Take 1 tablet by mouth daily.   sodium chloride (OCEAN) 0.65 % SOLN nasal spray Place 1 spray into both nostrils daily as needed for congestion.   spironolactone (ALDACTONE) 25 MG tablet Take 1/2 tablet (12.5 mg total) by mouth daily.   tamsulosin (FLOMAX) 0.4 MG CAPS capsule Take 0.4 mg at bedtime   torsemide (DEMADEX) 20 MG tablet Take 1 tablet (20 mg total) by mouth daily.     Allergies:   Omnipaque [iohexol] and Penicillins   Social History   Socioeconomic History   Marital status: Divorced    Spouse name: Not on file   Number of children: Not on file   Years of education: Not on file   Highest education level: Not on file  Occupational History   Occupation: retired  Tobacco Use   Smoking status: Every Day    Packs/day: 1.00    Years: 50.00    Total pack years: 50.00    Types: Cigarettes   Smokeless tobacco: Never   Tobacco comments:    No smoking since June 12th 2023 07/19/2021 hfb  Vaping Use   Vaping Use: Never used  Substance and Sexual Activity   Alcohol use: No   Drug use: No   Sexual activity: Never  Other Topics Concern   Not on file  Social History Narrative   Not on file   Social Determinants of Health   Financial Resource  Strain: Not on file  Food Insecurity: Not on file  Transportation Needs: Not on file  Physical Activity: Not on file  Stress: Not on file  Social Connections: Not on file     Family History: The patient's family history includes Diabetes in her mother.  ROS:   Please see the history of present illness.     All other systems reviewed and are negative.  EKGs/Labs/Other Studies Reviewed:    The following studies were reviewed today:  Echo 06/21/2021  1. Left ventricular ejection fraction, by estimation, is 55 to 60%. The  left ventricle has normal function. The left ventricle has no regional  wall motion abnormalities. Left ventricular diastolic parameters are  consistent with Grade II diastolic  dysfunction (pseudonormalization). Elevated left atrial pressure.   2. Right ventricular systolic function is mildly reduced. The right  ventricular size is mildly enlarged. There is moderately elevated  pulmonary artery systolic pressure. The estimated right ventricular  systolic pressure is 53.9 mmHg.   3. The mitral valve is abnormal. Moderate to severe mitral valve  regurgitation. No evidence of mitral stenosis. Posterior directed MR jet.  Normal LA/LV size, low E wave velocity suggests likely moderate MR   4. Tricuspid valve regurgitation is mild to moderate.   5. The aortic valve is tricuspid. Aortic valve regurgitation is trivial.  No aortic stenosis is present.   6. The inferior vena cava is dilated in size with <50% respiratory  variability, suggesting right atrial pressure of 15 mmHg.   EKG:  EKG is not ordered today.    Recent Labs: 08/11/2021: B Natriuretic Peptide 1,572.8 08/15/2021: ALT 10 08/17/2021: Hemoglobin 9.2; Platelets 215 08/19/2021: Magnesium 1.6 09/06/2021: BUN 45; Creatinine, Ser 1.11; Potassium 5.3; Sodium 140  Recent Lipid Panel No results found for: "CHOL", "TRIG", "HDL", "CHOLHDL", "VLDL", "LDLCALC", "LDLDIRECT"   Risk Assessment/Calculations:            Physical Exam:    VS:  BP 92/68 (BP Location: Left Arm, Patient Position: Sitting, Cuff Size: Normal)  Pulse 90   Ht 5' (1.524 m)   Wt 61 lb (27.7 kg)   SpO2 100%   BMI 11.91 kg/m        Wt Readings from Last 3 Encounters:  09/06/21 61 lb (27.7 kg)  08/19/21 70 lb 1.7 oz (31.8 kg)  07/20/21 72 lb 12.8 oz (33 kg)     GEN:  Well nourished, well developed in no acute distress HEENT: Normal NECK: No JVD; No carotid bruits LYMPHATICS: No lymphadenopathy CARDIAC: RRR, no murmurs, rubs, gallops RESPIRATORY:  Clear to auscultation without rales, wheezing or rhonchi  ABDOMEN: Soft, non-tender, non-distended MUSCULOSKELETAL:  No edema; No deformity  SKIN: Warm and dry NEUROLOGIC:  Alert and oriented x 3 PSYCHIATRIC:  Normal affect   ASSESSMENT:    1. Acute on chronic diastolic CHF (congestive heart failure) ([place])   2. Protein-calorie malnutrition, severe   3. Moderate to severe mitral regurgitation    PLAN:    In order of problems listed above:  Acute on chronic diastolic heart failure: Recently underwent IV diuresis.  Since discharge, she continued to lose weight.  I am concerned of dehydration.  She is on low-dose spironolactone and torsemide.  I will obtain basic metabolic panel today.  I may decide to cut back on diuretic if there is sign of dehydration  Severe protein calorie malnutrition: She says she has good appetite and has been eating quite frequently  Moderate to severe MR: I do not think she is a good candidate for any valve surgery given severe frailty           Medication Adjustments/Labs and Tests Ordered: Current medicines are reviewed at length with the patient today.  Concerns regarding medicines are outlined above.  Orders Placed This Encounter  Procedures   Basic metabolic panel   No orders of the defined types were placed in this encounter.   Patient Instructions  Medication Instructions:  Continue same medications *If you need a refill  on your cardiac medications before your next appointment, please call your pharmacy*   Lab Work: Bmet today   Testing/Procedures: None ordered   Follow-Up: At [facility] you and your health needs are our priority.  As part of our continuing mission to provide you with exceptional heart care, we have created designated Provider Care Teams.  These Care Teams include your primary Cardiologist (physician) and Advanced Practice Providers (APPs -  Physician Assistants and Nurse Practitioners) who all work together to provide you with the care you need, when you need it.  We recommend signing up for the patient portal called "MyChart".  Sign up information is provided on this After Visit Summary.  MyChart is used to connect with patients for Virtual Visits (Telemedicine).  Patients are able to view lab/test results, encounter notes, upcoming appointments, etc.  Non-urgent messages can be sent to your provider as well.   To learn more about what you can do with MyChart, go to [email or web address].    Your next appointment:  6 weeks    The format for your next appointment: Office   Provider:  Almyra Deforest PA   Important Information About Sugar         Hilbert Corrigan, [place]  09/08/2021 11:18 PM    [place]

## 2021-09-06 NOTE — Patient Instructions (Signed)
Medication Instructions:  Continue same medications *If you need a refill on your cardiac medications before your next appointment, please call your pharmacy*   Lab Work: Bmet today   Testing/Procedures: None ordered   Follow-Up: At [facility], you and your health needs are our priority.  As part of our continuing mission to provide you with exceptional heart care, we have created designated Provider Care Teams.  These Care Teams include your primary Cardiologist (physician) and Advanced Practice Providers (APPs -  Physician Assistants and Nurse Practitioners) who all work together to provide you with the care you need, when you need it.  We recommend signing up for the patient portal called "MyChart".  Sign up information is provided on this After Visit Summary.  MyChart is used to connect with patients for Virtual Visits (Telemedicine).  Patients are able to view lab/test results, encounter notes, upcoming appointments, etc.  Non-urgent messages can be sent to your provider as well.   To learn more about what you can do with MyChart, go to [email or web address].    Your next appointment:  6 weeks    The format for your next appointment: Office   Provider:  Almyra Deforest PA   Important Information About Sugar

## 2021-09-07 ENCOUNTER — Encounter: Payer: Self-pay | Admitting: Internal Medicine

## 2021-09-07 LAB — BASIC METABOLIC PANEL
BUN/Creatinine Ratio: 41 — ABNORMAL HIGH (ref 12–28)
BUN: 45 mg/dL — ABNORMAL HIGH (ref 8–27)
CO2: 29 mmol/L (ref 20–29)
Calcium: 10.2 mg/dL (ref 8.7–10.3)
Chloride: 96 mmol/L (ref 96–106)
Creatinine, Ser: 1.11 mg/dL — ABNORMAL HIGH (ref 0.57–1.00)
Glucose: 88 mg/dL (ref 70–99)
Potassium: 5.3 mmol/L — ABNORMAL HIGH (ref 3.5–5.2)
Sodium: 140 mmol/L (ref 134–144)
eGFR: 52 mL/min/{1.73_m2} — ABNORMAL LOW (ref >59)

## 2021-09-08 ENCOUNTER — Other Ambulatory Visit: Payer: Self-pay

## 2021-09-08 DIAGNOSIS — I5033 Acute on chronic diastolic (congestive) heart failure: Secondary | ICD-10-CM

## 2021-09-13 ENCOUNTER — Other Ambulatory Visit (HOSPITAL_COMMUNITY): Payer: Self-pay

## 2021-09-14 ENCOUNTER — Other Ambulatory Visit: Payer: Self-pay

## 2021-09-14 DIAGNOSIS — I5033 Acute on chronic diastolic (congestive) heart failure: Secondary | ICD-10-CM

## 2021-09-15 ENCOUNTER — Encounter: Payer: Self-pay | Admitting: Internal Medicine

## 2021-09-15 LAB — BASIC METABOLIC PANEL
BUN/Creatinine Ratio: 31 — ABNORMAL HIGH (ref 12–28)
BUN: 32 mg/dL — ABNORMAL HIGH (ref 8–27)
CO2: 28 mmol/L (ref 20–29)
Calcium: 9.9 mg/dL (ref 8.7–10.3)
Chloride: 97 mmol/L (ref 96–106)
Creatinine, Ser: 1.03 mg/dL — ABNORMAL HIGH (ref 0.57–1.00)
Glucose: 88 mg/dL (ref 70–99)
Potassium: 4.9 mmol/L (ref 3.5–5.2)
Sodium: 141 mmol/L (ref 134–144)
eGFR: 56 mL/min/{1.73_m2} — ABNORMAL LOW (ref >59)

## 2021-09-30 ENCOUNTER — Telehealth: Payer: Self-pay

## 2021-09-30 NOTE — Telephone Encounter (Addendum)
Left voice message for patient to return call for monitor results. Will try calling again   ----- Message from Almyra Deforest, [place] sent at 09/30/2021  2:45 PM EDT ----- Normal rhythm with occasional transient bursts, none of which last very long. There were 90 episodes of triggered events, I reviewed the strips, almost all triggered events were associated with normal rhythm, although there were a few where the patient's heart rate was mildly elevated, however due to artifact, I can not make out the underlying rhythm. I did not see anything on this heart monitor that would make her dizziness or having near passing out spells. Overall, reassuring study.

## 2021-09-30 NOTE — Telephone Encounter (Signed)
Returned call to patient. The patient has been notified of the result and verbalized understanding.  All questions (if any) were answered. Sherry [name], [place] 09/30/2021 5:07 PM

## 2021-09-30 NOTE — Telephone Encounter (Signed)
Left a message for the patient to call back.  

## 2021-09-30 NOTE — Telephone Encounter (Signed)
Patient returned CMA's call. 

## 2021-10-14 ENCOUNTER — Telehealth: Payer: Self-pay | Admitting: Physician Assistant

## 2021-10-14 ENCOUNTER — Ambulatory Visit: Payer: Medicare Other | Attending: Cardiovascular Disease | Admitting: Physician Assistant

## 2021-10-14 NOTE — Progress Notes (Signed)
This encounter was created in error - please disregard.

## 2021-10-14 NOTE — Telephone Encounter (Signed)
Patient was not seen today as she had another appointment at 4 PM and I was unable to see her again reasonable manner.  I called Sherry [name] afterward to apologize and follow-up on her progress since the last visit.  She says she is doing really well.  Her weight did gain roughly 5 pounds, however this is not due to volume overload, but instead she is eating more and had a true weight gain.  This is great news says that she was quite cachectic when I saw her last time and the only way to 61 pounds.  She says she is eating well and does not have significant worsening dyspnea.  She was seen by her PCP today who also felt she was stable from the cardiac perspective.   We will contact her to arrange 2 to 35-month follow-up.

## 2022-01-18 ENCOUNTER — Other Ambulatory Visit: Payer: Medicare Other

## 2022-01-19 ENCOUNTER — Other Ambulatory Visit: Payer: Medicare Other

## 2022-01-23 ENCOUNTER — Ambulatory Visit: Payer: Medicare Other | Admitting: Internal Medicine

## 2022-02-06 ENCOUNTER — Telehealth: Payer: Self-pay | Admitting: Medical Oncology

## 2022-02-06 NOTE — Telephone Encounter (Signed)
I lvm to call radiology to schedule scan.

## 2022-02-13 ENCOUNTER — Telehealth: Payer: Self-pay | Admitting: Physician Assistant

## 2022-02-13 NOTE — Telephone Encounter (Signed)
Per 1/31 IB, left msg with pt

## 2022-02-20 ENCOUNTER — Telehealth: Payer: Self-pay

## 2022-02-20 ENCOUNTER — Telehealth: Payer: Self-pay | Admitting: Physician Assistant

## 2022-02-20 ENCOUNTER — Inpatient Hospital Stay: Payer: 59

## 2022-02-20 NOTE — Telephone Encounter (Signed)
This nurse received a message from this patient stating that she is scheduled for labs today at 2 pm an dshe is not going to be able to make it.  She states that she ate something last night that has her stomach upset today.  She states that she will call scheduling to reschedule her appointment when she gets a chance.  No further questions or concerns noted at this time.

## 2022-02-20 NOTE — Telephone Encounter (Signed)
Per 2/12 IB, called pt to set up lab before appt on 2/15, pt said they will not set up anything right now but will call us back to r/s

## 2022-02-20 NOTE — Progress Notes (Deleted)
[place] OFFICE PROGRESS NOTE  [name], NP [place] [address]  DIAGNOSIS: ***  PRIOR THERAPY:  CURRENT THERAPY:  INTERVAL HISTORY: Sherry [name] 77 y.o. female returns for *** regular *** visit for followup of ***   MEDICAL HISTORY: Past Medical History:  Diagnosis Date   Allergy    Anxiety    Arthritis    Cancer ([place])    Cavitating mass in left lower lung lobe    Complication of anesthesia    woke up during procedure   COPD (chronic obstructive pulmonary disease) ([place])    not on home O2   Depression    Family history of adverse reaction to anesthesia    " my mother was always hard to wake up"   Full dentures    Headache    Neuromuscular disorder ([place])    Pneumonia    Thoracic spine fracture ([place]) 11/01/2017   T9   Wears glasses     ALLERGIES:  is allergic to omnipaque [iohexol] and penicillins.  MEDICATIONS:  Current Outpatient Medications  Medication Sig Dispense Refill   acetaminophen (TYLENOL 8 HOUR) 650 MG CR tablet Take 1 tablet (650 mg total) by mouth every 8 (eight) hours as needed for pain or fever. 30 tablet 0   albuterol (VENTOLIN HFA) 108 (90 Base) MCG/ACT inhaler Inhale 2 puffs into the lungs every 6 (six) hours as needed for wheezing or shortness of breath. 8.5 g 1   carboxymethylcellulose (REFRESH PLUS) 0.5 % SOLN Place 1 drop into both eyes daily as needed (dry eyes).     Ergocalciferol (VITAMIN D2 PO) Take 1 Capful by mouth daily.     Fluticasone-Umeclidin-Vilant (TRELEGY ELLIPTA) 100-62.5-25 MCG/ACT AEPB Inhale 1 Inhalation into the lungs daily. 60 each 5   HYDROcodone-acetaminophen (NORCO/VICODIN) 5-325 MG tablet Take 1 tablet by mouth every 6 (six) hours as needed for moderate pain.     meclizine (ANTIVERT) 12.5 MG tablet Take 1 tablet (12.5 mg total) by mouth 2 (two) times daily as needed for dizziness (vertigo). 30 tablet 0   SERTRALINE HCL PO Take 1 tablet by mouth daily.     sodium  chloride (OCEAN) 0.65 % SOLN nasal spray Place 1 spray into both nostrils daily as needed for congestion.     spironolactone (ALDACTONE) 25 MG tablet Take 1/2 tablet (12.5 mg total) by mouth daily. 15 tablet 0   tamsulosin (FLOMAX) 0.4 MG CAPS capsule Take 0.4 mg at bedtime 30 capsule    torsemide (DEMADEX) 20 MG tablet Take 1 tablet (20 mg total) by mouth daily. 30 tablet 0   No current facility-administered medications for this visit.    SURGICAL HISTORY:  Past Surgical History:  Procedure Laterality Date   ELBOW SURGERY     for  "Tennis Elbow"   hand surgery     IR KYPHO THORACIC WITH BONE BIOPSY  11/27/2017   IR KYPHO THORACIC WITH BONE BIOPSY  03/01/2018   IR KYPHO THORACIC WITH BONE BIOPSY  03/26/2018   IR RADIOLOGIST EVAL & MGMT  11/20/2017   IR RADIOLOGIST EVAL & MGMT  02/26/2018   IR RADIOLOGIST EVAL & MGMT  03/13/2018   MULTIPLE TOOTH EXTRACTIONS     TUBAL LIGATION     VIDEO BRONCHOSCOPY N/A 08/31/2017   Procedure: VIDEO BRONCHOSCOPY;  Surgeon: [place] Nakayama, MD;  Location: [facility];  Service: Thoracic;  Laterality: N/A;    REVIEW OF SYSTEMS:   Review of Systems  Constitutional: Negative for appetite change, chills, fatigue, fever and unexpected weight change.  HENT:   Negative for mouth sores, nosebleeds, sore throat and trouble swallowing.   Eyes: Negative for eye problems and icterus.  Respiratory: Negative for cough, hemoptysis, shortness of breath and wheezing.   Cardiovascular: Negative for chest pain and leg swelling.  Gastrointestinal: Negative for abdominal pain, constipation, diarrhea, nausea and vomiting.  Genitourinary: Negative for bladder incontinence, difficulty urinating, dysuria, frequency and hematuria.   Musculoskeletal: Negative for back pain, gait problem, neck pain and neck stiffness.  Skin: Negative for itching and rash.  Neurological: Negative for dizziness, extremity weakness, gait problem, headaches, light-headedness and seizures.   Hematological: Negative for adenopathy. Does not bruise/bleed easily.  Psychiatric/Behavioral: Negative for confusion, depression and sleep disturbance. The patient is not nervous/anxious.     PHYSICAL EXAMINATION:  There were no vitals taken for this visit.  ECOG PERFORMANCE STATUS: {CHL ONC ECOG [id number]  Physical Exam  Constitutional: Oriented to person, place, and time and well-developed, well-nourished, and in no distress. No distress.  HENT:  Head: Normocephalic and atraumatic.  Mouth/Throat: Oropharynx is clear and moist. No oropharyngeal exudate.  Eyes: Conjunctivae are normal. Right eye exhibits no discharge. Left eye exhibits no discharge. No scleral icterus.  Neck: Normal range of motion. Neck supple.  Cardiovascular: Normal rate, regular rhythm, normal heart sounds and intact distal pulses.   Pulmonary/Chest: Effort normal and breath sounds normal. No respiratory distress. No wheezes. No rales.  Abdominal: Soft. Bowel sounds are normal. Exhibits no distension and no mass. There is no tenderness.  Musculoskeletal: Normal range of motion. Exhibits no edema.  Lymphadenopathy:    No cervical adenopathy.  Neurological: Alert and oriented to person, place, and time. Exhibits normal muscle tone. Gait normal. Coordination normal.  Skin: Skin is warm and dry. No rash noted. Not diaphoretic. No erythema. No pallor.  Psychiatric: Mood, memory and judgment normal.  Vitals reviewed.  LABORATORY DATA: Lab Results  Component Value Date   WBC 5.3 08/17/2021   HGB 9.2 (L) 08/17/2021   HCT 30.3 (L) 08/17/2021   MCV 91.8 08/17/2021   PLT 215 08/17/2021      Chemistry      Component Value Date/Time   NA 141 09/14/2021 1446   K 4.9 09/14/2021 1446   CL 97 09/14/2021 1446   CO2 28 09/14/2021 1446   BUN 32 (H) 09/14/2021 1446   CREATININE 1.03 (H) 09/14/2021 1446   CREATININE 0.83 05/05/2020 1357      Component Value Date/Time   CALCIUM 9.9 09/14/2021 1446   ALKPHOS 46  08/15/2021 0420   AST 14 (L) 08/15/2021 0420   AST 14 (L) 05/05/2020 1357   ALT 10 08/15/2021 0420   ALT <6 05/05/2020 1357   BILITOT 1.2 08/15/2021 0420   BILITOT 0.4 05/05/2020 1357       RADIOGRAPHIC STUDIES:  No results found.   ASSESSMENT/PLAN:  No problem-specific Assessment & Plan notes found for this encounter.   No orders of the defined types were placed in this encounter.    I spent {CHL ONC TIME VISIT - JQZES:[id number]} counseling the patient face to face. The total time spent in the appointment was {CHL ONC TIME VISIT - QJFHL:[id number]}.  Kyara Boxer L Taite Schoeppner, PA-C 02/20/22

## 2022-02-21 ENCOUNTER — Telehealth: Payer: Self-pay

## 2022-02-21 ENCOUNTER — Other Ambulatory Visit: Payer: Self-pay

## 2022-02-21 NOTE — Telephone Encounter (Signed)
This patient called and stated that she was returning a call.  Patient was made aware that she needs to have her scan first.  Patient was provided with the number for Central Scheduling.  We advised that once the patient has her scan scheduled will set an appointment for the office visit.  Patient.  Acknowledged understanding.  No further questions noted.

## 2022-02-23 ENCOUNTER — Ambulatory Visit: Payer: Medicare Other | Admitting: Physician Assistant

## 2022-03-03 ENCOUNTER — Telehealth: Payer: Self-pay | Admitting: *Deleted

## 2022-03-03 NOTE — Telephone Encounter (Signed)
Received PC from patient - she left VM saying she is sick, has a sore throat, thinks she has the flu & she will schedule her CT scan when she is feeling better, then she will contact [facility] for an appointment here.

## 2022-03-15 ENCOUNTER — Telehealth: Payer: Self-pay | Admitting: Nurse Practitioner

## 2022-03-15 NOTE — Telephone Encounter (Signed)
Per 3/6 IB scheduled patient , patient aware of time and date of appointment.

## 2022-03-31 ENCOUNTER — Ambulatory Visit (HOSPITAL_COMMUNITY)
Admission: RE | Admit: 2022-03-31 | Discharge: 2022-03-31 | Disposition: A | Discharge status: Home / Self Care | Payer: 59 | Source: Ambulatory Visit | Attending: Physician Assistant | Admitting: Physician Assistant

## 2022-03-31 DIAGNOSIS — C3492 Malignant neoplasm of unspecified part of left bronchus or lung: Secondary | ICD-10-CM | POA: Diagnosis present

## 2022-04-03 NOTE — Progress Notes (Unsigned)
[facility] OFFICE PROGRESS NOTE  [name], NP [address] [place] [address]  DIAGNOSIS: Stage IIb/IIIa (T3, N0/N1, M0) non-small cell lung cancer poorly differentiated squamous cell carcinoma diagnosed in August 2019.  The patient presented with large superior segment left lower lobe mass with questionable left hilar adenopathy   PRIOR THERAPY: 1) Concurrent chemoradiation with weekly carboplatin for AUC of 2 and paclitaxel 45 mg/M2.  Status post 7 cycles.  Last dose was given October 29, 2017 with partial response 2) Consolidation treatment with immunotherapy with Imfinzi 10 mg/KG every 2 weeks.  Status post 25 cycles. Last dose 12/12/2018  CURRENT THERAPY: Observation  INTERVAL HISTORY: Sherry [name] 77 y.o. female returns to the clinic today for a follow-up visit. The patient was last seen in the clinic in July 2023.  The patient has occasionally been getting lost to follow-up and delaying her appointments due to having a lot of stresses in her personal life. Her daughter passed away unexpected this past Saturday.   In summary with her lung cancer, the patient completed concurrent chemoradiation and 1 year of immunotherapy in December 2020.  She has been on observation since that time with repeat imaging every 6 months or so.  Overall, she is feeling well. She is very cachetic but has gained almost 20 lbs back.  She said that she has a good appetite.  She denies any fever, chills, or night sweats.  She is wearing 2 L of supplemental oxygen but she reports that her breathing is "good".  She states that when she is active at home such as washing the dishes or vacuuming that she does not experience any significant shortness of breath.  She states that she coughs "some" but nothing persistent.  Denies any chest pain or hemoptysis.  Denies any nausea, vomiting, diarrhea, or constipation.  Denies any headache or visual changes.  She recently had a restaging CT  scan performed.  She is here today for evaluation and to review her scan results.   MEDICAL HISTORY: Past Medical History:  Diagnosis Date   Allergy    Anxiety    Arthritis    Cancer ([facility])    Cavitating mass in left lower lung lobe    Complication of anesthesia    woke up during procedure   COPD (chronic obstructive pulmonary disease) ([facility])    not on home O2   Depression    Family history of adverse reaction to anesthesia    " my mother was always hard to wake up"   Full dentures    Headache    Neuromuscular disorder ([facility])    Pneumonia    Thoracic spine fracture ([facility]) 11/01/2017   T9   Wears glasses     ALLERGIES:  is allergic to omnipaque [iohexol] and penicillins.  MEDICATIONS:  Current Outpatient Medications  Medication Sig Dispense Refill   acetaminophen (TYLENOL 8 HOUR) 650 MG CR tablet Take 1 tablet (650 mg total) by mouth every 8 (eight) hours as needed for pain or fever. 30 tablet 0   albuterol (VENTOLIN HFA) 108 (90 Base) MCG/ACT inhaler Inhale 2 puffs into the lungs every 6 (six) hours as needed for wheezing or shortness of breath. 8.5 g 1   carboxymethylcellulose (REFRESH PLUS) 0.5 % SOLN Place 1 drop into both eyes daily as needed (dry eyes).     Ergocalciferol (VITAMIN D2 PO) Take 1 Capful by mouth daily.     Fluticasone-Umeclidin-Vilant (TRELEGY ELLIPTA) 100-62.5-25  MCG/ACT AEPB Inhale 1 Inhalation into the lungs daily. 60 each 5   HYDROcodone-acetaminophen (NORCO/VICODIN) 5-325 MG tablet Take 1 tablet by mouth every 6 (six) hours as needed for moderate pain.     meclizine (ANTIVERT) 12.5 MG tablet Take 1 tablet (12.5 mg total) by mouth 2 (two) times daily as needed for dizziness (vertigo). 30 tablet 0   SERTRALINE HCL PO Take 1 tablet by mouth daily.     sodium chloride (OCEAN) 0.65 % SOLN nasal spray Place 1 spray into both nostrils daily as needed for congestion.     spironolactone (ALDACTONE) 25 MG tablet Take 1/2 tablet (12.5 mg total) by mouth daily. 15  tablet 0   tamsulosin (FLOMAX) 0.4 MG CAPS capsule Take 0.4 mg at bedtime 30 capsule    torsemide (DEMADEX) 20 MG tablet Take 1 tablet (20 mg total) by mouth daily. 30 tablet 0   No current facility-administered medications for this visit.    SURGICAL HISTORY:  Past Surgical History:  Procedure Laterality Date   ELBOW SURGERY     for  "Tennis Elbow"   hand surgery     IR KYPHO THORACIC WITH BONE BIOPSY  11/27/2017   IR KYPHO THORACIC WITH BONE BIOPSY  03/01/2018   IR KYPHO THORACIC WITH BONE BIOPSY  03/26/2018   IR RADIOLOGIST EVAL & MGMT  11/20/2017   IR RADIOLOGIST EVAL & MGMT  02/26/2018   IR RADIOLOGIST EVAL & MGMT  03/13/2018   MULTIPLE TOOTH EXTRACTIONS     TUBAL LIGATION     VIDEO BRONCHOSCOPY N/A 08/31/2017   Procedure: VIDEO BRONCHOSCOPY;  Surgeon: Melrose Nakayama, MD;  Location: [facility] OR;  Service: Thoracic;  Laterality: N/A;    REVIEW OF SYSTEMS:   Review of Systems  Constitutional: Negative for appetite change, chills, fatigue, fever and unexpected weight change.  HENT: Negative for mouth sores, nosebleeds, sore throat and trouble swallowing.   Eyes: Negative for eye problems and icterus.  Respiratory: Negative for significant cough, hemoptysis, shortness of breath and wheezing.   Cardiovascular: Negative for chest pain and leg swelling.  Gastrointestinal: Negative for abdominal pain, constipation, diarrhea, nausea and vomiting.  Genitourinary: Negative for bladder incontinence, difficulty urinating, dysuria, frequency and hematuria.   Musculoskeletal: Negative for back pain, gait problem, neck pain and neck stiffness.  Skin: Negative for itching and rash.  Neurological: Negative for dizziness, extremity weakness, gait problem, headaches, light-headedness and seizures.  Hematological: Negative for adenopathy. Does not bruise/bleed easily.  Psychiatric/Behavioral: Negative for confusion, depression and sleep disturbance. The patient is not nervous/anxious.      PHYSICAL EXAMINATION:  Blood pressure (!) 96/58, pulse (!) 102, temperature 98.5 F (36.9 C), temperature source Oral, resp. rate 16, height 5' (1.524 m), weight 82 lb 6.4 oz (37.4 kg), SpO2 99 %, peak flow (!) 2 L/min.  ECOG PERFORMANCE STATUS: 1  Physical Exam  Constitutional: Oriented to person, place, and time and cachetic appearing female and in no distress.  HENT:  Head: Normocephalic and atraumatic.  Mouth/Throat: Oropharynx is clear and moist. No oropharyngeal exudate.  Eyes: Conjunctivae are normal. Right eye exhibits no discharge. Left eye exhibits no discharge. No scleral icterus.  Neck: Normal range of motion. Neck supple.  Cardiovascular: Normal rate, regular rhythm, normal heart sounds and intact distal pulses.   Pulmonary/Chest: Effort normal and breath sounds normal. No respiratory distress. No wheezes. No rales.  On 2 L of supplemental oxygen. Abdominal: Soft. Bowel sounds are normal. Exhibits no distension and no mass. There is no  tenderness.  Musculoskeletal: Normal range of motion. Exhibits no edema.  Lymphadenopathy:    No cervical adenopathy.  Neurological: Alert and oriented to person, place, and time.Positive for muscle wasting. Gait normal. Coordination normal.  Skin: Skin is warm and dry. No rash noted. Not diaphoretic. No erythema. No pallor.  Psychiatric: Mood, memory and judgment normal.  Vitals reviewed.  LABORATORY DATA: Lab Results  Component Value Date   WBC 5.3 08/17/2021   HGB 9.2 (L) 08/17/2021   HCT 30.3 (L) 08/17/2021   MCV 91.8 08/17/2021   PLT 215 08/17/2021      Chemistry      Component Value Date/Time   NA 141 09/14/2021 1446   K 4.9 09/14/2021 1446   CL 97 09/14/2021 1446   CO2 28 09/14/2021 1446   BUN 32 (H) 09/14/2021 1446   CREATININE 1.03 (H) 09/14/2021 1446   CREATININE 0.83 05/05/2020 1357      Component Value Date/Time   CALCIUM 9.9 09/14/2021 1446   ALKPHOS 46 08/15/2021 0420   AST 14 (L) 08/15/2021 0420   AST  14 (L) 05/05/2020 1357   ALT 10 08/15/2021 0420   ALT <6 05/05/2020 1357   BILITOT 1.2 08/15/2021 0420   BILITOT 0.4 05/05/2020 1357       RADIOGRAPHIC STUDIES:  CT Chest Wo Contrast  Result Date: 04/03/2022 CLINICAL DATA:  Assess treatment response left lung stage III squamous cell carcinoma. * Tracking Code: BO * EXAM: CT CHEST WITHOUT CONTRAST TECHNIQUE: Multidetector CT imaging of the chest was performed following the standard protocol without IV contrast. RADIATION DOSE REDUCTION: This exam was performed according to the departmental dose-optimization program which includes automated exposure control, adjustment of the mA and/or kV according to patient size and/or use of iterative reconstruction technique. COMPARISON:  CT angiogram chest 06/21/2021. Multiple older CT scans as well. FINDINGS: Cardiovascular: On this non IV contrast exam, the heart is nonenlarged. No pericardial effusion. Scattered vascular calcifications along the thoracic aorta. Few coronary artery calcifications are seen. Mediastinum/Nodes: Small thyroid gland. Slightly patulous thoracic esophagus. On this non IV contrast exam there is no specific abnormal lymph node enlargement present in the axillary regions, hilum or mediastinum. Only a few small less than 1 cm in size nodes are seen, nonpathologic by size criteria. Lungs/Pleura: Advanced emphysematous lung changes identified. Bilateral apical pleural thickening. There also some areas of bronchiectasis. In the right lung in the superior segment of the right lower lobe is a focal nodule. On series 7, image 60 this measures 8 by 7 mm. On the most recent exam of June 2023 this have measured 7 x 6 mm. And going back to older study of September 2021 this measured 7 mm. Long-term stability. There is a small left-sided pleural effusion with volume loss along the left lower lobe with bronchiectasis and distortion. There is also some similar changes perihilar left upper lobe towards the  lingula associated areas of pleural thickening. Lung opacities themselves are similar to the previous examinations. The effusion is decreased significantly overall but there are some increasing loculated areas anteriorly. Overall volume loss of the left hemithorax. Again areas of some bronchiectasis with some opacity along the bronchi, likely mucous plugging. Upper Abdomen: In the upper abdomen the adrenal glands are grossly preserved. Few hepatic granulomas. Musculoskeletal: Augmentation cement identified at multiple levels of the thoracic spine as on the previous examination including T9 through T11. Stable compression deformities and trace retrolisthesis of T11 on T12. IMPRESSION: Improving pleural effusions from prior CT scan  from June 2023. There is some residual areas of fluid which appears loculated along the anterior left hemithorax the level of the heart. The areas of lung opacity and distortion in the left lung and volume loss is similar to previous examination. Stable right lower lobe lung nodule which has been present since at least 2021 demonstrating long-term stability. No developing new mass lesion or lymph node enlargement. Aortic Atherosclerosis (ICD10-I70.0) and Emphysema (ICD10-J43.9). Electronically Signed   By: Jill Side M.D.   On: 04/03/2022 14:21     ASSESSMENT/PLAN:  This is a very pleasant 77 year old Caucasian female with a history of stage IIIa non-small cell lung cancer, poorly differentiated squamous cell carcinoma.  She presented with a large superior segment of the left lower lobe mass with questionable left hilar adenopathy.  She was diagnosed in August 2019.    The patient underwent a course of concurrent chemoradiation with carboplatin for an AUC of 2 and paclitaxel 45 mg/m. Status post 7 cycles with a partial response.   The patient is status post 25 cycles of consolidation immunotherapy with Imfinzi 10 mg/kg IV every 2 weeks.  This was completed in December  2020.  The patient was seen with Dr. Julien Nordmann today.  Dr. Julien Nordmann personally and independently reviewed her CT scan results and discussed results with the patient today.  The scan showed no evidence of disease progression.  Dr. Julien Nordmann recommends she continue on observation with a restaging CT scan in 6 months.  She has an allergy to IV contrast.  Therefore we will order the scan without contrast.  We will see the patient back for follow-up visit in 6 months to review her scan at that time.  The patient was advised to call immediately if she has any concerning symptoms in the interval. The patient voices understanding of current disease status and treatment options and is in agreement with the current care plan. All questions were answered. The patient knows to call the clinic with any problems, questions or concerns. We can certainly see the patient much sooner if necessary   Orders Placed This Encounter  Procedures   CT Chest Wo Contrast    Standing Status:   Future    Standing Expiration Date:   04/05/2023    Order Specific Question:   Preferred imaging location?    Answer:   [facility]   CBC with Differential ([facility] Only)    Standing Status:   Future    Standing Expiration Date:   04/05/2023   CMP ([facility] only)    Standing Status:   Future    Standing Expiration Date:   04/05/2023       Tobe Sos Jeydi Klingel, PA-C 04/05/22  ADDENDUM: Hematology/Oncology Attending: I had a face-to-face encounter with the patient today.  I reviewed her records, lab, scan and recommended her care plan.  This is a very pleasant 77 years old white female with history of stage IIIa (T3, N1, M0) non-small cell lung cancer, squamous cell carcinoma diagnosed in August 2019 status post a course of concurrent chemoradiation followed by consolidation treatment with immunotherapy with Imfinzi completed in December 2020.  The patient has been in observation since that time and she  is feeling fine with no concerning complaints except for the baseline shortness of breath increased with exertion.  Unfortunately she lost her daughter recently. She had repeat CT scan of the chest performed recently.  I personally and independently reviewed the scan and discussed the result with the patient  today. Her scan showed no concerning findings for disease recurrence or metastasis. I recommended for her to continue on observation with repeat CT scan of the chest in 6 months. She was advised to call immediately if she has any other concerning symptoms in the interval. The total time spent in the appointment was 20 minutes. Disclaimer: This note was dictated with voice recognition software. Similar sounding words can inadvertently be transcribed and may be missed upon review. Eilleen Kempf, MD

## 2022-04-05 ENCOUNTER — Other Ambulatory Visit: Payer: Self-pay

## 2022-04-05 ENCOUNTER — Inpatient Hospital Stay: Payer: 59 | Attending: Physician Assistant | Admitting: Physician Assistant

## 2022-04-05 VITALS — BP 96/58 | HR 102 | Temp 98.5°F | Resp 16 | Ht 60.0 in | Wt 82.4 lb

## 2022-04-05 DIAGNOSIS — R0602 Shortness of breath: Secondary | ICD-10-CM | POA: Diagnosis not present

## 2022-04-05 DIAGNOSIS — J90 Pleural effusion, not elsewhere classified: Secondary | ICD-10-CM | POA: Insufficient documentation

## 2022-04-05 DIAGNOSIS — C3432 Malignant neoplasm of lower lobe, left bronchus or lung: Secondary | ICD-10-CM

## 2022-04-05 DIAGNOSIS — M4854XA Collapsed vertebra, not elsewhere classified, thoracic region, initial encounter for fracture: Secondary | ICD-10-CM | POA: Diagnosis not present

## 2022-04-05 DIAGNOSIS — J449 Chronic obstructive pulmonary disease, unspecified: Secondary | ICD-10-CM | POA: Diagnosis not present

## 2022-04-05 DIAGNOSIS — Z9221 Personal history of antineoplastic chemotherapy: Secondary | ICD-10-CM | POA: Insufficient documentation

## 2022-04-05 DIAGNOSIS — Z923 Personal history of irradiation: Secondary | ICD-10-CM | POA: Insufficient documentation

## 2022-10-05 ENCOUNTER — Telehealth: Payer: Self-pay | Admitting: Medical Oncology

## 2022-10-05 NOTE — Telephone Encounter (Signed)
Cancelled appts -She is sick with a sore throat and due to weather conditions tomorrow  she does not feel safe driving. Schedule message sent to r/s .

## 2022-10-06 ENCOUNTER — Other Ambulatory Visit: Payer: 59

## 2022-10-06 ENCOUNTER — Ambulatory Visit (HOSPITAL_COMMUNITY): Payer: 59

## 2022-10-10 ENCOUNTER — Ambulatory Visit: Payer: 59 | Admitting: Internal Medicine

## 2022-10-20 ENCOUNTER — Telehealth: Payer: Self-pay | Admitting: Medical Oncology

## 2022-10-20 ENCOUNTER — Telehealth: Payer: Self-pay | Admitting: Physician Assistant

## 2022-10-20 NOTE — Telephone Encounter (Signed)
Second attempt to call patient today.  Left voicemail instructing her to call radiology at [phone] to schedule her CT scan.  Once her CT scan is scheduled I recommend she call us back at [phone] to reschedule her follow-up visit approximately 7 to 10 days after her CT scan.  It is unlikely that she will have her CT scan before the follow-up visit which is currently scheduled for 10/25/2022.  Therefore, she likely will not need this appointment since there will not be any results to review at that time.  I left our callback number should she have any questions

## 2022-10-20 NOTE — Progress Notes (Deleted)
[facility] OFFICE PROGRESS NOTE  Sherry Hansen, NP [address] [place] [address]  DIAGNOSIS: Stage IIb/IIIa (T3, N0/N1, M0) non-small cell lung cancer poorly differentiated squamous cell carcinoma diagnosed in August 2019.  The patient presented with large superior segment left lower lobe mass with questionable left hilar adenopathy   PRIOR THERAPY: 1) Concurrent chemoradiation with weekly carboplatin for AUC of 2 and paclitaxel 45 mg/M2.  Status post 7 cycles.  Last dose was given October 29, 2017 with partial response 2) Consolidation treatment with immunotherapy with Imfinzi 10 mg/KG every 2 weeks.  Status post 25 cycles. Last dose 12/12/2018  CURRENT THERAPY: Observation   INTERVAL HISTORY: Sherry [name] 77 y.o. female returns to the clinic today for a follow-up visit. The patient was last seen in the clinic in March 2024.  he patient has occasionally been getting lost to follow-up and delaying her appointments due to having a lot of stresses in her personal life.   In summary with her lung cancer, the patient completed concurrent chemoradiation and 1 year of immunotherapy in December 2020.  She has been on observation since that time with repeat imaging every 6 months or so.   Overall, she is feeling well. She is very cachetic but ***. She said that she has a good appetite. She denies any fever, chills, or night sweats. She is wearing 2 L of supplemental oxygen but she reports that her breathing is "***". Shortness of breath with exertion ***.  MEDICAL HISTORY: Past Medical History:  Diagnosis Date   Allergy    Anxiety    Arthritis    Cancer ([facility])    Cavitating mass in left lower lung lobe    Complication of anesthesia    woke up during procedure   COPD (chronic obstructive pulmonary disease) ([facility])    not on home O2   Depression    Family history of adverse reaction to anesthesia    " my mother was always hard to wake up"   Full dentures     Headache    Neuromuscular disorder ([facility])    Pneumonia    Thoracic spine fracture ([facility]) 11/01/2017   T9   Wears glasses     ALLERGIES:  is allergic to omnipaque [iohexol] and penicillins.  MEDICATIONS:  Current Outpatient Medications  Medication Sig Dispense Refill   acetaminophen (TYLENOL 8 HOUR) 650 MG CR tablet Take 1 tablet (650 mg total) by mouth every 8 (eight) hours as needed for pain or fever. 30 tablet 0   albuterol (VENTOLIN HFA) 108 (90 Base) MCG/ACT inhaler Inhale 2 puffs into the lungs every 6 (six) hours as needed for wheezing or shortness of breath. 8.5 g 1   carboxymethylcellulose (REFRESH PLUS) 0.5 % SOLN Place 1 drop into both eyes daily as needed (dry eyes).     Ergocalciferol (VITAMIN D2 PO) Take 1 Capful by mouth daily.     Fluticasone-Umeclidin-Vilant (TRELEGY ELLIPTA) 100-62.5-25 MCG/ACT AEPB Inhale 1 Inhalation into the lungs daily. 60 each 5   HYDROcodone-acetaminophen (NORCO/VICODIN) 5-325 MG tablet Take 1 tablet by mouth every 6 (six) hours as needed for moderate pain.     meclizine (ANTIVERT) 12.5 MG tablet Take 1 tablet (12.5 mg total) by mouth 2 (two) times daily as needed for dizziness (vertigo). 30 tablet 0   SERTRALINE HCL PO Take 1 tablet by mouth daily.     sodium chloride (OCEAN) 0.65 % SOLN nasal spray Place 1 spray into both  nostrils daily as needed for congestion.     spironolactone (ALDACTONE) 25 MG tablet Take 1/2 tablet (12.5 mg total) by mouth daily. 15 tablet 0   tamsulosin (FLOMAX) 0.4 MG CAPS capsule Take 0.4 mg at bedtime 30 capsule    torsemide (DEMADEX) 20 MG tablet Take 1 tablet (20 mg total) by mouth daily. 30 tablet 0   No current facility-administered medications for this visit.    SURGICAL HISTORY:  Past Surgical History:  Procedure Laterality Date   ELBOW SURGERY     for  "Tennis Elbow"   hand surgery     IR KYPHO THORACIC WITH BONE BIOPSY  11/27/2017   IR KYPHO THORACIC WITH BONE BIOPSY  03/01/2018   IR KYPHO THORACIC WITH  BONE BIOPSY  03/26/2018   IR RADIOLOGIST EVAL & MGMT  11/20/2017   IR RADIOLOGIST EVAL & MGMT  02/26/2018   IR RADIOLOGIST EVAL & MGMT  03/13/2018   MULTIPLE TOOTH EXTRACTIONS     TUBAL LIGATION     VIDEO BRONCHOSCOPY N/A 08/31/2017   Procedure: VIDEO BRONCHOSCOPY;  Surgeon: Loreli Slot, MD;  Location: [facility] OR;  Service: Thoracic;  Laterality: N/A;    REVIEW OF SYSTEMS:   Review of Systems  Constitutional: Negative for appetite change, chills, fatigue, fever and unexpected weight change.  HENT:   Negative for mouth sores, nosebleeds, sore throat and trouble swallowing.   Eyes: Negative for eye problems and icterus.  Respiratory: Negative for cough, hemoptysis, shortness of breath and wheezing.   Cardiovascular: Negative for chest pain and leg swelling.  Gastrointestinal: Negative for abdominal pain, constipation, diarrhea, nausea and vomiting.  Genitourinary: Negative for bladder incontinence, difficulty urinating, dysuria, frequency and hematuria.   Musculoskeletal: Negative for back pain, gait problem, neck pain and neck stiffness.  Skin: Negative for itching and rash.  Neurological: Negative for dizziness, extremity weakness, gait problem, headaches, light-headedness and seizures.  Hematological: Negative for adenopathy. Does not bruise/bleed easily.  Psychiatric/Behavioral: Negative for confusion, depression and sleep disturbance. The patient is not nervous/anxious.     PHYSICAL EXAMINATION:  There were no vitals taken for this visit.  ECOG PERFORMANCE STATUS: {CHL ONC ECOG [id number]  Physical Exam  Constitutional: Oriented to person, place, and time and well-developed, well-nourished, and in no distress. No distress.  HENT:  Head: Normocephalic and atraumatic.  Mouth/Throat: Oropharynx is clear and moist. No oropharyngeal exudate.  Eyes: Conjunctivae are normal. Right eye exhibits no discharge. Left eye exhibits no discharge. No scleral icterus.  Neck: Normal range  of motion. Neck supple.  Cardiovascular: Normal rate, regular rhythm, normal heart sounds and intact distal pulses.   Pulmonary/Chest: Effort normal and breath sounds normal. No respiratory distress. No wheezes. No rales.  Abdominal: Soft. Bowel sounds are normal. Exhibits no distension and no mass. There is no tenderness.  Musculoskeletal: Normal range of motion. Exhibits no edema.  Lymphadenopathy:    No cervical adenopathy.  Neurological: Alert and oriented to person, place, and time. Exhibits normal muscle tone. Gait normal. Coordination normal.  Skin: Skin is warm and dry. No rash noted. Not diaphoretic. No erythema. No pallor.  Psychiatric: Mood, memory and judgment normal.  Vitals reviewed.  LABORATORY DATA: Lab Results  Component Value Date   WBC 5.3 08/17/2021   HGB 9.2 (L) 08/17/2021   HCT 30.3 (L) 08/17/2021   MCV 91.8 08/17/2021   PLT 215 08/17/2021      Chemistry      Component Value Date/Time   NA 141 09/14/2021 1446  K 4.9 09/14/2021 1446   CL 97 09/14/2021 1446   CO2 28 09/14/2021 1446   BUN 32 (H) 09/14/2021 1446   CREATININE 1.03 (H) 09/14/2021 1446   CREATININE 0.83 05/05/2020 1357      Component Value Date/Time   CALCIUM 9.9 09/14/2021 1446   ALKPHOS 46 08/15/2021 0420   AST 14 (L) 08/15/2021 0420   AST 14 (L) 05/05/2020 1357   ALT 10 08/15/2021 0420   ALT <6 05/05/2020 1357   BILITOT 1.2 08/15/2021 0420   BILITOT 0.4 05/05/2020 1357       RADIOGRAPHIC STUDIES:  No results found.   ASSESSMENT/PLAN:  No problem-specific Assessment & Plan notes found for this encounter.   No orders of the defined types were placed in this encounter.    I spent {CHL ONC TIME VISIT - ZOXWR:[id number]} counseling the patient face to face. The total time spent in the appointment was {CHL ONC TIME VISIT - BJYNW:[id number]}.  Stephanieann Popescu L Tylor Gambrill, PA-C 10/20/22

## 2022-10-20 NOTE — Telephone Encounter (Signed)
Appts confirmed with pt.  

## 2022-10-20 NOTE — Telephone Encounter (Signed)
I am scheduled to see this patient next week on 10/25/22 who is well-known to me.  She is currently on observation for her history of lung cancer.  She is followed with surveillance imaging.  However, her restaging CT scan that was ordered in March has not been scheduled or completed.  Therefore, she will not need her appointment on 10/16 until 7-10 days after she repeats her CT scan.  I attempted to call the patient.  I left a voicemail instructing her to call radiology scheduling to schedule her CT scan.  After her CT scan is scheduled, we would like to see her in the clinic 7 to 10 days later to review the results.  I left our callback number should she have any questions.  I will try calling her again later.

## 2022-10-23 ENCOUNTER — Telehealth: Payer: Self-pay | Admitting: Internal Medicine

## 2022-10-23 NOTE — Telephone Encounter (Signed)
Scheduled per 10/08 scheduling message, patient has been called and notified of upcoming appointments.

## 2022-10-25 ENCOUNTER — Ambulatory Visit: Payer: 59 | Admitting: Physician Assistant

## 2022-10-27 ENCOUNTER — Inpatient Hospital Stay: Payer: 59 | Attending: Physician Assistant

## 2022-10-27 ENCOUNTER — Ambulatory Visit (HOSPITAL_COMMUNITY)
Admission: RE | Admit: 2022-10-27 | Discharge: 2022-10-27 | Disposition: A | Discharge status: Home / Self Care | Payer: 59 | Source: Ambulatory Visit | Attending: Physician Assistant | Admitting: Physician Assistant

## 2022-10-27 ENCOUNTER — Other Ambulatory Visit: Payer: Self-pay

## 2022-10-27 DIAGNOSIS — C3432 Malignant neoplasm of lower lobe, left bronchus or lung: Secondary | ICD-10-CM | POA: Insufficient documentation

## 2022-10-27 DIAGNOSIS — Z923 Personal history of irradiation: Secondary | ICD-10-CM | POA: Insufficient documentation

## 2022-10-27 DIAGNOSIS — J90 Pleural effusion, not elsewhere classified: Secondary | ICD-10-CM | POA: Diagnosis not present

## 2022-10-27 DIAGNOSIS — J449 Chronic obstructive pulmonary disease, unspecified: Secondary | ICD-10-CM | POA: Diagnosis not present

## 2022-10-27 DIAGNOSIS — Z9221 Personal history of antineoplastic chemotherapy: Secondary | ICD-10-CM | POA: Insufficient documentation

## 2022-10-27 DIAGNOSIS — R0602 Shortness of breath: Secondary | ICD-10-CM | POA: Insufficient documentation

## 2022-10-27 DIAGNOSIS — M4854XD Collapsed vertebra, not elsewhere classified, thoracic region, subsequent encounter for fracture with routine healing: Secondary | ICD-10-CM | POA: Diagnosis not present

## 2022-10-27 LAB — CMP (CANCER CENTER ONLY)
ALT: 7 U/L (ref 0–44)
AST: 17 U/L (ref 15–41)
Albumin: 4.1 g/dL (ref 3.5–5.0)
Alkaline Phosphatase: 104 U/L (ref 38–126)
Anion gap: 10 (ref 5–15)
BUN: 23 mg/dL (ref 8–23)
CO2: 36 mmol/L — ABNORMAL HIGH (ref 22–32)
Calcium: 9.8 mg/dL (ref 8.9–10.3)
Chloride: 95 mmol/L — ABNORMAL LOW (ref 98–111)
Creatinine: 1.6 mg/dL — ABNORMAL HIGH (ref 0.44–1.00)
GFR, Estimated: 33 mL/min — ABNORMAL LOW (ref >60)
Glucose, Bld: 94 mg/dL (ref 70–99)
Potassium: 3.4 mmol/L — ABNORMAL LOW (ref 3.5–5.1)
Sodium: 141 mmol/L (ref 135–145)
Total Bilirubin: 0.4 mg/dL (ref 0.3–1.2)
Total Protein: 7.8 g/dL (ref 6.5–8.1)

## 2022-10-27 LAB — CBC WITH DIFFERENTIAL (CANCER CENTER ONLY)
Abs Immature Granulocytes: 0.03 10*3/uL (ref 0.00–0.07)
Basophils Absolute: 0.1 10*3/uL (ref 0.0–0.1)
Basophils Relative: 1 %
Eosinophils Absolute: 0.1 10*3/uL (ref 0.0–0.5)
Eosinophils Relative: 1 %
HCT: 36.3 % (ref 36.0–46.0)
Hemoglobin: 11.9 g/dL — ABNORMAL LOW (ref 12.0–15.0)
Immature Granulocytes: 0 %
Lymphocytes Relative: 17 %
Lymphs Abs: 1.4 10*3/uL (ref 0.7–4.0)
MCH: 29.4 pg (ref 26.0–34.0)
MCHC: 32.8 g/dL (ref 30.0–36.0)
MCV: 89.6 fL (ref 80.0–100.0)
Monocytes Absolute: 0.7 10*3/uL (ref 0.1–1.0)
Monocytes Relative: 9 %
Neutro Abs: 5.7 10*3/uL (ref 1.7–7.7)
Neutrophils Relative %: 72 %
Platelet Count: 309 10*3/uL (ref 150–400)
RBC: 4.05 MIL/uL (ref 3.87–5.11)
RDW: 14.6 % (ref 11.5–15.5)
WBC Count: 7.9 10*3/uL (ref 4.0–10.5)
nRBC: 0 % (ref 0.0–0.2)

## 2022-10-30 ENCOUNTER — Telehealth: Payer: Self-pay

## 2022-10-30 NOTE — Telephone Encounter (Signed)
-----   Message from Cassandra L Heilingoetter sent at 10/30/2022  7:53 AM EDT ----- Ella Bodo, could you call her and tell her to increase her water intake? Additionally, she was overbooked during my new patient slot on Thursday this week 10/24. I send a message to scheduling about this but can you please let her know we will have to reschedule that appointment to next week. If you talk to her, if you can see what slot she wants next week on Tuesday, Wednesday, and Thursday. We can book it ourselves. ----- Message ----- From: Interface, [facility] [place] Sent: 10/27/2022   3:45 PM EDT To: Johnette Abraham Heilingoetter, PA-C

## 2022-10-30 NOTE — Telephone Encounter (Signed)
LM for pt that she needs to increase her fluid intake.  Appt was rescheduled from 10/24 to 10/30 at 8:00.  Included in msg to confirm appt date and time

## 2022-11-01 ENCOUNTER — Other Ambulatory Visit: Payer: 59

## 2022-11-01 ENCOUNTER — Telehealth: Payer: Self-pay | Admitting: Physician Assistant

## 2022-11-01 ENCOUNTER — Ambulatory Visit: Payer: 59 | Admitting: Physician Assistant

## 2022-11-01 NOTE — Telephone Encounter (Signed)
Patient is aware of scheduled appointment times/dates for follow up with [facility]

## 2022-11-02 ENCOUNTER — Ambulatory Visit: Payer: 59 | Admitting: Physician Assistant

## 2022-11-08 ENCOUNTER — Ambulatory Visit: Payer: 59 | Admitting: Physician Assistant

## 2022-11-14 NOTE — Progress Notes (Signed)
Sherry [name] HEMATOLOGY-ONCOLOGY TeleHEALTH VISIT PROGRESS NOTE   I connected with Sherry [name] on 11/20/22 at  3:00 PM EST by telephone and verified that I am speaking with the correct person using two identifiers.  I discussed the limitations, risks, security and privacy concerns of performing an evaluation and management service by telemedicine and the availability of in-person appointments. I also discussed with the patient that there may be a patient responsible charge related to this service. The patient expressed understanding and agreed to proceed.  Other persons participating in the visit and their role in the encounter: None  Patient's location: Home  Provider's location: Office   Sherry Hansen, NP [address] [place] [address]  DIAGNOSIS: Stage IIb/IIIa (T3, N0/N1, M0) non-small cell lung cancer poorly differentiated squamous cell carcinoma diagnosed in August 2019.  The patient presented with large superior segment left lower lobe mass with questionable left hilar adenopathy   PRIOR THERAPY:  1) Concurrent chemoradiation with weekly carboplatin for AUC of 2 and paclitaxel 45 mg/M2.  Status post 7 cycles.  Last dose was given October 29, 2017 with partial response 2) Consolidation treatment with immunotherapy with Imfinzi 10 mg/KG every 2 weeks.  Status post 25 cycles. Last dose 12/12/2018  CURRENT THERAPY: Observation   INTERVAL HISTORY: Sherry [name] 77 y.o. female and I connected via telephone visit today. The visit was converted to a telephone visit due to the patient having transportation issues today.  The patient was last seen in the clinic in March 2024.  In summary with her lung cancer, the patient completed concurrent chemoradiation and 1 year of immunotherapy in December 2020. She has been on observation since that time with repeat imaging every 6 months or so.   Overall she states she feels very well today.  She reports she has a lot  of energy and a good appetite.  Despite having a good appetite, she has never had much luck with gaining weight.  Denies any fever, chills, or night sweats.  She continues to wear 2 L of supplemental oxygen but reports that her breathing is "fine".  He denies any cough except related to allergies.  She has not taken any antihistamines.  Denies any chest pain or hemoptysis.  Denies any nausea, vomiting, diarrhea, or constipation.  Denies any headache or visual changes.  She recently had a restaging CT scan.  She is here today for evaluation and to review her scan results.  MEDICAL HISTORY: Past Medical History:  Diagnosis Date   Allergy    Anxiety    Arthritis    Cancer (HCC)    Cavitating mass in left lower lung lobe    Complication of anesthesia    woke up during procedure   COPD (chronic obstructive pulmonary disease) (HCC)    not on home O2   Depression    Family history of adverse reaction to anesthesia    " my mother was always hard to wake up"   Full dentures    Headache    Neuromuscular disorder (HCC)    Pneumonia    Thoracic spine fracture (HCC) 11/01/2017   T9   Wears glasses     ALLERGIES:  is allergic to omnipaque [iohexol] and penicillins.  MEDICATIONS:  Current Outpatient Medications  Medication Sig Dispense Refill   acetaminophen (TYLENOL 8 HOUR) 650 MG CR tablet Take 1 tablet (650 mg total) by mouth every 8 (eight) hours as needed for pain or  fever. 30 tablet 0   albuterol (VENTOLIN HFA) 108 (90 Base) MCG/ACT inhaler Inhale 2 puffs into the lungs every 6 (six) hours as needed for wheezing or shortness of breath. 8.5 g 1   carboxymethylcellulose (REFRESH PLUS) 0.5 % SOLN Place 1 drop into both eyes daily as needed (dry eyes).     Ergocalciferol (VITAMIN D2 PO) Take 1 Capful by mouth daily.     Fluticasone-Umeclidin-Vilant (TRELEGY ELLIPTA) 100-62.5-25 MCG/ACT AEPB Inhale 1 Inhalation into the lungs daily. 60 each 5   HYDROcodone-acetaminophen (NORCO/VICODIN) 5-325 MG  tablet Take 1 tablet by mouth every 6 (six) hours as needed for moderate pain.     meclizine (ANTIVERT) 12.5 MG tablet Take 1 tablet (12.5 mg total) by mouth 2 (two) times daily as needed for dizziness (vertigo). 30 tablet 0   SERTRALINE HCL PO Take 1 tablet by mouth daily.     sodium chloride (OCEAN) 0.65 % SOLN nasal spray Place 1 spray into both nostrils daily as needed for congestion.     spironolactone (ALDACTONE) 25 MG tablet Take 1/2 tablet (12.5 mg total) by mouth daily. 15 tablet 0   tamsulosin (FLOMAX) 0.4 MG CAPS capsule Take 0.4 mg at bedtime 30 capsule    torsemide (DEMADEX) 20 MG tablet Take 1 tablet (20 mg total) by mouth daily. 30 tablet 0   No current facility-administered medications for this visit.    SURGICAL HISTORY:  Past Surgical History:  Procedure Laterality Date   ELBOW SURGERY     for  "Tennis Elbow"   hand surgery     IR KYPHO THORACIC WITH BONE BIOPSY  11/27/2017   IR KYPHO THORACIC WITH BONE BIOPSY  03/01/2018   IR KYPHO THORACIC WITH BONE BIOPSY  03/26/2018   IR RADIOLOGIST EVAL & MGMT  11/20/2017   IR RADIOLOGIST EVAL & MGMT  02/26/2018   IR RADIOLOGIST EVAL & MGMT  03/13/2018   MULTIPLE TOOTH EXTRACTIONS     TUBAL LIGATION     VIDEO BRONCHOSCOPY N/A 08/31/2017   Procedure: VIDEO BRONCHOSCOPY;  Surgeon: Loreli Slot, MD;  Location: [facility] OR;  Service: Thoracic;  Laterality: N/A;    REVIEW OF SYSTEMS:   Review of Systems  Constitutional: Negative for appetite change, chills, fever and unexpected weight change.  HENT:   Negative for mouth sores, nosebleeds, sore throat and trouble swallowing.   Eyes: Negative for eye problems and icterus.  Respiratory: Stable dyspnea on exertion.  Negative for cough, hemoptysis,  and wheezing.   Cardiovascular: Negative for chest pain and leg swelling.  Gastrointestinal: Negative for abdominal pain, constipation, diarrhea, nausea and vomiting.  Genitourinary: Negative for bladder incontinence, difficulty urinating,  dysuria, frequency and hematuria.   Musculoskeletal: Negative for back pain, gait problem, neck pain and neck stiffness.  Skin: Negative for itching and rash.  Neurological: Negative for dizziness, extremity weakness, gait problem, headaches, light-headedness and seizures.  Hematological: Negative for adenopathy. Does not bruise/bleed easily.  Psychiatric/Behavioral: Negative for confusion, depression and sleep disturbance. The patient is not nervous/anxious.     PHYSICAL EXAMINATION:  There were no vitals taken for this visit.  ECOG PERFORMANCE STATUS: 1  Physical Exam  Constitutional: Oriented to person, place, and time.  Psychiatric: Mood, memory and judgment normal.  Vitals reviewed.  LABORATORY DATA: Lab Results  Component Value Date   WBC 7.9 10/27/2022   HGB 11.9 (L) 10/27/2022   HCT 36.3 10/27/2022   MCV 89.6 10/27/2022   PLT 309 10/27/2022      Chemistry  Component Value Date/Time   NA 141 10/27/2022 1534   NA 141 09/14/2021 1446   K 3.4 (L) 10/27/2022 1534   CL 95 (L) 10/27/2022 1534   CO2 36 (H) 10/27/2022 1534   BUN 23 10/27/2022 1534   BUN 32 (H) 09/14/2021 1446   CREATININE 1.60 (H) 10/27/2022 1534      Component Value Date/Time   CALCIUM 9.8 10/27/2022 1534   ALKPHOS 104 10/27/2022 1534   AST 17 10/27/2022 1534   ALT 7 10/27/2022 1534   BILITOT 0.4 10/27/2022 1534       RADIOGRAPHIC STUDIES:  CT Chest Wo Contrast  Result Date: 11/15/2022 CLINICAL DATA:  Non-small cell lung cancer, nonmetastatic, assess treatment response. * Tracking Code: BO * EXAM: CT CHEST WITHOUT CONTRAST TECHNIQUE: Multidetector CT imaging of the chest was performed following the standard protocol without IV contrast. RADIATION DOSE REDUCTION: This exam was performed according to the departmental dose-optimization program which includes automated exposure control, adjustment of the mA and/or kV according to patient size and/or use of iterative reconstruction technique.  COMPARISON:  03/31/2022 and 06/11/2018. FINDINGS: Cardiovascular: Atherosclerotic calcification of the aorta and left anterior descending coronary artery. Heart size normal. No pericardial effusion. Mediastinum/Nodes: Mediastinal lymph nodes measure up to 9 mm in the precarinal space, as before. Hilar regions are difficult to definitively evaluate without IV contrast. Air in the esophagus can be seen with dysmotility. Mildly complex prepericardiac cyst along the left ventricle measures 9 mm (2/103), new from the prior exam. Lungs/Pleura: Centrilobular emphysema. Biapical pleuroparenchymal scarring. Scattered mucoid impaction and peribronchovascular nodularity, similar. 7 mm nodule in the superior segment right lower lobe (6/62), stable from at least 06/11/2018 and considered benign. Post treatment traction bronchiectasis with collapse/consolidation primarily in the left lower lobe, unchanged. Small rind of loculated left pleural fluid/fibrothorax, unchanged. Airway is unremarkable. Upper Abdomen: Visualized portions of the liver, adrenal glands, kidneys, spleen, pancreas, stomach and bowel are grossly unremarkable. No upper abdominal adenopathy. Musculoskeletal: Degenerative changes in the spine. Vertebral body augmentations. Osteopenia. Old T12 compression fracture. IMPRESSION: 1. Post treatment pleuroparenchymal scarring in the left hemithorax. No evidence of recurrent or metastatic disease. 2. New minimally complex prepericardiac cyst along the left ventricle. Recommend attention on follow-up. 3. Aortic atherosclerosis (ICD10-I70.0). Left anterior descending coronary artery calcification. 4.  Emphysema (ICD10-J43.9). Electronically Signed   By: Leanna Battles M.D.   On: 11/15/2022 10:16     ASSESSMENT/PLAN:  This is a very pleasant 77 year old Caucasian female with a history of stage IIIa non-small cell lung cancer, poorly differentiated squamous cell carcinoma.  She presented with a large superior segment  of the left lower lobe mass with questionable left hilar adenopathy.  She was diagnosed in August 2019.    The patient underwent a course of concurrent chemoradiation with carboplatin for an AUC of 2 and paclitaxel 45 mg/m. Status post 7 cycles with a partial response.   The patient is status post 25 cycles of consolidation immunotherapy with Imfinzi 10 mg/kg IV every 2 weeks.  This was completed in December 2020.   The patient was seen with Dr. Arbutus Ped today.  Dr. Arbutus Ped personally and independently reviewed her CT scan results and discussed results with the patient today.   The scan showed no evidence of disease progression.   Dr. Arbutus Ped recommends she continue on observation with a restaging CT scan in 6 months.  She has an allergy to IV contrast.  Therefore we will order the scan without contrast.   We will see the  patient back for follow-up visit in 6 months to review her scan at that time.  I discussed the assessment and treatment plan with the patient. The patient was provided an opportunity to ask questions and all were answered. The patient agreed with the plan and demonstrated an understanding of the instructions.  The patient was advised to call back or seek an in-person evaluation if the symptoms worsen or if the condition fails to improve as anticipated.  I provided 15 minutes of non face-to-face telephone visit time during this encounter, and > 50% was spent counseling as documented under my assessment & plan.  Kwane Rohl L Saleah Rishel, PA-C 11/20/2022 2:07 PM  Orders Placed This Encounter  Procedures   CT Chest W Contrast    Standing Status:   Future    Standing Expiration Date:   11/20/2023    Order Specific Question:   If indicated for the ordered procedure, I authorize the administration of contrast media per Radiology protocol    Answer:   Yes    Order Specific Question:   Does the patient have a contrast media/X-ray dye allergy?    Answer:   No    Order Specific  Question:   Preferred imaging location?    Answer:   [facility]   CBC with Differential (Can[name] Only)    Standing Status:   Future    Standing Expiration Date:   11/20/2023   CMP (Cancer [name] only)    Standing Status:   Future    Standing Expiration Date:   11/20/2023     Johnette Abraham Berman Grainger, PA-C 11/20/22  ADDENDUM: Hematology/Oncology Attending: I contributed to the patient is virtual visit today.  I reviewed her records, lab, scan and recommended her care plan.  This is a very pleasant 77 years old white female with stage IIIa non-small cell lung cancer, squamous cell carcinoma diagnosed in August 2019 status post a course of concurrent chemoradiation followed by consolidation treatment with immunotherapy with Imfinzi completed in December 2020.  The patient has been on observation since that time.  She is feeling fine and gaining weight.  She had repeat CT scan of the chest performed recently.  I personally and independently reviewed the scan and discussed the result with the patient today. Her scan showed no concerning findings for disease recurrence or metastasis. I recommended for her to continue on observation with repeat CT scan of the chest in 6 months. The patient was advised to call immediately if she has any other concerning symptoms in the interval. Disclaimer: This note was dictated with voice recognition software. Similar sounding words can inadvertently be transcribed and may be missed upon review. Lajuana Matte, MD

## 2022-11-20 ENCOUNTER — Inpatient Hospital Stay: Payer: 59 | Attending: Physician Assistant | Admitting: Physician Assistant

## 2022-11-20 DIAGNOSIS — C3492 Malignant neoplasm of unspecified part of left bronchus or lung: Secondary | ICD-10-CM

## 2022-11-21 ENCOUNTER — Telehealth: Payer: Self-pay | Admitting: Physician Assistant

## 2023-03-09 ENCOUNTER — Telehealth: Payer: Self-pay | Admitting: Internal Medicine

## 2023-03-09 NOTE — Telephone Encounter (Signed)
 Rescheduled appointment per provider on call. Patient requested afternoon appointment and approved scheduled date and time.

## 2023-05-09 ENCOUNTER — Inpatient Hospital Stay: Payer: 59 | Attending: Nurse Practitioner

## 2023-05-09 ENCOUNTER — Telehealth: Payer: Self-pay

## 2023-05-09 NOTE — Telephone Encounter (Signed)
 Spoke with patient in regards to CT scan appt. Radiology department has been trying to reach her to schedule appt.  Spoke with patient and informed her that radiology will contact her soon for an appt.

## 2023-05-11 ENCOUNTER — Telehealth: Payer: Self-pay

## 2023-05-11 NOTE — Telephone Encounter (Signed)
 Patient called and LVM stating she wanted to confirm appts.   Tried to reach patient in regards to appts.  Radiology has been trying to reach her to schedule CT scan and LVM's for return calls.  Reached out to [facility] in radiology and scheduled CT scan for 5/6 @ 1 pm.  Scheduled labs on 5/6 @ 12 pm. When patient calls back, information will be provided to her.

## 2023-05-15 ENCOUNTER — Other Ambulatory Visit

## 2023-05-15 ENCOUNTER — Ambulatory Visit (HOSPITAL_COMMUNITY)

## 2023-05-16 ENCOUNTER — Ambulatory Visit: Payer: 59 | Admitting: Internal Medicine

## 2023-05-16 NOTE — Telephone Encounter (Signed)
 Late entry (05/14/2023)- patient called and LVM stating that she was not coming to her appts because she did not feel good.   Tried to reach patient to see how she was feeling and if she wanted to reschedule her CT scan.  LVM for return call.

## 2023-05-22 ENCOUNTER — Inpatient Hospital Stay: Payer: 59 | Attending: Nurse Practitioner | Admitting: Internal Medicine

## 2023-06-18 ENCOUNTER — Emergency Department (HOSPITAL_COMMUNITY)
Admission: EM | Admit: 2023-06-18 | Discharge: 2023-06-18 | Discharge status: Against Medical Advice | Attending: Emergency Medicine | Admitting: Emergency Medicine

## 2023-06-18 DIAGNOSIS — Z5321 Procedure and treatment not carried out due to patient leaving prior to being seen by health care provider: Secondary | ICD-10-CM | POA: Insufficient documentation

## 2023-06-18 DIAGNOSIS — G629 Polyneuropathy, unspecified: Secondary | ICD-10-CM | POA: Insufficient documentation

## 2023-06-18 DIAGNOSIS — M79605 Pain in left leg: Secondary | ICD-10-CM | POA: Diagnosis present

## 2023-06-18 NOTE — ED Triage Notes (Signed)
 Patient BIB from home with complaints of bilateral legs pain since this am, and not getting any better. Hx of Neuropathy. No other complaints other than leg pain.

## 2023-06-18 NOTE — Telephone Encounter (Signed)
 Called to check on pt. Pt did call 911 and waiting on them to arrive

## 2023-06-18 NOTE — ED Triage Notes (Signed)
 VS PER EMS  128/61 82 99% O2 2 [facility]  [facility]G 115

## 2023-06-19 NOTE — Telephone Encounter (Signed)
 Spoke with patient she did go to the hospital. She stated that she did feel better and they did nothing for her at the hospital. She said she love you and she was doing ok.

## 2023-07-02 NOTE — Telephone Encounter (Signed)
 I refilled her meclizine , but if she is having any other symptoms, to go to ED

## 2023-07-02 NOTE — Telephone Encounter (Addendum)
 Pt would like to get medication called in for vertigo. Pt says she just feels so dizzy. Pt says she has to hang on to the walls to go to the restroom.

## 2023-07-02 NOTE — Telephone Encounter (Signed)
 Left patient message that she has a medication sent into the pharmacy

## 2023-07-09 NOTE — Progress Notes (Signed)
 [address] - AMBULATORY [facility]  - FAMILY [facility]ICINE [facility] [address] [place] [address]    Date of service:  07/10/2023  Name:  Sherry [name]  Date of Birth:  03/09/1945   SUBJECTIVE   Patient ID: Sherry [name] is a 78 y.o. (DOB 11-Jan-1945) female  Chief Complaint  Patient presents with  . Follow-up  . congitive issues  Patient brother stated that she is not eating. She will eat cereal and go outside to feed the cats. She comes back into the house and lay down. She still smoking, very little per pt.   The patient was last seen 08/14/22. Hx of lung malignancy, low b12, underweight and CKD.   Brother states went to hospital 2 weeks ago but pt refused to stay due to sitting so long and back pain. Was concerned she had stroke.   Cognitive Issues Timeframe: 3 weeks or more Symptoms: impaired thinking Pt states takes 3 pills a day brother is not sure if she takes daily. Sometimes she says she can't recall if took meds not had b12 injections and states sometimes takes her vitamins,  Injury:denies falls. Walks few steps and then has to sit. Does not get in shower or bath. I wash off.  Hx: malignant lung, tx and 10/2022 CT clear. Due for fu and has not been back to her oncologist.   Lab Results  Component Value Date   CREATININE 1.48 (H) 08/14/2022  Renal function 30-40% range last visit States she is drinking water, but no appetite.  Had chicken yesterday, but brother says she did not eat it Doesn't like ensure  My feet hurt, burning, stinging Hx of low b12, low D.   Incontinence Timeframe: 3 weeks Symptoms: no burning, sometimes can't make it to bathroom No fever, no abd pain  Review of Systems  All other pertinent systems reviewed and are negative.  Social History[1]  The following portions of the patient's history were reviewed and updated as appropriate: allergies, current medications, past  family history, past medical history, past social history, past surgical history and problem list.  OBJECTIVE   Vitals:   07/10/23 1017  BP: 104/60  BP Location: Left arm  Patient Position: Sitting  Temp: 97 F (36.1 C)  TempSrc: Temporal  SpO2: (!) 88%  Weight: 23.6 kg (52 lb)  Height: 1.524 m (5')    Body mass index is 10.16 kg/m. Wt Readings from Last 3 Encounters:  07/10/23 23.6 kg (52 lb)  08/14/22 32.3 kg (71 lb 2 oz)  03/13/22 37.4 kg (82 lb 6.4 oz)     Constitutional: eye closed but responds and answers questions when entering room. Disheveled appearance. Emaciated appearance, chronically ill appearing. No acute distress. Oxygen on at 2 lpm , BMI 10, BP stable. Weight down 20 pounds from August Throat: dry appearance Hard of hearing Neck: supple, nontender, no nodes bil Respiratory.  bil wheezes bil, sat 88% with oxygen at 2 lpm.   Cardiovascular.   rate 100, rhythm regular  Exam reveals no gallop and no friction rub.  1/6 systolic murmur heard.   No lower extremity edema, 2+ peripheral pulses. Neuro: alert, oriented x 3, can spell world backwards 4/5, can recall 3/3 items, knows date, year.  CN 2-12 intact bil, no neuro deficits. Strength 2+ bil.  Musculoskeletal: gets up to walk with pushing up. Walks 5-6 steps and says, that's enough, gait is shuffled. Holds on to walk.  Skin: warm and dry with dry scaly skin.  Psych: flat affect, quiet, cooperative  ASSESSMENT/PLAN   Problem List Items Addressed This Visit     Chronic kidney disease, stage 3a (HCC)   Relevant Orders   CBC with Differential   Comprehensive Metabolic Panel   Other Visit Diagnoses       Pain in both lower extremities    -  Primary     Bilateral wheezing       Relevant Medications   ipratropium-albuteroL  (DUO-NEB) 0.5-2.5 mg/3 mL nebulizer solution 3 mL (Completed)     Weight loss       Relevant Orders   CBC with Differential   Comprehensive Metabolic Panel   POC Urinalysis Auto  without Microscopic (Completed)     Age-related physical debility         Low vitamin B12 level       Relevant Orders   Vitamin B12     Supplemental oxygen dependent         Underweight       Relevant Orders   Prealbumin     Malignant neoplasm of unspecified part of left bronchus or lung    (CMD)       10/2022 ct showed no reoccurence, brother given number to call to reschedule her oncology appt     Low vitamin D level       Relevant Orders   Vitamin D, 25-Hydroxy      Given duo neb in office, decreased wheezing, pt does not take deep breath. Sat 87-88 but she had taken oxygen off, difficult to get new readings due to pt condition.  Discussed with pt need to go to ED for eval due to weight loss, weakness, wheezing bil and low oxygen. Does not want to go to ED.  Agrees to go if labs are abnormal.  Brother is agreeable to take if worsening sx.  Encouraged to eat every 1-2 hours. Continue water intake Urine clear in office. Bp stable  Fu 4 weeks or sooner prn Refilled her nebulizer meds and new neb machine today.  Refilling her other meds as well for resp.  To fu with oncology, pulmonary.  Needs CT scan recheck. If pt doesn't go to hospital today, will have Home health come out.  Risks, benefits, and alternatives of the medication(s) and treatment plan(s) were discussed, and she expressed understanding. No barriers to treatment identified in this visit.   Return in about 4 weeks (around 08/07/2023).   I spent 40 +  minutes with patient; more than 50% of the time was spent counseling patient and arranging treatment.   Current Outpatient Medications  Medication Instructions  . albuterol  HFA (PROVENTIL  HFA;VENTOLIN  HFA;PROAIR  HFA) 90 mcg/actuation inhaler 2 puffs, inhalation, Every 6 hours PRN  . albuterol  2.5 mg, nebulization, Every 6 hours PRN  . back brace misc 1 each, miscellaneous, Daily PRN  . cyanocobalamin (VITAMIN B12) 1,000 mcg, intramuscular, Every 30 days  . ergocalciferol  (VITAMIN D2) 50,000 Units, oral, Weekly  . ergocalciferol, vitamin D2, (ergocalciferol, vit D2,, bulk,) powd Take  by mouth.  . HYDROcodone -acetaminophen  (NORCO) 5-325 mg per tablet 1 tablet, oral, Every 6 hours PRN  . hydrocortisone-acetic acid (VOSOL-HC) 1-2 % drop ear drops 3 drops, Each Ear, Daily PRN  . meclizine  (ANTIVERT ) 12.5 mg, oral, Every 12 hours PRN  . PARoxetine (PAXIL) 20 mg, oral, Every morning  . syringe with needle (BD Eclipse Luer-Lok) 3 mL 25 x 5/8 syrg 1 each, miscellaneous, Every 30  days  . tamsulosin  (FLOMAX ) 0.4 mg, oral, Daily  . torsemide  (DEMADEX ) 20 mg, oral, Daily  . Trelegy Ellipta  100-62.5-25 mcg inhaler 1 puff, inhalation, Daily   This document serves as a record of services personally performed by Santana Molt, FNP.  It was created on their behalf by Gwenlyn Cable, CMA, a trained medical scribe, and Certified Medical Assistant (CMA). During the course of documenting the history, physical exam and medical decision making, I was functioning as a Stage manager. The creation of this record is the provider's dictation and/or activities during the visit.   Electronically signed by: Gwenlyn Cable, CMA 07/09/2023 4:35 PM   I agree the documentation is accurate and complete.  Electronically signed by: Gwenlyn Cable, CMA 07/09/2023 4:33 PM   This document serves as a record of services personally performed by Santana Molt, FNP.  It was created on their behalf by Seldon GORMAN Ann, CMA, a trained medical scribe, and Certified Medical Assistant (CMA). During the course of documenting the history, physical exam and medical decision making, I was functioning as a Stage manager. The creation of this record is the provider's dictation and/or activities during the visit.  Electronically signed by Seldon GORMAN Ann, CMA 07/10/2023 8:57 AM         [1] Social History Tobacco Use  . Smoking status: Former    Current packs/day: 0.00    Types: Cigarettes    Quit date: 08/09/2017     Years since quitting: 5.9  . Smokeless tobacco: Never  Substance Use Topics  . Alcohol use: Yes    Alcohol/week: 8.3 standard drinks of alcohol  . Drug use: No

## 2023-07-10 NOTE — Telephone Encounter (Signed)
 Copied from CRM #[id number]. Topic: Schedule Appointment - Schedule Patient >> Jul 10, 2023  2:09 PM Sherry [name] wrote: Sherry [name] is calling to advise the patient is deceased (list date and time of death)   Include all details related to the request(s) below: Sherry [name]  (brother) called to let us  know and she got stiff after they got her home from our office and EMS worked on her but couldn't resuscitate her and passed away around 1:30 pm today.  [name] canceled her appointments.    Confirm and type the Best Contact Number below:  Patient/caller contact number:     [phone]        [] Home  [x] Mobile  [] Work [] Other   [x] Okay to leave a voicemail   Medication List:  Current Outpatient Medications:  .  albuterol  2.5 mg /3 mL (0.083 %) nebulizer solution, Take 2.5 mg by nebulization every 6 (six) hours as needed for wheezing or shortness of breath., Disp: 75 each, Rfl: 0 .  albuterol  HFA (PROVENTIL  HFA;VENTOLIN  HFA;PROAIR  HFA) 90 mcg/actuation inhaler, Inhale 2 puffs every 6 (six) hours as needed for wheezing or shortness of breath., Disp: 1 each, Rfl: 3 .  back brace misc, 1 each by miscellaneous route daily as needed., Disp: 1 each, Rfl: 0 .  cyanocobalamin (VITAMIN B12) 1,000 mcg/mL injection, Inject 1 mL (1,000 mcg total) into the muscle every 30 (thirty) days., Disp: 10 mL, Rfl: 2 .  ergocalciferol (VITAMIN D2) 1,250 mcg (50,000 unit) capsule, Take 1 capsule (50,000 Units total) by mouth once a week., Disp: 8 capsule, Rfl: 0 .  ergocalciferol, vitamin D2, (ergocalciferol, vit D2,, bulk,) powd, Take  by mouth., Disp: , Rfl:  .  HYDROcodone -acetaminophen  (NORCO) 5-325 mg per tablet, Take 1 tablet by mouth every 6 (six) hours as needed for moderate pain (4-6)., Disp: 20 tablet, Rfl: 0 .  hydrocortisone-acetic acid (VOSOL-HC) 1-2 % drop ear drops, Administer 3 drops into each ear daily as needed (itching ears)., Disp: 10 mL, Rfl: 5 .  meclizine  (ANTIVERT ) 12.5 mg tablet, Take 1 tablet  (12.5 mg total) by mouth every 12 (twelve) hours as needed for dizziness., Disp: 30 tablet, Rfl: 1 .  PARoxetine (PAXIL) 20 mg tablet, TAKE 1 TABLET(20 MG) BY MOUTH EVERY MORNING, Disp: 90 tablet, Rfl: 3 .  syringe with needle (BD Eclipse Luer-Lok) 3 mL 25 x 5/8 syrg, 1 each by miscellaneous route every 30 (thirty) days., Disp: 100 each, Rfl: 0 .  tamsulosin  (FLOMAX ) 0.4 mg cap, TAKE 1 CAPSULE(0.4 MG) BY MOUTH DAILY, Disp: 30 capsule, Rfl: 3 .  torsemide  (DEMADEX ) 20 mg tablet, TAKE 1 TABLET(20 MG) BY MOUTH DAILY, Disp: 30 tablet, Rfl: 2 .  Trelegy Ellipta  100-62.5-25 mcg inhaler, INHALE 1 PUFF INTO THE LUNGS DAILY, Disp: 60 each, Rfl: 3  Current Facility-Administered Medications:  .  cyanocobalamin (VITAMIN B12) injection 1,000 mcg, 1,000 mcg, intramuscular, Q30 Days, Santana Tarry Molt, FNP     Medication Request/Refills: Pharmacy Information (if applicable)   [x] Not Applicable       []  Pharmacy listed  Send Medication Request to:                                                 [] Pharmacy not listed (added to pharmacy list in Epic) Send Medication Request to:      Listed Pharmacies: [id number] -  [place], [place] - [address] AT [facility] & [address] - PHONE: [phone] - FAX: [phone]

## 2023-07-12 ENCOUNTER — Telehealth: Payer: Self-pay

## 2023-07-12 NOTE — Telephone Encounter (Signed)
(  Late Entry 07/17/2023)  Spoke with patient's brother. He reported that the patient passed away on 2023-07-23. Offered condolences on behalf of the care team.

## 2023-08-10 DEATH — deceased
